# Patient Record
Sex: Male | Born: 1965 | Race: Black or African American | Hispanic: No | Marital: Single
Health system: Midwestern US, Community
[De-identification: ages and names within clinical notes are randomized; demographics above are authoritative.]

## PROBLEM LIST (undated history)

## (undated) ENCOUNTER — Emergency Department (HOSPITAL_BASED_OUTPATIENT_CLINIC_OR_DEPARTMENT_OTHER): Admission: EM | Payer: No Typology Code available for payment source | Source: Home / Self Care

## (undated) ENCOUNTER — Emergency Department (HOSPITAL_BASED_OUTPATIENT_CLINIC_OR_DEPARTMENT_OTHER): Discharge status: Absent without leave | Payer: Medicare Other

## (undated) DIAGNOSIS — I2510 Atherosclerotic heart disease of native coronary artery without angina pectoris: Principal | ICD-10-CM

## (undated) DIAGNOSIS — K56609 Unspecified intestinal obstruction, unspecified as to partial versus complete obstruction: Secondary | ICD-10-CM

## (undated) DIAGNOSIS — K604 Rectal fistula, unspecified: Secondary | ICD-10-CM

## (undated) DIAGNOSIS — K219 Gastro-esophageal reflux disease without esophagitis: Secondary | ICD-10-CM

## (undated) DIAGNOSIS — M1990 Unspecified osteoarthritis, unspecified site: Secondary | ICD-10-CM

## (undated) DIAGNOSIS — I10 Essential (primary) hypertension: Secondary | ICD-10-CM

## (undated) DIAGNOSIS — F4310 Post-traumatic stress disorder, unspecified: Secondary | ICD-10-CM

## (undated) HISTORY — PX: RECTAL SURGERY: SHX760

## (undated) MED ORDER — CIPROFLOXACIN 500 MG TAB
500 mg | ORAL_TABLET | Freq: Two times a day (BID) | ORAL | Status: AC
Start: ? — End: 2013-04-28

## (undated) MED ORDER — KETOROLAC TROMETHAMINE 10 MG TAB
10 mg | ORAL_TABLET | Freq: Four times a day (QID) | ORAL | Status: DC | PRN
Start: ? — End: 2015-04-20

---

## 2007-05-29 DIAGNOSIS — E1169 Type 2 diabetes mellitus with other specified complication: Secondary | ICD-10-CM | POA: Insufficient documentation

## 2007-05-29 DIAGNOSIS — E119 Type 2 diabetes mellitus without complications: Secondary | ICD-10-CM | POA: Insufficient documentation

## 2007-05-29 DIAGNOSIS — E11621 Type 2 diabetes mellitus with foot ulcer: Secondary | ICD-10-CM | POA: Insufficient documentation

## 2008-03-29 ENCOUNTER — Emergency Department (HOSPITAL_BASED_OUTPATIENT_CLINIC_OR_DEPARTMENT_OTHER): Admission: EM | Admit: 2008-03-29 | Discharge: 2008-03-29 | Payer: Self-pay | Admitting: Emergency Medicine

## 2010-09-18 ENCOUNTER — Emergency Department (HOSPITAL_BASED_OUTPATIENT_CLINIC_OR_DEPARTMENT_OTHER)
Admission: EM | Admit: 2010-09-18 | Discharge: 2010-09-18 | Disposition: A | Discharge status: Home / Self Care | Payer: Medicare Other | Attending: Emergency Medicine | Admitting: Emergency Medicine

## 2010-09-18 DIAGNOSIS — R350 Frequency of micturition: Secondary | ICD-10-CM | POA: Insufficient documentation

## 2010-09-18 DIAGNOSIS — I10 Essential (primary) hypertension: Secondary | ICD-10-CM | POA: Insufficient documentation

## 2010-09-18 DIAGNOSIS — F17200 Nicotine dependence, unspecified, uncomplicated: Secondary | ICD-10-CM | POA: Insufficient documentation

## 2010-09-18 DIAGNOSIS — E1169 Type 2 diabetes mellitus with other specified complication: Secondary | ICD-10-CM | POA: Insufficient documentation

## 2010-09-18 DIAGNOSIS — G8929 Other chronic pain: Secondary | ICD-10-CM | POA: Insufficient documentation

## 2010-09-18 DIAGNOSIS — E860 Dehydration: Secondary | ICD-10-CM | POA: Insufficient documentation

## 2010-09-18 LAB — BASIC METABOLIC PANEL
BUN: 19 mg/dL (ref 6–23)
Calcium: 9 mg/dL (ref 8.4–10.5)
GFR calc non Af Amer: >60 mL/min (ref >60)
Glucose, Bld: 802 mg/dL (ref 70–99)

## 2010-09-18 LAB — POCT I-STAT 3, VENOUS BLOOD GAS (G3P V)
Acid-Base Excess: 1 mmol/L (ref 0.0–2.0)
pCO2, Ven: 49.7 mmHg (ref 45.0–50.0)
pH, Ven: 7.359 — ABNORMAL HIGH (ref 7.250–7.300)
pO2, Ven: 17 mmHg — CL (ref 30.0–45.0)

## 2010-09-18 LAB — GLUCOSE, CAPILLARY
Glucose-Capillary: >600 mg/dL (ref 70–99)
Glucose-Capillary: 269 mg/dL — ABNORMAL HIGH (ref 70–99)

## 2010-09-18 LAB — URINALYSIS, ROUTINE W REFLEX MICROSCOPIC
Bilirubin Urine: NEGATIVE
Glucose, UA: >1000 mg/dL — AB
Ketones, ur: 15 mg/dL — AB
Leukocytes, UA: NEGATIVE
Nitrite: NEGATIVE
Specific Gravity, Urine: 1.029 (ref 1.005–1.030)
pH: 6.5 (ref 5.0–8.0)

## 2010-09-18 LAB — CBC
Platelets: 224 10*3/uL (ref 150–400)
WBC: 5.4 10*3/uL (ref 4.0–10.5)

## 2010-09-18 LAB — URINE MICROSCOPIC-ADD ON

## 2010-09-18 LAB — DIFFERENTIAL
Basophils Absolute: 0.1 10*3/uL (ref 0.0–0.1)
Eosinophils Absolute: 0.2 10*3/uL (ref 0.0–0.7)
Lymphs Abs: 1.5 10*3/uL (ref 0.7–4.0)
Monocytes Absolute: 1 10*3/uL (ref 0.1–1.0)
Neutrophils Relative %: 48 % (ref 43–77)

## 2011-01-27 ENCOUNTER — Inpatient Hospital Stay (HOSPITAL_COMMUNITY)
Admission: EM | Admit: 2011-01-27 | Discharge: 2011-01-30 | DRG: 390 | Disposition: A | Discharge status: Home / Self Care | Payer: Medicare Other | Attending: Surgery | Admitting: Surgery

## 2011-01-27 ENCOUNTER — Emergency Department (INDEPENDENT_AMBULATORY_CARE_PROVIDER_SITE_OTHER): Payer: Medicare Other

## 2011-01-27 ENCOUNTER — Encounter: Payer: Self-pay | Admitting: *Deleted

## 2011-01-27 ENCOUNTER — Emergency Department (HOSPITAL_BASED_OUTPATIENT_CLINIC_OR_DEPARTMENT_OTHER)
Admission: EM | Admit: 2011-01-27 | Discharge: 2011-01-27 | Disposition: A | Discharge status: Other Acute Inpatient Hospital | Payer: Medicare Other | Source: Home / Self Care | Attending: Emergency Medicine | Admitting: Emergency Medicine

## 2011-01-27 DIAGNOSIS — E860 Dehydration: Secondary | ICD-10-CM | POA: Diagnosis present

## 2011-01-27 DIAGNOSIS — R112 Nausea with vomiting, unspecified: Secondary | ICD-10-CM

## 2011-01-27 DIAGNOSIS — K56609 Unspecified intestinal obstruction, unspecified as to partial versus complete obstruction: Principal | ICD-10-CM | POA: Diagnosis present

## 2011-01-27 DIAGNOSIS — E119 Type 2 diabetes mellitus without complications: Secondary | ICD-10-CM | POA: Diagnosis present

## 2011-01-27 DIAGNOSIS — F17200 Nicotine dependence, unspecified, uncomplicated: Secondary | ICD-10-CM | POA: Diagnosis present

## 2011-01-27 DIAGNOSIS — R109 Unspecified abdominal pain: Secondary | ICD-10-CM | POA: Insufficient documentation

## 2011-01-27 DIAGNOSIS — E876 Hypokalemia: Secondary | ICD-10-CM | POA: Diagnosis present

## 2011-01-27 DIAGNOSIS — G8929 Other chronic pain: Secondary | ICD-10-CM | POA: Diagnosis present

## 2011-01-27 DIAGNOSIS — K7689 Other specified diseases of liver: Secondary | ICD-10-CM

## 2011-01-27 DIAGNOSIS — M549 Dorsalgia, unspecified: Secondary | ICD-10-CM | POA: Diagnosis present

## 2011-01-27 DIAGNOSIS — R188 Other ascites: Secondary | ICD-10-CM

## 2011-01-27 DIAGNOSIS — I10 Essential (primary) hypertension: Secondary | ICD-10-CM | POA: Diagnosis present

## 2011-01-27 DIAGNOSIS — Z79899 Other long term (current) drug therapy: Secondary | ICD-10-CM

## 2011-01-27 HISTORY — DX: Gastro-esophageal reflux disease without esophagitis: K21.9

## 2011-01-27 HISTORY — DX: Post-traumatic stress disorder, unspecified: F43.10

## 2011-01-27 HISTORY — DX: Essential (primary) hypertension: I10

## 2011-01-27 LAB — COMPREHENSIVE METABOLIC PANEL
ALT: 21 U/L (ref 0–53)
Albumin: 4.2 g/dL (ref 3.5–5.2)
Alkaline Phosphatase: 62 U/L (ref 39–117)
Potassium: 3.2 mEq/L — ABNORMAL LOW (ref 3.5–5.1)
Sodium: 134 mEq/L — ABNORMAL LOW (ref 135–145)
Total Protein: 8.4 g/dL — ABNORMAL HIGH (ref 6.0–8.3)

## 2011-01-27 LAB — CBC
MCH: 32.4 pg (ref 26.0–34.0)
MCH: 32.3 pg (ref 26.0–34.0)
MCV: 86.8 fL (ref 78.0–100.0)
Platelets: 203 10*3/uL (ref 150–400)
Platelets: 164 10*3/uL (ref 150–400)
RBC: 6.48 MIL/uL — ABNORMAL HIGH (ref 4.22–5.81)
RBC: 5.75 MIL/uL (ref 4.22–5.81)

## 2011-01-27 LAB — GLUCOSE, CAPILLARY
Glucose-Capillary: 222 mg/dL — ABNORMAL HIGH (ref 70–99)
Glucose-Capillary: 189 mg/dL — ABNORMAL HIGH (ref 70–99)
Glucose-Capillary: 144 mg/dL — ABNORMAL HIGH (ref 70–99)

## 2011-01-27 LAB — URINALYSIS, ROUTINE W REFLEX MICROSCOPIC
Bilirubin Urine: NEGATIVE
Hgb urine dipstick: NEGATIVE
Specific Gravity, Urine: >1.046 — ABNORMAL HIGH (ref 1.005–1.030)
pH: 5 (ref 5.0–8.0)

## 2011-01-27 LAB — DIFFERENTIAL
Basophils Relative: 1 % (ref 0–1)
Eosinophils Absolute: 0 10*3/uL (ref 0.0–0.7)
Eosinophils Absolute: 0 10*3/uL (ref 0.0–0.7)
Eosinophils Relative: 0 % (ref 0–5)
Lymphs Abs: 2.3 10*3/uL (ref 0.7–4.0)
Lymphs Abs: 1.4 10*3/uL (ref 0.7–4.0)
Monocytes Absolute: 0.5 10*3/uL (ref 0.1–1.0)
Neutrophils Relative %: 73 % (ref 43–77)

## 2011-01-27 LAB — LACTIC ACID, PLASMA: Lactic Acid, Venous: 3.3 mmol/L — ABNORMAL HIGH (ref 0.5–2.2)

## 2011-01-27 LAB — URINE MICROSCOPIC-ADD ON

## 2011-01-27 MED ORDER — SODIUM CHLORIDE 0.9 % IV BOLUS (SEPSIS)
1000.0000 mL | Freq: Once | INTRAVENOUS | Status: AC
  Administered 2011-01-27: 1000 mL via INTRAVENOUS

## 2011-01-27 MED ORDER — HYDROMORPHONE HCL 2 MG/ML IJ SOLN
INTRAMUSCULAR | Status: AC
  Filled 2011-01-27: qty 1

## 2011-01-27 MED ORDER — PANTOPRAZOLE SODIUM 40 MG IV SOLR
INTRAVENOUS | Status: AC
  Filled 2011-01-27: qty 40

## 2011-01-27 MED ORDER — HYDROMORPHONE HCL 1 MG/ML IJ SOLN
1.0000 mg | Freq: Once | INTRAMUSCULAR | Status: AC
  Administered 2011-01-27: 1 mg via INTRAVENOUS

## 2011-01-27 MED ORDER — IOHEXOL 300 MG/ML  SOLN
100.0000 mL | Freq: Once | INTRAMUSCULAR | Status: AC | PRN
  Administered 2011-01-27: 100 mL via INTRAVENOUS

## 2011-01-27 MED ORDER — ONDANSETRON HCL 4 MG/2ML IJ SOLN
INTRAMUSCULAR | Status: AC
  Administered 2011-01-27: 4 mg
  Filled 2011-01-27: qty 2

## 2011-01-27 MED ORDER — POTASSIUM CHLORIDE CRYS ER 20 MEQ PO TBCR
40.0000 meq | EXTENDED_RELEASE_TABLET | Freq: Once | ORAL | Status: AC
  Administered 2011-01-27: 40 meq via ORAL
  Filled 2011-01-27: qty 2

## 2011-01-27 MED ORDER — HYDROMORPHONE HCL 1 MG/ML IJ SOLN
INTRAMUSCULAR | Status: AC
  Filled 2011-01-27: qty 1

## 2011-01-27 MED ORDER — SODIUM CHLORIDE 0.9 % IV SOLN: 999.0000 mL | Freq: Once | INTRAVENOUS | Status: DC

## 2011-01-27 MED ORDER — ONDANSETRON HCL 4 MG/2ML IJ SOLN
INTRAMUSCULAR | Status: AC
  Administered 2011-01-27: 4 mg via INTRAVENOUS
  Filled 2011-01-27: qty 2

## 2011-01-27 MED ORDER — HYDROMORPHONE HCL 1 MG/ML IJ SOLN: 1.0000 mg | Freq: Once | INTRAMUSCULAR | Status: DC

## 2011-01-27 MED ORDER — FAMOTIDINE IN NACL 20-0.9 MG/50ML-% IV SOLN
20.0000 mg | Freq: Once | INTRAVENOUS | Status: AC
  Administered 2011-01-27: 20 mg via INTRAVENOUS
  Filled 2011-01-27: qty 50

## 2011-01-27 MED ORDER — HYDROMORPHONE HCL 1 MG/ML IJ SOLN
2.0000 mg | Freq: Once | INTRAMUSCULAR | Status: AC
  Administered 2011-01-27: 2 mg via INTRAVENOUS

## 2011-01-27 MED ORDER — PANTOPRAZOLE SODIUM 40 MG IV SOLR
40.0000 mg | Freq: Once | INTRAVENOUS | Status: AC
  Administered 2011-01-27: 40 mg via INTRAVENOUS

## 2011-01-27 NOTE — ED Notes (Signed)
54F NG tube was placed into right nare approx ago without difficulty. Pt was hooked to low walk suction, and returned approx clear emesis. Secured to nose.

## 2011-01-27 NOTE — ED Notes (Signed)
PT's IV is intact at time of transfer. Charted it was removed for sake of documentation purposes.

## 2011-01-27 NOTE — ED Notes (Signed)
Report was given to [facility]. They have picked up pt and are now en route to [place] [facility]. Report given to Cletis [place] Consulting civil engineer at [employer]. Pt stable at time of transfer.

## 2011-01-27 NOTE — ED Notes (Signed)
NG tube was charted "removed" for documentation purposes only. 10F NG  Tube in right nare was in fact intact at time of transfer to [facility] ED.

## 2011-01-27 NOTE — ED Provider Notes (Addendum)
History     Chief Complaint  Patient presents with  . Abdominal Pain  . Emesis   Patient is a 45 y.o. male presenting with abdominal pain and vomiting. The history is provided by the patient and the EMS personnel.  Abdominal Pain The primary symptoms of the illness include abdominal pain, nausea and vomiting. The primary symptoms of the illness do not include fever, shortness of breath, diarrhea, hematemesis, hematochezia or dysuria. Episode onset: The patient reports he had been having generalized upper abdominal discomfort for approximately 2 months, waxing and waning course but "bearable" until last night when the patient states that the pain became "unbearable".  The onset of the illness was gradual (This evening about 45 minutes prior to arrival, the patient developed nausea and vomiting in addition to his more severe pain.). The problem has been gradually worsening.  The abdominal pain began more than 2 days ago. The pain came on gradually. The abdominal pain has been gradually worsening since its onset. The abdominal pain is located in the epigastric region. The abdominal pain does not radiate. The severity of the abdominal pain is 7/10. The abdominal pain is relieved by nothing. The abdominal pain is exacerbated by vomiting and alcohol use.  Nausea began today. The nausea is associated with alcohol use and prescription drugs.  The vomiting began today. Vomiting occurs 2 to 5 times per day. The emesis contains stomach contents.  Additional symptoms associated with the illness include anorexia and heartburn. Symptoms associated with the illness do not include chills, diaphoresis, constipation, urgency, hematuria, frequency or back pain. Significant associated medical issues include diabetes. Significant associated medical issues do not include PUD, GERD, inflammatory bowel disease, gallstones, liver disease or diverticulitis.  Emesis  Associated symptoms include abdominal pain. Pertinent  negatives include no chills, no diarrhea, no fever and no headaches.    No past medical history on file.  No past surgical history on file.  No family history on file.  History  Substance Use Topics  . Smoking status: Not on file  . Smokeless tobacco: Not on file  . Alcohol Use: Not on file      Review of Systems  Constitutional: Negative for fever, chills and diaphoresis.  HENT: Negative.   Eyes: Negative.   Respiratory: Negative.  Negative for shortness of breath.   Cardiovascular: Negative.   Gastrointestinal: Positive for heartburn, nausea, vomiting, abdominal pain and anorexia. Negative for diarrhea, constipation, blood in stool, hematochezia, abdominal distention, anal bleeding, rectal pain and hematemesis.  Genitourinary: Negative for dysuria, urgency, frequency and hematuria.  Musculoskeletal: Negative.  Negative for back pain.  Skin: Negative.   Neurological: Negative.  Negative for headaches.    Physical Exam  BP 138/111  Pulse 104  Temp(Src) 98.1 F (36.7 C) (Oral)  Resp 18  SpO2 100%  Physical Exam  Nursing note and vitals reviewed. Constitutional: He is oriented to person, place, and time. He appears well-developed and well-nourished. He appears distressed.  HENT:  Head: Normocephalic and atraumatic.  Nose: Nose normal.  Mouth/Throat: Oropharynx is clear and moist.  Eyes: Conjunctivae and EOM are normal. Pupils are equal, round, and reactive to light. No scleral icterus.  Neck: Normal range of motion. Neck supple.  Cardiovascular: Normal rate, regular rhythm, normal heart sounds and intact distal pulses.  Exam reveals no gallop and no friction rub.   No murmur heard. Pulmonary/Chest: Effort normal and breath sounds normal. No respiratory distress. He has no wheezes. He has no rales. He exhibits no tenderness.  Abdominal: Soft. Normal appearance and bowel sounds are normal. He exhibits no distension, no fluid wave, no abdominal bruit, no ascites, no  pulsatile midline mass and no mass. There is no hepatosplenomegaly. There is tenderness in the epigastric area and periumbilical area. There is guarding. There is no rigidity, no rebound, no CVA tenderness, no tenderness at McBurney's point and negative Murphy's sign.  Musculoskeletal: Normal range of motion. He exhibits no edema and no tenderness.  Neurological: He is alert and oriented to person, place, and time. No cranial nerve deficit. He exhibits normal muscle tone. Coordination normal.  Skin: Skin is warm and dry. No rash noted. He is not diaphoretic. No erythema.  Psychiatric: He has a normal mood and affect.    ED Course  Procedures  MDM Gastritis, peptic ulcer, pancreatitis, cholecystitis, cholelithiasis, choledocholithiasis, biliary colic, bowel obstruction, diverticulitis, colitis, appendicitis, urinary tract infection, renal colic, ureteral lithiasis considered among other potential etiologies in the patient's differential diagnosis.  I will obtain general metabolic labs as well as hepatic function panel and lipase, urinalysis, and due to the duration and severity of his symptoms I will also obtain an abdominal CT scan with contrast. I have ordered the patient IV fluids, it and time medics, antacids, and analgesics. I will reevaluate the patient for improvement or change in symptoms.     Felisa Bonier, MD 01/27/11 [phone]  I went into the patient's room to reevaluate him, and he seems to be in much improved condition, with significant relief of his pain. We talked about his use of alcohol which his wife has informed me as excessive, nearly daily, and fairly heavy. I pointed out to him that my suspicion was that his abdominal pain was related to inflammation of the stomach and/or pancreas from chronic alcohol use and that this alcohol use has directly resulted in these symptoms today. I recommended to him to cut down on his use of alcohol. The patient stated his understanding and  appreciation of what I was saying, and tells me that he is currently getting help with his alcohol use.  Felisa Bonier, MD 01/27/11 0320  The patient's initial lab value results were reviewed and surprisingly his liver transaminases and his lipase are not significantly elevated. However too remarkable findings were a hemoglobin of 21 g/dL (I have ordered a repeat blood draw to confirm this value, as the differential is otherwise normal and does not immediately makes sense why his hemoglobin would be this high) as well as his bicarbonate being low, it and for which reason I have ordered a lactate level to evaluate for possible mesenteric ischemia which will also be evaluated by abdominal CT scan.  Felisa Bonier, MD 01/27/11 [phone]  Repeat CBC for the patient shows a declining hemoglobin towards normal range with IV fluid hydration. It appears the patient was significantly dehydrated. Additionally, if it's noted that his lactic acid is elevated at 3.3 along with a carbon dioxide level in the blood of 16. His CT scan is completed and I have reviewed along with the radiologist, and it appears that the patient has an acute bowel obstruction in the left upper quadrant with dilated loops of small bowel with bowel wall edema. I contacted Dr. Adriana Reams at [facility], the on-call general surgeon and discussed the findings on the patient's exam lab work and CT scan with him. At this time he has accepted the patient for transfer to the [facility] emergency Department where he will evaluate the  patient. I have informed the patient and his wife of the findings and diagnosis as well as the treatment plan including transfer to [facility] and he states his understanding of and agreement with this plan of care.  Felisa Bonier, MD 01/27/11 [phone]

## 2011-01-27 NOTE — ED Notes (Signed)
Pt reports sharp mid-upper abdominal pains with nausea since yesterday. Worsened approx 3hours ago. Pt admits to drinking a couple vodka and cranberry's with dinner last night.

## 2011-01-28 ENCOUNTER — Inpatient Hospital Stay (HOSPITAL_COMMUNITY): Payer: Medicare Other

## 2011-01-28 LAB — GLUCOSE, CAPILLARY
Glucose-Capillary: 131 mg/dL — ABNORMAL HIGH (ref 70–99)
Glucose-Capillary: 146 mg/dL — ABNORMAL HIGH (ref 70–99)
Glucose-Capillary: 156 mg/dL — ABNORMAL HIGH (ref 70–99)
Glucose-Capillary: 157 mg/dL — ABNORMAL HIGH (ref 70–99)
Glucose-Capillary: 176 mg/dL — ABNORMAL HIGH (ref 70–99)

## 2011-01-28 LAB — BASIC METABOLIC PANEL WITH GFR
BUN: 11 mg/dL (ref 6–23)
CO2: 28 meq/L (ref 19–32)
Calcium: 8.9 mg/dL (ref 8.4–10.5)
Chloride: 99 meq/L (ref 96–112)
Creatinine, Ser: 0.9 mg/dL (ref 0.50–1.35)
GFR calc Af Amer: >60 mL/min
GFR calc non Af Amer: >60 mL/min
Glucose, Bld: 123 mg/dL — ABNORMAL HIGH (ref 70–99)
Potassium: 3.4 meq/L — ABNORMAL LOW (ref 3.5–5.1)
Sodium: 138 meq/L (ref 135–145)

## 2011-01-28 LAB — CBC
HCT: 45.3 % (ref 39.0–52.0)
Hemoglobin: 16.5 g/dL (ref 13.0–17.0)
MCH: 33 pg (ref 26.0–34.0)
MCV: 90.6 fL (ref 78.0–100.0)
RBC: 5 MIL/uL (ref 4.22–5.81)

## 2011-01-29 ENCOUNTER — Inpatient Hospital Stay (HOSPITAL_COMMUNITY): Payer: Medicare Other

## 2011-01-29 LAB — GLUCOSE, CAPILLARY: Glucose-Capillary: 111 mg/dL — ABNORMAL HIGH (ref 70–99)

## 2011-01-30 LAB — GLUCOSE, CAPILLARY: Glucose-Capillary: 125 mg/dL — ABNORMAL HIGH (ref 70–99)

## 2011-01-31 LAB — GLUCOSE, CAPILLARY

## 2011-01-31 NOTE — Discharge Summary (Signed)
  NAME:  [name], [name] NO.:  [id number]  MEDICAL RECORD NO.:  [id number]  LOCATION:  3037                         FACILITY:  [facility]  PHYSICIAN:  Maisie Fus A. Atia Haupt, M.D.DATE OF BIRTH:  1966/04/05  DATE OF ADMISSION:  01/27/2011 DATE OF DISCHARGE:  01/30/2011                              DISCHARGE SUMMARY   ADMITTING DIAGNOSIS:  Partial small bowel obstruction discharged 01/02 hypertension.  DISCHARGE DIAGNOSES:  Same.  PROCEDURES PERFORMED:  Small bowel follow-through and CT scan of the pelvis.  BRIEF HISTORY:  The patient is a 45 year old male admitted with nausea, vomiting, and crampy abdominal pain.  Films showed small bowel obstruction.  Please see history and physical for details.  HOSPITAL COURSE:  He underwent small bowel follow-through on hospital day #1 which was essentially normal.  His bowels began to move again and his nausea resolved and his abdominal pain resolved.  His laboratory work was normal.  There was no obvious obstruction seen on small bowel follow-through with rapid contrast flow through the small bowel into the large bowel without signs of stricture.  He was feeling better and was discharged to home hospital day #3 after tolerating his diet.  He had some issues with hypertension, but this improved when we resumed his medications and got the NG tube out of him.  He should follow up with the [facility] physician.  DISCHARGE INSTRUCTIONS:  He will follow up with [facility] as needed.  I have recommended he go back to his primary care doctor who currently is the [facility] hospital for further medical management of his hypertension as well as other issues.  He would like to find primary care doctor, but I told [facility]. He may require gastroenterologist to see him as well.  At this point in time, we found no point of obstruction.  He has had no previous surgery and he has no region of adhesions of this  point in time.  His small bowel follow-through was normal.  After the discussion of the above with the patient and his wife and they understood.  I recommended a soft diet over the next 2 weeks.  He is to drink plenty of fluids, eat small meals, and do not overeat.  He will follow up with [facility] as needed.  CONDITION ON DISCHARGE:  Improved.     Taysean Wager A. Diahn Waidelich, M.D.     TAC/MEDQ  D:  01/30/2011  T:  01/30/2011  Job:  [id number]  Electronically Signed by Harriette Bouillon M.D. on 01/31/2011 12:49:03 PM
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
## 2011-03-08 ENCOUNTER — Encounter (HOSPITAL_BASED_OUTPATIENT_CLINIC_OR_DEPARTMENT_OTHER): Payer: Self-pay

## 2011-03-08 ENCOUNTER — Emergency Department (HOSPITAL_BASED_OUTPATIENT_CLINIC_OR_DEPARTMENT_OTHER)
Admission: EM | Admit: 2011-03-08 | Discharge: 2011-03-09 | Disposition: A | Discharge status: Home / Self Care | Payer: Medicare Other | Source: Home / Self Care | Attending: Emergency Medicine | Admitting: Emergency Medicine

## 2011-03-08 ENCOUNTER — Emergency Department (INDEPENDENT_AMBULATORY_CARE_PROVIDER_SITE_OTHER): Payer: Medicare Other

## 2011-03-08 DIAGNOSIS — E119 Type 2 diabetes mellitus without complications: Secondary | ICD-10-CM | POA: Insufficient documentation

## 2011-03-08 DIAGNOSIS — I10 Essential (primary) hypertension: Secondary | ICD-10-CM | POA: Insufficient documentation

## 2011-03-08 DIAGNOSIS — K6289 Other specified diseases of anus and rectum: Secondary | ICD-10-CM

## 2011-03-08 DIAGNOSIS — K612 Anorectal abscess: Secondary | ICD-10-CM

## 2011-03-08 DIAGNOSIS — K611 Rectal abscess: Secondary | ICD-10-CM

## 2011-03-08 DIAGNOSIS — Q619 Cystic kidney disease, unspecified: Secondary | ICD-10-CM

## 2011-03-08 HISTORY — DX: Unspecified intestinal obstruction, unspecified as to partial versus complete obstruction: K56.609

## 2011-03-08 HISTORY — DX: Rectal fistula, unspecified: K60.40

## 2011-03-08 HISTORY — DX: Rectal fistula: K60.4

## 2011-03-08 LAB — CBC
HCT: 39.5 % (ref 39.0–52.0)
Hemoglobin: 14.4 g/dL (ref 13.0–17.0)
MCHC: 36.5 g/dL — ABNORMAL HIGH (ref 30.0–36.0)
WBC: 7.9 10*3/uL (ref 4.0–10.5)

## 2011-03-08 LAB — COMPREHENSIVE METABOLIC PANEL
Alkaline Phosphatase: 52 U/L (ref 39–117)
BUN: 10 mg/dL (ref 6–23)
CO2: 26 mEq/L (ref 19–32)
Chloride: 99 mEq/L (ref 96–112)
GFR calc Af Amer: >60 mL/min (ref >60)
GFR calc non Af Amer: >60 mL/min (ref >60)
Glucose, Bld: 126 mg/dL — ABNORMAL HIGH (ref 70–99)
Potassium: 4 mEq/L (ref 3.5–5.1)
Total Bilirubin: 0.8 mg/dL (ref 0.3–1.2)

## 2011-03-08 LAB — URINALYSIS, ROUTINE W REFLEX MICROSCOPIC
Ketones, ur: NEGATIVE mg/dL
Leukocytes, UA: NEGATIVE
Nitrite: NEGATIVE
Protein, ur: 30 mg/dL — AB

## 2011-03-08 MED ORDER — HYDROMORPHONE HCL 1 MG/ML IJ SOLN
1.0000 mg | Freq: Once | INTRAMUSCULAR | Status: AC
  Administered 2011-03-08: 1 mg via INTRAVENOUS

## 2011-03-08 MED ORDER — IOHEXOL 300 MG/ML  SOLN
100.0000 mL | Freq: Once | INTRAMUSCULAR | Status: AC | PRN
  Administered 2011-03-08: 100 mL via INTRAVENOUS

## 2011-03-08 MED ORDER — HYDROMORPHONE HCL 1 MG/ML IJ SOLN
1.0000 mg | Freq: Once | INTRAMUSCULAR | Status: AC
  Administered 2011-03-08 (×2): 1 mg via INTRAVENOUS
  Filled 2011-03-08: qty 1

## 2011-03-08 MED ORDER — HYDROMORPHONE HCL 1 MG/ML IJ SOLN
INTRAMUSCULAR | Status: AC
  Filled 2011-03-08: qty 1

## 2011-03-08 MED ORDER — HYDROMORPHONE HCL 1 MG/ML IJ SOLN
INTRAMUSCULAR | Status: AC
  Administered 2011-03-08: 1 mg via INTRAVENOUS
  Filled 2011-03-08: qty 1

## 2011-03-08 MED ORDER — SODIUM CHLORIDE 0.9 % IV SOLN
3.0000 g | Freq: Once | INTRAVENOUS | Status: AC
  Administered 2011-03-09: 3 g via INTRAVENOUS
  Filled 2011-03-08: qty 3

## 2011-03-08 MED ORDER — HYDROMORPHONE HCL 1 MG/ML IJ SOLN: 1.0000 mg | Freq: Once | INTRAMUSCULAR | Status: DC

## 2011-03-08 NOTE — ED Provider Notes (Signed)
History     CSN: [id number] Arrival date & time: 03/08/2011  7:05 PM  Chief Complaint  Patient presents with  . Rectal Pain   The history is provided by the patient. No language interpreter was used.  Seen at the [facility] for same 2 days ago symptoms worsening.  Pain is a 10/10, sharp, constant, worsening,  non radiating.  No f/c/r.  No rectal bleeding.  No n/v/d.  Has a perirectal abscess.  No drainage.  No Associated sx.  No radiation.  No trauma.    Past Medical History  Diagnosis Date  . Hypertension   . Diabetes mellitus   . GERD (gastroesophageal reflux disease)   . PTSD (post-traumatic stress disorder)   . Fistula, perirectal   . Small bowel obstruction     History reviewed. No pertinent past surgical history.  No family history on file.  History  Substance Use Topics  . Smoking status: Former Games developer  . Smokeless tobacco: Not on file  . Alcohol Use: Yes     drinks heavily approx 5days a week.      Review of Systems  Constitutional: Negative for activity change and appetite change.  HENT: Negative for facial swelling.   Eyes: Negative for discharge.  Respiratory: Negative for apnea.   Cardiovascular: Negative for chest pain.  Gastrointestinal: Positive for rectal pain. Negative for nausea, vomiting, diarrhea, constipation, blood in stool, abdominal distention and anal bleeding.  Genitourinary: Positive for flank pain. Negative for dysuria and difficulty urinating.  Neurological: Negative for dizziness.  Hematological: Negative.   Psychiatric/Behavioral: Negative.     Physical Exam  BP 155/105  Pulse 91  Temp(Src) 98.3 F (36.8 C) (Oral)  Resp 20  Ht 5\' 9"  (1.753 m)  Wt 200 lb (90.719 kg)  BMI 29.53 kg/m2  SpO2 99%  Physical Exam  Constitutional: He is oriented to person, place, and time. He appears well-developed and well-nourished.  HENT:  Head: Normocephalic and atraumatic.  Eyes: EOM are normal. Pupils are equal, round, and reactive to light.  Neck:  Normal range of motion. Neck supple.  Cardiovascular: Normal rate and regular rhythm.   Pulmonary/Chest: Effort normal and breath sounds normal.  Abdominal: Soft. Bowel sounds are normal.       Abscess through the anal sphincter with bulge at 7-9 oclock position within the rectal vault  Musculoskeletal: Normal range of motion.  Neurological: He is alert and oriented to person, place, and time.  Skin: Skin is warm and dry.  Psychiatric: He has a normal mood and affect.    ED Course  Procedures  MDM Case d/w Dr. Silvano Rusk, antibiotics and transfer to [facility] Case d/w Dr. Brock Bad     Jasmine Awe, MD 03/09/11 [phone]

## 2011-03-08 NOTE — ED Notes (Signed)
C/o rectal pain-dx with fistula-was seen at the [place] 2 days ago

## 2011-03-09 ENCOUNTER — Observation Stay (HOSPITAL_COMMUNITY)
Admission: EM | Admit: 2011-03-09 | Discharge: 2011-03-10 | Disposition: A | Discharge status: Home / Self Care | Payer: Medicare Other | Attending: Surgery | Admitting: Surgery

## 2011-03-09 DIAGNOSIS — K612 Anorectal abscess: Principal | ICD-10-CM | POA: Insufficient documentation

## 2011-03-09 DIAGNOSIS — Z79899 Other long term (current) drug therapy: Secondary | ICD-10-CM | POA: Insufficient documentation

## 2011-03-09 DIAGNOSIS — E119 Type 2 diabetes mellitus without complications: Secondary | ICD-10-CM | POA: Insufficient documentation

## 2011-03-09 DIAGNOSIS — I10 Essential (primary) hypertension: Secondary | ICD-10-CM | POA: Insufficient documentation

## 2011-03-09 LAB — GLUCOSE, CAPILLARY
Glucose-Capillary: 121 mg/dL — ABNORMAL HIGH (ref 70–99)
Glucose-Capillary: 138 mg/dL — ABNORMAL HIGH (ref 70–99)

## 2011-03-09 MED ORDER — HYDROMORPHONE HCL 1 MG/ML IJ SOLN
1.0000 mg | Freq: Once | INTRAMUSCULAR | Status: AC
  Administered 2011-03-09: 1 mg via INTRAVENOUS
  Filled 2011-03-09: qty 1

## 2011-03-12 LAB — CULTURE, ROUTINE-ABSCESS

## 2011-03-13 NOTE — H&P (Signed)
NAME:  Christian, [name].:  [id number]  MEDICAL RECORD NO.:  [id number]  LOCATION:  [facility]                         FACILITY:  [facility]  PHYSICIAN:  Velora Heckler, MD      DATE OF BIRTH:  01/26/1966  DATE OF ADMISSION:  03/09/2011 DATE OF DISCHARGE:                             HISTORY & PHYSICAL   REFERRING PHYSICIAN:  April Palumbo-Rasch, MD, [facility].  CHIEF COMPLAINT:  Perirectal abscess.  HISTORY OF PRESENT ILLNESS:  [name] is a 45 year old black male, recently relocated from [place], [place] to [place], [place].  Patient presents with 5-day history of perianal and perirectal pain.  He has experienced sweats.  He was evaluated 2 days ago at the [facility].  He was treated for hemorrhoids.  Patient presented with continued pain at [facility] this evening.  He was evaluated and noted to have a fluctuant mass near the rectum.  CT scan of abdomen and pelvis was obtained, which confirmed a 4.8 cm abscess.  Patient was referred to General Surgery for further evaluation and management.  PAST MEDICAL HISTORY: 1. Type 2 diabetes. 2. Hypertension.  MEDICATIONS:  Metformin, glipizide, lisinopril.  ALLERGIES:  No known drug allergies.  REVIEW OF SYSTEMS:  Patient notes sleep apnea on CPAP, posttraumatic stress disorder.  FAMILY HISTORY:  Noncontributory.  SOCIAL HISTORY:  Patient recently relocated to [place].  He smokes cigarettes.  He notes moderate alcohol use.  He denies drug use.  PHYSICAL EXAMINATION:  GENERAL:  A 45 year old, well-developed, well- nourished black male on a stretcher in the emergency department, mild to moderate discomfort. VITAL SIGNS:  Temperature 98.3, pulse 91, respirations 20, blood pressure 155/105, O2 saturation 99% room air.  HEENT:  Shows him to be normocephalic.  Sclerae clear.  Dentition fair.  Mucous membranes moist. Voice normal. NECK:   Palpation of the neck shows no thyroid nodularity, no lymphadenopathy, no mass, no tenderness. LUNGS:  Clear to auscultation bilaterally without rales, rhonchi, or wheeze. CARDIAC EXAM:  Shows regular rate and rhythm without significant murmur. ABDOMEN:  Soft, protuberant, with no tenderness. GENITOURINARY EXAM:  Shows a fluctuant tender mass with induration in the posterior midline.  There is no drainage.  There is surrounding induration and early signs of cellulitis. EXTREMITIES:  Nontender without edema. NEUROLOGICAL:  Patient is alert and oriented without focal deficit.  LABORATORY STUDIES:  White count 7.9, hemoglobin 14.4, platelet count 210,000.  Glucose 126, creatinine 0.70.  Electrolytes are normal.  RADIOGRAPHIC STUDIES:  CT scan of abdomen and pelvis demonstrating a 4.8 cm perirectal abscess.  IMPRESSION: 1. Perirectal abscess. 2. Type 2 diabetes. 3. Hypertension. 4. Sleep apnea.  PLAN:  Patient will be admitted on the General Surgical Service.  He will be maintained n.p.o. in anticipation of surgery later this morning. Patient will be given intravenous pain medication as needed.  Unasyn has been started in the emergency department and will be continued.  Patient will be prepared and taken to the operating room for incision and drainage of perirectal abscess later today.  I have discussed this procedure with the patient at length.  He and his significant other understand  and agreed to proceed.     Velora Heckler, MD     TMG/MEDQ  D:  03/09/2011  T:  03/09/2011  Job:  [id number]  Electronically Signed by Darnell Level MD on 03/13/2011 09:03:08 AM

## 2011-03-16 LAB — ANAEROBIC CULTURE

## 2011-03-16 NOTE — Op Note (Signed)
  NAME:  EARNIE, BECHARD NO.:  [id number]  MEDICAL RECORD NO.:  [id number]  LOCATION:  1536                         FACILITY:  [facility]  PHYSICIAN:  Abigail Miyamoto, M.D. DATE OF BIRTH:  Feb 13, 1966  DATE OF PROCEDURE: DATE OF DISCHARGE:                              OPERATIVE REPORT   PREOPERATIVE DIAGNOSIS:  Perirectal abscess.  POSTOPERATIVE DIAGNOSIS:  Perirectal abscess.  PROCEDURE:  Incision and drainage of perirectal abscess.  SURGEON:  Shelly Rubenstein, MD  ANESTHESIA:  General and 0.50% Marcaine.  ESTIMATED BLOOD LOSS:  Minimal.  PROCEDURE IN DETAIL:  The patient was brought to the operating room, identified as Blake Divine.  He was placed supine on the operating table and general anesthesia was induced.  The patient was placed in the lithotomy position.  The obvious area of fluctuance in the abscess was almost at the posterior midline.  After prepping and draping him in the usual sterile fashion, I made an incision with a scalpel and entered the abscess cavity.  There was clear, thin fluid in the cavity itself. Cultures were obtained of the cavity.  I probed deeply into the cavity and it did not appear to extend anywhere else.  I did a circumferential inspection of the anus and found no communication in the anal cavity.  I then achieved hemostasis with cautery.  I anesthetized the area circumferentially with 0.50% Marcaine.  I then packed the wound with a 4x4 gauze.  Dry gauze and tape were then placed over this.  The patient tolerated the procedure well.  All counts were correct at the end of the procedure.  The patient was then extubated in the operating room and taken in stable condition to recovery room.     Abigail Miyamoto, M.D.     DB/MEDQ  D:  03/09/2011  T:  03/09/2011  Job:  [id number]  Electronically Signed by Abigail Miyamoto M.D. on 03/16/2011 08:24:33 AM

## 2011-03-19 NOTE — Discharge Summary (Signed)
  NAME:  [name], [name] NO.:  [id number]  MEDICAL RECORD NO.:  [id number]  LOCATION:  1536                         FACILITY:  [facility]  PHYSICIAN:  Sandria Bales. Ezzard Standing, M.D.  DATE OF BIRTH:  10/19/1965  DATE OF ADMISSION:  03/09/2011 DATE OF DISCHARGE:  03/10/2011                              DISCHARGE SUMMARY   DISCHARGE DIAGNOSES: 1. Perianal abscess. 2. Hypertension. 3. Diabetes mellitus.  OPERATION PERFORMED:  The patient underwent an incision and drainage of perianal abscess by Dr. Abigail Miyamoto, on March 09, 2011.  HISTORY OF ILLNESS:  [name] is a 45 year old African American male, who recently relocated from ___???____ [place].  The patient is on chronic disability.  He is a former post Film/video editor.  He is on disability for PTSD and back pain.    He was evaluated a couple of days prior to admission at the [facility] where he usually gets his treatment, was found to have hemorrhoids; however, because of continued pain, presented to the [facility], the evening of March 08, 2011.  He was noted to have a fluctuant mass.  The CT scan suggested a 4.8-cm perianal abscess, and he was referred to our service at [facility] where he was seen by Dr. Darnell Level.  PAST MEDICAL HISTORY:  Significant for type 2 diabetes, for which he is on metformin and glipizide.  He had hypertension, on lisinopril.  He also has sleep apnea, on a CPAP, and he has a history of posttraumatic stress disease.  He was taken to the operating room by Dr. Magnus Ivan on March 09, 2011, where he underwent incision and drainage of perianal/perirectal abscess.  He was then placed on Unasyn as antibiotic.  He is now 1 day postop, feeling better, and ready for discharge.  His incision looks good.  DISCHARGE INSTRUCTIONS:   Will include resuming his home medications.   He should be on a diabetic diet.   He is to see Dr. Magnus Ivan back in  2 weeks for wound recheck.   I have instructed him to do sitz bath three times a day.   He is given Septra DS 1 tablet twice a day as an antibiotic.  I gave him Vicodin 5/325, a total of 20 tablets with one refill for pain and his discharge condition is good.   Sandria Bales. Ezzard Standing, M.D., FAVS    DHN/MEDQ  D:  03/10/2011  T:  03/10/2011  Job:  [id number]  Electronically Signed by Ovidio Kin M.D. on 03/19/2011 11:08:36 AM

## 2011-05-30 ENCOUNTER — Emergency Department (HOSPITAL_BASED_OUTPATIENT_CLINIC_OR_DEPARTMENT_OTHER)
Admission: EM | Admit: 2011-05-30 | Discharge: 2011-05-30 | Disposition: A | Discharge status: Home / Self Care | Payer: Medicare Other | Attending: Emergency Medicine | Admitting: Emergency Medicine

## 2011-05-30 ENCOUNTER — Encounter (HOSPITAL_BASED_OUTPATIENT_CLINIC_OR_DEPARTMENT_OTHER): Payer: Self-pay | Admitting: *Deleted

## 2011-05-30 DIAGNOSIS — I10 Essential (primary) hypertension: Secondary | ICD-10-CM | POA: Insufficient documentation

## 2011-05-30 DIAGNOSIS — Z79899 Other long term (current) drug therapy: Secondary | ICD-10-CM | POA: Insufficient documentation

## 2011-05-30 DIAGNOSIS — Z8739 Personal history of other diseases of the musculoskeletal system and connective tissue: Secondary | ICD-10-CM | POA: Insufficient documentation

## 2011-05-30 DIAGNOSIS — E1169 Type 2 diabetes mellitus with other specified complication: Secondary | ICD-10-CM | POA: Insufficient documentation

## 2011-05-30 DIAGNOSIS — K219 Gastro-esophageal reflux disease without esophagitis: Secondary | ICD-10-CM | POA: Insufficient documentation

## 2011-05-30 DIAGNOSIS — R739 Hyperglycemia, unspecified: Secondary | ICD-10-CM

## 2011-05-30 HISTORY — DX: Unspecified osteoarthritis, unspecified site: M19.90

## 2011-05-30 LAB — URINALYSIS, ROUTINE W REFLEX MICROSCOPIC
Glucose, UA: >1000 mg/dL — AB
Leukocytes, UA: NEGATIVE
Nitrite: NEGATIVE
Specific Gravity, Urine: 1.026 (ref 1.005–1.030)
pH: 5 (ref 5.0–8.0)

## 2011-05-30 LAB — CBC
Hemoglobin: 15.2 g/dL (ref 13.0–17.0)
MCH: 31.5 pg (ref 26.0–34.0)
MCHC: 37.3 g/dL — ABNORMAL HIGH (ref 30.0–36.0)
MCV: 84.3 fL (ref 78.0–100.0)

## 2011-05-30 LAB — COMPREHENSIVE METABOLIC PANEL
Albumin: 4.3 g/dL (ref 3.5–5.2)
Alkaline Phosphatase: 83 U/L (ref 39–117)
BUN: 25 mg/dL — ABNORMAL HIGH (ref 6–23)
Calcium: 9.9 mg/dL (ref 8.4–10.5)
Creatinine, Ser: 1 mg/dL (ref 0.50–1.35)
GFR calc Af Amer: >90 mL/min (ref >90)
Glucose, Bld: 508 mg/dL — ABNORMAL HIGH (ref 70–99)
Potassium: 4 mEq/L (ref 3.5–5.1)
Total Protein: 8.2 g/dL (ref 6.0–8.3)

## 2011-05-30 LAB — GLUCOSE, CAPILLARY
Glucose-Capillary: 599 mg/dL (ref 70–99)
Glucose-Capillary: 372 mg/dL — ABNORMAL HIGH (ref 70–99)

## 2011-05-30 LAB — DIFFERENTIAL
Basophils Relative: 1 % (ref 0–1)
Eosinophils Absolute: 0.1 10*3/uL (ref 0.0–0.7)
Eosinophils Relative: 3 % (ref 0–5)
Monocytes Relative: 8 % (ref 3–12)
Neutrophils Relative %: 42 % — ABNORMAL LOW (ref 43–77)

## 2011-05-30 LAB — POCT I-STAT 3, ART BLOOD GAS (G3+)
Bicarbonate: 23.9 mEq/L (ref 20.0–24.0)
O2 Saturation: 97 %
TCO2: 25 mmol/L (ref 0–100)
pH, Arterial: 7.436 (ref 7.350–7.450)

## 2011-05-30 LAB — URINE MICROSCOPIC-ADD ON

## 2011-05-30 MED ORDER — SODIUM CHLORIDE 0.9 % IV SOLN: INTRAVENOUS | Status: DC

## 2011-05-30 MED ORDER — GLIPIZIDE 10 MG PO TABS: 10.0000 mg | ORAL_TABLET | Freq: Two times a day (BID) | ORAL | Status: DC

## 2011-05-30 MED ORDER — SODIUM CHLORIDE 0.9 % IV SOLN
INTRAVENOUS | Status: AC
  Administered 2011-05-30: 21:00:00 via INTRAVENOUS

## 2011-05-30 MED ORDER — DEXTROSE-NACL 5-0.45 % IV SOLN: INTRAVENOUS | Status: DC

## 2011-05-30 MED ORDER — INSULIN REGULAR BOLUS VIA INFUSION
0.0000 [IU] | Freq: Three times a day (TID) | INTRAVENOUS | Status: DC
  Filled 2011-05-30: qty 10

## 2011-05-30 MED ORDER — INSULIN REGULAR HUMAN 100 UNIT/ML IJ SOLN
10.0000 [IU] | Freq: Once | INTRAMUSCULAR | Status: AC
  Administered 2011-05-30: 10 [IU] via INTRAVENOUS
  Filled 2011-05-30: qty 3

## 2011-05-30 MED ORDER — METFORMIN HCL 1000 MG PO TABS: 1000.0000 mg | ORAL_TABLET | Freq: Two times a day (BID) | ORAL | Status: DC

## 2011-05-30 MED ORDER — DEXTROSE 50 % IV SOLN: 25.0000 mL | INTRAVENOUS | Status: DC | PRN

## 2011-05-30 MED ORDER — SODIUM CHLORIDE 0.9 % IV BOLUS (SEPSIS)
1000.0000 mL | Freq: Once | INTRAVENOUS | Status: AC
  Administered 2011-05-30: 1000 mL via INTRAVENOUS

## 2011-05-30 NOTE — ED Notes (Signed)
cbg=[id number]

## 2011-05-30 NOTE — ED Notes (Signed)
CGB 217, Dr. Ignacia Palma made aware.

## 2011-05-30 NOTE — ED Notes (Signed)
CBG 372. MD made aware. Will give 10 units IV R insulin and 1 liter NS, re-evaluate CBG in 1 hour. Hold glucose stabilizer at this time.

## 2011-05-30 NOTE — ED Provider Notes (Signed)
History     CSN: [id number] Arrival date & time: 05/30/2011  6:53 PM   First MD Initiated Contact with Patient 05/30/11 1930      Chief Complaint  Patient presents with  . Hyperglycemia    (Consider location/radiation/quality/duration/timing/severity/associated sxs/prior treatment) HPI Comments: Pt is a 45 year old man who is diabetic, taking glipizide 5 mg bid and metformin 500 mg bid, as prescribed by the [facility] in [place], [place].  His blood sugar has been reading high for some time, over a month. He has had no vomiting.  He urinates frequently, almost hourly.     Past Medical History  Diagnosis Date  . Hypertension   . Diabetes mellitus   . GERD (gastroesophageal reflux disease)   . PTSD (post-traumatic stress disorder)   . Fistula, perirectal   . Small bowel obstruction   . Arthritis     History reviewed. No pertinent past surgical history.  History reviewed. No pertinent family history.  History  Substance Use Topics  . Smoking status: Former Games developer  . Smokeless tobacco: Not on file  . Alcohol Use: Yes     drinks heavily approx 5days a week.      Review of Systems  Constitutional: Negative.   HENT: Negative.   Eyes: Negative.   Respiratory: Negative.   Cardiovascular: Negative.   Gastrointestinal: Negative.   Genitourinary: Positive for frequency.  Musculoskeletal: Negative.   Skin: Negative.   Neurological: Negative.   Psychiatric/Behavioral: Negative.     Allergies  Review of patient's allergies indicates no known allergies.  Home Medications   Current Outpatient Rx  Name Route Sig Dispense Refill  . GLIPIZIDE 5 MG PO TABS Oral Take 5 mg by mouth 2 (two) times daily before a meal.      . HYDROCODONE-ACETAMINOPHEN 5-500 MG PO TABS Oral Take 1 tablet by mouth every 6 (six) hours as needed. For pain    . LISINOPRIL-HYDROCHLOROTHIAZIDE 20-25 MG PO TABS Oral Take 1 tablet by mouth daily.      Marland Kitchen METFORMIN HCL 500 MG PO TABS Oral Take 500 mg  by mouth 2 (two) times daily with a meal.      . TRAMADOL HCL 50 MG PO TABS Oral Take 100 mg by mouth 2 (two) times daily.     Marland Kitchen BISACODYL 10 MG RE SUPP Rectal Place 10 mg rectally as needed.      . ETODOLAC PO Oral Take by mouth.      Marland Kitchen GLIPIZIDE 10 MG PO TABS Oral Take 1 tablet (10 mg total) by mouth 2 (two) times daily before a meal. 60 tablet 2  . HYDROCORTISONE ACETATE 30 MG RE SUPP Rectal Place rectally.      Marland Kitchen METFORMIN HCL 1000 MG PO TABS Oral Take 1 tablet (1,000 mg total) by mouth 2 (two) times daily with a meal. 60 tablet 2  . POLYETHYLENE GLYCOL 3350 POWD Does not apply by Does not apply route.        BP 116/71  Pulse 74  Temp(Src) 98.6 F (37 C) (Oral)  Resp 18  Ht 5\' 9"  (1.753 m)  Wt 201 lb 9.6 oz (91.445 kg)  BMI 29.77 kg/m2  SpO2 98%  Physical Exam  Nursing note and vitals reviewed. Constitutional: He is oriented to person, place, and time. He appears well-developed and well-nourished. No distress.  HENT:  Head: Normocephalic and atraumatic.  Right Ear: External ear normal.  Left Ear: External ear normal.  Mouth/Throat: Oropharynx is clear and moist.  Eyes: Conjunctivae and EOM are normal. Pupils are equal, round, and reactive to light.  Neck: Normal range of motion. Neck supple.  Cardiovascular: Normal rate, regular rhythm and normal heart sounds.   Pulmonary/Chest: Effort normal and breath sounds normal.  Abdominal: Soft. Bowel sounds are normal.  Musculoskeletal: Normal range of motion.  Neurological: He is oriented to person, place, and time.       No sensory or motor deficit.  Skin: Skin is warm and dry.  Psychiatric: He has a normal mood and affect. His behavior is normal.    ED Course  Procedures (including critical care time)  Labs Reviewed  GLUCOSE, CAPILLARY - Abnormal; Notable for the following:    Glucose-Capillary 599 (*)    All other components within normal limits  CBC - Abnormal; Notable for the following:    MCHC 37.3 (*)    All other  components within normal limits  DIFFERENTIAL - Abnormal; Notable for the following:    Neutrophils Relative 42 (*)    All other components within normal limits  COMPREHENSIVE METABOLIC PANEL - Abnormal; Notable for the following:    Sodium 124 (*)    Chloride 86 (*)    Glucose, Bld 508 (*)    BUN 25 (*)    GFR calc non Af Amer 89 (*)    All other components within normal limits  URINALYSIS, ROUTINE W REFLEX MICROSCOPIC - Abnormal; Notable for the following:    Glucose, UA >1000 (*)    All other components within normal limits  GLUCOSE, CAPILLARY - Abnormal; Notable for the following:    Glucose-Capillary 372 (*)    All other components within normal limits  GLUCOSE, CAPILLARY - Abnormal; Notable for the following:    Glucose-Capillary 219 (*)    All other components within normal limits  POCT I-STAT 3, BLOOD GAS (G3+)  URINE MICROSCOPIC-ADD ON  URINE CULTURE   No results found.  1:35 PM Results for orders placed during the hospital encounter of 05/30/11  GLUCOSE, CAPILLARY      Component Value Range   Glucose-Capillary 599 (*) 70 - 99 (mg/dL)   Comment 1 Notify RN     Comment 2 Documented in Chart    CBC      Component Value Range   WBC 4.9  4.0 - 10.5 (K/uL)   RBC 4.83  4.22 - 5.81 (MIL/uL)   Hemoglobin 15.2  13.0 - 17.0 (g/dL)   HCT 96.2  95.2 - 84.1 (%)   MCV 84.3  78.0 - 100.0 (fL)   MCH 31.5  26.0 - 34.0 (pg)   MCHC 37.3 (*) 30.0 - 36.0 (g/dL)   RDW 32.4  40.1 - 02.7 (%)   Platelets 230  150 - 400 (K/uL)  DIFFERENTIAL      Component Value Range   Neutrophils Relative 42 (*) 43 - 77 (%)   Neutro Abs 2.1  1.7 - 7.7 (K/uL)   Lymphocytes Relative 46  12 - 46 (%)   Lymphs Abs 2.2  0.7 - 4.0 (K/uL)   Monocytes Relative 8  3 - 12 (%)   Monocytes Absolute 0.4  0.1 - 1.0 (K/uL)   Eosinophils Relative 3  0 - 5 (%)   Eosinophils Absolute 0.1  0.0 - 0.7 (K/uL)   Basophils Relative 1  0 - 1 (%)   Basophils Absolute 0.1  0.0 - 0.1 (K/uL)  COMPREHENSIVE METABOLIC PANEL        Component Value Range   Sodium 124 (*)  135 - 145 (mEq/L)   Potassium 4.0  3.5 - 5.1 (mEq/L)   Chloride 86 (*) 96 - 112 (mEq/L)   CO2 25  19 - 32 (mEq/L)   Glucose, Bld 508 (*) 70 - 99 (mg/dL)   BUN 25 (*) 6 - 23 (mg/dL)   Creatinine, Ser 9.32  0.50 - 1.35 (mg/dL)   Calcium 9.9  8.4 - 35.5 (mg/dL)   Total Protein 8.2  6.0 - 8.3 (g/dL)   Albumin 4.3  3.5 - 5.2 (g/dL)   AST 15  0 - 37 (U/L)   ALT 14  0 - 53 (U/L)   Alkaline Phosphatase 83  39 - 117 (U/L)   Total Bilirubin 0.5  0.3 - 1.2 (mg/dL)   GFR calc non Af Amer 89 (*) >90 (mL/min)   GFR calc Af Amer >90  >90 (mL/min)  URINALYSIS, ROUTINE W REFLEX MICROSCOPIC      Component Value Range   Color, Urine YELLOW  YELLOW    APPearance CLEAR  CLEAR    Specific Gravity, Urine 1.026  1.005 - 1.030    pH 5.0  5.0 - 8.0    Glucose, UA >1000 (*) NEGATIVE (mg/dL)   Hgb urine dipstick NEGATIVE  NEGATIVE    Bilirubin Urine NEGATIVE  NEGATIVE    Ketones, ur NEGATIVE  NEGATIVE (mg/dL)   Protein, ur NEGATIVE  NEGATIVE (mg/dL)   Urobilinogen, UA 0.2  0.0 - 1.0 (mg/dL)   Nitrite NEGATIVE  NEGATIVE    Leukocytes, UA NEGATIVE  NEGATIVE   POCT I-STAT 3, BLOOD GAS (G3+)      Component Value Range   pH, Arterial 7.436  7.350 - 7.450    pCO2 arterial 35.6  35.0 - 45.0 (mmHg)   pO2, Arterial 89.0  80.0 - 100.0 (mmHg)   Bicarbonate 23.9  20.0 - 24.0 (mEq/L)   TCO2 25  0 - 100 (mmol/L)   O2 Saturation 97.0     Patient temperature 98.6 F     Collection site RADIAL, ALLEN'S TEST ACCEPTABLE     Drawn by Operator     Sample type ARTERIAL    URINE MICROSCOPIC-ADD ON      Component Value Range   Squamous Epithelial / LPF RARE  RARE    Bacteria, UA RARE  RARE   GLUCOSE, CAPILLARY      Component Value Range   Glucose-Capillary 372 (*) 70 - 99 (mg/dL)  GLUCOSE, CAPILLARY      Component Value Range   Glucose-Capillary 219 (*) 70 - 99 (mg/dL)   Lab tests show hyperglycemia but no DKA.  Will continue IV fluids, give IV insulin via glucose  stabilizer, and continue to observe.  1:35 PM Blood sugar down to 217 with IV rehydration and 10 units of NovoLog insulin. We will increase his glipizide to 10 mg twice a day, and his metformin to 1 g twice a day. We discussed how the metformin can cause diarrhea. We also discussed how the glipizide can bring his blood sugar down and it's important to keep food after he takes it. He should call the [place] [facility] in [place] to make a followup appointment.  1. Hyperglycemia        Carleene Cooper III, MD 05/31/11 1335

## 2011-05-30 NOTE — ED Notes (Signed)
Pt sts his glucometer has been registering "High" for the last few weeks.

## 2011-05-30 NOTE — ED Notes (Signed)
Dr. Ignacia Palma in room to evaluate pt now.

## 2011-05-31 ENCOUNTER — Encounter (HOSPITAL_BASED_OUTPATIENT_CLINIC_OR_DEPARTMENT_OTHER): Payer: Self-pay | Admitting: Emergency Medicine

## 2011-06-01 LAB — URINE CULTURE
Colony Count: 7000
Culture  Setup Time: 201211290604

## 2011-08-10 NOTE — ED Notes (Signed)
I have reviewed discharge instructions with the patient.  The patient verbalized understanding.  Spouse coming to pick up pt.

## 2011-08-10 NOTE — ED Notes (Signed)
I have reviewed discharge instructions with the patient.  The patient verbalized understanding.

## 2011-08-10 NOTE — ED Provider Notes (Signed)
Patient is a 46 y.o. male presenting with allergic reaction. The history is provided by the patient.   Allergic Reaction   This is a new problem. The current episode started yesterday. The problem has not changed since onset.Medications ingested: hair dye. Ingestion amount is unknown. Pertinent negatives include no unusual behavior, no slurred speech, no nausea, no vomiting and patient does not experience confusion. There were no other medications available. His past medical history does not include depression, psychosis, psychiatric care, suicidal ideation, bipolar disorder, suicidal plan, suicide attempts or substance abuse.        Past Medical History   Diagnosis Date   ??? Diabetes    ??? Hypertension         No past surgical history on file.      No family history on file.     History     Social History   ??? Marital Status: SINGLE     Spouse Name: N/A     Number of Children: N/A   ??? Years of Education: N/A     Occupational History   ??? Not on file.     Social History Main Topics   ??? Smoking status: Former Smoker   ??? Smokeless tobacco: Not on file   ??? Alcohol Use: No   ??? Drug Use: No   ??? Sexually Active:      Other Topics Concern   ??? Not on file     Social History Narrative   ??? No narrative on file                  ALLERGIES: Review of patient's allergies indicates no known allergies.      Review of Systems   Constitutional: Negative.  Negative for activity change.   HENT: Negative.    Eyes: Negative.    Respiratory: Negative.    Cardiovascular: Negative.    Gastrointestinal: Negative.  Negative for nausea and vomiting.   Genitourinary: Negative.    Musculoskeletal: Negative.    Skin: Positive for rash.   Neurological: Negative.    Hematological: Negative.    Psychiatric/Behavioral: Negative.  Negative for confusion and substance abuse.   All other systems reviewed and are negative.        Filed Vitals:    08/10/11 1841   BP: 162/102   Pulse: 96   Temp: 100 ??F (37.8 ??C)   Resp: 18   Height: 5\' 9"  (1.753 m)   Weight:  95.255 kg (210 lb)   SpO2: 98%            Physical Exam   Nursing note and vitals reviewed.  Constitutional: He is oriented to person, place, and time. He appears well-developed and well-nourished.   HENT:   Head: Normocephalic and atraumatic.   Right Ear: External ear normal.   Left Ear: External ear normal.   Eyes: Conjunctivae and EOM are normal. Pupils are equal, round, and reactive to light.   Neck: Normal range of motion. Neck supple.   Cardiovascular: Normal rate, regular rhythm and intact distal pulses.    Pulmonary/Chest: Effort normal and breath sounds normal.   Abdominal: Soft. Bowel sounds are normal.   Musculoskeletal: Normal range of motion.   Neurological: He is alert and oriented to person, place, and time. No cranial nerve deficit.   Skin: Skin is warm and dry. There is erythema.        Psychiatric: He has a normal mood and affect.  MDM     Risk of Significant Complications, Morbidity, and/or Mortality:   Presenting problems:  Low  Diagnostic procedures:  Low  Management options:  Low  Progress:   Patient progress:  Stable      Procedures

## 2011-08-11 MED ORDER — HYDROCODONE-ACETAMINOPHEN 5 MG-500 MG TAB
5-500 mg | ORAL_TABLET | ORAL | Status: AC | PRN
Start: 2011-08-11 — End: 2011-08-17

## 2011-08-11 MED ORDER — METHYLPREDNISOLONE (PF) 125 MG/2 ML IJ SOLR
125 mg/2 mL | Freq: Four times a day (QID) | INTRAMUSCULAR | Status: DC
Start: 2011-08-11 — End: 2011-08-10

## 2011-08-11 MED ORDER — DIPHENHYDRAMINE 25 MG CAP
25 mg | ORAL_CAPSULE | Freq: Four times a day (QID) | ORAL | Status: AC | PRN
Start: 2011-08-11 — End: 2011-08-20

## 2011-08-11 MED ORDER — ONDANSETRON 8 MG TAB, RAPID DISSOLVE
8 mg | ORAL | Status: AC
Start: 2011-08-11 — End: 2011-08-10
  Administered 2011-08-11: 01:00:00 via ORAL

## 2011-08-11 MED ORDER — HYDROMORPHONE (PF) 1 MG/ML IJ SOLN
1 mg/mL | INTRAMUSCULAR | Status: AC
Start: 2011-08-11 — End: 2011-08-10
  Administered 2011-08-11: 01:00:00 via INTRAMUSCULAR

## 2011-08-11 MED ORDER — HYDROXYZINE HCL 50 MG/ML IM SOLN
50 mg/mL | INTRAMUSCULAR | Status: AC
Start: 2011-08-11 — End: 2011-08-10
  Administered 2011-08-11: 01:00:00 via INTRAMUSCULAR

## 2011-08-11 MED ORDER — METHYLPREDNISOLONE 4 MG TABS IN A DOSE PACK
4 mg | PACK | ORAL | Status: DC
Start: 2011-08-11 — End: 2011-08-25

## 2011-08-11 MED ORDER — METHYLPREDNISOLONE (PF) 125 MG/2 ML IJ SOLR
125 mg/2 mL | Freq: Once | INTRAMUSCULAR | Status: AC
Start: 2011-08-11 — End: 2011-08-10
  Administered 2011-08-11: 01:00:00 via INTRAMUSCULAR

## 2011-08-11 MED FILL — ONDANSETRON 8 MG TAB, RAPID DISSOLVE: 8 mg | ORAL | Qty: 1

## 2011-08-11 MED FILL — SOLU-MEDROL (PF) 125 MG/2 ML SOLUTION FOR INJECTION: 125 mg/2 mL | INTRAMUSCULAR | Qty: 2

## 2011-08-11 MED FILL — HYDROXYZINE HCL 50 MG/ML IM SOLN: 50 mg/mL | INTRAMUSCULAR | Qty: 1

## 2011-08-11 MED FILL — HYDROMORPHONE (PF) 1 MG/ML IJ SOLN: 1 mg/mL | INTRAMUSCULAR | Qty: 1

## 2011-08-23 NOTE — ED Notes (Signed)
IV NS began at 125 ml/hr via plum pump

## 2011-08-24 ENCOUNTER — Inpatient Hospital Stay
Admit: 2011-08-24 | Discharge: 2011-08-25 | Disposition: A | Discharge status: Home / Self Care | Source: Home / Self Care | Attending: Internal Medicine | Admitting: Internal Medicine

## 2011-08-24 DIAGNOSIS — R739 Hyperglycemia, unspecified: Secondary | ICD-10-CM

## 2011-08-24 LAB — MRSA SCREEN - PCR (NASAL)

## 2011-08-24 LAB — METABOLIC PANEL, BASIC
Anion gap: 11 mmol/L (ref 7–16)
Anion gap: 12 mmol/L (ref 7–16)
BUN: 25 MG/DL — ABNORMAL HIGH (ref 6–23)
BUN: 24 MG/DL — ABNORMAL HIGH (ref 6–23)
CO2: 26 MMOL/L (ref 21–32)
CO2: 23 MMOL/L (ref 21–32)
Calcium: 9.8 MG/DL (ref 8.3–10.4)
Calcium: 9 MG/DL (ref 8.3–10.4)
Chloride: 87 MMOL/L — ABNORMAL LOW (ref 98–107)
Chloride: 91 MMOL/L — ABNORMAL LOW (ref 98–107)
Creatinine: 1.5 MG/DL (ref 0.8–1.5)
Creatinine: 1.2 MG/DL (ref 0.8–1.5)
GFR est AA: >60 mL/min/{1.73_m2} (ref >60)
GFR est AA: >60 mL/min/{1.73_m2} (ref >60)
GFR est non-AA: 54 mL/min/{1.73_m2} — ABNORMAL LOW (ref >60)
GFR est non-AA: >60 mL/min/{1.73_m2} (ref >60)
Glucose: 606 MG/DL — CR (ref 65–100)
Glucose: 441 MG/DL — ABNORMAL HIGH (ref 65–100)
Potassium: 4.4 MMOL/L (ref 3.5–5.1)
Potassium: 3.9 MMOL/L (ref 3.5–5.1)
Sodium: 124 MMOL/L — CL (ref 136–145)
Sodium: 126 MMOL/L — ABNORMAL LOW (ref 136–145)

## 2011-08-24 LAB — CBC WITH AUTOMATED DIFF
ABS. BASOPHILS: 0.1 10*3/uL (ref 0.0–0.2)
ABS. EOSINOPHILS: 0.1 10*3/uL (ref 0.0–0.8)
ABS. LYMPHOCYTES: 2 10*3/uL (ref 0.5–4.6)
ABS. MONOCYTES: 0.5 10*3/uL (ref 0.1–1.3)
ABS. NEUTROPHILS: 4.9 10*3/uL (ref 1.7–8.2)
BASOPHILS: 1 % (ref 0.0–2.0)
EOSINOPHILS: 1 % (ref 0.5–7.8)
HCT: 45.5 % (ref 41.1–50.3)
HGB: 16.9 g/dL (ref 13.2–17.1)
LYMPHOCYTES: 26 % (ref 13–44)
MCH: 31.1 PG (ref 26.1–32.9)
MCHC: 37.1 g/dL — ABNORMAL HIGH (ref 31.4–35.0)
MCV: 83.8 FL (ref 79.6–97.8)
MONOCYTES: 6 % (ref 4.0–12.0)
MPV: 9.8 FL — ABNORMAL LOW (ref 10.8–14.1)
NEUTROPHILS: 66 % (ref 43–78)
PLATELET COMMENTS: ADEQUATE
PLATELET: 220 10*3/uL (ref 150–450)
RBC: 5.43 M/uL (ref 4.23–5.67)
RDW: 12.1 % (ref 11.9–14.6)
WBC: 7.6 10*3/uL (ref 4.3–11.1)

## 2011-08-24 LAB — GLUCOSE, POC
Glucose (POC): >600 mg/dL — CR (ref 65–100)
Glucose (POC): 517 mg/dL — CR (ref 65–100)
Glucose (POC): 387 mg/dL — ABNORMAL HIGH (ref 65–100)
Glucose (POC): 435 mg/dL — ABNORMAL HIGH (ref 65–100)
Glucose (POC): 436 mg/dL — ABNORMAL HIGH (ref 65–100)
Glucose (POC): 335 mg/dL — ABNORMAL HIGH (ref 65–100)
Glucose (POC): 242 mg/dL — ABNORMAL HIGH (ref 65–100)
Glucose (POC): 148 mg/dL — ABNORMAL HIGH (ref 65–100)
Glucose (POC): 193 mg/dL — ABNORMAL HIGH (ref 65–100)
Glucose (POC): 220 mg/dL — ABNORMAL HIGH (ref 65–100)
Glucose (POC): 208 mg/dL — ABNORMAL HIGH (ref 65–100)
Glucose (POC): 252 mg/dL — ABNORMAL HIGH (ref 65–100)
Glucose (POC): 288 mg/dL — ABNORMAL HIGH (ref 65–100)

## 2011-08-24 LAB — HEMOGLOBIN A1C WITH EAG: Hemoglobin A1c: 10.8 % — ABNORMAL HIGH (ref 4.8–6.0)

## 2011-08-24 MED ORDER — SODIUM CHLORIDE 0.9 % IV
INTRAVENOUS | Status: DC
Start: 2011-08-24 — End: 2011-08-25
  Administered 2011-08-24 – 2011-08-25 (×2): via INTRAVENOUS

## 2011-08-24 MED ORDER — INSULIN REGULAR HUMAN 100 UNIT/ML INJECTION
100 unit/mL | INTRAMUSCULAR | Status: AC
Start: 2011-08-24 — End: 2011-08-24
  Administered 2011-08-24: 06:00:00 via INTRAVENOUS

## 2011-08-24 MED ADMIN — famotidine (PEPCID) tablet 20 mg: ORAL | @ 10:00:00 | NDC 68084017211

## 2011-08-24 MED ADMIN — metFORMIN (GLUCOPHAGE) tablet 1,000 mg: ORAL | @ 14:00:00 | NDC 68462015901

## 2011-08-24 MED ADMIN — insulin lispro (HUMALOG) injection: SUBCUTANEOUS | @ 18:00:00 | NDC 00002751017

## 2011-08-24 MED ADMIN — glipiZIDE (GLUCOTROL) tablet 10 mg: ORAL | @ 22:00:00 | NDC 51079081001

## 2011-08-24 MED ADMIN — 0.9% sodium chloride infusion: INTRAVENOUS | @ 08:00:00 | NDC 87701099893

## 2011-08-24 MED ADMIN — insulin regular (NOVOLIN, HUMULIN) 100 Units in 0.9% sodium chloride 100 mL infusion: INTRAVENOUS | @ 14:00:00 | NDC 00002821201

## 2011-08-24 MED ADMIN — metFORMIN (GLUCOPHAGE) tablet 1,000 mg: ORAL | @ 22:00:00 | NDC 68462015901

## 2011-08-24 MED ADMIN — insulin regular (NOVOLIN, HUMULIN) 100 Units in 0.9% sodium chloride 100 mL infusion: INTRAVENOUS | @ 13:00:00 | NDC 00002821201

## 2011-08-24 MED ADMIN — pantoprazole (PROTONIX) tablet 40 mg: ORAL | @ 06:00:00 | NDC 00008060704

## 2011-08-24 MED ADMIN — insulin regular (NOVOLIN, HUMULIN) 100 Units in 0.9% sodium chloride 100 mL infusion: INTRAVENOUS | @ 09:00:00 | NDC 09999183301

## 2011-08-24 MED ADMIN — heparin (porcine) injection 5,000 Units: SUBCUTANEOUS | @ 14:00:00 | NDC 25021040201

## 2011-08-24 MED ADMIN — insulin lispro (HUMALOG) injection: SUBCUTANEOUS | @ 22:00:00 | NDC 99999192301

## 2011-08-24 MED ADMIN — insulin regular (NOVOLIN, HUMULIN) 100 Units in 0.9% sodium chloride 100 mL infusion: INTRAVENOUS | @ 10:00:00 | NDC 00002821201

## 2011-08-24 MED ADMIN — insulin regular (NOVOLIN, HUMULIN) 100 Units in 0.9% sodium chloride 100 mL infusion: INTRAVENOUS | @ 12:00:00 | NDC 00002821201

## 2011-08-24 MED ADMIN — glipiZIDE (GLUCOTROL) tablet 10 mg: ORAL | @ 14:00:00 | NDC 51079081001

## 2011-08-24 MED ADMIN — heparin (porcine) injection 5,000 Units: SUBCUTANEOUS | @ 06:00:00 | NDC 25021040201

## 2011-08-24 MED FILL — HEPARIN (PORCINE) 5,000 UNIT/ML IJ SOLN: 5000 unit/mL | INTRAMUSCULAR | Qty: 1

## 2011-08-24 MED FILL — METFORMIN 500 MG TAB: 500 mg | ORAL | Qty: 2

## 2011-08-24 MED FILL — GLIPIZIDE 5 MG TAB: 5 mg | ORAL | Qty: 2

## 2011-08-24 MED FILL — PROTONIX 40 MG TABLET,DELAYED RELEASE: 40 mg | ORAL | Qty: 1

## 2011-08-24 MED FILL — SODIUM CHLORIDE 0.9 % IV: INTRAVENOUS | Qty: 1000

## 2011-08-24 MED FILL — INSULIN REGULAR HUMAN 100 UNIT/ML INJECTION: 100 unit/mL | INTRAMUSCULAR | Qty: 1

## 2011-08-24 MED FILL — FAMOTIDINE 20 MG TAB: 20 mg | ORAL | Qty: 1

## 2011-08-24 MED FILL — NOVOLIN R REGULAR U-100 INSULIN 100 UNIT/ML INJECTION SOLUTION: 100 unit/mL | INTRAMUSCULAR | Qty: 10

## 2011-08-24 NOTE — ED Notes (Signed)
Patient to ICU via wheelchair.

## 2011-08-24 NOTE — ED Notes (Signed)
Patient ambulated to restroom without difficulty.

## 2011-08-24 NOTE — ED Provider Notes (Addendum)
Patient is a 46 y.o. male presenting with hyperglycemia. The history is provided by the patient.   High Blood Sugar   This is a new problem. The problem occurs constantly. The problem has been gradually worsening. Associated with: Pt was seen in ER for al\\n allergic reaction to hair dye and was given a dose of IV steroids. The patient is experiencing no pain. Associated symptoms include frequency. Pertinent negatives include no fever, no diarrhea, no nausea, no vomiting, no dysuria, no headaches, no chest pain and no back pain. Nothing worsens the pain. The pain is relieved by nothing. His past medical history is significant for DM.        Past Medical History   Diagnosis Date   ??? Diabetes    ??? Hypertension         No past surgical history on file.      No family history on file.     History     Social History   ??? Marital Status: SINGLE     Spouse Name: N/A     Number of Children: N/A   ??? Years of Education: N/A     Occupational History   ??? Not on file.     Social History Main Topics   ??? Smoking status: Former Smoker   ??? Smokeless tobacco: Not on file   ??? Alcohol Use: No   ??? Drug Use: No   ??? Sexually Active:      Other Topics Concern   ??? Not on file     Social History Narrative   ??? No narrative on file                  ALLERGIES: Review of patient's allergies indicates no known allergies.      Review of Systems   Constitutional: Negative for fever.   HENT: Negative for neck pain.    Eyes: Negative.    Respiratory: Negative for shortness of breath.    Cardiovascular: Negative for chest pain.   Gastrointestinal: Negative for nausea, vomiting, abdominal pain and diarrhea.   Genitourinary: Positive for frequency. Negative for dysuria.   Musculoskeletal: Negative for back pain.   Skin: Negative for rash.   Neurological: Negative for headaches.   All other systems reviewed and are negative.        Filed Vitals:    08/23/11 2255 08/23/11 2334 08/24/11 0000 08/24/11 0032   BP: 146/84 135/92 136/90 142/91   Pulse: 108       Temp: 98.4 ??F (36.9 ??C)      Resp: 20      Height: 5\' 9"  (1.753 m)      Weight: 90.719 kg (200 lb)      SpO2: 99% 94% 96% 97%            Physical Exam   Nursing note and vitals reviewed.  Constitutional: He is oriented to person, place, and time. He appears well-developed and well-nourished.   HENT:   Head: Normocephalic and atraumatic.   Mouth/Throat: Oropharynx is clear and moist.   Eyes: EOM are normal. Pupils are equal, round, and reactive to light.   Neck: Normal range of motion.   Cardiovascular: Normal rate, regular rhythm and normal heart sounds.    Pulmonary/Chest: Effort normal and breath sounds normal.   Abdominal: Bowel sounds are normal.   Musculoskeletal: Normal range of motion.   Neurological: He is alert and oriented to person, place, and time. No cranial nerve deficit.   Skin:  Skin is warm. No rash noted.        MDM     Differential Diagnosis; Clinical Impression; Plan:     Hyperglycemia/ DKA/ HHNK/ Dehydration/ NIDDM/ Electrolyte ABnl/ Hyponatremia  Amount and/or Complexity of Data Reviewed:   Clinical lab tests:  Ordered and reviewed   Discuss the patient with another provider:  Yes (Discussed case with Hospitalist will admit)      Procedures    Discussed with pt all results as well as need for admission.

## 2011-08-24 NOTE — Progress Notes (Signed)
[facility].

## 2011-08-24 NOTE — Progress Notes (Signed)
Patient assisted OOB to perform self care.  Instructed to call for assistance.

## 2011-08-24 NOTE — Progress Notes (Signed)
Pt with c/o heartburn.  Order from DrKuuseg to give Pepcid.  Clarification obtained that this med is in addition to Protonix, rather than in place of it.

## 2011-08-24 NOTE — ED Notes (Signed)
TRANSFER - OUT REPORT:    Verbal report given to Aristes, RN on [name] [name]  being transferred to 375 for routine progression of care       Report consisted of patient???s Situation, Background, Assessment and   Recommendations(SBAR).     Information from the following report(s) ED Summary was reviewed with the receiving nurse.    Opportunity for questions and clarification was provided.

## 2011-08-24 NOTE — Progress Notes (Signed)
Spiritual Care Assessment/Progress Notes    [name] [name] [id number]  [id number]    1965/08/19  46 y.o.  male    Patient Telephone Number: [phone] (home)   Religious Affiliation: Unknown   Language: English   Extended Emergency Contact Information  Primary Emergency Contact: None Per Pt   [place]  Relation: None   Patient Active Problem List   Diagnoses Date Noted   ??? Hyperglycemia, drug-induced 08/24/2011   ??? 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  [x]          Not able to assess due to patient sleeping.    Services Provided Today:  []          crisis intervention    []          reading Scriptures  [x]          spiritual assessment    []          prayer  []          empathic listening/emotional support  []          rites and rituals (cite in comments)  []          life review     []          religious support  []          theological development   []          advocacy  []          ethical dialog     []          blessing  []          bereavement support    []          support to family  []          anticipatory grief support   []          help with AMD  []          spiritual guidance    []          meditation      Spiritual Care Needs  []          Emotional Support  []          Spiritual/Religious Care  []          Loss/Adjustment  []          Advocacy/Referral /Ethics  []          No needs expressed at this time  []          Other: (note in comments)  Spiritual Care Plan  []          Follow up visits with pt/family   []   Provide materials  []          Schedule sacraments  []          Contact [facility]  [x]          Follow up as needed  []          Other: (note in comments)     Comments: Patient appeared to be sleeping. According to patient's chart [name] [name] has advance directives on file, no code status listed, and no emergency contact.      Chaplain [name] [name], MDiv, [facility]

## 2011-08-24 NOTE — ED Notes (Signed)
Call from [facility] that pt to go to 375 ICU due to insulin drip ordered. Dr Beverlee Nims notified, and verbal order to place ICU bed order. Dr Beverlee Nims also gave nsg misc order that pt does not need to be on monitor for transport.

## 2011-08-24 NOTE — Progress Notes (Signed)
Ambulating in room steadily. States "Glad to get some exercise".  Pt states understanding that he needs to begin exercise regimen of walking atleast 30 mins/day x 5 days a week to help lower his A1C.

## 2011-08-24 NOTE — Progress Notes (Signed)
TRANSFER - IN REPORT:    Verbal report received from [facility]) on [facility] [facility]  being received from ED(unit) for routine progression of care      Report consisted of patient???s Situation, Background, Assessment and   Recommendations(SBAR).     Information from the following report(s) SBAR, Kardex, ED Summary, Intake/Output, MAR and Recent Results was reviewed with the receiving nurse.    Opportunity for questions and clarification was provided.      Assessment completed upon patient???s arrival to unit and care assumed.  Awaiting Insulin gtt from [place], [place] aware. Dual skin assessemet with [facility] shows no skin breakdown or bruises.  Pt eating [place] sandwich and diet soda. Current glucose improved to 387 from IV Insulin given in ER.

## 2011-08-24 NOTE — H&P (Signed)
[facility]                           [address]                            [place], [place] [address]                                [phone]                              HISTORY AND PHYSICAL    NAME:  [name] [name]                            MR:  [id number]  LOC:  WER                   SEX:  Gillermina Hu:  [id number]  DOB:  1966-04-19            AGE:  46              PT:  E  ADMIT:  08/23/2011          DSCH:                 MSV:  EMR      PRIMARY CARE PHYSICIAN: [facility] [place] [place] [facility]    ADMITTING DIAGNOSES  1. Steroid-induced hyperglycemia, serum glucose above 600.  2. Acute kidney injury from osmotic diuresis.    HISTORY OF PRESENT ILLNESS: The patient is a 46 year old African American  male with diabetes and hypertension, who recently developed an allergic reaction   to hair dye. He was seen in the ER on 08/30/2011 and given Solu-Medrol injection as   well as a tapering dose of steroids for a week.      For the past 2 weeks, he has had polyuria and polydipsia and has become gradually very weak.   He, therefore, decided to present to the emergency room.Point of care glucose done was more   than 600. BMP done showed elevated glucose of 606 and creatinine 1.5. His serum sodium was   low at 124. The patient was started on IV fluids as well as insulin, and the hospitalist service   consulted to admit the patient for further management.    REVIEW OF SYSTEMS: His 10-point review of system is negative except for  polyuria, polydipsia and generalized weakness as stated in the HPI.    PAST MEDICAL HISTORY  1. Diabetes.  2. Hypertension.  3. Obesity.    PAST SURGICAL HISTORY: Nil of note.    FAMILY HISTORY: Has been reviewed and notable for  1. Hypertension.  2. Diabetes.    SOCIAL HISTORY: He is an ex-smoker. He drinks alcohol occasionally.    MEDICATIONS: Have been reviewed and reconciled in the electronic medical  record including  1. Glucotrol 10 mg  twice a day.  2. Metformin 1000 mg twice a day.  3. Prinzide 10/12.5 one tablet daily.    ALLERGIES: HE HAS NO KNOWN DRUG ALLERGIES.    PHYSICAL EXAMINATION  VITAL SIGNS: He is afebrile, temperature 98.4. He is tachycardic, heart  rate  108. Respiratory rate is 20, blood pressure 146/84. Oxygen  saturation 99% on room air. He weighs 200 pounds.  GENERAL: He is a very pleasant middle-aged man. He is awake, alert, not  in any obvious distress.  HEENT: Normocephalic, atraumatic head. Extraocular muscles are intact.  NECK: Supple, no JVD or thyromegaly.  PULMONARY: Air entry is adequate bilaterally. No wheeze, no crackles.  CARDIOVASCULAR: Heart sounds 1 and 2 present, normal, no murmurs.  ABDOMEN: Full, soft, bowel sounds were present. Difficult to assess  organomegaly.  EXTREMITIES: No edema or cyanosis.  SKIN: No ulcers or rashes noted.  NEUROLOGIC: Exam grossly intact, nonfocal.    LABORATORY DATA: WBC is normal at 7.6, hemoglobin 16.9, hematocrit 46,  MCV 84, platelets 220. Electrolytes: Sodium is low at 134, likely pseudohyponatremia   from the hyperglycemia. Potassium was normal at 4.4, chloride 87, bicarbonate 26,   BUN is elevated at 25, creatinine also elevated at 1.5, glucose is elevated at 606.   Calcium was normal at 9.8. UA done showed only trace ketones.    ASSESSMENT: This is a 46 year old male with diabetes, recently treated with steroids   for allergic reaction to hair dye, now presents with polyuria, polydipsia, and found to have   hyperglycemia. He is also noted to have acute kidney injury, likely from osmotic diuresis.    PLAN  1. Hyperglycemia: The patient will be given IV fluids as well as insulin and will closely monitor.       He will continue on Glucotrol and also metformin once his creatinine trends down to less       than 1.0.    2. Hypertension, stable: Patient will continue current medication.    3. Diet: The patient will be on a heart healthy/diabetic diet.    4. Prophylaxis: The patient  will be on heparin and Protonix.    DISPOSITION: The patient will be admitted to the medical floor for inpatient management.    CODE STATUS: Has been discussed and the patient is a FULL CODE.    TIME SPENT: 55 minutes.                    Epifanio Lesches, MD                This is an unverified document unless signed by physician.    TID:  [id number]              DIC ID:  [id number]          DT:  08/24/2011  2:02 A  JOB:  [id number]        DOC#:  [id number]           DD:  08/24/2011     cc:  Epifanio Lesches, MD

## 2011-08-24 NOTE — ED Notes (Signed)
BGL recheck 517

## 2011-08-24 NOTE — Progress Notes (Signed)
Bedside and verbal report given to Velna Hatchet, [place].  Patient resting comfortably.

## 2011-08-25 LAB — METABOLIC PANEL, BASIC
Anion gap: 9 mmol/L (ref 7–16)
BUN: 15 MG/DL (ref 6–23)
CO2: 25 MMOL/L (ref 21–32)
Calcium: 8.4 MG/DL (ref 8.3–10.4)
Chloride: 96 MMOL/L — ABNORMAL LOW (ref 98–107)
Creatinine: 1.2 MG/DL (ref 0.8–1.5)
GFR est AA: >60 mL/min/{1.73_m2} (ref >60)
GFR est non-AA: >60 mL/min/{1.73_m2} (ref >60)
Glucose: 316 MG/DL — ABNORMAL HIGH (ref 65–100)
Potassium: 4 MMOL/L (ref 3.5–5.1)
Sodium: 130 MMOL/L — ABNORMAL LOW (ref 136–145)

## 2011-08-25 LAB — GLUCOSE, POC: Glucose (POC): 329 mg/dL — ABNORMAL HIGH (ref 65–100)

## 2011-08-25 LAB — MAGNESIUM: Magnesium: 1.7 MG/DL — ABNORMAL LOW (ref 1.8–2.4)

## 2011-08-25 MED ORDER — LISINOPRIL 10 MG TAB
10 mg | ORAL_TABLET | Freq: Every day | ORAL | Status: DC
Start: 2011-08-25 — End: 2013-04-21

## 2011-08-25 MED ADMIN — insulin lispro (HUMALOG) injection: SUBCUTANEOUS | @ 02:00:00 | NDC 00002751017

## 2011-08-25 MED ADMIN — heparin (porcine) injection 5,000 Units: SUBCUTANEOUS | @ 02:00:00 | NDC 25021040201

## 2011-08-25 MED FILL — HEPARIN (PORCINE) 5,000 UNIT/ML IJ SOLN: 5000 unit/mL | INTRAMUSCULAR | Qty: 1

## 2011-08-25 NOTE — Progress Notes (Addendum)
Subjective:   Daily Progress Note: 08/25/2011 6:29 AM    Comfortable. SBP 114. States that he is not compliant with his DM meds. No CP. No SOB.     Objective:     Vitals  BP 156/85   Pulse 84   Temp 98.3 ??F (36.8 ??C)   Resp 22   Ht 5\' 9"  (1.753 m)   Wt 90.719 kg (200 lb)   BMI 29.53 kg/m2   SpO2 98%   O2 Device: Room air    Temp (24hrs), Avg:98.3 ??F (36.8 ??C), Min:98.3 ??F (36.8 ??C), Max:98.3 ??F (36.8 ??C)      I/O  02/22 1900 - 02/23 0659  In: 1877 [P.O.:475; I.V.:1402]  Out: 850 [Urine:850]  02/21 0700 - 02/22 1859  In: 3016.6 [P.O.:1170; I.V.:1846.6]  Out: 1500 [Urine:1500]    Exam  General appearance: alert, cooperative, no distress, appears stated age  Head: atraumatic  Neck: no JVD  Lungs: clear to auscultation bilaterally  Heart: regular rate and rhythm, S1, S2 normal, no murmur, click, rub or gallop  Abdomen: soft, non-tender. Bowel sounds normal. No masses,  no organomegaly  Extremities: extremities normal, atraumatic, no cyanosis or edema  Skin: Skin color, texture, turgor normal. No rashes or lesions  Neurologic: Grossly normal    Additional comments:I reviewed the patient's new clinical lab test results.     Data Review (Labs)  Recent Results (from the past 24 hour(s))   GLUCOSE, POC    Collection Time    08/24/11  6:39 AM       Component Value Range    POC GLUCOSE 335 (*) 65 - 100 mg/dL   GLUCOSE, POC    Collection Time    08/24/11  7:50 AM       Component Value Range    POC GLUCOSE 242 (*) 65 - 100 mg/dL   GLUCOSE, POC    Collection Time    08/24/11  8:35 AM       Component Value Range    POC GLUCOSE 148 (*) 65 - 100 mg/dL   GLUCOSE, POC    Collection Time    08/24/11  9:39 AM       Component Value Range    POC GLUCOSE 193 (*) 65 - 100 mg/dL   GLUCOSE, POC    Collection Time    08/24/11 10:44 AM       Component Value Range    POC GLUCOSE 220 (*) 65 - 100 mg/dL   GLUCOSE, POC    Collection Time    08/24/11 11:32 AM       Component Value Range    POC GLUCOSE 208 (*) 65 - 100 mg/dL   GLUCOSE, POC    Collection  Time    08/24/11 12:33 PM       Component Value Range    POC GLUCOSE 252 (*) 65 - 100 mg/dL   GLUCOSE, POC    Collection Time    08/24/11  5:04 PM       Component Value Range    POC GLUCOSE 288 (*) 65 - 100 mg/dL   GLUCOSE, POC    Collection Time    08/24/11  9:24 PM       Component Value Range    POC GLUCOSE 329 (*) 65 - 100 mg/dL   METABOLIC PANEL, BASIC    Collection Time    08/25/11  4:20 AM       Component Value Range    Sodium 130 (*) 136 -  145 MMOL/L    Potassium 4.0  3.5 - 5.1 MMOL/L    Chloride 96 (*) 98 - 107 MMOL/L    CO2 25  21 - 32 MMOL/L    Anion gap 9  7 - 16 mmol/L    Glucose 316 (*) 65 - 100 MG/DL    BUN 15  6 - 23 MG/DL    Creatinine 5.40  0.8 - 1.5 MG/DL    GFR est AA >98  >11 ml/min/1.51m2    GFR est non-AA >60  >60 ml/min/1.52m2    Calcium 8.4  8.3 - 10.4 MG/DL   MAGNESIUM    Collection Time    08/25/11  4:20 AM       Component Value Range    Magnesium 1.7 (*) 1.8 - 2.4 MG/DL       Assessment/Plan:     Principal Problem:   *Hyperglycemia, drug-induced (08/24/2011)  Active Problems:     AKI (acute kidney injury) (08/24/2011):resolved  DM2: not consistently compliant with meds. Educated. States that he will get a PMD. He currently goes to the [place].   HTN: will give prescription. Has hyponatremic with HCTZ.       DC today.        Care Plan discussed with: Patient/Family and Nurse

## 2011-08-25 NOTE — Progress Notes (Signed)
Pt wishes to leave as soon as possible, discharge process completed.

## 2011-08-25 NOTE — Discharge Summary (Signed)
Hospitalist Discharge Summary     Patient ID:  [name] [name]  [id number]  46 y.o.  02-Sep-1965    Admit date: 08/23/2011    Discharge date and time: 08/25/2011    Admission Diagnoses: sugar high  Hyperglycemia, drug-induced  Hyperglycemia, drug-induced    Discharge Diagnoses:  Principal Diagnosis Hyperglycemia, drug-induced                                            Principal Problem:  DM2, uncont  HTN  Hyponatremia  AKI         Hospital Course:  Please refer to the admission H&P for details of presentation.   BS better.  Pt admits to noncompliance.  HbA1C 10.8.  He does not want to be taking insulin.  I stated that he may need to. His compliance is an issue. I told him that he needs to exercise/watch diet. If BS remains uncontrolled, I told him that he may need to be on insulin. He agrees.     Na improved w/ IVF. Will stop HCTZ , cont Lisinopril.    Cr improved.       [facility]      Consults: none    Significant Diagnostic Studies:   HbA1C: 10.8  Cr: 1.2    Discharge Exam:  BP 156/85   Pulse 84   Temp 98.3 ??F (36.8 ??C)   Resp 22   Ht 5\' 9"  (1.753 m)   Wt 90.719 kg (200 lb)   BMI 29.53 kg/m2   SpO2 98%  General appearance: alert, cooperative, no distress, appears stated age  Lungs: clear to auscultation bilaterally  Heart: regular rate and rhythm, S1, S2 normal, no murmur, click, rub or gallop  Abdomen: soft, non-tender. Bowel sounds normal. No masses,  no organomegaly  Extremities: extremities normal, atraumatic, no cyanosis or edema  Neurologic: Grossly normal    Disposition: home  Discharge Condition: stable    Patient Instructions:   Current Discharge Medication List      START taking these medications    Details   lisinopril (PRINIVIL, ZESTRIL) 10 mg tablet Take 1 Tab by mouth daily.  Qty: 30 Tab, Refills: 2         CONTINUE these medications which have NOT CHANGED    Details   glipiZIDE (GLUCOTROL) 10 mg tablet Take 10 mg by mouth two (2) times a day.      metFORMIN (GLUCOPHAGE) 1,000 mg tablet Take 1,000 mg by  mouth two (2) times a day.           STOP taking these medications       lisinopril-hydrochlorothiazide (PRINZIDE, ZESTORETIC) 10-12.5 mg per tablet Comments:   Reason for Stopping:         methylPREDNISolone (MEDROL, PAK,) 4 mg tablet Comments:   Reason for Stopping:             Activity: activity as tolerated  Diet: Diabetic Diet  Wound Care: None needed    Follow-up with PMD in 2 wks.   Follow-up tests/labs     Time spent to discharge patient greater than 30 minutes  Signed:  Ansel Bong, MD  08/25/2011  6:37 AM

## 2011-10-29 ENCOUNTER — Emergency Department (HOSPITAL_COMMUNITY)
Admission: EM | Admit: 2011-10-29 | Discharge: 2011-10-30 | Disposition: A | Discharge status: Home / Self Care | Payer: Medicare Other | Attending: Emergency Medicine | Admitting: Emergency Medicine

## 2011-10-29 ENCOUNTER — Emergency Department (HOSPITAL_COMMUNITY): Payer: Medicare Other

## 2011-10-29 ENCOUNTER — Encounter (HOSPITAL_COMMUNITY): Payer: Self-pay | Admitting: Emergency Medicine

## 2011-10-29 DIAGNOSIS — R350 Frequency of micturition: Secondary | ICD-10-CM | POA: Insufficient documentation

## 2011-10-29 DIAGNOSIS — Z79899 Other long term (current) drug therapy: Secondary | ICD-10-CM | POA: Insufficient documentation

## 2011-10-29 DIAGNOSIS — E119 Type 2 diabetes mellitus without complications: Secondary | ICD-10-CM | POA: Insufficient documentation

## 2011-10-29 DIAGNOSIS — I10 Essential (primary) hypertension: Secondary | ICD-10-CM | POA: Insufficient documentation

## 2011-10-29 DIAGNOSIS — R5383 Other fatigue: Secondary | ICD-10-CM | POA: Insufficient documentation

## 2011-10-29 DIAGNOSIS — R5381 Other malaise: Secondary | ICD-10-CM | POA: Insufficient documentation

## 2011-10-29 DIAGNOSIS — R1033 Periumbilical pain: Secondary | ICD-10-CM | POA: Insufficient documentation

## 2011-10-29 DIAGNOSIS — K219 Gastro-esophageal reflux disease without esophagitis: Secondary | ICD-10-CM | POA: Insufficient documentation

## 2011-10-29 DIAGNOSIS — R079 Chest pain, unspecified: Secondary | ICD-10-CM | POA: Insufficient documentation

## 2011-10-29 LAB — GLUCOSE, CAPILLARY: Glucose-Capillary: 317 mg/dL — ABNORMAL HIGH (ref 70–99)

## 2011-10-29 MED ORDER — ASPIRIN 325 MG PO TABS
325.0000 mg | ORAL_TABLET | Freq: Once | ORAL | Status: AC
  Administered 2011-10-30: 325 mg via ORAL
  Filled 2011-10-29: qty 1

## 2011-10-29 MED ORDER — SODIUM CHLORIDE 0.9 % IV BOLUS (SEPSIS)
1000.0000 mL | Freq: Once | INTRAVENOUS | Status: AC
  Administered 2011-10-29: 1000 mL via INTRAVENOUS

## 2011-10-29 NOTE — ED Notes (Signed)
Pt c/o sharp chest pain to center of chest since 8pm. Pt reports h/o acid reflux but states this pain feels a little different.  Pt states he hasn't felt good all day and his blood sugar is reading "high" on his glucometer.

## 2011-10-29 NOTE — ED Notes (Signed)
MD at bedside. Dr. Campos at bedside.  

## 2011-10-30 ENCOUNTER — Emergency Department (HOSPITAL_COMMUNITY): Payer: Medicare Other

## 2011-10-30 LAB — CBC
MCH: 30.2 pg (ref 26.0–34.0)
Platelets: 220 10*3/uL (ref 150–400)
RBC: 4.96 MIL/uL (ref 4.22–5.81)
WBC: 4.5 10*3/uL (ref 4.0–10.5)

## 2011-10-30 LAB — BASIC METABOLIC PANEL
CO2: 25 mEq/L (ref 19–32)
Calcium: 9.8 mg/dL (ref 8.4–10.5)
Chloride: 95 mEq/L — ABNORMAL LOW (ref 96–112)
Sodium: 132 mEq/L — ABNORMAL LOW (ref 135–145)

## 2011-10-30 LAB — TROPONIN I
Troponin I: <0.3 ng/mL (ref <0.30)
Troponin I: <0.3 ng/mL (ref <0.30)

## 2011-10-30 LAB — HEPATIC FUNCTION PANEL
ALT: 10 U/L (ref 0–53)
Total Protein: 7.5 g/dL (ref 6.0–8.3)

## 2011-10-30 LAB — HEMOGLOBIN A1C: Hgb A1c MFr Bld: 11.7 % — ABNORMAL HIGH (ref <5.7)

## 2011-10-30 MED ORDER — GI COCKTAIL ~~LOC~~
30.0000 mL | Freq: Once | ORAL | Status: AC
  Administered 2011-10-30: 30 mL via ORAL
  Filled 2011-10-30: qty 30

## 2011-10-30 MED ORDER — IOHEXOL 300 MG/ML  SOLN
100.0000 mL | Freq: Once | INTRAMUSCULAR | Status: AC | PRN
  Administered 2011-10-30: 100 mL via INTRAVENOUS

## 2011-10-30 NOTE — Discharge Instructions (Signed)
Abdominal Pain Abdominal pain can be caused by many things. Your caregiver decides the seriousness of your pain by an examination and possibly blood tests and X-rays. Many cases can be observed and treated at home. Most abdominal pain is not caused by a disease and will probably improve without treatment. However, in many cases, more time must pass before a clear cause of the pain can be found. Before that point, it may not be known if you need more testing, or if hospitalization or surgery is needed. HOME CARE INSTRUCTIONS   Do not take laxatives unless directed by your caregiver.   Take pain medicine only as directed by your caregiver.   Only take over-the-counter or prescription medicines for pain, discomfort, or fever as directed by your caregiver.   Try a clear liquid diet (broth, tea, or water) for as long as directed by your caregiver. Slowly move to a bland diet as tolerated.  SEEK IMMEDIATE MEDICAL CARE IF:   The pain does not go away.   You have a fever.   You keep throwing up (vomiting).   The pain is felt only in portions of the abdomen. Pain in the right side could possibly be appendicitis. In an adult, pain in the left lower portion of the abdomen could be colitis or diverticulitis.   You pass bloody or black tarry stools.  MAKE SURE YOU:   Understand these instructions.   Will watch your condition.   Will get help right away if you are not doing well or get worse.  Document Released: 03/28/2005 Document Revised: 06/07/2011 Document Reviewed: 02/04/2008 [facility] Patient Information 2012 [place], [place].Chest Pain (Nonspecific) It is often hard to give a specific diagnosis for the cause of chest pain. There is always a chance that your pain could be related to something serious, such as a heart attack or a blood clot in the lungs. You need to follow up with your caregiver for further evaluation. CAUSES   Heartburn.   Pneumonia or bronchitis.   Anxiety or stress.    Inflammation around your heart (pericarditis) or lung (pleuritis or pleurisy).   A blood clot in the lung.   A collapsed lung (pneumothorax). It can develop suddenly on its own (spontaneous pneumothorax) or from injury (trauma) to the chest.   Shingles infection (herpes zoster virus).  The chest wall is composed of bones, muscles, and cartilage. Any of these can be the source of the pain.  The bones can be bruised by injury.   The muscles or cartilage can be strained by coughing or overwork.   The cartilage can be affected by inflammation and become sore (costochondritis).  DIAGNOSIS  Lab tests or other studies, such as X-rays, electrocardiography, stress testing, or cardiac imaging, may be needed to find the cause of your pain.  TREATMENT   Treatment depends on what may be causing your chest pain. Treatment may include:   Acid blockers for heartburn.   Anti-inflammatory medicine.   Pain medicine for inflammatory conditions.   Antibiotics if an infection is present.   You may be advised to change lifestyle habits. This includes stopping smoking and avoiding alcohol, caffeine, and chocolate.   You may be advised to keep your head raised (elevated) when sleeping. This reduces the chance of acid going backward from your stomach into your esophagus.   Most of the time, nonspecific chest pain will improve within 2 to 3 days with rest and mild pain medicine.  HOME CARE INSTRUCTIONS   If antibiotics  were prescribed, take your antibiotics as directed. Finish them even if you start to feel better.   For the next few days, avoid physical activities that bring on chest pain. Continue physical activities as directed.   Do not smoke.   Avoid drinking alcohol.   Only take over-the-counter or prescription medicine for pain, discomfort, or fever as directed by your caregiver.   Follow your caregiver's suggestions for further testing if your chest pain does not go away.   Keep any  follow-up appointments you made. If you do not go to an appointment, you could develop lasting (chronic) problems with pain. If there is any problem keeping an appointment, you must call to reschedule.  SEEK MEDICAL CARE IF:   You think you are having problems from the medicine you are taking. Read your medicine instructions carefully.   Your chest pain does not go away, even after treatment.   You develop a rash with blisters on your chest.  SEEK IMMEDIATE MEDICAL CARE IF:   You have increased chest pain or pain that spreads to your arm, neck, jaw, back, or abdomen.   You develop shortness of breath, an increasing cough, or you are coughing up blood.   You have severe back or abdominal pain, feel nauseous, or vomit.   You develop severe weakness, fainting, or chills.   You have a fever.  THIS IS AN EMERGENCY. Do not wait to see if the pain will go away. Get medical help at once. Call your local emergency services (911 in [place] Do not drive yourself to the hospital. MAKE SURE YOU:   Understand these instructions.   Will watch your condition.   Will get help right away if you are not doing well or get worse.  Document Released: 03/28/2005 Document Revised: 06/07/2011 Document Reviewed: 01/22/2008 [facility] Patient Information 2012 [place], [place].

## 2011-10-30 NOTE — ED Provider Notes (Signed)
History     CSN: [id number]  Arrival date & time 10/29/11  2144   First MD Initiated Contact with Patient 10/29/11 2258      Chief Complaint  Patient presents with  . Chest Pain  . Hyperglycemia    (Consider location/radiation/quality/duration/timing/severity/associated sxs/prior treatment) The history is provided by the patient.   the patient reports his had intermittent abdominal pain for approximately 1.5 months.  He's also not been taking his metformin as prescribed.  He reports his glucometer has been reading high.  His had no nausea or vomiting.  He does report urinary frequency.  He was recently out of town on work and when he returned he told his spouse and encouraged him to come to the emergency department for evaluation.  He also developed some sharp chest discomfort this evening that started at 8.  There is no radiation of his pain.  He had no nausea or diaphoresis.  No shortness of breath.  He says been feeling generally weak all day.  Has a history of hypertension diabetes.  He smokes cigarettes.  He has no known coronary disease.  He has no family history of early heart disease.  He reports he exercises regularly and has no discomfort in his chest when he exercises and gets his heart rate up.  Past Medical History  Diagnosis Date  . Hypertension   . Diabetes mellitus   . GERD (gastroesophageal reflux disease)   . PTSD (post-traumatic stress disorder)   . Fistula, perirectal   . Small bowel obstruction   . Arthritis     History reviewed. No pertinent past surgical history.  History reviewed. No pertinent family history.  History  Substance Use Topics  . Smoking status: Former Games developer  . Smokeless tobacco: Not on file  . Alcohol Use: Yes     drinks heavily approx 5days a week.      Review of Systems  Cardiovascular: Positive for chest pain.  All other systems reviewed and are negative.    Allergies  Review of patient's allergies indicates no known  allergies.  Home Medications   Current Outpatient Rx  Name Route Sig Dispense Refill  . GLIPIZIDE 10 MG PO TABS Oral Take 1 tablet (10 mg total) by mouth 2 (two) times daily before a meal. 60 tablet 2  . LISINOPRIL 10 MG PO TABS Oral Take 10 mg by mouth daily.    Marland Kitchen METFORMIN HCL 500 MG PO TABS Oral Take 1,000 mg by mouth 2 (two) times daily with a meal.       BP 154/95  Pulse 87  Temp(Src) 98 F (36.7 C) (Oral)  Resp 15  SpO2 99%  Physical Exam  Nursing note and vitals reviewed. Constitutional: He is oriented to person, place, and time. He appears well-developed and well-nourished.  HENT:  Head: Normocephalic and atraumatic.  Eyes: EOM are normal.  Neck: Normal range of motion.  Cardiovascular: Normal rate, regular rhythm, normal heart sounds and intact distal pulses.   Pulmonary/Chest: Effort normal and breath sounds normal. No respiratory distress.  Abdominal: Soft. He exhibits no distension. There is no rebound and no guarding.       Mild periumbilical abdominal tenderness without guarding or rebound  Musculoskeletal: Normal range of motion.  Neurological: He is alert and oriented to person, place, and time.  Skin: Skin is warm and dry.  Psychiatric: He has a normal mood and affect. Judgment normal.    ED Course  Procedures (including critical care time)  Date: 10/30/2011  Rate: 80   Rhythm: normal sinus rhythm  QRS Axis: normal  Intervals: normal  ST/T Wave abnormalities: Inverted T waves in 3 and aVF  Conduction Disutrbances: none  Narrative Interpretation:   Old EKG Reviewed: Nonspecific T wave changes as compared to prior EKG      Labs Reviewed  GLUCOSE, CAPILLARY - Abnormal; Notable for the following:    Glucose-Capillary 317 (*)    All other components within normal limits  BASIC METABOLIC PANEL - Abnormal; Notable for the following:    Sodium 132 (*)    Chloride 95 (*)    Glucose, Bld 301 (*)    All other components within normal limits  GLUCOSE,  CAPILLARY - Abnormal; Notable for the following:    Glucose-Capillary 302 (*)    All other components within normal limits  CBC  TROPONIN I  HEPATIC FUNCTION PANEL  LIPASE, BLOOD  TROPONIN I  HEMOGLOBIN A1C   Dg Chest 2 View  10/30/2011  *RADIOLOGY REPORT*  Clinical Data: Chest pain, hyperglycemia, indigestion  CHEST - 2 VIEW  Comparison: 01/28/2011  Findings: Grossly unchanged cardiac silhouette and mediastinal contours.  No focal airspace opacities.  No pleural effusion or pneumothorax.  No acute osseous abnormalities.  There is mild gaseous distension of several loops of bowel within the visualized upper abdomen.  IMPRESSION: No acute cardiopulmonary disease.  Original Report Authenticated By: Waynard Reeds, M.D.   Ct Abdomen Pelvis W Contrast  10/30/2011  *RADIOLOGY REPORT*  Clinical Data: Abdominal pain  CT ABDOMEN AND PELVIS WITH CONTRAST  Technique:  Multidetector CT imaging of the abdomen and pelvis was performed following the standard protocol during bolus administration of intravenous contrast.  Contrast: OMNIPAQUE IOHEXOL 300 MG/ML  SOLN  Comparison: 03/08/2011  Findings: Limited images through the lung bases demonstrate no significant appreciable abnormality. The heart size is within normal limits. No pleural or pericardial effusion.  Hepatic steatosis.  A focal area of hypoattenuation within segment four on image 25 of series 2 is likely secondary to more focal fat. Unremarkable biliary system, spleen, pancreas, adrenal glands.  Multiple renal hypodensities, the largest of which is located within the upper pole, measuring 2.2 cm in keeping with a simple cyst.  The small hypodensities are too small further characterize. No hydronephrosis or hydroureter.  No urinary tract calculi identified.  No bowel obstruction.  No CT evidence for colitis.  Normal appendix.  No free intraperitoneal air or fluid.  No lymphadenopathy.  There is scattered atherosclerotic calcification of the aorta  and its branches. No aneurysmal dilatation.  Thin-walled bladder.  No acute osseous abnormality.  IMPRESSION: No acute CT abnormality identified.  Hepatic steatosis.  Original Report Authenticated By: Waneta Martins, M.D.     1. Abdominal pain   2. GERD (gastroesophageal reflux disease)   3. Chest pain       MDM  The patient's main complaint is his intermittent upper abdominal pain.  His CT scan is without significant abnormality.  His labs are significant for hyperglycemia.  The patient reports noncompliance with his metformin.  He's been encouraged to take his metformin.  He will followup with his doctor later today.  His chest pain is atypical and sounds more like GERD with occasional sharp stabbing pain.  EKG does have what appears to be nonspecific and possibly inverted inferior T waves which are new since his EKG and 2012 however I don't think his clinical symptoms are suggestive of ACS.  Outpatient cardiology followup  recommended as well.  The patient understands to return the emergency department for new or worsening symptoms.  No family history of early heart disease.  I hemoglobin A1c was sent to assess his primary care physician in managing his diabetes      Lyanne Co, MD 10/30/11 [phone]

## 2011-10-30 NOTE — ED Notes (Signed)
Patient transported to CT 

## 2011-11-30 ENCOUNTER — Emergency Department (HOSPITAL_BASED_OUTPATIENT_CLINIC_OR_DEPARTMENT_OTHER)
Admission: EM | Admit: 2011-11-30 | Discharge: 2011-11-30 | Disposition: A | Discharge status: Home / Self Care | Payer: Medicare Other | Attending: Emergency Medicine | Admitting: Emergency Medicine

## 2011-11-30 ENCOUNTER — Encounter (HOSPITAL_BASED_OUTPATIENT_CLINIC_OR_DEPARTMENT_OTHER): Payer: Self-pay | Admitting: *Deleted

## 2011-11-30 DIAGNOSIS — T7840XA Allergy, unspecified, initial encounter: Secondary | ICD-10-CM | POA: Insufficient documentation

## 2011-11-30 DIAGNOSIS — I10 Essential (primary) hypertension: Secondary | ICD-10-CM | POA: Insufficient documentation

## 2011-11-30 DIAGNOSIS — K219 Gastro-esophageal reflux disease without esophagitis: Secondary | ICD-10-CM | POA: Insufficient documentation

## 2011-11-30 DIAGNOSIS — E119 Type 2 diabetes mellitus without complications: Secondary | ICD-10-CM | POA: Insufficient documentation

## 2011-11-30 DIAGNOSIS — F4310 Post-traumatic stress disorder, unspecified: Secondary | ICD-10-CM | POA: Insufficient documentation

## 2011-11-30 DIAGNOSIS — Z87891 Personal history of nicotine dependence: Secondary | ICD-10-CM | POA: Insufficient documentation

## 2011-11-30 DIAGNOSIS — L252 Unspecified contact dermatitis due to dyes: Secondary | ICD-10-CM

## 2011-11-30 DIAGNOSIS — M129 Arthropathy, unspecified: Secondary | ICD-10-CM | POA: Insufficient documentation

## 2011-11-30 MED ORDER — HYDROCORTISONE 2.5 % EX CREA: TOPICAL_CREAM | Freq: Two times a day (BID) | CUTANEOUS | Status: AC

## 2011-11-30 MED ORDER — METHYLPREDNISOLONE SODIUM SUCC 125 MG IJ SOLR
125.0000 mg | Freq: Once | INTRAMUSCULAR | Status: AC
  Administered 2011-11-30: 125 mg via INTRAMUSCULAR
  Filled 2011-11-30: qty 2

## 2011-11-30 NOTE — ED Provider Notes (Signed)
History     CSN: [id number]  Arrival date & time 11/30/11  0157   First MD Initiated Contact with Patient 11/30/11 0211      Chief Complaint  Patient presents with  . Allergic Reaction    (Consider location/radiation/quality/duration/timing/severity/associated sxs/prior treatment) HPI This is a 59 black male who has a history of topical allergic reaction to hair dye. He tried dyeing his beard with [employer] about 7:30 yesterday p.m. within several hours he developed erythema and itching of the skin that came in contact with the dye. The symptoms are moderate in severity. He is requesting a shot of steroids which has helped him in the past. He states steroids have not significantly elevated sugar in the past.  Past Medical History  Diagnosis Date  . Hypertension   . Diabetes mellitus   . GERD (gastroesophageal reflux disease)   . PTSD (post-traumatic stress disorder)   . Fistula, perirectal   . Small bowel obstruction   . Arthritis     History reviewed. No pertinent past surgical history.  History reviewed. No pertinent family history.  History  Substance Use Topics  . Smoking status: Former Games developer  . Smokeless tobacco: Not on file  . Alcohol Use: Yes     drinks heavily approx 5days a week.      Review of Systems  All other systems reviewed and are negative.    Allergies  Review of patient's allergies indicates no known allergies.  Home Medications   Current Outpatient Rx  Name Route Sig Dispense Refill  . GLIPIZIDE 10 MG PO TABS Oral Take 1 tablet (10 mg total) by mouth 2 (two) times daily before a meal. 60 tablet 2  . LISINOPRIL 10 MG PO TABS Oral Take 10 mg by mouth daily.    Marland Kitchen METFORMIN HCL 500 MG PO TABS Oral Take 1,000 mg by mouth 2 (two) times daily with a meal.       BP 145/98  Pulse 88  Temp(Src) 98.5 F (36.9 C) (Oral)  Resp 18  Ht 5\' 9"  (1.753 m)  Wt 205 lb (92.987 kg)  BMI 30.27 kg/m2  SpO2 100%  Physical Exam General:  Well-developed, well-nourished male in no acute distress; appearance consistent with age of record HENT: normocephalic, atraumatic Eyes: pupils equal round and reactive to light; extraocular muscles intact Neck: supple Heart: regular rate and rhythm Lungs: clear to auscultation bilaterally Abdomen: soft; nondistended Extremities: No deformity; full range of motion Neurologic: Awake, alert and oriented; motor function intact in all extremities and symmetric; no facial droop Skin: Warm and dry; erythematous rash of the bearded part of the face Psychiatric: Normal mood and affect    ED Course  Procedures (including critical care time)   MDM          Hanley Seamen, MD 11/30/11 [phone]

## 2011-11-30 NOTE — Discharge Instructions (Signed)

## 2011-11-30 NOTE — ED Notes (Signed)
Pt reports chemical reaction to hair dye he used on his beard last night. States that he's here to receive a steroid shot to reduce the swelling. States that benadryl does not work for him.

## 2011-11-30 NOTE — ED Notes (Signed)
Pt states that he tried to dye his beard with [employer] and had a reaction to the dye. Pt reports that he has had this happen before with other hair dyes. Pt's face is swollen around the beard. Pt denies difficulty breathing or swallowing. Pt's O2 sats 100% on room air.

## 2011-12-01 ENCOUNTER — Emergency Department (HOSPITAL_BASED_OUTPATIENT_CLINIC_OR_DEPARTMENT_OTHER)
Admission: EM | Admit: 2011-12-01 | Discharge: 2011-12-02 | Disposition: A | Discharge status: Home / Self Care | Payer: Medicare Other | Attending: Emergency Medicine | Admitting: Emergency Medicine

## 2011-12-01 ENCOUNTER — Encounter (HOSPITAL_BASED_OUTPATIENT_CLINIC_OR_DEPARTMENT_OTHER): Payer: Self-pay | Admitting: *Deleted

## 2011-12-01 DIAGNOSIS — I10 Essential (primary) hypertension: Secondary | ICD-10-CM | POA: Insufficient documentation

## 2011-12-01 DIAGNOSIS — T7840XA Allergy, unspecified, initial encounter: Secondary | ICD-10-CM

## 2011-12-01 DIAGNOSIS — M129 Arthropathy, unspecified: Secondary | ICD-10-CM | POA: Insufficient documentation

## 2011-12-01 DIAGNOSIS — T494X5A Adverse effect of keratolytics, keratoplastics, and other hair treatment drugs and preparations, initial encounter: Secondary | ICD-10-CM | POA: Insufficient documentation

## 2011-12-01 DIAGNOSIS — F4310 Post-traumatic stress disorder, unspecified: Secondary | ICD-10-CM | POA: Insufficient documentation

## 2011-12-01 DIAGNOSIS — Z87891 Personal history of nicotine dependence: Secondary | ICD-10-CM | POA: Insufficient documentation

## 2011-12-01 DIAGNOSIS — K219 Gastro-esophageal reflux disease without esophagitis: Secondary | ICD-10-CM | POA: Insufficient documentation

## 2011-12-01 DIAGNOSIS — E119 Type 2 diabetes mellitus without complications: Secondary | ICD-10-CM | POA: Insufficient documentation

## 2011-12-01 MED ORDER — PREDNISONE 50 MG PO TABS: ORAL_TABLET | ORAL | Status: AC

## 2011-12-01 MED ORDER — PREDNISONE 20 MG PO TABS
ORAL_TABLET | ORAL | Status: AC
  Filled 2011-12-01: qty 3

## 2011-12-01 MED ORDER — FAMOTIDINE 20 MG PO TABS
20.0000 mg | ORAL_TABLET | Freq: Once | ORAL | Status: AC
  Administered 2011-12-01: 20 mg via ORAL
  Filled 2011-12-01: qty 1

## 2011-12-01 MED ORDER — DIPHENHYDRAMINE HCL 25 MG PO CAPS
ORAL_CAPSULE | ORAL | Status: AC
  Filled 2011-12-01: qty 2

## 2011-12-01 MED ORDER — DIPHENHYDRAMINE HCL 50 MG PO CAPS
50.0000 mg | ORAL_CAPSULE | Freq: Once | ORAL | Status: AC
  Administered 2011-12-01: 50 mg via ORAL
  Filled 2011-12-01: qty 1

## 2011-12-01 MED ORDER — PREDNISONE 50 MG PO TABS
60.0000 mg | ORAL_TABLET | Freq: Once | ORAL | Status: AC
  Administered 2011-12-01: 60 mg via ORAL

## 2011-12-01 NOTE — Discharge Instructions (Signed)

## 2011-12-01 NOTE — ED Provider Notes (Signed)
Medical screening examination/treatment/procedure(s) were performed by non-physician practitioner and as supervising physician I was immediately available for consultation/collaboration.   Deondrae Mcgrail, MD 12/01/11 2325 

## 2011-12-01 NOTE — ED Provider Notes (Signed)
History     CSN: [id number]  Arrival date & time 12/01/11  2043   First MD Initiated Contact with Patient 12/01/11 2212      Chief Complaint  Patient presents with  . Allergic Reaction    (Consider location/radiation/quality/duration/timing/severity/associated sxs/prior treatment) HPI Comments: Pt here several days ago.  Had allergic reaction after using hair dye on his beard.  Given shot decadron with significant improvement.  After it wore off he began itching and has had hives for the past 2 days.  He denies SOB or diff swallowing.  Patient is a 46 y.o. male presenting with allergic reaction. The history is provided by the patient. No language interpreter was used.  Allergic Reaction The primary symptoms are  urticaria. The primary symptoms do not include wheezing, shortness of breath, nausea, vomiting, altered mental status, rash or angioedema. The current episode started 2 days ago. The problem has not changed since onset. Associated with: hair dye. Significant symptoms also include itching.    Past Medical History  Diagnosis Date  . Hypertension   . Diabetes mellitus   . GERD (gastroesophageal reflux disease)   . PTSD (post-traumatic stress disorder)   . Fistula, perirectal   . Small bowel obstruction   . Arthritis     Past Surgical History  Procedure Date  . Rectal surgery     No family history on file.  History  Substance Use Topics  . Smoking status: Former Games developer  . Smokeless tobacco: Never Used  . Alcohol Use: 1.8 oz/week    3 Cans of beer per week     drinks heavily approx 5days a week.      Review of Systems  Constitutional: Negative for fever.  HENT: Negative for sore throat, drooling, trouble swallowing and voice change.   Respiratory: Negative for chest tightness, shortness of breath, wheezing and stridor.   Gastrointestinal: Negative for nausea and vomiting.  Skin: Positive for itching. Negative for rash.  Psychiatric/Behavioral: Negative for  altered mental status.    Allergies  Review of patient's allergies indicates no known allergies.  Home Medications   Current Outpatient Rx  Name Route Sig Dispense Refill  . GLIPIZIDE 10 MG PO TABS Oral Take 1 tablet (10 mg total) by mouth 2 (two) times daily before a meal. 60 tablet 2  . HYDROCORTISONE 2.5 % EX CREA Topical Apply topically 2 (two) times daily. 30 g 0  . LISINOPRIL 10 MG PO TABS Oral Take 10 mg by mouth daily.    Marland Kitchen METFORMIN HCL 1000 MG PO TABS Oral Take 1,000 mg by mouth 2 (two) times daily.      BP 128/82  Pulse 96  Temp(Src) 98.2 F (36.8 C) (Oral)  Resp 20  Ht 5\' 9"  (1.753 m)  Wt 205 lb (92.987 kg)  BMI 30.27 kg/m2  SpO2 98%  Physical Exam  Nursing note and vitals reviewed. Constitutional: He is oriented to person, place, and time. He appears well-developed and well-nourished.  HENT:  Head: Normocephalic and atraumatic.  Eyes: EOM are normal.  Neck: Normal range of motion. No tracheal deviation present.  Cardiovascular: Normal rate, regular rhythm, normal heart sounds and intact distal pulses.   Pulmonary/Chest: Effort normal and breath sounds normal. No stridor. No respiratory distress. He has no wheezes. He has no rales.  Abdominal: Soft. He exhibits no distension. There is no tenderness.  Musculoskeletal: Normal range of motion.  Lymphadenopathy:    He has no cervical adenopathy.  Neurological: He is alert and oriented  to person, place, and time.  Skin: Skin is warm and dry. Rash noted. No petechiae and no purpura noted. Rash is urticarial. Rash is not macular, not papular, not nodular, not pustular and not vesicular. There is erythema.       Nearly confluent urticaria covering anterior neck and upper chest with diffuse erythema.  Psychiatric: He has a normal mood and affect. Judgment normal.    ED Course  Procedures (including critical care time)  Labs Reviewed - No data to display No results found.   1. Allergic reaction       MDM    rx prednisone 50 mg, QD 6 Benadryl 50 mg QID pepcid 20 mg BID Check CBG QID Return if sxs worsen or if your CBG gets unmanageable.        Worthy Rancher, PA 12/01/11 [phone]

## 2011-12-01 NOTE — ED Notes (Addendum)
Pt seen here Friday for allergic reaction to beard dye- states he got a "shot" but thinks it is wearing off b/c his sx have returned- also states "throat feels tight, hard to swallow"

## 2012-05-19 NOTE — ED Notes (Signed)
BS 536 in triage.  Pt with high BS over the past 4 days.

## 2012-05-20 LAB — METABOLIC PANEL, COMPREHENSIVE
A-G Ratio: 1 — ABNORMAL LOW (ref 1.2–3.5)
ALT (SGPT): 21 U/L (ref 12–65)
AST (SGOT): 25 U/L (ref 15–37)
Albumin: 3.7 g/dL (ref 3.5–5.0)
Alk. phosphatase: 120 U/L (ref 50–136)
Anion gap: 9 mmol/L (ref 7–16)
BUN: 16 MG/DL (ref 6–23)
Bilirubin, total: 0.6 MG/DL (ref 0.2–1.1)
CO2: 26 MMOL/L (ref 21–32)
Calcium: 8.7 MG/DL (ref 8.3–10.4)
Chloride: 96 MMOL/L — ABNORMAL LOW (ref 98–107)
Creatinine: 1.2 MG/DL (ref 0.8–1.5)
GFR est AA: >60 mL/min/{1.73_m2} (ref >60)
GFR est non-AA: >60 mL/min/{1.73_m2} (ref >60)
Globulin: 3.6 g/dL — ABNORMAL HIGH (ref 2.3–3.5)
Glucose: 484 MG/DL — CR (ref 65–100)
Potassium: 4.1 MMOL/L (ref 3.5–5.1)
Protein, total: 7.3 g/dL (ref 6.3–8.2)
Sodium: 131 MMOL/L — ABNORMAL LOW (ref 136–145)

## 2012-05-20 LAB — CBC WITH AUTOMATED DIFF
ABS. BASOPHILS: 0 10*3/uL (ref 0.0–0.2)
ABS. EOSINOPHILS: 0.1 10*3/uL (ref 0.0–0.8)
ABS. IMM. GRANS.: 0 10*3/uL (ref 0.0–0.5)
ABS. LYMPHOCYTES: 1.9 10*3/uL (ref 0.5–4.6)
ABS. MONOCYTES: 0.4 10*3/uL (ref 0.1–1.3)
ABS. NEUTROPHILS: 1.4 10*3/uL — ABNORMAL LOW (ref 1.7–8.2)
BASOPHILS: 1 % (ref 0.0–2.0)
EOSINOPHILS: 2 % (ref 0.5–7.8)
HCT: 41.5 % (ref 41.1–50.3)
HGB: 15.1 g/dL (ref 13.6–17.2)
IMMATURE GRANULOCYTES: 0 % (ref 0.0–5.0)
LYMPHOCYTES: 51 % — ABNORMAL HIGH (ref 13–44)
MCH: 31.5 PG (ref 26.1–32.9)
MCHC: 36.4 g/dL — ABNORMAL HIGH (ref 31.4–35.0)
MCV: 86.6 FL (ref 79.6–97.8)
MONOCYTES: 10 % (ref 4.0–12.0)
MPV: 10.3 FL — ABNORMAL LOW (ref 10.8–14.1)
NEUTROPHILS: 36 % — ABNORMAL LOW (ref 43–78)
PLATELET: 183 10*3/uL (ref 150–450)
RBC: 4.79 M/uL (ref 4.23–5.67)
RDW: 12.7 % (ref 11.9–14.6)
WBC: 3.8 10*3/uL — ABNORMAL LOW (ref 4.3–11.1)

## 2012-05-20 LAB — BLOOD GAS, VENOUS
CARBOXYHEMOGLOBIN: 0.5 % (ref 0.5–1.5)
DEOXYHEMOGLOBIN: 32 % — ABNORMAL HIGH (ref 0.0–5.0)
METHEMOGLOBIN: 0.3 % (ref 0.0–1.5)
TOTAL HEMOGLOBIN: 16 GM/DL — ABNORMAL HIGH (ref 11.7–15.0)
VENOUS BASE EXCESS: 0.5 mmol/L
VENOUS BICARBONATE: 26 mmol/L
VENOUS O2 HGB: 67.6 %
VENOUS O2 SATURATION: 68 %
VENOUS PCO2: 43 mmHg
VENOUS PH: 7.39
VENOUS PO2: <36 mmHg

## 2012-05-20 LAB — GLUCOSE, POC
Glucose (POC): 417 mg/dL — ABNORMAL HIGH (ref 65–100)
Glucose (POC): 380 mg/dL — ABNORMAL HIGH (ref 65–100)
Glucose (POC): 536 mg/dL — CR (ref 65–100)

## 2012-05-20 MED ADMIN — insulin regular (NOVOLIN, HUMULIN) injection 10 Units: INTRAVENOUS | @ 04:00:00 | NDC 00002821517

## 2012-05-20 MED ADMIN — insulin regular (NOVOLIN, HUMULIN) injection 8 Units: INTRAVENOUS | @ 05:00:00 | NDC 00002821517

## 2012-05-20 MED ADMIN — 0.9% sodium chloride infusion 1,000 mL: INTRAVENOUS | @ 04:00:00 | NDC 00409798309

## 2012-05-20 MED FILL — NOVOLIN R REGULAR U-100 INSULIN 100 UNIT/ML INJECTION SOLUTION: 100 unit/mL | INTRAMUSCULAR | Qty: 10

## 2012-05-20 NOTE — ED Provider Notes (Addendum)
Patient is a 46 y.o. male presenting with hyperglycemia. The history is provided by the patient.   High Blood Sugar   This is a new problem. The current episode started more than 2 days ago. The problem occurs constantly. The problem has not changed since onset.Associated with: Pt states he has been very thirsty and feels dehydrated. Pain location: No pain. The patient is experiencing no pain. Associated symptoms include myalgias. Pertinent negatives include no fever, no diarrhea, no nausea, no vomiting, no constipation, no dysuria, no frequency, no arthralgias, no chest pain and no back pain. Associated symptoms comments: Increased thirst. Nothing worsens the pain. The pain is relieved by nothing.     Pt has not seen a PMD since starting Insulin in July. Pt has not had had any change in his insulin as well as no change in his diet. Pt has been running in the 400s-500s.    Past Medical History   Diagnosis Date   ??? Diabetes    ??? Hypertension    ??? Psychiatric disorder      PTSD        Past Surgical History   Procedure Laterality Date   ??? Hx other surgical       anal fissure         History reviewed. No pertinent family history.     History     Social History   ??? Marital Status: SINGLE     Spouse Name: N/A     Number of Children: N/A   ??? Years of Education: N/A     Occupational History   ??? Not on file.     Social History Main Topics   ??? Smoking status: Former Smoker   ??? Smokeless tobacco: Not on file   ??? Alcohol Use: Yes      Comment: occas   ??? Drug Use: No   ??? Sexually Active: Not on file     Other Topics Concern   ??? Not on file     Social History Narrative   ??? No narrative on file                  ALLERGIES: Review of patient's allergies indicates no known allergies.      Review of Systems   Constitutional: Negative for fever.   HENT: Negative for neck pain.    Eyes: Negative for visual disturbance.   Respiratory: Negative for shortness of breath.    Cardiovascular: Negative for chest pain.   Gastrointestinal:  Negative for nausea, vomiting, diarrhea and constipation.   Genitourinary: Negative for dysuria and frequency.   Musculoskeletal: Positive for myalgias. Negative for back pain and arthralgias.   All other systems reviewed and are negative.        Filed Vitals:    05/19/12 2139 05/19/12 2141 05/19/12 2214   BP: 143/97     Pulse: 89     Temp: 98.1 ??F (36.7 ??C)     Resp: 20     Height: 5\' 9"  (1.753 m)     Weight:  92.987 kg (205 lb)    SpO2: 99%  99%            Physical Exam   Nursing note and vitals reviewed.  Constitutional: He is oriented to person, place, and time. He appears well-developed and well-nourished.   HENT:   Head: Normocephalic and atraumatic.   Dry Mucosal membranes   Eyes: EOM are normal.   Neck: Normal range of motion.  Cardiovascular: Normal rate and regular rhythm.    Pulmonary/Chest: Breath sounds normal.   Abdominal: Soft. Bowel sounds are normal.   Musculoskeletal: Normal range of motion.   Neurological: He is alert and oriented to person, place, and time.   Skin: Skin is warm. No rash noted.        MDM     Differential Diagnosis; Clinical Impression; Plan:     Hyperglycemia/ IDDM/ DKA/ Dehydration/ Electrolyte Abnl/ Infection  Amount and/or Complexity of Data Reviewed:   Clinical lab tests:  Ordered and reviewed   Independant visualization of image, tracing, or specimen:  Yes (UDIP + glucose Negative Ketones)      Procedures    Discussed with pt all results as well as need to see PMD and get HGB A1c and insulin adjusted. Pt subjectively improved.

## 2012-05-20 NOTE — ED Notes (Signed)
I have reviewed discharge instructions with the patient.  The patient verbalized understanding.

## 2012-05-20 NOTE — ED Notes (Signed)
Pt resting comfortably in room.

## 2013-01-31 ENCOUNTER — Encounter (HOSPITAL_BASED_OUTPATIENT_CLINIC_OR_DEPARTMENT_OTHER): Payer: Self-pay | Admitting: *Deleted

## 2013-01-31 ENCOUNTER — Emergency Department (HOSPITAL_BASED_OUTPATIENT_CLINIC_OR_DEPARTMENT_OTHER)
Admission: EM | Admit: 2013-01-31 | Discharge: 2013-01-31 | Disposition: A | Discharge status: Home / Self Care | Payer: Medicare Other | Attending: Emergency Medicine | Admitting: Emergency Medicine

## 2013-01-31 DIAGNOSIS — F17200 Nicotine dependence, unspecified, uncomplicated: Secondary | ICD-10-CM | POA: Insufficient documentation

## 2013-01-31 DIAGNOSIS — M129 Arthropathy, unspecified: Secondary | ICD-10-CM | POA: Insufficient documentation

## 2013-01-31 DIAGNOSIS — L089 Local infection of the skin and subcutaneous tissue, unspecified: Secondary | ICD-10-CM | POA: Insufficient documentation

## 2013-01-31 DIAGNOSIS — Z8659 Personal history of other mental and behavioral disorders: Secondary | ICD-10-CM | POA: Insufficient documentation

## 2013-01-31 DIAGNOSIS — Z79899 Other long term (current) drug therapy: Secondary | ICD-10-CM | POA: Insufficient documentation

## 2013-01-31 DIAGNOSIS — E119 Type 2 diabetes mellitus without complications: Secondary | ICD-10-CM | POA: Insufficient documentation

## 2013-01-31 DIAGNOSIS — I10 Essential (primary) hypertension: Secondary | ICD-10-CM | POA: Insufficient documentation

## 2013-01-31 DIAGNOSIS — Z8719 Personal history of other diseases of the digestive system: Secondary | ICD-10-CM | POA: Insufficient documentation

## 2013-01-31 MED ORDER — CLINDAMYCIN HCL 150 MG PO CAPS: 300.0000 mg | ORAL_CAPSULE | Freq: Three times a day (TID) | ORAL | Status: DC

## 2013-01-31 MED ORDER — OXYCODONE-ACETAMINOPHEN 5-325 MG PO TABS: 2.0000 | ORAL_TABLET | ORAL | Status: DC | PRN

## 2013-01-31 NOTE — ED Notes (Signed)
Patient c/o abscess behing R ear, swollen, hurts to touch. Also c/o itching dry skin

## 2013-01-31 NOTE — ED Provider Notes (Signed)
CSN: [id number]     Arrival date & time 01/31/13  1029 History     First MD Initiated Contact with Patient 01/31/13 1033     Chief Complaint  Patient presents with  . Abscess    Patient is a 47 y.o. male presenting with abscess. The history is provided by the patient.  Abscess Location:  Head/neck Head/neck abscess location: behind right ear. Abscess quality: induration and painful   Abscess quality: not draining   Duration:  3 days Progression:  Worsening Pain details:    Severity:  Moderate   Timing:  Constant Associated symptoms: no fever   pt reports he has a "boil" behind right ear No ear drainage No falls/trauma No hearing changes He just got back from [place] 3 days ago and he has had it since  Past Medical History  Diagnosis Date  . Hypertension   . Diabetes mellitus   . GERD (gastroesophageal reflux disease)   . PTSD (post-traumatic stress disorder)   . Fistula, perirectal   . Small bowel obstruction   . Arthritis    Past Surgical History  Procedure Laterality Date  . Rectal surgery     No family history on file. History  Substance Use Topics  . Smoking status: Current Some Day Smoker  . Smokeless tobacco: Never Used  . Alcohol Use: 1.8 oz/week    3 Cans of beer per week     Comment: drinks heavily approx 5days a week.    Review of Systems  Constitutional: Negative for fever.  HENT: Negative for hearing loss and ear discharge.     Allergies  Review of patient's allergies indicates no known allergies.  Home Medications   Current Outpatient Rx  Name  Route  Sig  Dispense  Refill  . clindamycin (CLEOCIN) 150 MG capsule   Oral   Take 2 capsules (300 mg total) by mouth 3 (three) times daily.   30 capsule   0   . EXPIRED: glipiZIDE (GLUCOTROL) 10 MG tablet   Oral   Take 1 tablet (10 mg total) by mouth 2 (two) times daily before a meal.   60 tablet   2   . lisinopril (PRINIVIL,ZESTRIL) 10 MG tablet   Oral   Take 10 mg by mouth daily.          . metFORMIN (GLUCOPHAGE) 1000 MG tablet   Oral   Take 1,000 mg by mouth 2 (two) times daily.         Marland Kitchen oxyCODONE-acetaminophen (PERCOCET/ROXICET) 5-325 MG per tablet   Oral   Take 2 tablets by mouth every 4 (four) hours as needed for pain.   6 tablet   0    BP 166/108  Pulse 98  Temp(Src) 98.3 F (36.8 C) (Oral)  Resp 18  SpO2 99% Physical Exam CONSTITUTIONAL: Well developed/well nourished HEAD AND FACE: Normocephalic/atraumatic EYES: EOMI/PERRL ENMT: Mucous membranes moist.   Ears symmetric No mastoid tenderness on either side No drainage from either ear He has small area of induration proximal to mastoid.  No crepitance/drainage/streaking. NECK: supple no meningeal signs LUNGS:  no apparent distress ABDOMEN: soft, nontender, no rebound or guarding NEURO: Pt is awake/alert, moves all extremitiesx4 EXTREMITIES:full ROM SKIN: warm, color normal  ED Course   Procedures 1. Skin infection     MDM  Nursing notes including past medical history and social history reviewed and considered in documentation  At this point does not appear amenable to drainage.  There could also be some lymphadenopathy present  Will start on clindamycin.  He has f/u in two day at the [facility] hospital for med refill and I advised closely following his glucose   Joya Gaskins, MD 01/31/13 1140

## 2013-04-21 MED ADMIN — ketorolac tromethamine (TORADOL) 60 mg/2 mL injection 60 mg: INTRAMUSCULAR | @ 06:00:00 | NDC 00409379601

## 2013-04-21 NOTE — ED Notes (Signed)
The patient was given their discharge instructions and  was given prescriptions.   The  patient verbalized understanding and had no additional questions. The patient was alert and was discharged via Ambulatory, without additional complaints at time of discharge.  No apparent distress noted

## 2013-04-21 NOTE — ED Notes (Addendum)
Pt reports significant symptomatic improvement after Toradol.  US shows good blood flow to both testicles.

## 2013-04-21 NOTE — ED Provider Notes (Signed)
Patient is a 47 y.o. male presenting with testicular pain. The history is provided by the patient.   Testicle Pain  This is a new problem. The current episode started 2 days ago. The problem occurs constantly. The problem has been gradually worsening. Pertinent negatives include no dysuria, no genital itching, no genital lesions, no genital rash, no penile discharge, no penile pain, no testicular mass, no swelling, no scrotal pain, no priapism and no inability to urinate. The symptoms occur spontaneously. Associated symptoms include nausea, abdominal pain and flank pain. Pertinent negatives include no vomiting, no abdominal swelling, no frequency, no constipation and no diarrhea. There has been no fever.  He has tried NSAIDs (beer) for the symptoms. The treatment provided no relief. Sexual activity: sexually active.         Past Medical History   Diagnosis Date   ??? Diabetes    ??? Hypertension    ??? Psychiatric disorder      PTSD        Past Surgical History   Procedure Laterality Date   ??? Hx other surgical       anal fissure         History reviewed. No pertinent family history.     History     Social History   ??? Marital Status: SINGLE     Spouse Name: N/A     Number of Children: N/A   ??? Years of Education: N/A     Occupational History   ??? Not on file.     Social History Main Topics   ??? Smoking status: Former Smoker   ??? Smokeless tobacco: Not on file   ??? Alcohol Use: Yes      Comment: occas   ??? Drug Use: No   ??? Sexually Active: Not on file     Other Topics Concern   ??? Not on file     Social History Narrative   ??? No narrative on file                  ALLERGIES: Review of patient's allergies indicates no known allergies.      Review of Systems   Gastrointestinal: Positive for nausea and abdominal pain. Negative for vomiting, diarrhea and constipation.   Genitourinary: Positive for flank pain and testicular pain. Negative for dysuria, frequency, penile pain and penile discharge.   All other systems reviewed and are  negative.        Filed Vitals:    04/21/13 0115   BP: 149/96   Pulse: 83   Temp: 97.1 ??F (36.2 ??C)   Resp: 20   Height: 5\' 9"  (1.753 m)   Weight: 92.987 kg (205 lb)            Physical Exam   Nursing note and vitals reviewed.  Constitutional: He is oriented to person, place, and time. He appears well-developed and well-nourished. No distress.   HENT:   Head: Normocephalic and atraumatic.   Eyes: Conjunctivae and EOM are normal. Pupils are equal, round, and reactive to light.   Genitourinary: Penis normal. No swelling in the scrotum or testes.   Testicles with normal lie.  Bilateral tenderness without mass; negative blue dot sign; no hernia no rash or swelling.   Neurological: He is alert and oriented to person, place, and time.   Skin: Skin is warm and dry.   Psychiatric: He has a normal mood and affect. His behavior is normal.        MDM  Amount and/or Complexity of Data Reviewed:   Clinical lab tests:  Ordered and reviewed  Tests in the radiology section of CPT??:  Ordered and reviewed  Risk of Significant Complications, Morbidity, and/or Mortality:   Presenting problems:  Moderate  Diagnostic procedures:  Moderate  Management options:  Moderate  Progress:   Patient progress:  Stable      Procedures

## 2014-01-07 ENCOUNTER — Emergency Department (HOSPITAL_BASED_OUTPATIENT_CLINIC_OR_DEPARTMENT_OTHER)
Admission: EM | Admit: 2014-01-07 | Discharge: 2014-01-08 | Disposition: A | Discharge status: Home / Self Care | Payer: Medicare Other | Attending: Emergency Medicine | Admitting: Emergency Medicine

## 2014-01-07 ENCOUNTER — Encounter (HOSPITAL_BASED_OUTPATIENT_CLINIC_OR_DEPARTMENT_OTHER): Payer: Self-pay | Admitting: Emergency Medicine

## 2014-01-07 DIAGNOSIS — Z79899 Other long term (current) drug therapy: Secondary | ICD-10-CM | POA: Insufficient documentation

## 2014-01-07 DIAGNOSIS — Z794 Long term (current) use of insulin: Secondary | ICD-10-CM | POA: Insufficient documentation

## 2014-01-07 DIAGNOSIS — T50995A Adverse effect of other drugs, medicaments and biological substances, initial encounter: Secondary | ICD-10-CM | POA: Insufficient documentation

## 2014-01-07 DIAGNOSIS — I10 Essential (primary) hypertension: Secondary | ICD-10-CM | POA: Insufficient documentation

## 2014-01-07 DIAGNOSIS — T7840XA Allergy, unspecified, initial encounter: Secondary | ICD-10-CM

## 2014-01-07 DIAGNOSIS — IMO0002 Reserved for concepts with insufficient information to code with codable children: Secondary | ICD-10-CM | POA: Insufficient documentation

## 2014-01-07 DIAGNOSIS — E119 Type 2 diabetes mellitus without complications: Secondary | ICD-10-CM | POA: Insufficient documentation

## 2014-01-07 DIAGNOSIS — Z792 Long term (current) use of antibiotics: Secondary | ICD-10-CM | POA: Insufficient documentation

## 2014-01-07 DIAGNOSIS — Z8659 Personal history of other mental and behavioral disorders: Secondary | ICD-10-CM | POA: Insufficient documentation

## 2014-01-07 DIAGNOSIS — Z8739 Personal history of other diseases of the musculoskeletal system and connective tissue: Secondary | ICD-10-CM | POA: Insufficient documentation

## 2014-01-07 DIAGNOSIS — R21 Rash and other nonspecific skin eruption: Secondary | ICD-10-CM | POA: Insufficient documentation

## 2014-01-07 DIAGNOSIS — Z8719 Personal history of other diseases of the digestive system: Secondary | ICD-10-CM | POA: Insufficient documentation

## 2014-01-07 DIAGNOSIS — F17200 Nicotine dependence, unspecified, uncomplicated: Secondary | ICD-10-CM | POA: Insufficient documentation

## 2014-01-07 NOTE — ED Notes (Signed)
Pt c/o facial swelling and irritation that began yesterday after applying [employer] for [employer] facial hair dye. Pt denies sob or swallowing difficulties.

## 2014-01-08 MED ORDER — PREDNISONE 20 MG PO TABS: 60.0000 mg | ORAL_TABLET | Freq: Every day | ORAL | Status: DC

## 2014-01-08 MED ORDER — FAMOTIDINE 20 MG PO TABS
20.0000 mg | ORAL_TABLET | Freq: Once | ORAL | Status: AC
  Administered 2014-01-08: 20 mg via ORAL
  Filled 2014-01-08: qty 1

## 2014-01-08 MED ORDER — DIPHENHYDRAMINE HCL 25 MG PO CAPS
25.0000 mg | ORAL_CAPSULE | Freq: Once | ORAL | Status: AC
  Administered 2014-01-08: 25 mg via ORAL
  Filled 2014-01-08: qty 1

## 2014-01-08 MED ORDER — METHYLPREDNISOLONE SODIUM SUCC 125 MG IJ SOLR
125.0000 mg | Freq: Once | INTRAMUSCULAR | Status: AC
  Administered 2014-01-08: 125 mg via INTRAMUSCULAR
  Filled 2014-01-08: qty 2

## 2014-01-08 MED ORDER — RANITIDINE HCL 300 MG PO TABS: 300.0000 mg | ORAL_TABLET | Freq: Two times a day (BID) | ORAL | Status: DC

## 2014-01-08 MED ORDER — DIPHENHYDRAMINE HCL 25 MG PO TABS: 25.0000 mg | ORAL_TABLET | Freq: Four times a day (QID) | ORAL | Status: DC | PRN

## 2014-01-08 NOTE — Discharge Instructions (Signed)

## 2014-01-08 NOTE — ED Provider Notes (Signed)
CSN: [id number]     Arrival date & time 01/07/14  2206 History   First MD Initiated Contact with Patient 01/08/14 0057     Chief Complaint  Patient presents with  . Allergic Reaction     (Consider location/radiation/quality/duration/timing/severity/associated sxs/prior Treatment) Patient is a 48 y.o. male presenting with allergic reaction. The history is provided by the patient. No language interpreter was used.  Allergic Reaction Presenting symptoms: rash and swelling   Presenting symptoms: no difficulty swallowing   Rash:    Location:  Face   Quality: draining, itchiness and redness     Onset quality:  Sudden   Duration:  2 days   Timing:  Constant   Progression:  Unchanged Severity:  Moderate Prior episodes: prior episode to hair dye. Context comment:  Facial hair dye Relieved by:  Nothing Worsened by:  Nothing tried Ineffective treatments: eczema cream.   Past Medical History  Diagnosis Date  . Hypertension   . Diabetes mellitus   . GERD (gastroesophageal reflux disease)   . PTSD (post-traumatic stress disorder)   . Fistula, perirectal   . Small bowel obstruction   . Arthritis    Past Surgical History  Procedure Laterality Date  . Rectal surgery     No family history on file. History  Substance Use Topics  . Smoking status: Current Some Day Smoker  . Smokeless tobacco: Never Used  . Alcohol Use: 1.8 oz/week    3 Cans of beer per week     Comment: drinks heavily approx 5days a week.    Review of Systems  Constitutional: Negative for fever, activity change, appetite change and fatigue.  HENT: Negative for congestion, facial swelling, rhinorrhea and trouble swallowing.   Eyes: Negative for photophobia and pain.  Respiratory: Negative for cough, chest tightness and shortness of breath.   Cardiovascular: Negative for chest pain and leg swelling.  Gastrointestinal: Negative for nausea, vomiting, abdominal pain, diarrhea and constipation.  Endocrine: Negative  for polydipsia and polyuria.  Genitourinary: Negative for dysuria, urgency, decreased urine volume and difficulty urinating.  Musculoskeletal: Negative for back pain and gait problem.  Skin: Positive for rash. Negative for color change and wound.  Allergic/Immunologic: Negative for immunocompromised state.  Neurological: Negative for dizziness, facial asymmetry, speech difficulty, weakness, numbness and headaches.  Psychiatric/Behavioral: Negative for confusion, decreased concentration and agitation.      Allergies  Review of patient's allergies indicates no known allergies.  Home Medications   Prior to Admission medications   Medication Sig Start Date End Date Taking? Authorizing Provider  insulin aspart protamine- aspart (NOVOLOG MIX 70/30) (70-30) 100 UNIT/ML injection Inject into the skin.   Yes Historical Provider, MD  SUMAtriptan (IMITREX) 50 MG tablet Take 50 mg by mouth every 2 (two) hours as needed for migraine or headache. May repeat in 2 hours if headache persists or recurs.   Yes Historical Provider, MD  clindamycin (CLEOCIN) 150 MG capsule Take 2 capsules (300 mg total) by mouth 3 (three) times daily. 01/31/13   Joya Gaskinsonald W Wickline, MD  diphenhydrAMINE (BENADRYL) 25 MG tablet Take 1 tablet (25 mg total) by mouth every 6 (six) hours as needed for itching or allergies. 01/08/14   Shanna CiscoMegan E Aminah Zabawa, MD  glipiZIDE (GLUCOTROL) 10 MG tablet Take 1 tablet (10 mg total) by mouth 2 (two) times daily before a meal. 05/30/11 05/29/12  Carleene CooperAlan Davidson III, MD  lisinopril (PRINIVIL,ZESTRIL) 10 MG tablet Take 10 mg by mouth daily.    Historical Provider, MD  metFORMIN (GLUCOPHAGE)  1000 MG tablet Take 1,000 mg by mouth 2 (two) times daily.    Historical Provider, MD  oxyCODONE-acetaminophen (PERCOCET/ROXICET) 5-325 MG per tablet Take 2 tablets by mouth every 4 (four) hours as needed for pain. 01/31/13   Joya Gaskins, MD  predniSONE (DELTASONE) 20 MG tablet Take 3 tablets (60 mg total) by mouth  daily. 01/08/14   Shanna Cisco, MD  ranitidine (ZANTAC) 300 MG tablet Take 1 tablet (300 mg total) by mouth 2 (two) times daily. 01/08/14   Shanna Cisco, MD   BP 127/82  Pulse 78  Temp(Src) 98.3 F (36.8 C) (Oral)  Resp 18  Ht 5\' 9"  (1.753 m)  Wt 206 lb (93.441 kg)  BMI 30.41 kg/m2  SpO2 100% Physical Exam  Constitutional: He is oriented to person, place, and time. He appears well-developed and well-nourished. No distress.  HENT:  Head: Normocephalic and atraumatic.    Mouth/Throat: No oropharyngeal exudate.  Eyes: Pupils are equal, round, and reactive to light.  Neck: Normal range of motion. Neck supple.  Cardiovascular: Normal rate, regular rhythm and normal heart sounds.  Exam reveals no gallop and no friction rub.   No murmur heard. Pulmonary/Chest: Effort normal and breath sounds normal. No respiratory distress. He has no wheezes. He has no rales.  Abdominal: Soft. Bowel sounds are normal. He exhibits no distension and no mass. There is no tenderness. There is no rebound and no guarding.  Musculoskeletal: Normal range of motion. He exhibits no edema and no tenderness.  Neurological: He is alert and oriented to person, place, and time.  Skin: Skin is warm and dry.  Psychiatric: He has a normal mood and affect.    ED Course  Procedures (including critical care time) Labs Review Labs Reviewed - No data to display  Imaging Review No results found.   EKG Interpretation None      MDM   Final diagnoses:  Allergic reaction, initial encounter    Pt is a 48 y.o. male with Pmhx as above who presents with allergic reaction to [employer] beard dye which he used for the first time yesterday. No systemic s/sx of anaphylaxis. No signs of overlying infections. Will treat w/ 3 days of steroids, benadryl, zantac. Return precautions given for new or worsening symptoms including s/sx of anaphylaxis, acute infection.          Shanna Cisco, MD 01/08/14 [phone]

## 2014-11-12 ENCOUNTER — Emergency Department (HOSPITAL_BASED_OUTPATIENT_CLINIC_OR_DEPARTMENT_OTHER)
Admission: EM | Admit: 2014-11-12 | Discharge: 2014-11-12 | Disposition: A | Discharge status: Home / Self Care | Payer: Medicare Other | Attending: Emergency Medicine | Admitting: Emergency Medicine

## 2014-11-12 ENCOUNTER — Encounter (HOSPITAL_BASED_OUTPATIENT_CLINIC_OR_DEPARTMENT_OTHER): Payer: Self-pay | Admitting: *Deleted

## 2014-11-12 DIAGNOSIS — Z720 Tobacco use: Secondary | ICD-10-CM | POA: Diagnosis not present

## 2014-11-12 DIAGNOSIS — M1990 Unspecified osteoarthritis, unspecified site: Secondary | ICD-10-CM | POA: Diagnosis not present

## 2014-11-12 DIAGNOSIS — Z8659 Personal history of other mental and behavioral disorders: Secondary | ICD-10-CM | POA: Diagnosis not present

## 2014-11-12 DIAGNOSIS — Z79899 Other long term (current) drug therapy: Secondary | ICD-10-CM | POA: Diagnosis not present

## 2014-11-12 DIAGNOSIS — K219 Gastro-esophageal reflux disease without esophagitis: Secondary | ICD-10-CM | POA: Insufficient documentation

## 2014-11-12 DIAGNOSIS — L0291 Cutaneous abscess, unspecified: Secondary | ICD-10-CM

## 2014-11-12 DIAGNOSIS — L0211 Cutaneous abscess of neck: Secondary | ICD-10-CM | POA: Diagnosis present

## 2014-11-12 DIAGNOSIS — Z792 Long term (current) use of antibiotics: Secondary | ICD-10-CM | POA: Insufficient documentation

## 2014-11-12 DIAGNOSIS — Z794 Long term (current) use of insulin: Secondary | ICD-10-CM | POA: Diagnosis not present

## 2014-11-12 DIAGNOSIS — E119 Type 2 diabetes mellitus without complications: Secondary | ICD-10-CM | POA: Diagnosis not present

## 2014-11-12 DIAGNOSIS — I10 Essential (primary) hypertension: Secondary | ICD-10-CM | POA: Insufficient documentation

## 2014-11-12 MED ORDER — LIDOCAINE-EPINEPHRINE (PF) 2 %-1:200000 IJ SOLN
10.0000 mL | Freq: Once | INTRAMUSCULAR | Status: AC
  Administered 2014-11-12: 10 mL
  Filled 2014-11-12: qty 10

## 2014-11-12 MED ORDER — BACITRACIN 500 UNIT/GM EX OINT
1.0000 "application " | TOPICAL_OINTMENT | Freq: Two times a day (BID) | CUTANEOUS | Status: DC
  Filled 2014-11-12: qty 0.9

## 2014-11-12 MED ORDER — SULFAMETHOXAZOLE-TRIMETHOPRIM 800-160 MG PO TABS: 1.0000 | ORAL_TABLET | Freq: Two times a day (BID) | ORAL | Status: AC

## 2014-11-12 NOTE — ED Notes (Signed)
PA at bedside.

## 2014-11-12 NOTE — ED Provider Notes (Signed)
CSN: [id number]     Arrival date & time 11/12/14  1933 History   First MD Initiated Contact with Patient 11/12/14 1941     Chief Complaint  Patient presents with  . Abscess     (Consider location/radiation/quality/duration/timing/severity/associated sxs/prior Treatment) HPI [name] is a 49 y.o. male history of hypertension, diabetes comes in for evaluation of abscess. Patient states he has had an abscess on the back of his neck "for years that comes and goes". Over the past 2 days it has gotten more swollen and tender. He tried to pop it at home, but did not experience relief. Denies any other interventions tried. Denies fevers, chills, nausea or vomiting, neck stiffness. No other aggravating or modifying factors  Past Medical History  Diagnosis Date  . Hypertension   . Diabetes mellitus   . GERD (gastroesophageal reflux disease)   . PTSD (post-traumatic stress disorder)   . Fistula, perirectal   . Small bowel obstruction   . Arthritis    Past Surgical History  Procedure Laterality Date  . Rectal surgery     History reviewed. No pertinent family history. History  Substance Use Topics  . Smoking status: Current Some Day Smoker  . Smokeless tobacco: Never Used  . Alcohol Use: 1.8 oz/week    3 Cans of beer per week     Comment: drinks heavily approx 5days a week.    Review of Systems A 10 point review of systems was completed and was negative except for pertinent positives and negatives as mentioned in the history of present illness     Allergies  Review of patient's allergies indicates no known allergies.  Home Medications   Prior to Admission medications   Medication Sig Start Date End Date Taking? Authorizing Provider  clindamycin (CLEOCIN) 150 MG capsule Take 2 capsules (300 mg total) by mouth 3 (three) times daily. 01/31/13   Zadie Rhineonald Wickline, MD  diphenhydrAMINE (BENADRYL) 25 MG tablet Take 1 tablet (25 mg total) by mouth every 6 (six) hours as needed for  itching or allergies. 01/08/14   Toy CookeyMegan Docherty, MD  glipiZIDE (GLUCOTROL) 10 MG tablet Take 1 tablet (10 mg total) by mouth 2 (two) times daily before a meal. 05/30/11 05/29/12  Carleene CooperAlan Davidson, MD  insulin aspart protamine- aspart (NOVOLOG MIX 70/30) (70-30) 100 UNIT/ML injection Inject into the skin.    Historical Provider, MD  lisinopril (PRINIVIL,ZESTRIL) 10 MG tablet Take 10 mg by mouth daily.    Historical Provider, MD  metFORMIN (GLUCOPHAGE) 1000 MG tablet Take 1,000 mg by mouth 2 (two) times daily.    Historical Provider, MD  oxyCODONE-acetaminophen (PERCOCET/ROXICET) 5-325 MG per tablet Take 2 tablets by mouth every 4 (four) hours as needed for pain. 01/31/13   Zadie Rhineonald Wickline, MD  predniSONE (DELTASONE) 20 MG tablet Take 3 tablets (60 mg total) by mouth daily. 01/08/14   Toy CookeyMegan Docherty, MD  ranitidine (ZANTAC) 300 MG tablet Take 1 tablet (300 mg total) by mouth 2 (two) times daily. 01/08/14   Toy CookeyMegan Docherty, MD  sulfamethoxazole-trimethoprim (BACTRIM DS,SEPTRA DS) 800-160 MG per tablet Take 1 tablet by mouth 2 (two) times daily. 11/12/14 11/19/14  Joycie PeekBenjamin Ziyah Cordoba, PA-C  SUMAtriptan (IMITREX) 50 MG tablet Take 50 mg by mouth every 2 (two) hours as needed for migraine or headache. May repeat in 2 hours if headache persists or recurs.    Historical Provider, MD   BP 138/91 mmHg  Pulse 100  Temp(Src) 98.7 F (37.1 C) (Oral)  Ht 5\' 9"  (1.753 m)  Wt 201 lb (91.173 kg)  BMI 29.67 kg/m2  SpO2 100% Physical Exam  Constitutional:  Awake, alert, nontoxic appearance.  HENT:  Head: Atraumatic.  Eyes: Right eye exhibits no discharge. Left eye exhibits no discharge.  Neck: Neck supple.  Small, superficial abscess noted to posterior neck with surrounding induration and mild cellulitis.  Pulmonary/Chest: Effort normal. He exhibits no tenderness.  Abdominal: Soft. There is no tenderness. There is no rebound.  Musculoskeletal: He exhibits no tenderness.  Baseline ROM, no obvious new focal weakness.   Neurological:  Mental status and motor strength appears baseline for patient and situation.  Skin: No rash noted.  Psychiatric: He has a normal mood and affect.  Nursing note and vitals reviewed.   ED Course  Procedures (including critical care time) INCISION AND DRAINAGE Performed by: Sharlene Mottsartner, [place] W Consent: Verbal consent obtained. Risks and benefits: risks, benefits and alternatives were discussed Type: abscess  Body area: Neck  Anesthesia: local infiltration  Incision was made with a scalpel.  Local anesthetic: lidocaine 2 % with epinephrine  Anesthetic total: 3 ml  Complexity: complex Blunt dissection to break up loculations  Drainage: purulent  Drainage amount: Small   Packing material: None   Patient tolerance: Patient tolerated the procedure well with no immediate complications.    Labs Review Labs Reviewed - No data to display  Imaging Review No results found.   EKG Interpretation None      MDM  Vitals stable - WNL -afebrile Pt resting comfortably in ED. PE--small superficial abscess noted to posterior neck with surrounding induration. Abscesses drained at bedside by myself. Due to patient's history of recurrent abscess as well as diabetes. Will treat with antibiotics. Discussed follow-up with primary care for wound recheck in 3 days, patient amenable to plan. No evidence of other acute or emergent pathologies.  Final diagnoses:  Abscess       Joycie PeekBenjamin Shaft Corigliano, PA-C 11/12/14 2231  Raeford RazorStephen Kohut, MD 11/12/14 [phone]

## 2014-11-12 NOTE — Discharge Instructions (Signed)
Please take your antibiotics as directed and until gone. Please follow-up with your primary care  within 3 days for wound reevaluation. Return to ED for new or worsening symptoms.  Abscess An abscess is an infected area that contains a collection of pus and debris.It can occur in almost any part of the body. An abscess is also known as a furuncle or boil. CAUSES  An abscess occurs when tissue gets infected. This can occur from blockage of oil or sweat glands, infection of hair follicles, or a minor injury to the skin. As the body tries to fight the infection, pus collects in the area and creates pressure under the skin. This pressure causes pain. People with weakened immune systems have difficulty fighting infections and get certain abscesses more often.  SYMPTOMS Usually an abscess develops on the skin and becomes a painful mass that is red, warm, and tender. If the abscess forms under the skin, you may feel a moveable soft area under the skin. Some abscesses break open (rupture) on their own, but most will continue to get worse without care. The infection can spread deeper into the body and eventually into the bloodstream, causing you to feel ill.  DIAGNOSIS  Your caregiver will take your medical history and perform a physical exam. A sample of fluid may also be taken from the abscess to determine what is causing your infection. TREATMENT  Your caregiver may prescribe antibiotic medicines to fight the infection. However, taking antibiotics alone usually does not cure an abscess. Your caregiver may need to make a small cut (incision) in the abscess to drain the pus. In some cases, gauze is packed into the abscess to reduce pain and to continue draining the area. HOME CARE INSTRUCTIONS   Only take over-the-counter or prescription medicines for pain, discomfort, or fever as directed by your caregiver.  If you were prescribed antibiotics, take them as directed. Finish them even if you start to feel  better.  If gauze is used, follow your caregiver's directions for changing the gauze.  To avoid spreading the infection:  Keep your draining abscess covered with a bandage.  Wash your hands well.  Do not share personal care items, towels, or whirlpools with others.  Avoid skin contact with others.  Keep your skin and clothes clean around the abscess.  Keep all follow-up appointments as directed by your caregiver. SEEK MEDICAL CARE IF:   You have increased pain, swelling, redness, fluid drainage, or bleeding.  You have muscle aches, chills, or a general ill feeling.  You have a fever. MAKE SURE YOU:   Understand these instructions.  Will watch your condition.  Will get help right away if you are not doing well or get worse. Document Released: 03/28/2005 Document Revised: 12/18/2011 Document Reviewed: 08/31/2011 [facility] Patient Information 2015 [employer] [place]. This information is not intended to replace advice given to you by your health care provider. Make sure you discuss any questions you have with your health care provider.

## 2014-11-12 NOTE — ED Notes (Signed)
Pt c/o abscess to neck x 2 days 

## 2015-04-20 ENCOUNTER — Inpatient Hospital Stay
Admit: 2015-04-20 | Discharge: 2015-04-21 | Disposition: A | Discharge status: Home / Self Care | Payer: MEDICARE | Attending: Emergency Medicine

## 2015-04-20 ENCOUNTER — Emergency Department: Admit: 2015-04-21 | Payer: MEDICARE | Primary: Internal Medicine

## 2015-04-20 DIAGNOSIS — M5442 Lumbago with sciatica, left side: Secondary | ICD-10-CM

## 2015-04-20 NOTE — ED Triage Notes (Signed)
Formatting of this note might be different from the original.  Patient presents with what he describes as sciatica to the left lower back and LLE.   Electronically signed by Kristian Coveylark, Tony S, RN at 04/20/2015  8:08 PM EDT

## 2015-04-20 NOTE — ED Notes (Signed)
Formatting of this note might be different from the original.  D/c pt home with rx x1. Verbalizes understanding of d/c instructions  Electronically signed by Lemmie Evenslavette, Danielle F, RN at 04/20/2015  9:58 PM EDT

## 2015-04-20 NOTE — ED Provider Notes (Signed)
Formatting of this note is different from the original.  HPI Comments: Patient is a 49 year old male with past history significant for diabetes and recurrent low back pain presents with left-sided lower back pain.  Patient states he has pain that stretches from the right to the left, the sinus pain is on the left.  States "I just want to get that shot yall give".  Denies any fevers or chills, no nausea or vomiting, no changes in urinary habits or bowel habits, no numbness or tingling of his general region, no weakness of his life, does state significant pain on the left and pain with ambulation radiation down his left thigh.  States pain worse with bending, twisting, and ambulation.    Patient is a 49 y.o. male presenting with back pain. The history is provided by the patient. No language interpreter was used.   Back Pain    This is a recurrent problem. The current episode started more than 1 week ago. The problem has been gradually worsening. The problem occurs constantly. Patient reports not work related injury.The pain is associated with no known injury. Pertinent negatives include no chest pain, no fever, no headaches, no abdominal pain, no dysuria and no weakness.       Past Medical History:   Diagnosis Date   ? Diabetes (HCC)    ? Hypertension    ? Psychiatric disorder      PTSD     Past Surgical History:   Procedure Laterality Date   ? Hx other surgical       anal fissure       History reviewed. No pertinent family history.    Social History     Social History   ? Marital status: SINGLE     Spouse name: N/A   ? Number of children: N/A   ? Years of education: N/A     Occupational History   ? Not on file.     Social History Main Topics   ? Smoking status: Former Smoker   ? Smokeless tobacco: Not on file   ? Alcohol use Yes      Comment: occas   ? Drug use: No   ? Sexual activity: Not on file     Other Topics Concern   ? Not on file     Social History Narrative     ALLERGIES: Review of patient's allergies  indicates no known allergies.    Review of Systems   Constitutional: Negative for chills and fever.   HENT: Negative for rhinorrhea and sore throat.    Eyes: Negative for visual disturbance.   Respiratory: Negative for cough and shortness of breath.    Cardiovascular: Negative for chest pain and leg swelling.   Gastrointestinal: Negative for abdominal pain, diarrhea, nausea and vomiting.   Genitourinary: Negative for dysuria.   Musculoskeletal: Positive for back pain. Negative for neck pain.   Skin: Negative for rash.   Neurological: Negative for weakness and headaches.   Psychiatric/Behavioral: The patient is not nervous/anxious.      Vitals:    04/20/15 2010   BP: (!) 148/93   Pulse: 83   Resp: 16   Temp: 98.3 F (36.8 C)   SpO2: 97%   Weight: 91.2 kg (201 lb)   Height:  (1.753 m)       Physical Exam   Constitutional: He is oriented to person, place, and time. He appears well-developed and well-nourished.   HENT:   Head: Normocephalic.  Right Ear: External ear normal.   Left Ear: External ear normal.   Eyes: Conjunctivae and EOM are normal. Pupils are equal, round, and reactive to light.   Neck: Normal range of motion. Neck supple. No tracheal deviation present.   Cardiovascular: Normal rate, regular rhythm, normal heart sounds and intact distal pulses.    No murmur heard.  Pulmonary/Chest: Effort normal and breath sounds normal. No respiratory distress.   Abdominal: Soft. There is no tenderness.   Musculoskeletal: Normal range of motion. He exhibits tenderness.   Tenderness to palpation of left paraspinal muscles, pain with bending, twisting. strength 5 over 5 bilateral lower injuries, DTRs 2+ bilaterally.   Neurological: He is alert and oriented to person, place, and time. No cranial nerve deficit.   Skin: No rash noted.   Nursing note and vitals reviewed.      MDM  Number of Diagnoses or Management Options  Chronic left-sided low back pain with left-sided sciatica: new and requires workup    Amount  and/or Complexity of Data Reviewed  Clinical lab tests: ordered and reviewed  Tests in the radiology section of CPT: ordered and reviewed  Review and summarize past medical records: yes    Risk of Complications, Morbidity, and/or Mortality  Presenting problems: low  Diagnostic procedures: low  Management options: low    Patient Progress  Patient progress: stable    ED Course     Procedures    Xr Spine Lumb 2 Or 3 V    Result Date: 04/20/2015  Exam: AP, lateral, and collimated radiographs of the lumbar spine Indication: Intermittent lower back pain, acutely for 3 days, no known injury Comparison: None available Findings: No evidence of acute fracture or subluxation. 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 Minimal degenerative change in the lower thoracic spine. Bilateral SI joints are symmetric and unremarkable. No radiopaque foreign body or gross soft tissue abnormality.     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     49 year old male with back pain:    Imaging negative as above, urine without concern for infection and no blood present.  Patients pain worse with bending/twisting and movement and painful to palpation of paraspinal muscles and likely musculoskeletal in nature.  Reviewed red flags with patient and all negative, specifically no immunosuppression, no IVDA, no fevers or chills, no changes in urination or bowel habits, no weakness of legs or any further concerns. Well appearing, has PCP which he states he will follow up with in 3-4 days for repeat evaluation or return with above concerns we discussed.        Electronically signed by Dalene Seltzerrumpler, Jason J, MD at 04/20/2015 11:35 PM EDT

## 2015-04-20 NOTE — ED Notes (Signed)
Formatting of this note might be different from the original.  C/o back pain radiating down left leg. Ambulatory to room  Electronically signed by Lemmie Evenslavette, Danielle F, RN at 04/20/2015  8:25 PM EDT

## 2015-04-20 NOTE — ED Notes (Signed)
C/o back pain radiating down left leg. Ambulatory to room

## 2015-04-20 NOTE — ED Triage Notes (Signed)
Patient presents with what he describes as sciatica to the left lower back and LLE.

## 2015-04-20 NOTE — ED Provider Notes (Signed)
HPI Comments: Patient is a 49 year old male with past history significant for diabetes and recurrent low back pain presents with left-sided lower back pain.  Patient states he has pain that stretches from the right to the left, the sinus pain is on the left.  States "I just want to get that shot yall give".  Denies any fevers or chills, no nausea or vomiting, no changes in urinary habits or bowel habits, no numbness or tingling of his general region, no weakness of his life, does state significant pain on the left and pain with ambulation radiation down his left thigh.  States pain worse with bending, twisting, and ambulation.    Patient is a 49 y.o. male presenting with back pain. The history is provided by the patient. No language interpreter was used.   Back Pain    This is a recurrent problem. The current episode started more than 1 week ago. The problem has been gradually worsening. The problem occurs constantly. Patient reports not work related injury.The pain is associated with no known injury. Pertinent negatives include no chest pain, no fever, no headaches, no abdominal pain, no dysuria and no weakness.        Past Medical History:   Diagnosis Date   ??? Diabetes (HCC)    ??? Hypertension    ??? Psychiatric disorder      PTSD       Past Surgical History:   Procedure Laterality Date   ??? Hx other surgical       anal fissure         History reviewed. No pertinent family history.    Social History     Social History   ??? Marital status: SINGLE     Spouse name: N/A   ??? Number of children: N/A   ??? Years of education: N/A     Occupational History   ??? Not on file.     Social History Main Topics   ??? Smoking status: Former Smoker   ??? Smokeless tobacco: Not on file   ??? Alcohol use Yes      Comment: occas   ??? Drug use: No   ??? Sexual activity: Not on file     Other Topics Concern   ??? Not on file     Social History Narrative         ALLERGIES: Review of patient's allergies indicates no known allergies.    Review of Systems    Constitutional: Negative for chills and fever.   HENT: Negative for rhinorrhea and sore throat.    Eyes: Negative for visual disturbance.   Respiratory: Negative for cough and shortness of breath.    Cardiovascular: Negative for chest pain and leg swelling.   Gastrointestinal: Negative for abdominal pain, diarrhea, nausea and vomiting.   Genitourinary: Negative for dysuria.   Musculoskeletal: Positive for back pain. Negative for neck pain.   Skin: Negative for rash.   Neurological: Negative for weakness and headaches.   Psychiatric/Behavioral: The patient is not nervous/anxious.        Vitals:    04/20/15 2010   BP: (!) 148/93   Pulse: 83   Resp: 16   Temp: 98.3 ??F (36.8 ??C)   SpO2: 97%   Weight: 91.2 kg (201 lb)   Height: 5\' 9"  (1.753 m)            Physical Exam   Constitutional: He is oriented to person, place, and time. He appears well-developed and well-nourished.   HENT:  Head: Normocephalic.   Right Ear: External ear normal.   Left Ear: External ear normal.   Eyes: Conjunctivae and EOM are normal. Pupils are equal, round, and reactive to light.   Neck: Normal range of motion. Neck supple. No tracheal deviation present.   Cardiovascular: Normal rate, regular rhythm, normal heart sounds and intact distal pulses.    No murmur heard.  Pulmonary/Chest: Effort normal and breath sounds normal. No respiratory distress.   Abdominal: Soft. There is no tenderness.   Musculoskeletal: Normal range of motion. He exhibits tenderness.   Tenderness to palpation of left paraspinal muscles, pain with bending, twisting. strength 5 over 5 bilateral lower injuries, DTRs 2+ bilaterally.   Neurological: He is alert and oriented to person, place, and time. No cranial nerve deficit.   Skin: No rash noted.   Nursing note and vitals reviewed.       MDM  Number of Diagnoses or Management Options  Chronic left-sided low back pain with left-sided sciatica: new and requires workup     Amount and/or Complexity of Data Reviewed   Clinical lab tests: ordered and reviewed  Tests in the radiology section of CPT??: ordered and reviewed  Review and summarize past medical records: yes    Risk of Complications, Morbidity, and/or Mortality  Presenting problems: low  Diagnostic procedures: low  Management options: low    Patient Progress  Patient progress: stable    ED Course       Procedures    Xr Spine Lumb 2 Or 3 V    Result Date: 04/20/2015  Exam: AP, lateral, and collimated radiographs of the lumbar spine Indication: Intermittent lower back pain, acutely for 3 days, no known injury Comparison: None available Findings: No evidence of acute fracture or subluxation. 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 Minimal degenerative change in the lower thoracic spine. Bilateral SI joints are symmetric and unremarkable. No radiopaque foreign body or gross soft tissue abnormality.     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       49 year old male with back pain:    Imaging negative as above, urine without concern for infection and no blood present.  Patients pain worse with bending/twisting and movement and painful to palpation of paraspinal muscles and likely musculoskeletal in nature.  Reviewed red flags with patient and all negative, specifically no immunosuppression, no IVDA, no fevers or chills, no changes in urination or bowel habits, no weakness of legs or any further concerns. Well appearing, has PCP which he states he will follow up with in 3-4 days for repeat evaluation or return with above concerns we discussed.

## 2015-04-20 NOTE — ED Notes (Signed)
D/c pt home with rx x1. Verbalizes understanding of d/c instructions

## 2015-04-21 MED ORDER — OXYCODONE 5 MG TAB
5 mg | ORAL | Status: AC
Start: 2015-04-21 — End: 2015-04-20
  Administered 2015-04-21: 01:00:00 via ORAL

## 2015-04-21 MED ORDER — TRAMADOL 50 MG TAB
50 mg | ORAL_TABLET | Freq: Three times a day (TID) | ORAL | 0 refills | Status: AC | PRN
Start: 2015-04-21 — End: ?

## 2015-04-21 MED FILL — OXYCODONE 5 MG TAB: 5 mg | ORAL | Qty: 1

## 2016-02-08 DIAGNOSIS — M5136 Other intervertebral disc degeneration, lumbar region: Secondary | ICD-10-CM | POA: Insufficient documentation

## 2016-07-26 ENCOUNTER — Encounter (HOSPITAL_COMMUNITY): Payer: Self-pay

## 2016-08-23 ENCOUNTER — Ambulatory Visit (HOSPITAL_COMMUNITY): Payer: Medicare Other | Admitting: Psychiatry

## 2016-08-27 ENCOUNTER — Ambulatory Visit (HOSPITAL_COMMUNITY): Payer: Medicare Other | Admitting: Psychiatry

## 2016-08-30 ENCOUNTER — Ambulatory Visit (HOSPITAL_COMMUNITY): Payer: Medicare Other | Admitting: Psychology

## 2016-09-14 ENCOUNTER — Ambulatory Visit (HOSPITAL_COMMUNITY): Payer: Medicare Other | Admitting: Psychiatry

## 2016-09-24 ENCOUNTER — Encounter (HOSPITAL_COMMUNITY): Payer: Self-pay | Admitting: Psychology

## 2016-09-24 ENCOUNTER — Ambulatory Visit (HOSPITAL_COMMUNITY): Payer: Medicare Other | Admitting: Psychology

## 2016-09-24 NOTE — Progress Notes (Signed)
Christian DivineJohn [name] is a 51 y.o. male patient who didn't show for his appointment.  This is the 3rd intake appointment pt hasn't made or arrived too late for.          Forde RadonYATES,LEANNE, LPC

## 2019-04-14 ENCOUNTER — Encounter (HOSPITAL_BASED_OUTPATIENT_CLINIC_OR_DEPARTMENT_OTHER): Payer: Self-pay

## 2019-04-14 ENCOUNTER — Other Ambulatory Visit: Payer: Self-pay

## 2019-04-14 ENCOUNTER — Emergency Department (HOSPITAL_BASED_OUTPATIENT_CLINIC_OR_DEPARTMENT_OTHER)
Admission: EM | Admit: 2019-04-14 | Discharge: 2019-04-14 | Disposition: A | Discharge status: Home / Self Care | Payer: Medicare Other | Attending: Emergency Medicine | Admitting: Emergency Medicine

## 2019-04-14 DIAGNOSIS — Z794 Long term (current) use of insulin: Secondary | ICD-10-CM | POA: Insufficient documentation

## 2019-04-14 DIAGNOSIS — E1165 Type 2 diabetes mellitus with hyperglycemia: Secondary | ICD-10-CM | POA: Insufficient documentation

## 2019-04-14 DIAGNOSIS — R197 Diarrhea, unspecified: Secondary | ICD-10-CM | POA: Diagnosis not present

## 2019-04-14 DIAGNOSIS — Z79899 Other long term (current) drug therapy: Secondary | ICD-10-CM | POA: Insufficient documentation

## 2019-04-14 DIAGNOSIS — Z20828 Contact with and (suspected) exposure to other viral communicable diseases: Secondary | ICD-10-CM | POA: Insufficient documentation

## 2019-04-14 DIAGNOSIS — Z87891 Personal history of nicotine dependence: Secondary | ICD-10-CM | POA: Insufficient documentation

## 2019-04-14 DIAGNOSIS — I10 Essential (primary) hypertension: Secondary | ICD-10-CM | POA: Diagnosis not present

## 2019-04-14 DIAGNOSIS — R739 Hyperglycemia, unspecified: Secondary | ICD-10-CM

## 2019-04-14 DIAGNOSIS — R55 Syncope and collapse: Secondary | ICD-10-CM | POA: Diagnosis present

## 2019-04-14 LAB — CBC
HCT: 46.5 % (ref 39.0–52.0)
Hemoglobin: 16.5 g/dL (ref 13.0–17.0)
MCH: 31.1 pg (ref 26.0–34.0)
MCHC: 35.5 g/dL (ref 30.0–36.0)
MCV: 87.7 fL (ref 80.0–100.0)
Platelets: 239 10*3/uL (ref 150–400)
RBC: 5.3 MIL/uL (ref 4.22–5.81)
RDW: 11.8 % (ref 11.5–15.5)
WBC: 4.1 10*3/uL (ref 4.0–10.5)
nRBC: 0 % (ref 0.0–0.2)

## 2019-04-14 LAB — CBG MONITORING, ED
Glucose-Capillary: 581 mg/dL (ref 70–99)
Glucose-Capillary: 466 mg/dL — ABNORMAL HIGH (ref 70–99)

## 2019-04-14 LAB — URINALYSIS, MICROSCOPIC (REFLEX): WBC, UA: NONE SEEN WBC/hpf (ref 0–5)

## 2019-04-14 LAB — BASIC METABOLIC PANEL
Anion gap: 15 (ref 5–15)
BUN: 20 mg/dL (ref 6–20)
CO2: 22 mmol/L (ref 22–32)
Calcium: 9.1 mg/dL (ref 8.9–10.3)
Chloride: 87 mmol/L — ABNORMAL LOW (ref 98–111)
Creatinine, Ser: 1.13 mg/dL (ref 0.61–1.24)
GFR calc Af Amer: >60 mL/min (ref >60)
GFR calc non Af Amer: >60 mL/min (ref >60)
Glucose, Bld: 552 mg/dL (ref 70–99)
Potassium: 4.2 mmol/L (ref 3.5–5.1)
Sodium: 124 mmol/L — ABNORMAL LOW (ref 135–145)

## 2019-04-14 LAB — URINALYSIS, ROUTINE W REFLEX MICROSCOPIC
Bilirubin Urine: NEGATIVE
Glucose, UA: >=500 mg/dL — AB
Ketones, ur: NEGATIVE mg/dL
Leukocytes,Ua: NEGATIVE
Nitrite: NEGATIVE
Protein, ur: NEGATIVE mg/dL
Specific Gravity, Urine: <1.005 — ABNORMAL LOW (ref 1.005–1.030)
pH: 6 (ref 5.0–8.0)

## 2019-04-14 MED ORDER — INSULIN ASPART 100 UNIT/ML IV SOLN
15.0000 [IU] | Freq: Once | INTRAVENOUS | Status: AC
  Administered 2019-04-14: 15 [IU] via INTRAVENOUS
  Filled 2019-04-14: qty 1

## 2019-04-14 MED ORDER — SODIUM CHLORIDE 0.9% FLUSH
3.0000 mL | Freq: Once | INTRAVENOUS | Status: DC
  Filled 2019-04-14: qty 3

## 2019-04-14 MED ORDER — SODIUM CHLORIDE 0.9 % IV BOLUS
2000.0000 mL | Freq: Once | INTRAVENOUS | Status: AC
  Administered 2019-04-14: 20:00:00 2000 mL via INTRAVENOUS

## 2019-04-14 NOTE — ED Provider Notes (Addendum)
[facility] Provider Note   CSN: [id number] Arrival date & time: 04/14/19  1909     History   Chief Complaint Chief Complaint  Patient presents with  . Near Syncope    HPI [name] is a 53 y.o. male.     53 yo M with a chief complaint of feeling like he may pass out.  This been going on for the past couple days.  Patient was recently started on a new diabetes medicine, after which she had profound diarrhea and started having high blood sugars.  He also feels that he is partially lost his sense of taste.  Has had a very mild cough.  No vomiting.   The history is provided by the patient and the spouse.  Near Syncope This is a new problem. The current episode started more than 2 days ago. The problem occurs constantly. The problem has been gradually worsening. Pertinent negatives include no chest pain, no abdominal pain, no headaches and no shortness of breath. Nothing aggravates the symptoms. Nothing relieves the symptoms. He has tried nothing for the symptoms. The treatment provided no relief.    Past Medical History:  Diagnosis Date  . Arthritis   . Diabetes mellitus   . Fistula, perirectal   . GERD (gastroesophageal reflux disease)   . Hypertension   . PTSD (post-traumatic stress disorder)   . Small bowel obstruction (HCC)     There are no active problems to display for this patient.   Past Surgical History:  Procedure Laterality Date  . RECTAL SURGERY          Home Medications    Prior to Admission medications   Medication Sig Start Date End Date Taking? Authorizing Provider  amLODipine (NORVASC) 10 MG tablet Take 10 mg by mouth daily.   Yes [provider]  Dulaglutide (TRULICITY [place]) Inject into the skin.   Yes [provider]  escitalopram (LEXAPRO) 10 MG tablet Take 10 mg by mouth daily.   Yes [provider]  HYDROCHLOROTHIAZIDE PO Take by mouth.   Yes [provider]  losartan  (COZAAR) 100 MG tablet Take 100 mg by mouth daily.   Yes [provider]  OMEPRAZOLE PO Take by mouth.   Yes [provider]  oxycodone (OXY-IR) 5 MG capsule Take 10 mg by mouth every 4 (four) hours as needed.   Yes [provider]  QUEtiapine (SEROQUEL) 25 MG tablet Take 25 mg by mouth at bedtime.   Yes [provider]  testosterone cypionate (DEPOTESTOTERONE CYPIONATE) 100 MG/ML injection Inject 100 mg into the muscle every 14 (fourteen) days. For IM use only   Yes [provider]  Zolpidem Tartrate (AMBIEN PO) Take by mouth.   Yes [provider]  clindamycin (CLEOCIN) 150 MG capsule Take 2 capsules (300 mg total) by mouth 3 (three) times daily. 01/31/13   Ripley Fraise, MD  diphenhydrAMINE (BENADRYL) 25 MG tablet Take 1 tablet (25 mg total) by mouth every 6 (six) hours as needed for itching or allergies. 01/08/14   Ernestina Patches, MD  glipiZIDE (GLUCOTROL) 10 MG tablet Take 1 tablet (10 mg total) by mouth 2 (two) times daily before a meal. 05/30/11 05/29/12  Mylinda Latina, MD  insulin aspart protamine- aspart (NOVOLOG MIX 70/30) (70-30) 100 UNIT/ML injection Inject into the skin.    [provider]  lisinopril (PRINIVIL,ZESTRIL) 10 MG tablet Take 10 mg by mouth daily.    [provider]  metFORMIN (GLUCOPHAGE)  1000 MG tablet Take 1,000 mg by mouth 2 (two) times daily.    [provider]  oxyCODONE-acetaminophen (PERCOCET/ROXICET) 5-325 MG per tablet Take 2 tablets by mouth every 4 (four) hours as needed for pain. 01/31/13   Zadie Rhine, MD  predniSONE (DELTASONE) 20 MG tablet Take 3 tablets (60 mg total) by mouth daily. 01/08/14   Toy Cookey, MD  ranitidine (ZANTAC) 300 MG tablet Take 1 tablet (300 mg total) by mouth 2 (two) times daily. 01/08/14   Toy Cookey, MD  SUMAtriptan (IMITREX) 50 MG tablet Take 50 mg by mouth every 2 (two) hours as needed for migraine or headache. May repeat in 2 hours if  headache persists or recurs.    [provider]    Family History No family history on file.  Social History Social History   Tobacco Use  . Smoking status: Former Games developer  . Smokeless tobacco: Never Used  Substance Use Topics  . Alcohol use: Yes    Comment: occ  . Drug use: No     Allergies   Patient has no known allergies.   Review of Systems Review of Systems  Constitutional: Negative for chills and fever.  HENT: Negative for congestion and facial swelling.   Eyes: Negative for discharge and visual disturbance.  Respiratory: Negative for shortness of breath.   Cardiovascular: Positive for near-syncope. Negative for chest pain and palpitations.  Gastrointestinal: Negative for abdominal pain, diarrhea and vomiting.  Musculoskeletal: Negative for arthralgias and myalgias.  Skin: Negative for color change and rash.  Neurological: Positive for syncope (near). Negative for tremors and headaches.  Psychiatric/Behavioral: Negative for confusion and dysphoric mood.  53 yo M    Physical Exam Updated Vital Signs BP 126/89 (BP Location: Left Arm)   Pulse 93   Temp 98.7 F (37.1 C) (Oral)   Resp 16   Ht 5\' 9"  (1.753 m)   Wt 89.8 kg   SpO2 97%   BMI 29.24 kg/m   Physical Exam Vitals signs and nursing note reviewed.  Constitutional:      Appearance: He is well-developed. He is obese.  HENT:     Head: Normocephalic and atraumatic.  Eyes:     Pupils: Pupils are equal, round, and reactive to light.  Neck:     Musculoskeletal: Normal range of motion and neck supple.     Vascular: No JVD.  Cardiovascular:     Rate and Rhythm: Regular rhythm. Tachycardia present.     Heart sounds: No murmur. No friction rub. No gallop.   Pulmonary:     Effort: No respiratory distress.     Breath sounds: No wheezing.  Abdominal:     General: There is no distension.     Tenderness: There is no guarding or rebound.  Musculoskeletal: Normal range of motion.  Skin:     Coloration: Skin is not pale.     Findings: No rash.  Neurological:     Mental Status: He is alert and oriented to person, place, and time.  Psychiatric:        Behavior: Behavior normal.      ED Treatments / Results  Labs (all labs ordered are listed, but only abnormal results are displayed) Labs Reviewed  BASIC METABOLIC PANEL - Abnormal; Notable for the following components:      Result Value   Sodium 124 (*)    Chloride 87 (*)    Glucose, Bld 552 (*)    All other components within normal limits  URINALYSIS,  ROUTINE W REFLEX MICROSCOPIC - Abnormal; Notable for the following components:   Specific Gravity, Urine <1.005 (*)    Glucose, UA >=500 (*)    Hgb urine dipstick SMALL (*)    All other components within normal limits  URINALYSIS, MICROSCOPIC (REFLEX) - Abnormal; Notable for the following components:   Bacteria, UA RARE (*)    All other components within normal limits  CBG MONITORING, ED - Abnormal; Notable for the following components:   Glucose-Capillary 581 (*)    All other components within normal limits  CBG MONITORING, ED - Abnormal; Notable for the following components:   Glucose-Capillary 466 (*)    All other components within normal limits  NOVEL CORONAVIRUS, NAA (HOSP ORDER, SEND-OUT TO REF LAB; TAT 18-24 HRS)  CBC    EKG EKG Interpretation  Date/Time:  Tuesday April 14 2019 19:22:19 EDT Ventricular Rate:  117 PR Interval:  160 QRS Duration: 80 QT Interval:  304 QTC Calculation: 424 R Axis:   39 Text Interpretation:  Sinus tachycardia Otherwise normal ECG Rate faster no wpw, prolonged qt or brugada Otherwise no significant change Confirmed by Melene Plan [phone]) on 04/14/2019 7:55:42 PM   Radiology No results found.  Procedures Procedures (including critical care time)  Medications Ordered in ED Medications  sodium chloride flush (NS) 0.9 % injection 3 mL (3 mLs Intravenous Not Given 04/14/19 1958)  sodium chloride 0.9 % bolus 2,000 mL (0  mLs Intravenous Stopped 04/14/19 2221)  insulin aspart (novoLOG) injection 15 Units (15 Units Intravenous Given 04/14/19 2203)     Initial Impression / Assessment and Plan / ED Course  I have reviewed the triage vital signs and the nursing notes.  Pertinent labs & imaging results that were available during my care of the patient were reviewed by me and considered in my medical decision making (see chart for details).        53 yo M with a chief complaints of diarrhea and feeling like he may pass out.  Sounds like the patient is likely hypovolemic from accommodation of diarrhea and hyperglycemia.  We will give 2 L of IV fluids.  Lab work to assess for diabetic ketoacidosis.  Reassess.  Patient is feeling much better on reassessment.  Blood sugar is improved.  Give a bolus of insulin.  Metabolic panel without acidosis, no anion gap no ketones in the urine.  Not consistent with DKA.  Will discharge the patient home.  Patient was describing some decrease sensation of taste.  As this is occurring during the novel coronavirus pandemic that is 1 of the more specific signs for it.  Discussed this with him and will obtain a test prior to his discharge.  CRITICAL CARE Performed by: Rae Roam   Total critical care time: 35 minutes  Critical care time was exclusive of separately billable procedures and treating other patients.  Critical care was necessary to treat or prevent imminent or life-threatening deterioration.  Critical care was time spent personally by me on the following activities: development of treatment plan with patient and/or surrogate as well as nursing, discussions with consultants, evaluation of patient's response to treatment, examination of patient, obtaining history from patient or surrogate, ordering and performing treatments and interventions, ordering and review of laboratory studies, ordering and review of radiographic studies, pulse oximetry and re-evaluation  of patient's condition.   [name] was evaluated in Emergency Department on 04/14/2019 for the symptoms described in the history of present illness. He/she was evaluated in the context  of the global COVID-19 pandemic, which necessitated consideration that the patient might be at risk for infection with the SARS-CoV-2 virus that causes COVID-19. Institutional protocols and algorithms that pertain to the evaluation of patients at risk for COVID-19 are in a state of rapid change based on information released by regulatory bodies including the CDC and federal and state organizations. These policies and algorithms were followed during the patient's care in the ED.  10:21 PM:  I have discussed the diagnosis/risks/treatment options with the patient and family and believe the pt to be eligible for discharge home to follow-up with PCP. We also discussed returning to the ED immediately if new or worsening sx occur. We discussed the sx which are most concerning (e.g., sudden worsening pain, fever, inability to tolerate by mouth) that necessitate immediate return. Medications administered to the patient during their visit and any new prescriptions provided to the patient are listed below.  Medications given during this visit Medications  sodium chloride flush (NS) 0.9 % injection 3 mL (3 mLs Intravenous Not Given 04/14/19 1958)  sodium chloride 0.9 % bolus 2,000 mL (0 mLs Intravenous Stopped 04/14/19 2221)  insulin aspart (novoLOG) injection 15 Units (15 Units Intravenous Given 04/14/19 2203)     The patient appears reasonably screen and/or stabilized for discharge and I doubt any other medical condition or other [facility] requiring further screening, evaluation, or treatment in the ED at this time prior to discharge.    Final Clinical Impressions(s) / ED Diagnoses   Final diagnoses:  Hyperglycemia  Diarrhea, unspecified type    ED Discharge Orders    None       Melene PlanFloyd, Johntavious Francom, DO 04/14/19 2221     Melene PlanFloyd, Kelly Ranieri, DO 04/21/19 [phone]

## 2019-04-14 NOTE — ED Notes (Signed)
Date and time results received: 04/14/19 2054 (use smartphrase ".now" to insert current time)  Test: glucose Critical Value: 552  Name of Provider Notified: Dr. Tyrone Nine  Orders Received? Or Actions Taken?: no new orders

## 2019-04-14 NOTE — ED Triage Notes (Addendum)
Pt c/o near syncopal episode ~7pm-states his BS have been high since last week-last reading "high"-states he was out of insulin recently and was started on a new med that he was waiting for the [place] to approve-NAD-steady gait

## 2019-04-14 NOTE — Discharge Instructions (Signed)
Please call your family doctor and discuss your trouble controlling her blood sugar.  See when they want to see you in the office and see if they want you to continue your current medication regimen.

## 2019-04-16 LAB — NOVEL CORONAVIRUS, NAA (HOSP ORDER, SEND-OUT TO REF LAB; TAT 18-24 HRS): SARS-CoV-2, NAA: NOT DETECTED

## 2019-12-13 ENCOUNTER — Encounter (HOSPITAL_BASED_OUTPATIENT_CLINIC_OR_DEPARTMENT_OTHER): Payer: Self-pay | Admitting: Emergency Medicine

## 2019-12-13 ENCOUNTER — Other Ambulatory Visit: Payer: Self-pay

## 2019-12-13 ENCOUNTER — Emergency Department (HOSPITAL_BASED_OUTPATIENT_CLINIC_OR_DEPARTMENT_OTHER)
Admission: EM | Admit: 2019-12-13 | Discharge: 2019-12-13 | Disposition: A | Discharge status: Home / Self Care | Payer: Medicare Other | Attending: Emergency Medicine | Admitting: Emergency Medicine

## 2019-12-13 ENCOUNTER — Emergency Department (HOSPITAL_BASED_OUTPATIENT_CLINIC_OR_DEPARTMENT_OTHER): Payer: Medicare Other

## 2019-12-13 DIAGNOSIS — N400 Enlarged prostate without lower urinary tract symptoms: Secondary | ICD-10-CM

## 2019-12-13 DIAGNOSIS — R300 Dysuria: Secondary | ICD-10-CM | POA: Diagnosis present

## 2019-12-13 DIAGNOSIS — N401 Enlarged prostate with lower urinary tract symptoms: Secondary | ICD-10-CM | POA: Insufficient documentation

## 2019-12-13 DIAGNOSIS — F17290 Nicotine dependence, other tobacco product, uncomplicated: Secondary | ICD-10-CM | POA: Insufficient documentation

## 2019-12-13 DIAGNOSIS — Z9049 Acquired absence of other specified parts of digestive tract: Secondary | ICD-10-CM | POA: Insufficient documentation

## 2019-12-13 DIAGNOSIS — I10 Essential (primary) hypertension: Secondary | ICD-10-CM | POA: Diagnosis not present

## 2019-12-13 DIAGNOSIS — Z79899 Other long term (current) drug therapy: Secondary | ICD-10-CM | POA: Insufficient documentation

## 2019-12-13 DIAGNOSIS — Z20822 Contact with and (suspected) exposure to covid-19: Secondary | ICD-10-CM | POA: Diagnosis not present

## 2019-12-13 DIAGNOSIS — E119 Type 2 diabetes mellitus without complications: Secondary | ICD-10-CM | POA: Insufficient documentation

## 2019-12-13 DIAGNOSIS — N390 Urinary tract infection, site not specified: Secondary | ICD-10-CM | POA: Diagnosis not present

## 2019-12-13 LAB — BASIC METABOLIC PANEL
Anion gap: 10 (ref 5–15)
BUN: 14 mg/dL (ref 6–20)
CO2: 23 mmol/L (ref 22–32)
Calcium: 8.8 mg/dL — ABNORMAL LOW (ref 8.9–10.3)
Chloride: 98 mmol/L (ref 98–111)
Creatinine, Ser: 1.1 mg/dL (ref 0.61–1.24)
GFR calc Af Amer: >60 mL/min (ref >60)
GFR calc non Af Amer: >60 mL/min (ref >60)
Glucose, Bld: 212 mg/dL — ABNORMAL HIGH (ref 70–99)
Potassium: 4 mmol/L (ref 3.5–5.1)
Sodium: 131 mmol/L — ABNORMAL LOW (ref 135–145)

## 2019-12-13 LAB — URINALYSIS, ROUTINE W REFLEX MICROSCOPIC
Bilirubin Urine: NEGATIVE
Glucose, UA: 100 mg/dL — AB
Ketones, ur: NEGATIVE mg/dL
Nitrite: NEGATIVE
Protein, ur: NEGATIVE mg/dL
Specific Gravity, Urine: 1.01 (ref 1.005–1.030)
pH: 7 (ref 5.0–8.0)

## 2019-12-13 LAB — CBC WITH DIFFERENTIAL/PLATELET
Abs Immature Granulocytes: 0.04 10*3/uL (ref 0.00–0.07)
Basophils Absolute: 0 10*3/uL (ref 0.0–0.1)
Basophils Relative: 0 %
Eosinophils Absolute: 0 10*3/uL (ref 0.0–0.5)
Eosinophils Relative: 0 %
HCT: 45.6 % (ref 39.0–52.0)
Hemoglobin: 16 g/dL (ref 13.0–17.0)
Immature Granulocytes: 0 %
Lymphocytes Relative: 4 %
Lymphs Abs: 0.4 10*3/uL — ABNORMAL LOW (ref 0.7–4.0)
MCH: 31.8 pg (ref 26.0–34.0)
MCHC: 35.1 g/dL (ref 30.0–36.0)
MCV: 90.7 fL (ref 80.0–100.0)
Monocytes Absolute: 0.5 10*3/uL (ref 0.1–1.0)
Monocytes Relative: 5 %
Neutro Abs: 8.9 10*3/uL — ABNORMAL HIGH (ref 1.7–7.7)
Neutrophils Relative %: 91 %
Platelets: 159 10*3/uL (ref 150–400)
RBC: 5.03 MIL/uL (ref 4.22–5.81)
RDW: 13.8 % (ref 11.5–15.5)
WBC: 9.9 10*3/uL (ref 4.0–10.5)
nRBC: 0 % (ref 0.0–0.2)

## 2019-12-13 LAB — URINALYSIS, MICROSCOPIC (REFLEX)

## 2019-12-13 LAB — LACTIC ACID, PLASMA: Lactic Acid, Venous: 1.7 mmol/L (ref 0.5–1.9)

## 2019-12-13 LAB — SARS CORONAVIRUS 2 BY RT PCR (HOSPITAL ORDER, PERFORMED IN ~~LOC~~ HOSPITAL LAB): SARS Coronavirus 2: NEGATIVE

## 2019-12-13 MED ORDER — SODIUM CHLORIDE 0.9 % IV BOLUS (SEPSIS)
1000.0000 mL | Freq: Once | INTRAVENOUS | Status: AC
  Administered 2019-12-13: 1000 mL via INTRAVENOUS

## 2019-12-13 MED ORDER — SODIUM CHLORIDE 0.9 % IV SOLN
1.0000 g | Freq: Once | INTRAVENOUS | Status: AC
  Administered 2019-12-13: 1 g via INTRAVENOUS
  Filled 2019-12-13: qty 10

## 2019-12-13 MED ORDER — SODIUM CHLORIDE 0.9 % IV SOLN: 1000.0000 mL | INTRAVENOUS | Status: DC

## 2019-12-13 MED ORDER — CEPHALEXIN 500 MG PO CAPS: 500.0000 mg | ORAL_CAPSULE | Freq: Four times a day (QID) | ORAL | 0 refills | Status: AC

## 2019-12-13 MED ORDER — IOHEXOL 300 MG/ML  SOLN
100.0000 mL | Freq: Once | INTRAMUSCULAR | Status: AC | PRN
  Administered 2019-12-13: 100 mL via INTRAVENOUS

## 2019-12-13 NOTE — ED Notes (Signed)
Registration in room 

## 2019-12-13 NOTE — ED Triage Notes (Addendum)
Pt c/o dysuria, body aches, and chills for "a couple of weeks". He took ASA and oxycodone PTA

## 2019-12-13 NOTE — ED Provider Notes (Signed)
[facility] EMERGENCY DEPARTMENT Provider Note   CSN: [id number] Arrival date & time: 12/13/19  9833     History Chief Complaint  Patient presents with  . Dysuria    [name] is a 54 y.o. male.  HPI   Patient states last couple weeks he has had some urinary discomfort.  He has had kidney stones before so he attributed it to a kidney stone.  Pain was not severe and he was managing this at home.  Patient states however last evening he started to have body aches and chills.  He developed a fever last night.  He has had persistent urinary discomfort.  He also has pain in his lower abdomen now.  Patient did take an aspirin oxycodone this morning prior to arrival.  Denies any vomiting or diarrhea.  No cough.  No sore throat.  No rashes or swelling.  Past Medical History:  Diagnosis Date  . Arthritis   . Diabetes mellitus   . Fistula, perirectal   . GERD (gastroesophageal reflux disease)   . Hypertension   . PTSD (post-traumatic stress disorder)   . Small bowel obstruction (HCC)     There are no problems to display for this patient.   Past Surgical History:  Procedure Laterality Date  . RECTAL SURGERY         No family history on file.  Social History   Tobacco Use  . Smoking status: Current Every Day Smoker    Types: Cigars  . Smokeless tobacco: Never Used  Vaping Use  . Vaping Use: Never used  Substance Use Topics  . Alcohol use: Yes    Comment: occ  . Drug use: No    Home Medications Prior to Admission medications   Medication Sig Start Date End Date Taking? Authorizing Provider  amLODipine (NORVASC) 10 MG tablet Take 10 mg by mouth daily.    [provider]  cephALEXin (KEFLEX) 500 MG capsule Take 1 capsule (500 mg total) by mouth 4 (four) times daily for 7 days. 12/13/19 12/20/19  Linwood Dibbles, MD  clindamycin (CLEOCIN) 150 MG capsule Take 2 capsules (300 mg total) by mouth 3 (three) times daily. 01/31/13   Zadie Rhine, MD   diphenhydrAMINE (BENADRYL) 25 MG tablet Take 1 tablet (25 mg total) by mouth every 6 (six) hours as needed for itching or allergies. 01/08/14   Toy Cookey, MD  Dulaglutide (TRULICITY [place]) Inject into the skin.    [provider]  escitalopram (LEXAPRO) 10 MG tablet Take 10 mg by mouth daily.    [provider]  glipiZIDE (GLUCOTROL) 10 MG tablet Take 1 tablet (10 mg total) by mouth 2 (two) times daily before a meal. 05/30/11 05/29/12  Carleene Cooper, MD  HYDROCHLOROTHIAZIDE PO Take by mouth.    [provider]  insulin aspart protamine- aspart (NOVOLOG MIX 70/30) (70-30) 100 UNIT/ML injection Inject into the skin.    [provider]  lisinopril (PRINIVIL,ZESTRIL) 10 MG tablet Take 10 mg by mouth daily.    [provider]  losartan (COZAAR) 100 MG tablet Take 100 mg by mouth daily.    [provider]  metFORMIN (GLUCOPHAGE) 1000 MG tablet Take 1,000 mg by mouth 2 (two) times daily.    [provider]  OMEPRAZOLE PO Take by mouth.    [provider]  oxycodone (OXY-IR) 5 MG capsule Take 10 mg by mouth every 4 (four) hours as needed.    [provider]  oxyCODONE-acetaminophen (PERCOCET/ROXICET) 5-325 MG  per tablet Take 2 tablets by mouth every 4 (four) hours as needed for pain. 01/31/13   Ripley Fraise, MD  predniSONE (DELTASONE) 20 MG tablet Take 3 tablets (60 mg total) by mouth daily. 01/08/14   Ernestina Patches, MD  QUEtiapine (SEROQUEL) 25 MG tablet Take 25 mg by mouth at bedtime.    [provider]  ranitidine (ZANTAC) 300 MG tablet Take 1 tablet (300 mg total) by mouth 2 (two) times daily. 01/08/14   Ernestina Patches, MD  SUMAtriptan (IMITREX) 50 MG tablet Take 50 mg by mouth every 2 (two) hours as needed for migraine or headache. May repeat in 2 hours if headache persists or recurs.    [provider]  testosterone cypionate (DEPOTESTOTERONE CYPIONATE) 100 MG/ML injection Inject 100 mg into the  muscle every 14 (fourteen) days. For IM use only    [provider]  Zolpidem Tartrate (AMBIEN PO) Take by mouth.    [provider]    Allergies    Patient has no known allergies.  Review of Systems   Review of Systems  All other systems reviewed and are negative.   Physical Exam Updated Vital Signs BP (!) 145/97 (BP Location: Right Arm)   Pulse 92   Temp 99.1 F (37.3 C) (Oral)   Resp 16   Ht 1.753 m (5\' 9" )   Wt 89.8 kg   SpO2 97%   BMI 29.24 kg/m   Physical Exam Vitals and nursing note reviewed.  Constitutional:      Appearance: He is well-developed. He is diaphoretic. He is not toxic-appearing.  HENT:     Head: Normocephalic and atraumatic.     Right Ear: External ear normal.     Left Ear: External ear normal.  Eyes:     General: No scleral icterus.       Right eye: No discharge.        Left eye: No discharge.     Conjunctiva/sclera: Conjunctivae normal.  Neck:     Trachea: No tracheal deviation.  Cardiovascular:     Rate and Rhythm: Regular rhythm. Tachycardia present.  Pulmonary:     Effort: Pulmonary effort is normal. No respiratory distress.     Breath sounds: Normal breath sounds. No stridor. No wheezing or rales.  Abdominal:     General: Bowel sounds are normal. There is no distension.     Palpations: Abdomen is soft.     Tenderness: There is abdominal tenderness. There is no guarding or rebound.     Comments: Tenderness suprapubic region  Musculoskeletal:        General: No tenderness.     Cervical back: Neck supple.  Skin:    General: Skin is warm.     Findings: No rash.  Neurological:     Mental Status: He is alert.     Cranial Nerves: No cranial nerve deficit (no facial droop, extraocular movements intact, no slurred speech).     Sensory: No sensory deficit.     Motor: No abnormal muscle tone or seizure activity.     Coordination: Coordination normal.     ED Results / Procedures / Treatments   Labs (all labs ordered  are listed, but only abnormal results are displayed) Labs Reviewed  CBC WITH DIFFERENTIAL/PLATELET - Abnormal; Notable for the following components:      Result Value   Neutro Abs 8.9 (*)    Lymphs Abs 0.4 (*)    All other components within normal limits  BASIC METABOLIC PANEL -  Abnormal; Notable for the following components:   Sodium 131 (*)    Glucose, Bld 212 (*)    Calcium 8.8 (*)    All other components within normal limits  URINALYSIS, ROUTINE W REFLEX MICROSCOPIC - Abnormal; Notable for the following components:   Glucose, UA 100 (*)    Hgb urine dipstick TRACE (*)    Leukocytes,Ua SMALL (*)    All other components within normal limits  URINALYSIS, MICROSCOPIC (REFLEX) - Abnormal; Notable for the following components:   Bacteria, UA MANY (*)    All other components within normal limits  SARS CORONAVIRUS 2 BY RT PCR (HOSPITAL ORDER, PERFORMED IN [facility])  URINE CULTURE  CULTURE, BLOOD (ROUTINE X 2)  CULTURE, BLOOD (ROUTINE X 2)  LACTIC ACID, PLASMA    EKG None  Radiology CT ABDOMEN PELVIS W CONTRAST  Result Date: 12/13/2019 CLINICAL DATA:  Abdominal pain.  Concern for abdominal abscess. EXAM: CT ABDOMEN AND PELVIS WITH CONTRAST TECHNIQUE: Multidetector CT imaging of the abdomen and pelvis was performed using the standard protocol following bolus administration of intravenous contrast. CONTRAST:  OMNIPAQUE IOHEXOL 300 MG/ML  SOLN COMPARISON:  CT abdomen pelvis - 10/30/2011 FINDINGS: Lower chest: Limited visualization of the lower thorax demonstrates minimal dependent subpleural ground-glass atelectasis. No discrete focal airspace opacities. No pleural effusion. Normal heart size.  No pericardial effusion. Hepatobiliary: Normal hepatic contour. No discrete hepatic lesions. Normal appearance of the gallbladder given degree distention. No radiopaque gallstones. No intra or extrahepatic biliary ductal dilatation. No ascites. Pancreas: Normal appearance of the  pancreas. Spleen: Normal appearance of the spleen. Adrenals/Urinary Tract: There is symmetric enhancement and excretion of the bilateral kidneys. Note is made of an approximately 3.0 cm hypoattenuating nonenhancing partially exophytic cyst arising from the superior pole of the right kidney. Additional bilateral subcentimeter hypoattenuating lesions are too small to accurately characterize though favored to represent additional cysts. Punctate (1-2 mm) nonobstructing renal stones are suspected bilaterally (coronal images 66, 67, 68, 70 and 74, series 5), suboptimally evaluated on this post contrast examination. No urinary obstruction. There is a minimal amount of symmetric likely age and body habitus related perinephric stranding. Normal appearance of the bilateral adrenal glands. Mild apparent thickening of the urinary bladder wall, potentially accentuated due to underdistention. Stomach/Bowel: Large colonic stool burden without evidence of enteric obstruction. Normal appearance of the terminal ileum and the retrocecal appendix. No discrete areas of bowel wall thickening. No pneumoperitoneum, pneumatosis or portal venous gas. Vascular/Lymphatic: Scattered mixed calcified and noncalcified atherosclerotic plaque within normal caliber abdominal aorta, not resulting in a hemodynamically significant stenosis. The major branch vessels of the abdominal aorta appear patent on this non CTA examination. No bulky retroperitoneal, mesenteric, pelvic or inguinal lymph adenopathy. Reproductive: Prostate is borderline enlarged with mass effect on the undersurface of the urinary bladder. No free fluid within the pelvic cul-de-sac. Other: Tiny mesenteric fat containing peri umbilical hernia. Musculoskeletal: No acute or aggressive osseous abnormalities. There is partial lumbarization of the S1 vertebral body. Increased sclerosis involving the sub articular surface of the left femoral head may represent early avascular necrosis  (coronal images 63 - 67, series 5), no associated articular surface collapse or degenerative change, similar to the 2013 examination. IMPRESSION: 1. No definite explanation for patient's abdominal pain. 2. Large colonic stool burden without evidence of enteric obstruction. Normal appearance of the appendix. 3. Suspected punctate bilateral nonobstructing nephrolithiasis, suboptimally evaluated on this postcontrast examination. 4. Prostatomegaly with mass effect on the undersurface of the urinary  bladder. If not recently performed, further evaluation with DRE is advised. 5. Mild thickening the urinary bladder wall, potentially accentuated due to underdistention, though conceivably bladder outlet obstruction (in the setting of prostatomegaly) or a cystitis could have a similar appearance. 6. Avascular necrosis of the left femoral head without associated articular surface collapse or degenerative change, similar to the 2013 examination Electronically Signed   By: Simonne Come M.D.   On: 12/13/2019 11:37    Procedures Procedures (including critical care time)  Medications Ordered in ED Medications  sodium chloride 0.9 % bolus 1,000 mL (0 mLs Intravenous Stopped 12/13/19 1040)    Followed by  0.9 %  sodium chloride infusion (1,000 mLs Intravenous Not Given 12/13/19 1151)  cefTRIAXone (ROCEPHIN) 1 g in sodium chloride 0.9 % 100 mL IVPB (0 g Intravenous Stopped 12/13/19 1040)  iohexol (OMNIPAQUE) 300 MG/ML solution 100 mL (100 mLs Intravenous Contrast Given 12/13/19 1112)    ED Course  I have reviewed the triage vital signs and the nursing notes.  Pertinent labs & imaging results that were available during my care of the patient were reviewed by me and considered in my medical decision making (see chart for details).  Clinical Course as of Dec 13 1155  Sun Dec 13, 2019  1027 Lactic acid level not elevated.  CBC and electrolyte panel unremarkable.   [JK]  1100 Urinalysis does show 6-10 white blood cells  and many bacteria most likely related to UTI however considering his fever and abdominal discomfort we will proceed with CT scan to make sure he does not have diverticulitis or intra-abdominal abscess.   [JK]    Clinical Course User Index [JK] Linwood Dibbles, MD   MDM Rules/Calculators/A&P                          Patient presented to the ED for evaluation of fevers and chills.  Patient has noticed some urinary discomfort.  Patient did have a fever up to 101 initially in the ED.  Laboratory tests showed normal CBC and lactic acid level.  Electrolyte panel reassuring.  Urinalysis does show many bacteria.  CT scan was performed to evaluate the possibility of obstructed ureteral stone versus diverticulitis.  CT scan did not show any acute findings.  Bladder abnormality likely related to the cystitis infection.  I discussed the prostate abnormality with the patient.  Recommend outpatient follow-up with a primary care doctor for further evaluation.  Also reviewed the constipation hip findings with the patient.  He is aware of the hip issues that he has.  Patient is nontoxic-appearing.  He has been given dose of Rocephin.  I think he is stable for outpatient management.  Warning signs and precautions were discussed. Final Clinical Impression(s) / ED Diagnoses Final diagnoses:  Lower urinary tract infectious disease  Prostate enlargement    Rx / DC Orders ED Discharge Orders         Ordered    cephALEXin (KEFLEX) 500 MG capsule  4 times daily     Discontinue  Reprint     12/13/19 1154           Linwood Dibbles, MD 12/13/19 1157

## 2019-12-13 NOTE — ED Notes (Signed)
ED Provider at bedside. 

## 2019-12-13 NOTE — ED Notes (Signed)
Patient transported to CT 

## 2019-12-13 NOTE — Discharge Instructions (Signed)
Take over the counter stool softeners as needed.  Take the antibiotics until complete.  Follow up with your doctor for a prostate check and to make sure your infection is clearing.  Return to the ED for worsening symptoms

## 2019-12-14 LAB — URINE CULTURE: Culture: <10000 — AB

## 2019-12-18 LAB — CULTURE, BLOOD (ROUTINE X 2)
Culture: NO GROWTH
Culture: NO GROWTH
Special Requests: ADEQUATE

## 2020-03-19 ENCOUNTER — Encounter (HOSPITAL_BASED_OUTPATIENT_CLINIC_OR_DEPARTMENT_OTHER): Payer: Self-pay | Admitting: Emergency Medicine

## 2020-03-19 ENCOUNTER — Emergency Department (HOSPITAL_BASED_OUTPATIENT_CLINIC_OR_DEPARTMENT_OTHER): Payer: No Typology Code available for payment source

## 2020-03-19 ENCOUNTER — Other Ambulatory Visit: Payer: Self-pay

## 2020-03-19 ENCOUNTER — Emergency Department (HOSPITAL_BASED_OUTPATIENT_CLINIC_OR_DEPARTMENT_OTHER)
Admission: EM | Admit: 2020-03-19 | Discharge: 2020-03-19 | Disposition: A | Discharge status: Home / Self Care | Payer: No Typology Code available for payment source | Attending: Emergency Medicine | Admitting: Emergency Medicine

## 2020-03-19 DIAGNOSIS — K219 Gastro-esophageal reflux disease without esophagitis: Secondary | ICD-10-CM | POA: Insufficient documentation

## 2020-03-19 DIAGNOSIS — E119 Type 2 diabetes mellitus without complications: Secondary | ICD-10-CM | POA: Diagnosis not present

## 2020-03-19 DIAGNOSIS — Z794 Long term (current) use of insulin: Secondary | ICD-10-CM | POA: Insufficient documentation

## 2020-03-19 DIAGNOSIS — R109 Unspecified abdominal pain: Secondary | ICD-10-CM

## 2020-03-19 DIAGNOSIS — I10 Essential (primary) hypertension: Secondary | ICD-10-CM | POA: Diagnosis not present

## 2020-03-19 DIAGNOSIS — F17290 Nicotine dependence, other tobacco product, uncomplicated: Secondary | ICD-10-CM | POA: Diagnosis not present

## 2020-03-19 DIAGNOSIS — K5900 Constipation, unspecified: Secondary | ICD-10-CM | POA: Diagnosis not present

## 2020-03-19 DIAGNOSIS — Z79899 Other long term (current) drug therapy: Secondary | ICD-10-CM | POA: Diagnosis not present

## 2020-03-19 DIAGNOSIS — R1030 Lower abdominal pain, unspecified: Secondary | ICD-10-CM | POA: Diagnosis present

## 2020-03-19 LAB — CBC
HCT: 52.7 % — ABNORMAL HIGH (ref 39.0–52.0)
Hemoglobin: 18.1 g/dL — ABNORMAL HIGH (ref 13.0–17.0)
MCH: 31.8 pg (ref 26.0–34.0)
MCHC: 34.3 g/dL (ref 30.0–36.0)
MCV: 92.6 fL (ref 80.0–100.0)
Platelets: 201 10*3/uL (ref 150–400)
RBC: 5.69 MIL/uL (ref 4.22–5.81)
RDW: 13 % (ref 11.5–15.5)
WBC: 4.2 10*3/uL (ref 4.0–10.5)
nRBC: 0 % (ref 0.0–0.2)

## 2020-03-19 LAB — URINALYSIS, MICROSCOPIC (REFLEX)

## 2020-03-19 LAB — BASIC METABOLIC PANEL
Anion gap: 9 (ref 5–15)
BUN: 9 mg/dL (ref 6–20)
CO2: 23 mmol/L (ref 22–32)
Calcium: 8.7 mg/dL — ABNORMAL LOW (ref 8.9–10.3)
Chloride: 97 mmol/L — ABNORMAL LOW (ref 98–111)
Creatinine, Ser: 0.76 mg/dL (ref 0.61–1.24)
GFR calc Af Amer: >60 mL/min (ref >60)
GFR calc non Af Amer: >60 mL/min (ref >60)
Glucose, Bld: 287 mg/dL — ABNORMAL HIGH (ref 70–99)
Potassium: 4.3 mmol/L (ref 3.5–5.1)
Sodium: 129 mmol/L — ABNORMAL LOW (ref 135–145)

## 2020-03-19 LAB — URINALYSIS, ROUTINE W REFLEX MICROSCOPIC
Bilirubin Urine: NEGATIVE
Glucose, UA: >=500 mg/dL — AB
Ketones, ur: NEGATIVE mg/dL
Leukocytes,Ua: NEGATIVE
Nitrite: NEGATIVE
Protein, ur: NEGATIVE mg/dL
Specific Gravity, Urine: 1.015 (ref 1.005–1.030)
pH: 5.5 (ref 5.0–8.0)

## 2020-03-19 MED ORDER — SODIUM CHLORIDE 0.9 % IV BOLUS (SEPSIS)
500.0000 mL | Freq: Once | INTRAVENOUS | Status: AC
  Administered 2020-03-19: 500 mL via INTRAVENOUS

## 2020-03-19 MED ORDER — POLYETHYLENE GLYCOL 3350 17 G PO PACK: 17.0000 g | PACK | Freq: Every day | ORAL | 1 refills | Status: DC

## 2020-03-19 MED ORDER — SODIUM CHLORIDE 0.9 % IV SOLN
1000.0000 mL | INTRAVENOUS | Status: DC
  Administered 2020-03-19: 500 mL via INTRAVENOUS

## 2020-03-19 MED ORDER — MORPHINE SULFATE (PF) 4 MG/ML IV SOLN
4.0000 mg | Freq: Once | INTRAVENOUS | Status: AC
  Administered 2020-03-19: 4 mg via INTRAVENOUS
  Filled 2020-03-19: qty 1

## 2020-03-19 MED ORDER — IOHEXOL 300 MG/ML  SOLN
100.0000 mL | Freq: Once | INTRAMUSCULAR | Status: AC | PRN
  Administered 2020-03-19: 100 mL via INTRAVENOUS

## 2020-03-19 MED ORDER — DOCUSATE SODIUM 100 MG PO CAPS: 100.0000 mg | ORAL_CAPSULE | Freq: Two times a day (BID) | ORAL | 0 refills | Status: DC

## 2020-03-19 NOTE — ED Provider Notes (Signed)
[facility] EMERGENCY DEPARTMENT Provider Note   CSN: [id number] Arrival date & time: 03/19/20  1144     History Chief Complaint  Patient presents with  . Flank Pain    [name] is a 54 y.o. male.  HPI Patient reports he has been having right sided pain on his flank and lower abdomen for about 2 weeks.  He reports the pain is severe and fairly sharp.  He has history of kidney stones but reports this is different.  It is made worse with certain movements and position changes.  He has not been L to get comfortable.  He does take oxycodone for chronic back pain but this has not been relieved at all by his usual medications.  No specific injury.  No pain burning urgency with urination.  He does however note that in the mornings when he urinates he feels some additional discomfort in the right lower abdomen.  He denies diarrhea.  He reports he always has irregular bowel movements but is not constipated.  No change in typical pattern.  Patient reports he has neuropathy so he always has very sensitive feet with some numbness.  No change.  He denies any chest pain, shortness of breath, cough, fever.    Past Medical History:  Diagnosis Date  . Arthritis   . Diabetes mellitus   . Fistula, perirectal   . GERD (gastroesophageal reflux disease)   . Hypertension   . PTSD (post-traumatic stress disorder)   . Small bowel obstruction (HCC)     There are no problems to display for this patient.   Past Surgical History:  Procedure Laterality Date  . RECTAL SURGERY         No family history on file.  Social History   Tobacco Use  . Smoking status: Current Every Day Smoker    Types: Cigars  . Smokeless tobacco: Never Used  Vaping Use  . Vaping Use: Never used  Substance Use Topics  . Alcohol use: Yes    Comment: occ  . Drug use: No    Home Medications Prior to Admission medications   Medication Sig Start Date End Date Taking? Authorizing Provider  amLODipine  (NORVASC) 10 MG tablet Take 10 mg by mouth daily.    [provider]  clindamycin (CLEOCIN) 150 MG capsule Take 2 capsules (300 mg total) by mouth 3 (three) times daily. 01/31/13   Zadie Rhine, MD  diphenhydrAMINE (BENADRYL) 25 MG tablet Take 1 tablet (25 mg total) by mouth every 6 (six) hours as needed for itching or allergies. 01/08/14   Toy Cookey, MD  docusate sodium (COLACE) 100 MG capsule Take 1 capsule (100 mg total) by mouth every 12 (twelve) hours. 03/19/20   Arby Barrette, MD  Dulaglutide (TRULICITY [place]) Inject into the skin.    [provider]  escitalopram (LEXAPRO) 10 MG tablet Take 10 mg by mouth daily.    [provider]  glipiZIDE (GLUCOTROL) 10 MG tablet Take 1 tablet (10 mg total) by mouth 2 (two) times daily before a meal. 05/30/11 05/29/12  Carleene Cooper, MD  HYDROCHLOROTHIAZIDE PO Take by mouth.    [provider]  insulin aspart protamine- aspart (NOVOLOG MIX 70/30) (70-30) 100 UNIT/ML injection Inject into the skin.    [provider]  lisinopril (PRINIVIL,ZESTRIL) 10 MG tablet Take 10 mg by mouth daily.    [provider]  losartan (COZAAR) 100 MG tablet Take 100 mg by mouth daily.    [provider]  metFORMIN (GLUCOPHAGE) 1000 MG tablet Take 1,000 mg by mouth 2 (two) times daily.    [provider]  OMEPRAZOLE PO Take by mouth.    [provider]  oxycodone (OXY-IR) 5 MG capsule Take 10 mg by mouth every 4 (four) hours as needed.    [provider]  oxyCODONE-acetaminophen (PERCOCET/ROXICET) 5-325 MG per tablet Take 2 tablets by mouth every 4 (four) hours as needed for pain. 01/31/13   Zadie Rhine, MD  polyethylene glycol (MIRALAX / GLYCOLAX) 17 g packet Take 17 g by mouth daily. 03/19/20   Arby Barrette, MD  predniSONE (DELTASONE) 20 MG tablet Take 3 tablets (60 mg total) by mouth daily. 01/08/14   Toy Cookey, MD  QUEtiapine (SEROQUEL) 25 MG tablet Take 25 mg by mouth  at bedtime.    [provider]  ranitidine (ZANTAC) 300 MG tablet Take 1 tablet (300 mg total) by mouth 2 (two) times daily. 01/08/14   Toy Cookey, MD  SUMAtriptan (IMITREX) 50 MG tablet Take 50 mg by mouth every 2 (two) hours as needed for migraine or headache. May repeat in 2 hours if headache persists or recurs.    [provider]  testosterone cypionate (DEPOTESTOTERONE CYPIONATE) 100 MG/ML injection Inject 100 mg into the muscle every 14 (fourteen) days. For IM use only    [provider]  Zolpidem Tartrate (AMBIEN PO) Take by mouth.    [provider]    Allergies    Patient has no known allergies.  Review of Systems   Review of Systems 10 systems reviewed and negative except as per HPI Physical Exam Updated Vital Signs BP (!) 170/107 (BP Location: Left Arm)   Pulse 72   Temp 98.6 F (37 C) (Oral)   Resp 16   Ht 5\' 9"  (1.753 m)   Wt 89.8 kg   SpO2 100%   BMI 29.24 kg/m   Physical Exam Constitutional:      Appearance: He is well-developed.     Comments: Alert and nontoxic.  Clinically well in appearance  HENT:     Head: Normocephalic and atraumatic.  Eyes:     Extraocular Movements: Extraocular movements intact.  Cardiovascular:     Rate and Rhythm: Normal rate and regular rhythm.     Heart sounds: Normal heart sounds.  Pulmonary:     Effort: Pulmonary effort is normal.     Breath sounds: Normal breath sounds.  Chest:     Chest wall: Tenderness present.  Abdominal:     General: Bowel sounds are normal. There is no distension.     Palpations: Abdomen is soft.     Tenderness: There is abdominal tenderness.     Comments: Moderate tenderness right mid to lower abdomen and particularly lateral flank area.  Musculoskeletal:        General: Normal range of motion.     Cervical back: Neck supple.     Comments: Patient endorses pain to palpation of the soft tissues of the lower right back and wrapping around the flank.  No specific  soft tissue abnormality to palpation.  No rash.  No peripheral edema.  Calves are soft and pliable.  Normal hair growth on the lower legs.  Skin:    General: Skin is warm and dry.  Neurological:     General: No focal deficit present.     Mental Status: He is alert and oriented to person, place, and time.     GCS: GCS eye subscore is 4. GCS  verbal subscore is 5. GCS motor subscore is 6.     Coordination: Coordination normal.  Psychiatric:        Mood and Affect: Mood normal.     ED Results / Procedures / Treatments   Labs (all labs ordered are listed, but only abnormal results are displayed) Labs Reviewed  URINALYSIS, ROUTINE W REFLEX MICROSCOPIC - Abnormal; Notable for the following components:      Result Value   Glucose, UA >=500 (*)    Hgb urine dipstick SMALL (*)    All other components within normal limits  BASIC METABOLIC PANEL - Abnormal; Notable for the following components:   Sodium 129 (*)    Chloride 97 (*)    Glucose, Bld 287 (*)    Calcium 8.7 (*)    All other components within normal limits  CBC - Abnormal; Notable for the following components:   Hemoglobin 18.1 (*)    HCT 52.7 (*)    All other components within normal limits  URINALYSIS, MICROSCOPIC (REFLEX) - Abnormal; Notable for the following components:   Bacteria, UA FEW (*)    All other components within normal limits    EKG None  Radiology CT Abdomen Pelvis W Contrast  Result Date: 03/19/2020 CLINICAL DATA:  Right lower quadrant abdominal pain for greater than 2 weeks. EXAM: CT ABDOMEN AND PELVIS WITH CONTRAST TECHNIQUE: Multidetector CT imaging of the abdomen and pelvis was performed using the standard protocol following bolus administration of intravenous contrast. CONTRAST:  100mL OMNIPAQUE IOHEXOL 300 MG/ML  SOLN COMPARISON:  12/13/2019 CT abdomen/pelvis. FINDINGS: Lower chest: No significant pulmonary nodules or acute consolidative airspace disease. Hepatobiliary: Normal liver size. No liver mass.  Normal gallbladder with no radiopaque cholelithiasis. No biliary ductal dilatation. Pancreas: Normal, with no mass or duct dilation. Spleen: Normal size. No mass. Adrenals/Urinary Tract: Normal adrenals. No hydronephrosis. Simple 3.5 cm medial upper right renal cyst. Several subcentimeter hypodense renal cortical lesions in both kidneys are too small to characterize and require no follow-up. Normal bladder. Stomach/Bowel: Normal non-distended stomach. Normal caliber small bowel with no small bowel wall thickening. Normal appendix. Moderate diffuse colonic stool. No large bowel wall thickening, diverticulosis or significant pericolonic fat stranding. Vascular/Lymphatic: Atherosclerotic nonaneurysmal abdominal aorta. Patent portal, splenic, hepatic and renal veins. No pathologically enlarged lymph nodes in the abdomen or pelvis. Reproductive: Mild prostatomegaly. Other: No pneumoperitoneum, ascites or focal fluid collection. Musculoskeletal: No aggressive appearing focal osseous lesions. IMPRESSION: 1. No acute abnormality. No evidence of bowel obstruction or acute bowel inflammation. Normal appendix. 2. Moderate diffuse colonic stool volume, suggesting constipation. 3. Mild prostatomegaly. 4. Aortic Atherosclerosis (ICD10-I70.0). Electronically Signed   By: Delbert PhenixJason A Poff M.D.   On: 03/19/2020 15:34    Procedures Procedures (including critical care time)  Medications Ordered in ED Medications  sodium chloride 0.9 % bolus 500 mL (0 mLs Intravenous Stopped 03/19/20 1439)  morphine 4 MG/ML injection 4 mg (4 mg Intravenous Given 03/19/20 1413)  iohexol (OMNIPAQUE) 300 MG/ML solution 100 mL (100 mLs Intravenous Contrast Given 03/19/20 1503)    ED Course  I have reviewed the triage vital signs and the nursing notes.  Pertinent labs & imaging results that were available during my care of the patient were reviewed by me and considered in my medical decision making (see chart for details).    MDM  Rules/Calculators/A&P                         Patient has had right-sided  flank pain for several weeks.  No fever, no chills.  No vomiting.  CT scan does not show acute findings.  Possible constipation by CT scan.  Patient does take oxycodone regularly for chronic back pain.  He does not take stool softeners.  At this time recommendation will be for twice daily Colace and MiraLAX.  Abdomen is soft and no guarding.  He is comfortable in appearance.  We have reviewed this plan and patient is agreeable.  Return precautions reviewed.  Recommended for close follow-up. Final Clinical Impression(s) / ED Diagnoses Final diagnoses:  Right flank pain  Constipation, unspecified constipation type    Rx / DC Orders ED Discharge Orders         Ordered    polyethylene glycol (MIRALAX / GLYCOLAX) 17 g packet  Daily        03/19/20 1602    docusate sodium (COLACE) 100 MG capsule  Every 12 hours        03/19/20 1602           Arby Barrette, MD 03/20/20 1053

## 2020-03-19 NOTE — Discharge Instructions (Addendum)
1.  Your CT scan does not show any kidney stones or other immediate problems.  There is appearance of constipation.  This can cause severe pain in the abdomen and flank area.  Start taking MiraLAX daily as prescribed.  Take Colace twice daily.  Follow diet instructions for constipation. 2.  Return to emergency department you develop a fever vomiting significantly increasing pain or other concerning symptoms. 3.  Make an appointment to see your doctor for recheck within the next 2 to 4 days.

## 2020-03-19 NOTE — ED Triage Notes (Signed)
R flank pain x 2 weeks. Denies urinary symptoms.

## 2020-03-28 ENCOUNTER — Encounter (HOSPITAL_BASED_OUTPATIENT_CLINIC_OR_DEPARTMENT_OTHER): Payer: Self-pay | Admitting: *Deleted

## 2020-03-28 ENCOUNTER — Emergency Department (HOSPITAL_BASED_OUTPATIENT_CLINIC_OR_DEPARTMENT_OTHER)
Admission: EM | Admit: 2020-03-28 | Discharge: 2020-03-28 | Disposition: A | Discharge status: Home / Self Care | Payer: No Typology Code available for payment source | Attending: Emergency Medicine | Admitting: Emergency Medicine

## 2020-03-28 ENCOUNTER — Other Ambulatory Visit: Payer: Self-pay

## 2020-03-28 ENCOUNTER — Emergency Department (HOSPITAL_BASED_OUTPATIENT_CLINIC_OR_DEPARTMENT_OTHER): Payer: No Typology Code available for payment source

## 2020-03-28 DIAGNOSIS — L84 Corns and callosities: Secondary | ICD-10-CM | POA: Insufficient documentation

## 2020-03-28 DIAGNOSIS — F17290 Nicotine dependence, other tobacco product, uncomplicated: Secondary | ICD-10-CM | POA: Diagnosis not present

## 2020-03-28 DIAGNOSIS — E119 Type 2 diabetes mellitus without complications: Secondary | ICD-10-CM | POA: Insufficient documentation

## 2020-03-28 DIAGNOSIS — I10 Essential (primary) hypertension: Secondary | ICD-10-CM | POA: Insufficient documentation

## 2020-03-28 DIAGNOSIS — Z79899 Other long term (current) drug therapy: Secondary | ICD-10-CM | POA: Insufficient documentation

## 2020-03-28 DIAGNOSIS — Z7984 Long term (current) use of oral hypoglycemic drugs: Secondary | ICD-10-CM | POA: Insufficient documentation

## 2020-03-28 MED ORDER — DOXYCYCLINE HYCLATE 100 MG PO CAPS: 100.0000 mg | ORAL_CAPSULE | Freq: Two times a day (BID) | ORAL | 0 refills | Status: DC

## 2020-03-28 NOTE — ED Triage Notes (Signed)
Pt c/o right big toe wound x 2 weeks

## 2020-03-28 NOTE — ED Provider Notes (Signed)
[facility] EMERGENCY DEPARTMENT Provider Note   CSN: [id number] Arrival date & time: 03/28/20  1940     History Chief Complaint  Patient presents with  . Wound Check    [name] is a 54 y.o. male.  54 year old male with past medical history below including hypertension, type 2 diabetes mellitus, GERD who presents with right great toe wound.  Patient reports that he has had a chronic callus on his right great toe that he has seen a podiatrist for at the [place].  In the past, the podiatrist has shaved down dead skin, last was several weeks ago.  Patient followed up with his chronic pain specialist today in the clinic and she looked at his toe and was concerned about infection.  She instructed him to go to the ED for evaluation.  He has neuropathy in his feet so he denies any pain.  He has not noticed any drainage.  Denies any fevers but states that his foot felt hot.  The history is provided by the patient.  Wound Check       Past Medical History:  Diagnosis Date  . Arthritis   . Diabetes mellitus   . Fistula, perirectal   . GERD (gastroesophageal reflux disease)   . Hypertension   . PTSD (post-traumatic stress disorder)   . Small bowel obstruction (HCC)     There are no problems to display for this patient.   Past Surgical History:  Procedure Laterality Date  . RECTAL SURGERY         No family history on file.  Social History   Tobacco Use  . Smoking status: Current Every Day Smoker    Types: Cigars  . Smokeless tobacco: Never Used  Vaping Use  . Vaping Use: Never used  Substance Use Topics  . Alcohol use: Yes    Comment: occ  . Drug use: No    Home Medications Prior to Admission medications   Medication Sig Start Date End Date Taking? Authorizing Provider  amLODipine (NORVASC) 10 MG tablet Take 10 mg by mouth daily.    [provider]  clindamycin (CLEOCIN) 150 MG capsule Take 2 capsules (300 mg total) by mouth 3 (three) times  daily. 01/31/13   Zadie Rhine, MD  diphenhydrAMINE (BENADRYL) 25 MG tablet Take 1 tablet (25 mg total) by mouth every 6 (six) hours as needed for itching or allergies. 01/08/14   Toy Cookey, MD  docusate sodium (COLACE) 100 MG capsule Take 1 capsule (100 mg total) by mouth every 12 (twelve) hours. 03/19/20   Arby Barrette, MD  doxycycline (VIBRAMYCIN) 100 MG capsule Take 1 capsule (100 mg total) by mouth 2 (two) times daily. 03/28/20   Retha Bither, Ambrose Finland, MD  Dulaglutide (TRULICITY [place]) Inject into the skin.    [provider]  escitalopram (LEXAPRO) 10 MG tablet Take 10 mg by mouth daily.    [provider]  glipiZIDE (GLUCOTROL) 10 MG tablet Take 1 tablet (10 mg total) by mouth 2 (two) times daily before a meal. 05/30/11 05/29/12  Carleene Cooper, MD  HYDROCHLOROTHIAZIDE PO Take by mouth.    [provider]  insulin aspart protamine- aspart (NOVOLOG MIX 70/30) (70-30) 100 UNIT/ML injection Inject into the skin.    [provider]  lisinopril (PRINIVIL,ZESTRIL) 10 MG tablet Take 10 mg by mouth daily.    [provider]  losartan (COZAAR) 100 MG tablet Take 100 mg by mouth daily.    [provider]  metFORMIN (GLUCOPHAGE)  1000 MG tablet Take 1,000 mg by mouth 2 (two) times daily.    [provider]  OMEPRAZOLE PO Take by mouth.    [provider]  oxycodone (OXY-IR) 5 MG capsule Take 10 mg by mouth every 4 (four) hours as needed.    [provider]  oxyCODONE-acetaminophen (PERCOCET/ROXICET) 5-325 MG per tablet Take 2 tablets by mouth every 4 (four) hours as needed for pain. 01/31/13   Zadie Rhine, MD  polyethylene glycol (MIRALAX / GLYCOLAX) 17 g packet Take 17 g by mouth daily. 03/19/20   Arby Barrette, MD  predniSONE (DELTASONE) 20 MG tablet Take 3 tablets (60 mg total) by mouth daily. 01/08/14   Toy Cookey, MD  QUEtiapine (SEROQUEL) 25 MG tablet Take 25 mg by mouth at bedtime.    [provider]  ranitidine (ZANTAC) 300 MG tablet Take 1 tablet (300 mg total) by mouth 2 (two) times daily. 01/08/14   Toy Cookey, MD  SUMAtriptan (IMITREX) 50 MG tablet Take 50 mg by mouth every 2 (two) hours as needed for migraine or headache. May repeat in 2 hours if headache persists or recurs.    [provider]  testosterone cypionate (DEPOTESTOTERONE CYPIONATE) 100 MG/ML injection Inject 100 mg into the muscle every 14 (fourteen) days. For IM use only    [provider]  Zolpidem Tartrate (AMBIEN PO) Take by mouth.    [provider]    Allergies    Patient has no known allergies.  Review of Systems   Review of Systems  Constitutional: Negative for fever.  Musculoskeletal: Negative for joint swelling.  Skin: Positive for wound.    Physical Exam Updated Vital Signs BP (!) 144/96 (BP Location: Right Arm)   Pulse 84   Temp 98.3 F (36.8 C) (Oral)   Resp 14   Ht 5\' 9"  (1.753 m)   Wt 89.4 kg   SpO2 98%   BMI 29.09 kg/m   Physical Exam Vitals and nursing note reviewed.  Constitutional:      General: He is not in acute distress.    Appearance: He is well-developed.  HENT:     Head: Normocephalic and atraumatic.  Eyes:     Conjunctiva/sclera: Conjunctivae normal.  Cardiovascular:     Pulses: Normal pulses.  Musculoskeletal:        General: No tenderness.     Cervical back: Neck supple.  Skin:    General: Skin is warm and dry.     Comments: Thick callus with central darkness on medial R great toe, dry without any drainage or surrounding erythema  Neurological:     Mental Status: He is alert and oriented to person, place, and time.  Psychiatric:        Judgment: Judgment normal.     ED Results / Procedures / Treatments   Labs (all labs ordered are listed, but only abnormal results are displayed) Labs Reviewed - No data to display  EKG None  Radiology DG Toe Great Right  Result Date: 03/28/2020 CLINICAL DATA:  Wound on  medial aspect of the first digit EXAM: RIGHT GREAT TOE COMPARISON:  None. FINDINGS: There is focal soft tissue swelling along the medial aspect of the first interphalangeal joint. No erosive changes, periostitis or destructive features. No transcortical lucency or other suspicious osseous features are seen elsewhere in the first digit or included portions of the foot. Suspect a mild hallux valgus deformity with medial bunion formation as well. IMPRESSION: 1. Focal soft tissue swelling along  the medial aspect of the first interphalangeal joint correlate for site of ulceration. No clear radiographic features of osteomyelitis however if there is persisting clinical concern or clinical ambiguity, contrast enhanced MRI could be obtained which is significantly more sensitive and specific for early changes of bone infection. 2. Suspect mild hallux valgus deformity and medial bunion formation. Electronically Signed   By: Kreg Shropshire M.D.   On: 03/28/2020 23:04    Procedures Procedures (including critical care time)  Medications Ordered in ED Medications - No data to display  ED Course  I have reviewed the triage vital signs and the nursing notes.  Pertinent imaging results that were available during my care of the patient were reviewed by me and considered in my medical decision making (see chart for details).    MDM Rules/Calculators/A&P                          I did not appreciate any obvious infection on exam, XR negative for osteo changes. Will cover w/ antibiotics but my suspicion for acute infection is low. I have referred to podiatry for ongoing management. Return precautions reviewed. Final Clinical Impression(s) / ED Diagnoses Final diagnoses:  Callus of foot    Rx / DC Orders ED Discharge Orders         Ordered    doxycycline (VIBRAMYCIN) 100 MG capsule  2 times daily        03/28/20 2310           Jennier Schissler, Ambrose Finland, MD 03/28/20 2324

## 2020-05-04 ENCOUNTER — Other Ambulatory Visit (HOSPITAL_BASED_OUTPATIENT_CLINIC_OR_DEPARTMENT_OTHER): Payer: Self-pay | Admitting: Emergency Medicine

## 2020-05-04 ENCOUNTER — Encounter (HOSPITAL_BASED_OUTPATIENT_CLINIC_OR_DEPARTMENT_OTHER): Payer: Self-pay

## 2020-05-04 ENCOUNTER — Emergency Department (HOSPITAL_BASED_OUTPATIENT_CLINIC_OR_DEPARTMENT_OTHER)
Admission: EM | Admit: 2020-05-04 | Discharge: 2020-05-04 | Disposition: A | Discharge status: Home / Self Care | Payer: No Typology Code available for payment source | Attending: Emergency Medicine | Admitting: Emergency Medicine

## 2020-05-04 ENCOUNTER — Other Ambulatory Visit: Payer: Self-pay

## 2020-05-04 DIAGNOSIS — F17290 Nicotine dependence, other tobacco product, uncomplicated: Secondary | ICD-10-CM | POA: Insufficient documentation

## 2020-05-04 DIAGNOSIS — Z79899 Other long term (current) drug therapy: Secondary | ICD-10-CM | POA: Diagnosis not present

## 2020-05-04 DIAGNOSIS — I10 Essential (primary) hypertension: Secondary | ICD-10-CM | POA: Insufficient documentation

## 2020-05-04 DIAGNOSIS — R220 Localized swelling, mass and lump, head: Secondary | ICD-10-CM | POA: Diagnosis present

## 2020-05-04 DIAGNOSIS — E119 Type 2 diabetes mellitus without complications: Secondary | ICD-10-CM | POA: Insufficient documentation

## 2020-05-04 DIAGNOSIS — Z794 Long term (current) use of insulin: Secondary | ICD-10-CM | POA: Diagnosis not present

## 2020-05-04 MED ORDER — PREDNISONE 50 MG PO TABS: 50.0000 mg | ORAL_TABLET | Freq: Every day | ORAL | 0 refills | Status: DC

## 2020-05-04 MED ORDER — CEPHALEXIN 500 MG PO CAPS: 500.0000 mg | ORAL_CAPSULE | Freq: Four times a day (QID) | ORAL | 0 refills | Status: DC

## 2020-05-04 MED ORDER — LORATADINE 10 MG PO TABS: 10.0000 mg | ORAL_TABLET | Freq: Every day | ORAL | 0 refills | Status: DC

## 2020-05-04 MED FILL — CEPHALEXIN 500 MG CAPSULE: 500 | 7 days supply | Qty: 28 | Fill #0

## 2020-05-04 MED FILL — predniSONE 50 MG TABS: 50 | 5 days supply | Qty: 5 | Fill #0

## 2020-05-04 MED FILL — LORATADINE 10 MG TABS: 10 | 90 days supply | Qty: 90 | Fill #0

## 2020-05-04 NOTE — Discharge Instructions (Signed)
Take the medications as prescribed.  Follow up with your doctor to be rechecked next week.  Return for worsening symptoms

## 2020-05-04 NOTE — ED Triage Notes (Signed)
Pt arrives complaining of swelling to right eyebrow and eye. Also states swelling to top of head. Noticed swelling started on Sunday, has been increasing. Thought he may have gotten bitten by an insect on Saturday as he was sleeping outside. Sore to touch, redness and swelling noted to eye, states eye itches.

## 2020-05-04 NOTE — ED Provider Notes (Signed)
[facility] EMERGENCY DEPARTMENT Provider Note   CSN: [id number] Arrival date & time: 05/04/20  1146     History Chief Complaint  Patient presents with  . Abscess    [name] is a 54 y.o. male.  HPI   Pt presented to the ED for evaluation of facial swelling.  Pt noticed it the other night after sleeping outside.  He feels like something may have bitten him.  Since then he started having more swelling above his right eye and now it is going to his eyelid.  It was initially itch but now is sore.  No fevers or chills.  No visual symptoms.  No blisters lesions  Past Medical History:  Diagnosis Date  . Arthritis   . Diabetes mellitus   . Fistula, perirectal   . GERD (gastroesophageal reflux disease)   . Hypertension   . PTSD (post-traumatic stress disorder)   . Small bowel obstruction (HCC)     There are no problems to display for this patient.   Past Surgical History:  Procedure Laterality Date  . RECTAL SURGERY         History reviewed. No pertinent family history.  Social History   Tobacco Use  . Smoking status: Current Some Day Smoker    Types: Cigars  . Smokeless tobacco: Never Used  Vaping Use  . Vaping Use: Never used  Substance Use Topics  . Alcohol use: Yes    Comment: occ  . Drug use: No    Home Medications Prior to Admission medications   Medication Sig Start Date End Date Taking? Authorizing Provider  amLODipine (NORVASC) 10 MG tablet Take 10 mg by mouth daily.   Yes [provider]  escitalopram (LEXAPRO) 10 MG tablet Take 10 mg by mouth daily.   Yes [provider]  HYDROCHLOROTHIAZIDE PO Take by mouth.   Yes [provider]  insulin aspart protamine- aspart (NOVOLOG MIX 70/30) (70-30) 100 UNIT/ML injection Inject into the skin.   Yes [provider]  lisinopril (PRINIVIL,ZESTRIL) 10 MG tablet Take 10 mg by mouth daily.   Yes [provider]  losartan (COZAAR) 100 MG tablet Take 100 mg  by mouth daily.   Yes [provider]  OMEPRAZOLE PO Take by mouth.   Yes [provider]  oxyCODONE-acetaminophen (PERCOCET/ROXICET) 5-325 MG per tablet Take 2 tablets by mouth every 4 (four) hours as needed for pain. 01/31/13  Yes Zadie Rhine, MD  Zolpidem Tartrate (AMBIEN PO) Take by mouth.   Yes [provider]  cephALEXin (KEFLEX) 500 MG capsule Take 1 capsule (500 mg total) by mouth 4 (four) times daily for 7 days. 05/04/20 05/11/20  Linwood Dibbles, MD  diphenhydrAMINE (BENADRYL) 25 MG tablet Take 1 tablet (25 mg total) by mouth every 6 (six) hours as needed for itching or allergies. 01/08/14   Toy Cookey, MD  docusate sodium (COLACE) 100 MG capsule Take 1 capsule (100 mg total) by mouth every 12 (twelve) hours. 03/19/20   Arby Barrette, MD  Dulaglutide (TRULICITY [place]) Inject into the skin.    [provider]  glipiZIDE (GLUCOTROL) 10 MG tablet Take 1 tablet (10 mg total) by mouth 2 (two) times daily before a meal. 05/30/11 05/29/12  Carleene Cooper, MD  loratadine (CLARITIN) 10 MG tablet Take 1 tablet (10 mg total) by mouth daily. 05/04/20   Linwood Dibbles, MD  polyethylene glycol (MIRALAX / GLYCOLAX) 17 g packet Take 17 g by mouth daily. 03/19/20   Arby Barrette, MD  predniSONE (DELTASONE) 50 MG tablet Take 1 tablet (50 mg total) by mouth daily. 05/04/20   Linwood Dibbles, MD  SUMAtriptan (IMITREX) 50 MG tablet Take 50 mg by mouth every 2 (two) hours as needed for migraine or headache. May repeat in 2 hours if headache persists or recurs.    [provider]  testosterone cypionate (DEPOTESTOTERONE CYPIONATE) 100 MG/ML injection Inject 100 mg into the muscle every 14 (fourteen) days. For IM use only    [provider]  QUEtiapine (SEROQUEL) 25 MG tablet Take 25 mg by mouth at bedtime.  05/04/20  [provider]  ranitidine (ZANTAC) 300 MG tablet Take 1 tablet (300 mg total) by mouth 2 (two) times daily. 01/08/14 05/04/20  Toy Cookey, MD     Allergies    Patient has no known allergies.  Review of Systems   Review of Systems  All other systems reviewed and are negative.   Physical Exam Updated Vital Signs BP (!) 159/98 (BP Location: Right Arm)   Pulse 79   Temp 97.9 F (36.6 C) (Oral)   Resp 18   Ht 1.753 m (5\' 9" )   Wt 88.5 kg   SpO2 100%   BMI 28.80 kg/m   Physical Exam Vitals and nursing note reviewed.  Constitutional:      General: He is not in acute distress.    Appearance: He is well-developed.  HENT:     Head: Normocephalic and atraumatic.     Right Ear: External ear normal.     Left Ear: External ear normal.  Eyes:     General: No scleral icterus.       Right eye: No discharge.        Left eye: No discharge.     Conjunctiva/sclera: Conjunctivae normal.  Neck:     Trachea: No tracheal deviation.  Cardiovascular:     Rate and Rhythm: Normal rate.  Pulmonary:     Effort: Pulmonary effort is normal. No respiratory distress.     Breath sounds: No stridor.  Abdominal:     General: There is no distension.  Musculoskeletal:        General: No swelling or deformity.     Cervical back: Neck supple.  Skin:    General: Skin is warm and dry.     Findings: Rash present.     Comments: Erythematous rash right forehead and above eye, no vesicles, no fluctuance  Neurological:     Mental Status: He is alert.     Cranial Nerves: Cranial nerve deficit: no gross deficits.     ED Results / Procedures / Treatments   Labs (all labs ordered are listed, but only abnormal results are displayed) Labs Reviewed - No data to display  EKG None  Radiology No results found.  Procedures Procedures (including critical care time)  Medications Ordered in ED Medications - No data to display  ED Course  I have reviewed the triage vital signs and the nursing notes.  Pertinent labs & imaging results that were available during my care of the patient were reviewed by me and considered in my medical decision  making (see chart for details).    MDM Rules/Calculators/A&P                          ?inflammatory reaction from bite vs early cellulitis.  No vesicle to suggest to shingles.  Will dc home on steroids and abx.  Follow up with pcp Final Clinical  Impression(s) / ED Diagnoses Final diagnoses:  Facial swelling    Rx / DC Orders ED Discharge Orders         Ordered    predniSONE (DELTASONE) 50 MG tablet  Daily        05/04/20 1240    cephALEXin (KEFLEX) 500 MG capsule  4 times daily        05/04/20 1240    loratadine (CLARITIN) 10 MG tablet  Daily        05/04/20 1240           Linwood Dibbles, MD 05/04/20 1244

## 2020-06-09 ENCOUNTER — Other Ambulatory Visit: Payer: Self-pay

## 2020-06-09 ENCOUNTER — Emergency Department (HOSPITAL_BASED_OUTPATIENT_CLINIC_OR_DEPARTMENT_OTHER): Payer: No Typology Code available for payment source

## 2020-06-09 ENCOUNTER — Encounter (HOSPITAL_BASED_OUTPATIENT_CLINIC_OR_DEPARTMENT_OTHER): Payer: Self-pay | Admitting: Emergency Medicine

## 2020-06-09 ENCOUNTER — Emergency Department (HOSPITAL_BASED_OUTPATIENT_CLINIC_OR_DEPARTMENT_OTHER)
Admission: EM | Admit: 2020-06-09 | Discharge: 2020-06-09 | Disposition: A | Discharge status: Home / Self Care | Payer: No Typology Code available for payment source | Attending: Emergency Medicine | Admitting: Emergency Medicine

## 2020-06-09 DIAGNOSIS — R300 Dysuria: Secondary | ICD-10-CM | POA: Diagnosis present

## 2020-06-09 DIAGNOSIS — Z79899 Other long term (current) drug therapy: Secondary | ICD-10-CM | POA: Diagnosis not present

## 2020-06-09 DIAGNOSIS — Z7984 Long term (current) use of oral hypoglycemic drugs: Secondary | ICD-10-CM | POA: Diagnosis not present

## 2020-06-09 DIAGNOSIS — E119 Type 2 diabetes mellitus without complications: Secondary | ICD-10-CM | POA: Diagnosis not present

## 2020-06-09 DIAGNOSIS — N3000 Acute cystitis without hematuria: Secondary | ICD-10-CM | POA: Insufficient documentation

## 2020-06-09 DIAGNOSIS — Z794 Long term (current) use of insulin: Secondary | ICD-10-CM | POA: Insufficient documentation

## 2020-06-09 DIAGNOSIS — N3090 Cystitis, unspecified without hematuria: Secondary | ICD-10-CM

## 2020-06-09 DIAGNOSIS — F17290 Nicotine dependence, other tobacco product, uncomplicated: Secondary | ICD-10-CM | POA: Diagnosis not present

## 2020-06-09 DIAGNOSIS — I10 Essential (primary) hypertension: Secondary | ICD-10-CM | POA: Insufficient documentation

## 2020-06-09 LAB — URINALYSIS, MICROSCOPIC (REFLEX): WBC, UA: >50 WBC/hpf (ref 0–5)

## 2020-06-09 LAB — URINALYSIS, ROUTINE W REFLEX MICROSCOPIC
Bilirubin Urine: NEGATIVE
Glucose, UA: >=500 mg/dL — AB
Ketones, ur: NEGATIVE mg/dL
Nitrite: NEGATIVE
Protein, ur: 30 mg/dL — AB
Specific Gravity, Urine: 1.02 (ref 1.005–1.030)
pH: 8 (ref 5.0–8.0)

## 2020-06-09 LAB — COMPREHENSIVE METABOLIC PANEL
ALT: 11 U/L (ref 0–44)
AST: 17 U/L (ref 15–41)
Albumin: 4 g/dL (ref 3.5–5.0)
Alkaline Phosphatase: 49 U/L (ref 38–126)
Anion gap: 9 (ref 5–15)
BUN: 9 mg/dL (ref 6–20)
CO2: 24 mmol/L (ref 22–32)
Calcium: 9.6 mg/dL (ref 8.9–10.3)
Chloride: 99 mmol/L (ref 98–111)
Creatinine, Ser: 0.83 mg/dL (ref 0.61–1.24)
GFR, Estimated: >60 mL/min (ref >60)
Glucose, Bld: 180 mg/dL — ABNORMAL HIGH (ref 70–99)
Potassium: 3.6 mmol/L (ref 3.5–5.1)
Sodium: 132 mmol/L — ABNORMAL LOW (ref 135–145)
Total Bilirubin: 0.6 mg/dL (ref 0.3–1.2)
Total Protein: 7.8 g/dL (ref 6.5–8.1)

## 2020-06-09 LAB — CBC WITH DIFFERENTIAL/PLATELET
Abs Immature Granulocytes: 0.02 10*3/uL (ref 0.00–0.07)
Basophils Absolute: 0 10*3/uL (ref 0.0–0.1)
Basophils Relative: 1 %
Eosinophils Absolute: 0.1 10*3/uL (ref 0.0–0.5)
Eosinophils Relative: 2 %
HCT: 44.8 % (ref 39.0–52.0)
Hemoglobin: 16.4 g/dL (ref 13.0–17.0)
Immature Granulocytes: 0 %
Lymphocytes Relative: 37 %
Lymphs Abs: 2.7 10*3/uL (ref 0.7–4.0)
MCH: 31.4 pg (ref 26.0–34.0)
MCHC: 36.6 g/dL — ABNORMAL HIGH (ref 30.0–36.0)
MCV: 85.7 fL (ref 80.0–100.0)
Monocytes Absolute: 0.5 10*3/uL (ref 0.1–1.0)
Monocytes Relative: 7 %
Neutro Abs: 3.8 10*3/uL (ref 1.7–7.7)
Neutrophils Relative %: 53 %
Platelets: 215 10*3/uL (ref 150–400)
RBC: 5.23 MIL/uL (ref 4.22–5.81)
RDW: 12.7 % (ref 11.5–15.5)
WBC: 7.3 10*3/uL (ref 4.0–10.5)
nRBC: 0 % (ref 0.0–0.2)

## 2020-06-09 MED ORDER — CEPHALEXIN 250 MG PO CAPS
500.0000 mg | ORAL_CAPSULE | Freq: Once | ORAL | Status: AC
  Administered 2020-06-09: 500 mg via ORAL
  Filled 2020-06-09: qty 2

## 2020-06-09 MED ORDER — CEPHALEXIN 500 MG PO CAPS: 500.0000 mg | ORAL_CAPSULE | Freq: Two times a day (BID) | ORAL | 0 refills | Status: AC

## 2020-06-09 NOTE — ED Notes (Signed)
Bladder scan post void 63ml.

## 2020-06-09 NOTE — ED Notes (Signed)
Pt reports low abdominal pain for past 2 days, burning with urination.

## 2020-06-09 NOTE — Discharge Instructions (Addendum)
-  Your urine sample showed you have a UTI.  An antibiotic was sent to your pharmacy.  Take as prescribed.  -The CT scan also showed Stable mild sclerosis within the left femoral head suggesting chronic AVN, no femoral head collapse.  -It also showed your prostate is enlarged.   -You should follow-up with your primary care doctor to discuss all these findings.  Return to the emergency department for any new or worsening symptoms.   Thank you for allowing [place] to care for you today.

## 2020-06-09 NOTE — ED Provider Notes (Signed)
[facility] EMERGENCY DEPARTMENT Provider Note   CSN: [id number] Arrival date & time: 06/09/20  1552     History Chief Complaint  Patient presents with  . Dysuria    [name] is a 54 y.o. male with past medical history significant for arthritis, diabetes, perirectal fistula, GERD, hypertension, SBO.  HPI Patient presents to emergency department today with chief complaint of dysuria x1 day.  He states last night when he was was trying to urinate he had a pressure feeling in his bladder. Pain does not radiate. No modifying factors. He felt like it was hard to pass urine.  He noticed that there was red drops of blood in the toilet.  He states the pain has been intermittent. He rates pain 7/10 in severity. He did not take any OTC medications for his symptoms prior to arrival. He has a history of kidney stones and states this feels similar. He admits to passing stones in the past without surgical intervention. He denies any fever, chills, nausea, emesis, flank pain, penile discharge, testicular or scrotal pain or edemea.    Past Medical History:  Diagnosis Date  . Arthritis   . Diabetes mellitus   . Fistula, perirectal   . GERD (gastroesophageal reflux disease)   . Hypertension   . PTSD (post-traumatic stress disorder)   . Small bowel obstruction (HCC)     There are no problems to display for this patient.   Past Surgical History:  Procedure Laterality Date  . RECTAL SURGERY         No family history on file.  Social History   Tobacco Use  . Smoking status: Current Some Day Smoker    Types: Cigars  . Smokeless tobacco: Never Used  Vaping Use  . Vaping Use: Never used  Substance Use Topics  . Alcohol use: Yes    Comment: occ  . Drug use: No    Home Medications Prior to Admission medications   Medication Sig Start Date End Date Taking? Authorizing Provider  amLODipine (NORVASC) 10 MG tablet Take 10 mg by mouth daily.    [provider]   cephALEXin (KEFLEX) 500 MG capsule Take 1 capsule (500 mg total) by mouth 2 (two) times daily for 7 days. 06/09/20 06/16/20  Shanon Ace, PA-C  diphenhydrAMINE (BENADRYL) 25 MG tablet Take 1 tablet (25 mg total) by mouth every 6 (six) hours as needed for itching or allergies. 01/08/14   Toy Cookey, MD  docusate sodium (COLACE) 100 MG capsule Take 1 capsule (100 mg total) by mouth every 12 (twelve) hours. 03/19/20   Arby Barrette, MD  Dulaglutide (TRULICITY [place]) Inject into the skin.    [provider]  escitalopram (LEXAPRO) 10 MG tablet Take 10 mg by mouth daily.    [provider]  glipiZIDE (GLUCOTROL) 10 MG tablet Take 1 tablet (10 mg total) by mouth 2 (two) times daily before a meal. 05/30/11 05/29/12  Carleene Cooper, MD  HYDROCHLOROTHIAZIDE PO Take by mouth.    [provider]  insulin aspart protamine- aspart (NOVOLOG MIX 70/30) (70-30) 100 UNIT/ML injection Inject into the skin.    [provider]  lisinopril (PRINIVIL,ZESTRIL) 10 MG tablet Take 10 mg by mouth daily.    [provider]  loratadine (CLARITIN) 10 MG tablet Take 1 tablet (10 mg total) by mouth daily. 05/04/20   Linwood Dibbles, MD  losartan (COZAAR) 100 MG tablet Take 100 mg by mouth daily.    [provider]  OMEPRAZOLE  PO Take by mouth.    [provider]  oxyCODONE-acetaminophen (PERCOCET/ROXICET) 5-325 MG per tablet Take 2 tablets by mouth every 4 (four) hours as needed for pain. 01/31/13   Zadie Rhine, MD  polyethylene glycol (MIRALAX / GLYCOLAX) 17 g packet Take 17 g by mouth daily. 03/19/20   Arby Barrette, MD  predniSONE (DELTASONE) 50 MG tablet Take 1 tablet (50 mg total) by mouth daily. 05/04/20   Linwood Dibbles, MD  SUMAtriptan (IMITREX) 50 MG tablet Take 50 mg by mouth every 2 (two) hours as needed for migraine or headache. May repeat in 2 hours if headache persists or recurs.    [provider]  testosterone cypionate (DEPOTESTOTERONE  CYPIONATE) 100 MG/ML injection Inject 100 mg into the muscle every 14 (fourteen) days. For IM use only    [provider]  Zolpidem Tartrate (AMBIEN PO) Take by mouth.    [provider]  QUEtiapine (SEROQUEL) 25 MG tablet Take 25 mg by mouth at bedtime.  05/04/20  [provider]  ranitidine (ZANTAC) 300 MG tablet Take 1 tablet (300 mg total) by mouth 2 (two) times daily. 01/08/14 05/04/20  Toy Cookey, MD    Allergies    Patient has no known allergies.  Review of Systems   Review of Systems All other systems are reviewed and are negative for acute change except as noted in the HPI.  Physical Exam Updated Vital Signs BP 132/84   Pulse 90   Temp 98 F (36.7 C)   Resp 18   Ht 5\' 9"  (1.753 m)   Wt 89.8 kg   SpO2 99%   BMI 29.24 kg/m   Physical Exam Vitals and nursing note reviewed.  Constitutional:      General: He is not in acute distress.    Appearance: He is not ill-appearing.  HENT:     Head: Normocephalic and atraumatic.     Right Ear: Tympanic membrane and external ear normal.     Left Ear: Tympanic membrane and external ear normal.     Nose: Nose normal.     Mouth/Throat:     Mouth: Mucous membranes are moist.     Pharynx: Oropharynx is clear.  Eyes:     General: No scleral icterus.       Right eye: No discharge.        Left eye: No discharge.     Extraocular Movements: Extraocular movements intact.     Conjunctiva/sclera: Conjunctivae normal.     Pupils: Pupils are equal, round, and reactive to light.  Neck:     Vascular: No JVD.  Cardiovascular:     Rate and Rhythm: Normal rate and regular rhythm.     Pulses: Normal pulses.          Radial pulses are 2+ on the right side and 2+ on the left side.     Heart sounds: Normal heart sounds.  Pulmonary:     Comments: Lungs clear to auscultation in all fields. Symmetric chest rise. No wheezing, rales, or rhonchi. Abdominal:     Tenderness: There is no right CVA tenderness or left CVA  tenderness.     Comments: Abdomen is soft, non-distended. Tender to palpation of suprapubic region.  No rigidity, no guarding. No peritoneal signs.  Genitourinary:    Comments: Patient defers GU exam Musculoskeletal:        General: Normal range of motion.     Cervical back: Normal range of motion.  Skin:    General:  Skin is warm and dry.     Capillary Refill: Capillary refill takes less than 2 seconds.  Neurological:     Mental Status: He is oriented to person, place, and time.     GCS: GCS eye subscore is 4. GCS verbal subscore is 5. GCS motor subscore is 6.     Comments: Fluent speech, no facial droop.  Psychiatric:        Behavior: Behavior normal.     ED Results / Procedures / Treatments   Labs (all labs ordered are listed, but only abnormal results are displayed) Labs Reviewed  URINALYSIS, ROUTINE W REFLEX MICROSCOPIC - Abnormal; Notable for the following components:      Result Value   APPearance CLOUDY (*)    Glucose, UA >=500 (*)    Hgb urine dipstick MODERATE (*)    Protein, ur 30 (*)    Leukocytes,Ua LARGE (*)    All other components within normal limits  URINALYSIS, MICROSCOPIC (REFLEX) - Abnormal; Notable for the following components:   Bacteria, UA MANY (*)    Non Squamous Epithelial PRESENT (*)    All other components within normal limits  CBC WITH DIFFERENTIAL/PLATELET - Abnormal; Notable for the following components:   MCHC 36.6 (*)    All other components within normal limits  COMPREHENSIVE METABOLIC PANEL - Abnormal; Notable for the following components:   Sodium 132 (*)    Glucose, Bld 180 (*)    All other components within normal limits  URINE CULTURE    EKG None  Radiology CT Renal Stone Study  Result Date: 06/09/2020 CLINICAL DATA:  Hematuria dysuria EXAM: CT ABDOMEN AND PELVIS WITHOUT CONTRAST TECHNIQUE: Multidetector CT imaging of the abdomen and pelvis was performed following the standard protocol without IV contrast. COMPARISON:  CT  03/19/2020, 12/13/2019, 10/30/2011 FINDINGS: Lower chest: Lung bases demonstrate no acute consolidation or effusion. Hepatobiliary: No focal liver abnormality is seen. No gallstones, gallbladder wall thickening, or biliary dilatation. Pancreas: Unremarkable. No pancreatic ductal dilatation or surrounding inflammatory changes. Spleen: Normal in size without focal abnormality. Adrenals/Urinary Tract: Adrenal glands are normal. Cyst in the upper pole right kidney. Additional hypodense renal lesions, incompletely characterized without contrast but probably representing cysts. No hydronephrosis. No ureteral stone. Slightly thick-walled appearance of the urinary bladder. Stomach/Bowel: Stomach is within normal limits. Appendix appears normal. No evidence of bowel wall thickening, distention, or inflammatory changes. Vascular/Lymphatic: Mild aortic atherosclerosis without aneurysm. No suspicious nodes. Reproductive: Enlarged prostate with mass effect on the bladder Other: Negative for free air or free fluid Musculoskeletal: Stable mild sclerosis within the left femoral head suggesting chronic AVN, no femoral head collapse. IMPRESSION: 1. Negative for hydronephrosis or ureteral stone. 2. Slightly thick-walled appearance of the urinary bladder, cystitis versus chronic obstruction. 3. Enlarged prostate with mass effect on the bladder. Aortic Atherosclerosis (ICD10-I70.0). Electronically Signed   By: Jasmine Pang M.D.   On: 06/09/2020 17:33    Procedures Procedures (including critical care time)  Medications Ordered in ED Medications  cephALEXin (KEFLEX) capsule 500 mg (500 mg Oral Given 06/09/20 1917)    ED Course  I have reviewed the triage vital signs and the nursing notes.  Pertinent labs & imaging results that were available during my care of the patient were reviewed by me and considered in my medical decision making (see chart for details).    MDM Rules/Calculators/A&P  History provided by patient with additional history obtained from chart review.    54 year old male presenting with dysuria and gross hematuria.  In triage she was afebrile, normotensive, tachycardic at 105.  On my exam is well-appearing, no acute distress.  Rechecked his temperature and he is afebrile 98.3.  Tachycardia has resolved.  On exam he has suprapubic tenderness palpation.  No peritoneal signs.  No CVA tenderness.  CBC unremarkable.  CMP without significant derangement, no renal insufficiency, no transaminitis.  He does have hyperglycemia with a glucose of 180, he is a known diabetic. UA shows moderate hemoglobinuria, large leukocytes, 11-20 RBCs and over 50 WBCs with many bacteria.  Urine culture sent. CT renal is negative for kidney stone or hydronephrosis.  He does have looks to be cystitis versus chronic obstruction.  Radiologist also commented on stable mild sclerosis within the left femoral head suggesting chronic AVN, no femoral head collapse.  As well as large prostate. I discussed all results with patient.  Post void residual bladder scan was performed and shows only 95 mL.  It appears patient is able to empty his bladder.  We will treat with antibiotics for UTI.  Patient advised to follow-up with PCP regarding incidental findings.  The patient appears reasonably screened and/or stabilized for discharge and I doubt any other medical condition or other [facility] requiring further screening, evaluation, or treatment in the ED at this time prior to discharge. The patient is safe for discharge with strict return precautions discussed.   Portions of this note were generated with Scientist, clinical (histocompatibility and immunogenetics)Dragon dictation software. Dictation errors may occur despite best attempts at proofreading.    Final Clinical Impression(s) / ED Diagnoses Final diagnoses:  Cystitis    Rx / DC Orders ED Discharge Orders         Ordered    cephALEXin (KEFLEX) 500 MG capsule  2 times daily        06/09/20 1907            Shanon AceWalisiewicz, Shiane Wenberg E, PA-C 06/09/20 Christie [id number]    Pfeiffer, Marcy, MD 06/10/20 1357

## 2020-06-09 NOTE — ED Triage Notes (Signed)
Pt reports dysuria and hematuria since last night.

## 2020-06-12 LAB — URINE CULTURE: Culture: >=100000 — AB

## 2020-06-13 ENCOUNTER — Telehealth: Payer: Self-pay

## 2020-06-13 NOTE — Telephone Encounter (Signed)
Post ED Visit - Positive Culture Follow-up  Culture report reviewed by antimicrobial stewardship pharmacist: [facility] []  , Pharm.D. []  Enzo Bi, Pharm.D., BCPS AQ-ID []  , Pharm.D., BCPS []  Celedonio Miyamoto, .D., BCPS []  [place], .D., BCPS, AAHIVP []  Georgina Pillion, Pharm.D., BCPS, AAHIVP []  [address], PharmD, BCPS []  , PharmD, BCPS []  [place], PharmD, BCPS []  [address], PharmD []  , PharmD, BCPS []  Estella Husk, PharmD [facility] []  Lysle Pearl, PharmD []  , PharmD []  Phillips Climes, PharmD []  , Rph []  Agapito Games) , PharmD []  Verlan Friends, PharmD []  , PharmD []  Mervyn Gay, PharmD []  , PharmD []  Vinnie Level, PharmD []  Bufford Lope, PharmD []  , PharmD []  Len Childs, PharmD   Positive urine culture Treated with Cephalexin, organism sensitive to the same and no further patient follow-up is required at this time.  06/13/2020, 10:16 AM

## 2020-06-20 DIAGNOSIS — G43709 Chronic migraine without aura, not intractable, without status migrainosus: Secondary | ICD-10-CM | POA: Insufficient documentation

## 2020-07-10 ENCOUNTER — Encounter (HOSPITAL_BASED_OUTPATIENT_CLINIC_OR_DEPARTMENT_OTHER): Payer: Self-pay | Admitting: Emergency Medicine

## 2020-07-10 ENCOUNTER — Emergency Department (HOSPITAL_BASED_OUTPATIENT_CLINIC_OR_DEPARTMENT_OTHER)
Admission: EM | Admit: 2020-07-10 | Discharge: 2020-07-10 | Disposition: A | Discharge status: Home / Self Care | Payer: No Typology Code available for payment source | Attending: Emergency Medicine | Admitting: Emergency Medicine

## 2020-07-10 ENCOUNTER — Emergency Department (HOSPITAL_BASED_OUTPATIENT_CLINIC_OR_DEPARTMENT_OTHER): Payer: No Typology Code available for payment source

## 2020-07-10 ENCOUNTER — Other Ambulatory Visit: Payer: Self-pay

## 2020-07-10 DIAGNOSIS — L03031 Cellulitis of right toe: Secondary | ICD-10-CM | POA: Insufficient documentation

## 2020-07-10 DIAGNOSIS — Z7984 Long term (current) use of oral hypoglycemic drugs: Secondary | ICD-10-CM | POA: Insufficient documentation

## 2020-07-10 DIAGNOSIS — M79674 Pain in right toe(s): Secondary | ICD-10-CM | POA: Diagnosis present

## 2020-07-10 DIAGNOSIS — E1140 Type 2 diabetes mellitus with diabetic neuropathy, unspecified: Secondary | ICD-10-CM | POA: Diagnosis not present

## 2020-07-10 DIAGNOSIS — Z79899 Other long term (current) drug therapy: Secondary | ICD-10-CM | POA: Diagnosis not present

## 2020-07-10 DIAGNOSIS — I10 Essential (primary) hypertension: Secondary | ICD-10-CM | POA: Insufficient documentation

## 2020-07-10 DIAGNOSIS — F17290 Nicotine dependence, other tobacco product, uncomplicated: Secondary | ICD-10-CM | POA: Insufficient documentation

## 2020-07-10 LAB — CBC WITH DIFFERENTIAL/PLATELET
Abs Immature Granulocytes: 0.03 10*3/uL (ref 0.00–0.07)
Basophils Absolute: 0 10*3/uL (ref 0.0–0.1)
Basophils Relative: 1 %
Eosinophils Absolute: 0.1 10*3/uL (ref 0.0–0.5)
Eosinophils Relative: 2 %
HCT: 47.8 % (ref 39.0–52.0)
Hemoglobin: 16.7 g/dL (ref 13.0–17.0)
Immature Granulocytes: 0 %
Lymphocytes Relative: 22 %
Lymphs Abs: 1.7 10*3/uL (ref 0.7–4.0)
MCH: 32.4 pg (ref 26.0–34.0)
MCHC: 34.9 g/dL (ref 30.0–36.0)
MCV: 92.6 fL (ref 80.0–100.0)
Monocytes Absolute: 0.9 10*3/uL (ref 0.1–1.0)
Monocytes Relative: 11 %
Neutro Abs: 5 10*3/uL (ref 1.7–7.7)
Neutrophils Relative %: 64 %
Platelets: 206 10*3/uL (ref 150–400)
RBC: 5.16 MIL/uL (ref 4.22–5.81)
RDW: 15.6 % — ABNORMAL HIGH (ref 11.5–15.5)
WBC: 7.8 10*3/uL (ref 4.0–10.5)
nRBC: 0 % (ref 0.0–0.2)

## 2020-07-10 LAB — BASIC METABOLIC PANEL
Anion gap: 11 (ref 5–15)
BUN: 9 mg/dL (ref 6–20)
CO2: 23 mmol/L (ref 22–32)
Calcium: 8.4 mg/dL — ABNORMAL LOW (ref 8.9–10.3)
Chloride: 99 mmol/L (ref 98–111)
Creatinine, Ser: 0.93 mg/dL (ref 0.61–1.24)
GFR, Estimated: >60 mL/min (ref >60)
Glucose, Bld: 157 mg/dL — ABNORMAL HIGH (ref 70–99)
Potassium: 3.9 mmol/L (ref 3.5–5.1)
Sodium: 133 mmol/L — ABNORMAL LOW (ref 135–145)

## 2020-07-10 MED ORDER — CLINDAMYCIN HCL 300 MG PO CAPS: 300.0000 mg | ORAL_CAPSULE | Freq: Four times a day (QID) | ORAL | 0 refills | Status: DC

## 2020-07-10 MED ORDER — CLINDAMYCIN PHOSPHATE 600 MG/50ML IV SOLN
600.0000 mg | Freq: Once | INTRAVENOUS | Status: AC
  Administered 2020-07-10: 600 mg via INTRAVENOUS
  Filled 2020-07-10: qty 50

## 2020-07-10 NOTE — Discharge Instructions (Addendum)
Begin taking clindamycin as prescribed.  Local wound care with bacitracin and dressing changes twice daily.  Follow-up with your podiatrist on Monday, and return to the emergency department if you develop worsening swelling, redness extending into the foot or ankle, high fever, or other new and concerning symptoms.

## 2020-07-10 NOTE — ED Notes (Signed)
Pt vitals stable, IV removed, gave pt additional supplies to take care of affected right great toe (nonadherent dressing, coban, bacitracin). Wound care completed by this writer, vitals WNL. Pt verbalized understanding of f/u info, gave script. Pt left ED w/o incident.

## 2020-07-10 NOTE — ED Provider Notes (Addendum)
[facility] EMERGENCY DEPARTMENT Provider Note   CSN: [id number] Arrival date & time: 07/10/20  0158     History Chief Complaint  Patient presents with  . Groin Pain  . Toe Pain    Carlton Buskey is a 55 y.o. male.  Patient is a 55 year old male with history of diabetes, hypertension, diabetic neuropathy.  He presents today for evaluation of swelling of his right great toe.  Patient has been followed by his doctor at the [facility] for a callus to the medial aspect of the distal foot.  Just this evening, he noticed swelling and a blister.  Patient denies any pain, but states that he does not have feeling in his feet secondary to his diabetes.  He denies any fevers or chills.  He also reports pain to his right groin.  This began in the absence of any injury or trauma.  There are no bowel or bladder complaints.  The history is provided by the patient.  Groin Pain This is a new problem. The current episode started yesterday. The problem occurs constantly. The problem has been rapidly worsening. Nothing aggravates the symptoms. Nothing relieves the symptoms. He has tried nothing for the symptoms.  Toe Pain       Past Medical History:  Diagnosis Date  . Arthritis   . Diabetes mellitus   . Fistula, perirectal   . GERD (gastroesophageal reflux disease)   . Hypertension   . PTSD (post-traumatic stress disorder)   . Small bowel obstruction (HCC)     There are no problems to display for this patient.   Past Surgical History:  Procedure Laterality Date  . RECTAL SURGERY         No family history on file.  Social History   Tobacco Use  . Smoking status: Current Some Day Smoker    Types: Cigars  . Smokeless tobacco: Never Used  Vaping Use  . Vaping Use: Never used  Substance Use Topics  . Alcohol use: Yes    Comment: occ  . Drug use: No    Home Medications Prior to Admission medications   Medication Sig Start Date End Date Taking? Authorizing Provider   amLODipine (NORVASC) 10 MG tablet Take 10 mg by mouth daily.    [provider]  diphenhydrAMINE (BENADRYL) 25 MG tablet Take 1 tablet (25 mg total) by mouth every 6 (six) hours as needed for itching or allergies. 01/08/14   Toy Cookey, MD  docusate sodium (COLACE) 100 MG capsule Take 1 capsule (100 mg total) by mouth every 12 (twelve) hours. 03/19/20   Arby Barrette, MD  Dulaglutide (TRULICITY [place]) Inject into the skin.    [provider]  escitalopram (LEXAPRO) 10 MG tablet Take 10 mg by mouth daily.    [provider]  glipiZIDE (GLUCOTROL) 10 MG tablet Take 1 tablet (10 mg total) by mouth 2 (two) times daily before a meal. 05/30/11 05/29/12  Carleene Cooper, MD  HYDROCHLOROTHIAZIDE PO Take by mouth.    [provider]  insulin aspart protamine- aspart (NOVOLOG MIX 70/30) (70-30) 100 UNIT/ML injection Inject into the skin.    [provider]  lisinopril (PRINIVIL,ZESTRIL) 10 MG tablet Take 10 mg by mouth daily.    [provider]  loratadine (CLARITIN) 10 MG tablet Take 1 tablet (10 mg total) by mouth daily. 05/04/20   Linwood Dibbles, MD  losartan (COZAAR) 100 MG tablet Take 100 mg by mouth daily.    [provider]  OMEPRAZOLE PO  Take by mouth.    [provider]  oxyCODONE-acetaminophen (PERCOCET/ROXICET) 5-325 MG per tablet Take 2 tablets by mouth every 4 (four) hours as needed for pain. 01/31/13   Zadie Rhine, MD  polyethylene glycol (MIRALAX / GLYCOLAX) 17 g packet Take 17 g by mouth daily. 03/19/20   Arby Barrette, MD  predniSONE (DELTASONE) 50 MG tablet Take 1 tablet (50 mg total) by mouth daily. 05/04/20   Linwood Dibbles, MD  SUMAtriptan (IMITREX) 50 MG tablet Take 50 mg by mouth every 2 (two) hours as needed for migraine or headache. May repeat in 2 hours if headache persists or recurs.    [provider]  testosterone cypionate (DEPOTESTOTERONE CYPIONATE) 100 MG/ML injection Inject 100 mg into the muscle  every 14 (fourteen) days. For IM use only    [provider]  Zolpidem Tartrate (AMBIEN PO) Take by mouth.    [provider]  QUEtiapine (SEROQUEL) 25 MG tablet Take 25 mg by mouth at bedtime.  05/04/20  [provider]  ranitidine (ZANTAC) 300 MG tablet Take 1 tablet (300 mg total) by mouth 2 (two) times daily. 01/08/14 05/04/20  Toy Cookey, MD    Allergies    Patient has no known allergies.  Review of Systems   Review of Systems  All other systems reviewed and are negative.   Physical Exam Updated Vital Signs BP (!) 161/105 (BP Location: Right Arm)   Pulse (!) 111   Temp 98.8 F (37.1 C) (Oral)   Resp 18   Ht 5\' 9"  (1.753 m)   Wt 91.2 kg   SpO2 98%   BMI 29.68 kg/m   Physical Exam Vitals and nursing note reviewed.  Constitutional:      General: He is not in acute distress.    Appearance: He is well-developed and well-nourished. He is not diaphoretic.  HENT:     Head: Normocephalic and atraumatic.     Mouth/Throat:     Mouth: Oropharynx is clear and moist.  Cardiovascular:     Rate and Rhythm: Normal rate and regular rhythm.     Heart sounds: No murmur heard. No friction rub.  Pulmonary:     Effort: Pulmonary effort is normal. No respiratory distress.     Breath sounds: Normal breath sounds. No wheezing or rales.  Abdominal:     General: Bowel sounds are normal. There is no distension.     Palpations: Abdomen is soft.     Tenderness: There is no abdominal tenderness.  Musculoskeletal:        General: No edema. Normal range of motion.     Cervical back: Normal range of motion and neck supple.     Comments: The right great toe has an area of erythema extending to the distal foot.  It is warm to the touch.  There is a 1 cm blister that appears as though has purulent material underneath.  Skin:    General: Skin is warm and dry.  Neurological:     Mental Status: He is alert and oriented to person, place, and time.     Coordination:  Coordination normal.       ED Results / Procedures / Treatments   Labs (all labs ordered are listed, but only abnormal results are displayed) Labs Reviewed  BASIC METABOLIC PANEL  CBC WITH DIFFERENTIAL/PLATELET    EKG None  Radiology No results found.  Procedures Procedures (including critical care time)  Medications Ordered in ED Medications  clindamycin (CLEOCIN) IVPB 600 mg (  has no administration in time range)    ED Course  I have reviewed the triage vital signs and the nursing notes.  Pertinent labs & imaging results that were available during my care of the patient were reviewed by me and considered in my medical decision making (see chart for details).    MDM Rules/Calculators/A&P  Patient presenting with complaints of swelling and redness of his right great toe.  Patient has had a callus on his foot that his podiatrist has been working on.  The swelling and redness started acutely today.  Patient has no fever and no white count.  His x-ray does show findings suggestive of gas within the pustule on his foot.  My initial inclination was to admit him for IV antibiotics and further evaluation, however patient is not open to this.  He much prefers to go home and see his podiatrist on Monday.  Patient does understand this infection could potentially worsen and result in worsening soft tissue infection or osteomyelitis/amputation.  I did use an 11 blade to make a small hole in the pustule and drained out purulent material.  Patient will be discharged with clindamycin and as needed return.  Final Clinical Impression(s) / ED Diagnoses Final diagnoses:  None    Rx / DC Orders ED Discharge Orders    None       Geoffery Lyons, MD 07/10/20 1856    Geoffery Lyons, MD 07/10/20 [id number]

## 2020-07-10 NOTE — ED Triage Notes (Signed)
C/o right groin pain that started Saturday morning and right big toe pain.  Reports his physician has been working on his toe cutting down a callus.  Now has what appears to be a blister on the side.

## 2020-11-30 ENCOUNTER — Inpatient Hospital Stay
Admit: 2020-11-30 | Discharge: 2020-12-01 | Disposition: A | Discharge status: Home / Self Care | Payer: PRIVATE HEALTH INSURANCE | Attending: Emergency Medicine

## 2020-11-30 ENCOUNTER — Inpatient Hospital Stay: Admit: 2020-11-30 | Payer: PRIVATE HEALTH INSURANCE | Primary: Internal Medicine

## 2020-11-30 DIAGNOSIS — E11621 Type 2 diabetes mellitus with foot ulcer: Principal | ICD-10-CM

## 2020-11-30 LAB — CBC WITH AUTO DIFFERENTIAL
Absolute Eos #: 0.2 10*3/uL (ref 0.0–0.8)
Absolute Immature Granulocyte: 0 10*3/uL (ref 0.0–0.5)
Absolute Lymph #: 1.9 10*3/uL (ref 0.5–4.6)
Absolute Mono #: 0.4 10*3/uL (ref 0.1–1.3)
Basophils Absolute: 0.1 10*3/uL (ref 0.0–0.2)
Basophils: 1 % (ref 0.0–2.0)
Eosinophils %: 4 % (ref 0.5–7.8)
Hematocrit: 48.6 % (ref 41.1–50.3)
Hemoglobin: 17 g/dL (ref 13.6–17.2)
Immature Granulocytes: 0 % (ref 0.0–5.0)
Lymphocytes: 50 % — ABNORMAL HIGH (ref 13–44)
MCH: 32.5 PG (ref 26.1–32.9)
MCHC: 35 g/dL (ref 31.4–35.0)
MCV: 92.9 FL (ref 79.6–97.8)
MPV: 9.1 FL — ABNORMAL LOW (ref 9.4–12.3)
Monocytes: 11 % (ref 4.0–12.0)
Platelets: 195 10*3/uL (ref 150–450)
RBC: 5.23 M/uL (ref 4.23–5.6)
RDW: 13 % (ref 11.9–14.6)
Seg Neutrophils: 34 % — ABNORMAL LOW (ref 43–78)
Segs Absolute: 1.3 10*3/uL — ABNORMAL LOW (ref 1.7–8.2)
WBC: 3.9 10*3/uL — ABNORMAL LOW (ref 4.3–11.1)
nRBC: 0 10*3/uL (ref 0.0–0.2)

## 2020-11-30 NOTE — ED Triage Notes (Signed)
Pt states that he's had a wound on his right big toe for about 2 months.  Pt states that he is diabetic and normally goes to a podatrist but has not had a chance to do that recently.  Pt states that this wound is starting to leak some fluid.

## 2020-11-30 NOTE — Discharge Instructions (Signed)
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  X-rays are reassuring.  No sign of gas is within your soft tissues.  Lab work today is also reassuring.  Your blood pressure has been increasing during her stay in the emergency department, but you state that you have not taken any of your 3 high blood pressure medications today.  Please take your blood pressure medication when you leave the emergency department today.  Follow-up with your primary care provider.    I have faxed over a referral sheet [facility].  Their phone number is listed in your discharge paperwork    Return to emergency department if pain becomes worse, you notice redness that is spreading, develop fever or chills, wound becomes larger, chest pain, shortness of breath, signs and symptoms of stroke set in your discharge paperwork

## 2020-11-30 NOTE — ED Notes (Signed)
I have reviewed discharge instructions with the patient.  The patient verbalized understanding.    Patient left ED via Discharge Method: ambulatory to Home with self.    Opportunity for questions and clarification provided.       Patient given 0 scripts.         To continue your aftercare when you leave the hospital, you may receive an automated call from our care team to check in on how you are doing.  This is a free service and part of our promise to provide the best care and service to meet your aftercare needs.??? If you have questions, or wish to unsubscribe from this service please call [phone].  Thank you for Choosing our [facility] Emergency Department.        Letta Median, RN  11/30/20 2037

## 2020-11-30 NOTE — ED Provider Notes (Signed)
[facility] Emergency Department Provider Note                   PCP:                Darcel Bayley, MD               Age: 55 y.o.      Sex: male     No diagnosis found.    DISPOSITION         New Prescriptions    No medications on file       Orders Placed This Encounter   Procedures   ??? XR FOOT RIGHT (MIN 3 VIEWS)   ??? CBC with Auto Differential   ??? Comprehensive Metabolic Panel   ??? Lactic Acid        MDM  Number of Diagnoses or Management Options  Diabetic ulcer of right great toe [facility])  Diagnosis management comments: Vital signs reviewed, patient stable, NAD, afebrile, nontoxic in appearance    Patient's blood pressure in triage 153/98.  Patient states that he had not taken any of the 3 medications that he is prescribed.  States he takes them in the afternoon.    Obtain x-ray of right foot to assess for any signs of osteomyelitis, gas forming bacterial infection  Obtain CBC, CMP, lactic    States he does not need anything for pain control as he has a standing prescription for OxyContin and gabapentin.    Physical exam patient has a diabetic ulcer medial aspect of his right foot.  Central ulcer is approximately 1 cm in diameter with a 1.5 cm diameter surrounding area of dry tissue.  Tissue does not want to easily peel off.  No necrotic odor.  Plus DP and PT pulses on right side.  Clear to auscultation bilaterally, abdomen soft and nontender  Rest of patient's physical exam is benign    X-ray negative for fracture, no acute osseous abnormality.  No soft tissue emphysema.  CBC and CMP are encouraging.  Mildly elevated at 2.1; patient is nontachycardic and afebrile without a white count.    Based on history, physical exam, lab work, current imaging, I do not feel any further lab work or imaging is warranted at this time in the emergency department    I discussed physical exam findings, laboratory and/or imaging findings, treatment and follow-up with the patient and those who were present.  I answered any questions  they had.  They verbalized that they understood and were in agreement with treatment and disposition.    I discussed signs and symptoms that would warrant a prompt return to the emergency department with the patient.  I included the signs and symptoms on discharge paperwork.  Patient verbalized that they understood.    Discussed with patient that he may benefit from having his wound managed by a dedicated wound care clinic.  Patient verbalized that he was interested in this but did not know of any in [place].  He stated that he will be coming back to the [place] area on a regular basis to help his son and that if I can refer him to the wound care clinic here in [place] he would much appreciate it.  I filled out a referral form for [facility] to be faxed and gave patient contact information.    Patient discharged home in stable condition.  He is to follow-up with his primary care provider and his podiatrist.  At the time of discharge patient's blood pressure is elevated to 188/109.  Patient stated that he would take his blood pressure medication when he left.  Patient not having any chest pain, headache, dizziness, shortness of breath, back pain at the time of discharge.    I discussed this patient with my attending, Dr. Delora Fuel, who was in agreement with treatment and disposition       Amount and/or Complexity of Data Reviewed  Clinical lab tests: ordered and reviewed  Tests in the radiology section of CPT??: ordered and reviewed  Independent visualization of images, tracings, or specimens: (Independent visualization of imaging)    Risk of Complications, Morbidity, and/or Mortality  Presenting problems: moderate  Diagnostic procedures: moderate  Management options: low    Patient Progress  Patient progress: stable           [name] [name] is a 55 y.o. male who presents to the Emergency Department with chief complaint of    Chief Complaint   Patient presents with   ??? 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  Patient states his wound is managed by a podiatrist in [place] that he sees every 3 weeks.  Patient states wound is debrided every 3 weeks and often he was placed on antibiotics.  Patient states he takes Neurontin and oxycodone for pain for his right great toe.  Today pain worse while helping his son move and patient states that his toe smells different than normal and that he was informed that if his toe starts to smell he needs to go to the emergency department.  Denies fever, chills, nausea, vomiting, chest pain, shortness of breath, headache.  Patient states that he would just like his toe cleaned up and a referral to a new podiatrist.          All other systems reviewed and are negative.    Review of Systems   Constitutional: Negative for chills and fever.   Respiratory: Negative for shortness of breath.    Cardiovascular: Negative for chest pain.   Gastrointestinal: Negative for abdominal pain, diarrhea, nausea and vomiting.   Musculoskeletal:        Right toe pain   Skin: Positive for wound (Wound right great toe).   Neurological: Negative for headaches.   All other systems reviewed and are negative.      Past Medical History:   Diagnosis Date   ??? Diabetes mellitus ([place])    ??? Hypertension         History reviewed. No pertinent surgical history.     History reviewed. No pertinent family history.        Social Connections:    ??? Frequency of Communication with Friends and Family: Not on file   ??? Frequency of Social Gatherings with Friends and Family: Not on file   ??? Attends Religious Services: Not on file   ??? Active Member of Clubs or Organizations: Not on file   ??? Attends Archivist Meetings: Not on file   ??? Marital Status: Not on file        No Known Allergies     Vitals  signs and nursing note reviewed.   Patient Vitals for the past 4 hrs:   Temp Pulse Resp BP SpO2   11/30/20 1808 98.3 ??F (36.8 ??  C) 91 18 (!) 153/98 97 %          Physical Exam  Vitals and nursing note reviewed.   Constitutional:       General: He is not in acute distress.     Appearance: Normal appearance. He is normal weight. He is not ill-appearing, toxic-appearing or diaphoretic.   HENT:      Head: Normocephalic and atraumatic.      Nose: Nose normal.      Mouth/Throat:      Mouth: Mucous membranes are moist.   Eyes:      General: No scleral icterus.     Extraocular Movements: Extraocular movements intact.      Conjunctiva/sclera: Conjunctivae normal.   Cardiovascular:      Rate and Rhythm: Normal rate.      Pulses: Normal pulses.           Dorsalis pedis pulses are 2+ on the right side.        Posterior tibial pulses are 2+ on the right side.      Heart sounds: Normal heart sounds.   Pulmonary:      Effort: Pulmonary effort is normal.      Breath sounds: Normal breath sounds.   Abdominal:      General: Bowel sounds are normal.      Palpations: Abdomen is soft.      Tenderness: There is no abdominal tenderness. There is no right CVA tenderness, left CVA tenderness, guarding or rebound.   Musculoskeletal:         General: Normal range of motion.      Cervical back: Normal range of motion.      Right lower leg: No edema.      Left lower leg: No edema.        Feet:    Feet:      Right foot:      Skin integrity: Ulcer present.      Comments: No necrotic odor coming from ulcer  Skin:     General: Skin is warm and dry.      Capillary Refill: Capillary refill takes less than 2 seconds.      Findings: Lesion (1 cm in diameter diabetic ulcer right toe with Surrounding dry skin that does not want to peel away.  Approximately 1.5 cm diameter surrounding central ulcer.) present.   Neurological:      General: No focal deficit present.      Mental Status: He is alert and oriented to person, place, and time.   Psychiatric:          Mood and Affect: Mood normal.         Behavior: Behavior normal.         Thought Content: Thought content normal.         Judgment: Judgment normal.          Procedures    Labs Reviewed   CBC WITH AUTO DIFFERENTIAL   COMPREHENSIVE METABOLIC PANEL   LACTIC ACID        XR FOOT RIGHT (MIN 3 VIEWS)   Final Result      No acute osseous abnormality of the right foot.                               ED Course as of 11/30/20 2014   Wed Nov 30, 2020   1909 XR FOOT RIGHT (MIN 3 VIEWS)  FINDINGS:    ??  Diffuse soft tissue swelling about the right forefoot. No acute fracture or  dislocation is identified. No osseous erosion or focal periosteal reaction. Mild  midfoot os to arthrosis. No soft tissue emphysema raping foreign body is  identified.  ??  IMPRESSION:  ??  No acute osseous abnormality of the right foot. [JG]   1952 CBC with Auto Differential(!):    WBC 3.9(!)   RBC 5.23   Hemoglobin Quant 17.0   Hematocrit 48.6   MCV 92.9   MCH 32.5   MCHC 35.0   RDW 13.0   Platelet Count 195   MPV 9.1(!)   Nucleated Red Blood Cells 0.00   Differential Type AUTOMATED   Seg Neutrophils 34(!)   Lymphocytes 50(!)   Monocytes 11   Eosinophils % 4   Basophils 1   Immature Granulocytes 0   Segs Absolute 1.3(!)   Absolute Lymph # 1.9   Absolute Mono # 0.4   Absolute Eos # 0.2   Basophils Absolute 0.1   Absolute Immature Granulocyte 0.0 [JG]   2008 Comprehensive Metabolic Panel(!):    Sodium 140   Potassium 4.0   Chloride 104   CO2 27   Anion Gap 9   GLUCOSE, FASTING,GF 220(!)   BUN,BUNPL 9   Creatinine 0.99   GFR African American >60   GFR Non-African American >60   CALCIUM, SERUM, 540086 9.0   Bilirubin 0.4   ALT 13   AST 16   Alk Phosphatase 57   Total Protein 6.8   Albumin 3.1(!)   Globulin 3.7(!)   ALBUMIN/GLOBULIN RATIO 0.8(!) [JG]   2013 Lactic Acid(!):    Lactic Acid, Plasma 2.1(!) [JG]      ED Course User Index  [JG] Dahlia Client, PA        Voice dictation software was used during the making of this note.  This software is not  perfect and grammatical and other typographical errors may be present.  This note has not been completely proofread for errors.     Dahlia Client, PA  11/30/20 [place], [place]  12/01/20 0009

## 2020-12-01 LAB — COMPREHENSIVE METABOLIC PANEL
ALT: 13 U/L (ref 12–65)
AST: 16 U/L (ref 15–37)
Albumin/Globulin Ratio: 0.8 — ABNORMAL LOW (ref 1.2–3.5)
Albumin: 3.1 g/dL — ABNORMAL LOW (ref 3.5–5.0)
Alk Phosphatase: 57 U/L (ref 50–136)
Anion Gap: 9 mmol/L (ref 7–16)
BUN: 9 MG/DL (ref 6–23)
CO2: 27 mmol/L (ref 21–32)
Calcium: 9 MG/DL (ref 8.3–10.4)
Chloride: 104 mmol/L (ref 98–107)
Creatinine: 0.99 MG/DL (ref 0.8–1.5)
GFR African American: >60 mL/min/{1.73_m2} (ref >60)
GFR Non-African American: >60 mL/min/{1.73_m2} (ref >60)
Globulin: 3.7 g/dL — ABNORMAL HIGH (ref 2.3–3.5)
Glucose: 220 mg/dL — ABNORMAL HIGH (ref 65–100)
Potassium: 4 mmol/L (ref 3.5–5.1)
Sodium: 140 mmol/L (ref 136–145)
Total Bilirubin: 0.4 MG/DL (ref 0.2–1.1)
Total Protein: 6.8 g/dL (ref 6.3–8.2)

## 2020-12-01 LAB — LACTIC ACID: Lactic Acid, Plasma: 2.1 MMOL/L — ABNORMAL HIGH (ref 0.4–2.0)

## 2020-12-09 ENCOUNTER — Encounter: Attending: Surgery | Primary: Internal Medicine

## 2020-12-13 ENCOUNTER — Inpatient Hospital Stay: Payer: PRIVATE HEALTH INSURANCE | Attending: Surgery | Primary: Internal Medicine

## 2021-03-10 ENCOUNTER — Encounter (HOSPITAL_BASED_OUTPATIENT_CLINIC_OR_DEPARTMENT_OTHER): Payer: Self-pay | Admitting: *Deleted

## 2021-03-10 ENCOUNTER — Emergency Department (HOSPITAL_BASED_OUTPATIENT_CLINIC_OR_DEPARTMENT_OTHER)
Admission: EM | Admit: 2021-03-10 | Discharge: 2021-03-11 | Disposition: A | Discharge status: Home / Self Care | Payer: No Typology Code available for payment source | Attending: Emergency Medicine | Admitting: Emergency Medicine

## 2021-03-10 ENCOUNTER — Other Ambulatory Visit: Payer: Self-pay

## 2021-03-10 ENCOUNTER — Emergency Department (HOSPITAL_BASED_OUTPATIENT_CLINIC_OR_DEPARTMENT_OTHER): Payer: No Typology Code available for payment source

## 2021-03-10 DIAGNOSIS — F17290 Nicotine dependence, other tobacco product, uncomplicated: Secondary | ICD-10-CM | POA: Diagnosis not present

## 2021-03-10 DIAGNOSIS — Z794 Long term (current) use of insulin: Secondary | ICD-10-CM | POA: Insufficient documentation

## 2021-03-10 DIAGNOSIS — Z20822 Contact with and (suspected) exposure to covid-19: Secondary | ICD-10-CM | POA: Diagnosis not present

## 2021-03-10 DIAGNOSIS — E119 Type 2 diabetes mellitus without complications: Secondary | ICD-10-CM | POA: Insufficient documentation

## 2021-03-10 DIAGNOSIS — I10 Essential (primary) hypertension: Secondary | ICD-10-CM | POA: Diagnosis not present

## 2021-03-10 DIAGNOSIS — Z79899 Other long term (current) drug therapy: Secondary | ICD-10-CM | POA: Diagnosis not present

## 2021-03-10 DIAGNOSIS — L089 Local infection of the skin and subcutaneous tissue, unspecified: Secondary | ICD-10-CM | POA: Insufficient documentation

## 2021-03-10 DIAGNOSIS — Z7984 Long term (current) use of oral hypoglycemic drugs: Secondary | ICD-10-CM | POA: Diagnosis not present

## 2021-03-10 LAB — COMPREHENSIVE METABOLIC PANEL
ALT: 9 U/L (ref 0–44)
AST: 18 U/L (ref 15–41)
Albumin: 4.1 g/dL (ref 3.5–5.0)
Alkaline Phosphatase: 55 U/L (ref 38–126)
Anion gap: 9 (ref 5–15)
BUN: 10 mg/dL (ref 6–20)
CO2: 24 mmol/L (ref 22–32)
Calcium: 9 mg/dL (ref 8.9–10.3)
Chloride: 100 mmol/L (ref 98–111)
Creatinine, Ser: 0.93 mg/dL (ref 0.61–1.24)
GFR, Estimated: >60 mL/min (ref >60)
Glucose, Bld: 164 mg/dL — ABNORMAL HIGH (ref 70–99)
Potassium: 3.7 mmol/L (ref 3.5–5.1)
Sodium: 133 mmol/L — ABNORMAL LOW (ref 135–145)
Total Bilirubin: 0.4 mg/dL (ref 0.3–1.2)
Total Protein: 8.3 g/dL — ABNORMAL HIGH (ref 6.5–8.1)

## 2021-03-10 LAB — CBC WITH DIFFERENTIAL/PLATELET
Abs Immature Granulocytes: 0.04 10*3/uL (ref 0.00–0.07)
Basophils Absolute: 0 10*3/uL (ref 0.0–0.1)
Basophils Relative: 1 %
Eosinophils Absolute: 0 10*3/uL (ref 0.0–0.5)
Eosinophils Relative: 0 %
HCT: 50.1 % (ref 39.0–52.0)
Hemoglobin: 17.9 g/dL — ABNORMAL HIGH (ref 13.0–17.0)
Immature Granulocytes: 1 %
Lymphocytes Relative: 19 %
Lymphs Abs: 1.1 10*3/uL (ref 0.7–4.0)
MCH: 31.6 pg (ref 26.0–34.0)
MCHC: 35.7 g/dL (ref 30.0–36.0)
MCV: 88.5 fL (ref 80.0–100.0)
Monocytes Absolute: 0.5 10*3/uL (ref 0.1–1.0)
Monocytes Relative: 9 %
Neutro Abs: 4.2 10*3/uL (ref 1.7–7.7)
Neutrophils Relative %: 70 %
Platelets: 174 10*3/uL (ref 150–400)
RBC: 5.66 MIL/uL (ref 4.22–5.81)
RDW: 13 % (ref 11.5–15.5)
WBC: 6 10*3/uL (ref 4.0–10.5)
nRBC: 0 % (ref 0.0–0.2)

## 2021-03-10 LAB — LACTIC ACID, PLASMA: Lactic Acid, Venous: 1.8 mmol/L (ref 0.5–1.9)

## 2021-03-10 LAB — RESP PANEL BY RT-PCR (FLU A&B, COVID) ARPGX2
Influenza A by PCR: NEGATIVE
Influenza B by PCR: NEGATIVE
SARS Coronavirus 2 by RT PCR: NEGATIVE

## 2021-03-10 MED ORDER — SODIUM CHLORIDE 0.9 % IV BOLUS
1000.0000 mL | Freq: Once | INTRAVENOUS | Status: AC
  Administered 2021-03-10: 1000 mL via INTRAVENOUS

## 2021-03-10 MED ORDER — ONDANSETRON HCL 4 MG/2ML IJ SOLN
4.0000 mg | Freq: Once | INTRAMUSCULAR | Status: AC
  Administered 2021-03-10: 4 mg via INTRAVENOUS
  Filled 2021-03-10: qty 2

## 2021-03-10 MED ORDER — ACETAMINOPHEN 325 MG PO TABS
650.0000 mg | ORAL_TABLET | Freq: Once | ORAL | Status: AC
  Administered 2021-03-10: 650 mg via ORAL
  Filled 2021-03-10: qty 2

## 2021-03-10 NOTE — ED Triage Notes (Addendum)
C/o infection to right foot , fever , body aches x 2 days, pt started vomiting in triage  became diaphoretic , pt taken to room 7

## 2021-03-10 NOTE — ED Notes (Addendum)
Pt has been given graham crackers and a Ginger Ale with ice to drink.

## 2021-03-10 NOTE — ED Provider Notes (Signed)
[facility] EMERGENCY DEPARTMENT Provider Note   CSN: [id number] Arrival date & time: 03/10/21  1923     History Chief Complaint  Patient presents with   Wound Infection    [name] is a 55 y.o. male with a past medical history of arthritis, diabetes, GERD, hypertension presenting to the ED with a chief complaint of fever, body aches.  Patient sees podiatry every 3 weeks and "they shaved off part of my left big toe."  States that he had this procedure done yesterday.  30 minutes after he got home started experiencing chills and fatigue.  In the past the podiatrist has placed him on antibiotics after this procedure but he has not been on antibiotics for several months.  He is concerned that it may be infected.  While in the waiting room patient had an episode of emesis.  Denies any persistent nausea, denies any abdominal pain, chest pain, cough, shortness of breath or diarrhea.  No sick contacts with similar symptoms.  Did not check his temperature at home.  Has not take any medications for pain  HPI     Past Medical History:  Diagnosis Date   Arthritis    Diabetes mellitus    Fistula, perirectal    GERD (gastroesophageal reflux disease)    Hypertension    PTSD (post-traumatic stress disorder)    Small bowel obstruction (HCC)     There are no problems to display for this patient.   Past Surgical History:  Procedure Laterality Date   RECTAL SURGERY         No family history on file.  Social History   Tobacco Use   Smoking status: Some Days    Types: Cigars   Smokeless tobacco: Never  Vaping Use   Vaping Use: Never used  Substance Use Topics   Alcohol use: Yes    Comment: occ   Drug use: No    Home Medications Prior to Admission medications   Medication Sig Start Date End Date Taking? Authorizing Provider  amoxicillin-clavulanate (AUGMENTIN) 875-125 MG tablet Take 1 tablet by mouth every 12 (twelve) hours. 03/11/21  Yes Britlee Skolnik, PA-C   doxycycline (VIBRAMYCIN) 100 MG capsule Take 1 capsule (100 mg total) by mouth 2 (two) times daily for 7 days. 03/11/21 03/18/21 Yes Breannah Kratt, PA-C  amLODipine (NORVASC) 10 MG tablet Take 10 mg by mouth daily.    [provider]  cephALEXin (KEFLEX) 500 MG capsule TAKE 1 CAPSULE (500 MG TOTAL) BY MOUTH 4 (FOUR) TIMES DAILY FOR 7 DAYS. 05/04/20 05/04/21  Linwood Dibbles, MD  clindamycin (CLEOCIN) 300 MG capsule Take 1 capsule (300 mg total) by mouth 4 (four) times daily. X 7 days 07/10/20   Geoffery Lyons, MD  diphenhydrAMINE (BENADRYL) 25 MG tablet Take 1 tablet (25 mg total) by mouth every 6 (six) hours as needed for itching or allergies. 01/08/14   Toy Cookey, MD  docusate sodium (COLACE) 100 MG capsule Take 1 capsule (100 mg total) by mouth every 12 (twelve) hours. 03/19/20   Arby Barrette, MD  Dulaglutide (TRULICITY [place]) Inject into the skin.    [provider]  escitalopram (LEXAPRO) 10 MG tablet Take 10 mg by mouth daily.    [provider]  glipiZIDE (GLUCOTROL) 10 MG tablet Take 1 tablet (10 mg total) by mouth 2 (two) times daily before a meal. 05/30/11 05/29/12  Carleene Cooper, MD  HYDROCHLOROTHIAZIDE PO Take by mouth.    [provider]  insulin aspart protamine- aspart (  NOVOLOG MIX 70/30) (70-30) 100 UNIT/ML injection Inject into the skin.    [provider]  lisinopril (PRINIVIL,ZESTRIL) 10 MG tablet Take 10 mg by mouth daily.    [provider]  loratadine (CLARITIN) 10 MG tablet TAKE 1 TABLET BY MOUTH ONCE DAILY 05/04/20 05/04/21  Linwood Dibbles, MD  losartan (COZAAR) 100 MG tablet Take 100 mg by mouth daily.    [provider]  OMEPRAZOLE PO Take by mouth.    [provider]  oxyCODONE-acetaminophen (PERCOCET/ROXICET) 5-325 MG per tablet Take 2 tablets by mouth every 4 (four) hours as needed for pain. 01/31/13   Zadie Rhine, MD  polyethylene glycol (MIRALAX / GLYCOLAX) 17 g packet Take 17 g by mouth daily. 03/19/20    Arby Barrette, MD  predniSONE (DELTASONE) 50 MG tablet TAKE 1 TABLET (50 MG TOTAL) BY MOUTH DAILY. 05/04/20 05/04/21  Linwood Dibbles, MD  SUMAtriptan (IMITREX) 50 MG tablet Take 50 mg by mouth every 2 (two) hours as needed for migraine or headache. May repeat in 2 hours if headache persists or recurs.    [provider]  testosterone cypionate (DEPOTESTOTERONE CYPIONATE) 100 MG/ML injection Inject 100 mg into the muscle every 14 (fourteen) days. For IM use only    [provider]  Zolpidem Tartrate (AMBIEN PO) Take by mouth.    [provider]  QUEtiapine (SEROQUEL) 25 MG tablet Take 25 mg by mouth at bedtime.  05/04/20  [provider]  ranitidine (ZANTAC) 300 MG tablet Take 1 tablet (300 mg total) by mouth 2 (two) times daily. 01/08/14 05/04/20  Toy Cookey, MD    Allergies    Patient has no known allergies.  Review of Systems   Review of Systems  Constitutional:  Positive for fever. Negative for appetite change and chills.  HENT:  Negative for ear pain, rhinorrhea, sneezing and sore throat.   Eyes:  Negative for photophobia and visual disturbance.  Respiratory:  Negative for cough, chest tightness, shortness of breath and wheezing.   Cardiovascular:  Negative for chest pain and palpitations.  Gastrointestinal:  Negative for abdominal pain, blood in stool, constipation, diarrhea, nausea and vomiting.  Genitourinary:  Negative for dysuria, hematuria and urgency.  Musculoskeletal:  Negative for myalgias.  Skin:  Positive for wound. Negative for rash.  Neurological:  Negative for dizziness, weakness and light-headedness.   Physical Exam Updated Vital Signs BP (!) 133/94   Pulse 90   Temp (!) 100.7 F (38.2 C) (Oral)   Resp 17   Ht 5\' 9"  (1.753 m)   Wt 86.2 kg   SpO2 100%   BMI 28.06 kg/m   Physical Exam Vitals and nursing note reviewed.  Constitutional:      General: He is not in acute distress.    Appearance: He is well-developed.  HENT:      Head: Normocephalic and atraumatic.     Nose: Nose normal.  Eyes:     General: No scleral icterus.       Right eye: No discharge.        Left eye: No discharge.     Conjunctiva/sclera: Conjunctivae normal.  Neck:     Comments: No meningeal signs. Cardiovascular:     Rate and Rhythm: Normal rate and regular rhythm.     Heart sounds: Normal heart sounds. No murmur heard.   No friction rub. No gallop.  Pulmonary:     Effort: Pulmonary effort is normal. No respiratory distress.     Breath sounds: Normal breath sounds.  Abdominal:     General: Bowel sounds are normal. There is no distension.     Palpations: Abdomen is soft.     Tenderness: There is no abdominal tenderness. There is no guarding.     Comments: Soft, nontender, nondistended.  Musculoskeletal:        General: Normal range of motion.     Cervical back: Normal range of motion and neck supple.  Skin:    General: Skin is warm and dry.     Findings: Wound present. No rash.     Comments: Wound noted on the right great toe medially.  No erythema or tenderness.  2+ DP pulse palpated bilaterally.  No purulent drainage noted.  Neurological:     Mental Status: He is alert.     Motor: No abnormal muscle tone.     Coordination: Coordination normal.     ED Results / Procedures / Treatments   Labs (all labs ordered are listed, but only abnormal results are displayed) Labs Reviewed  COMPREHENSIVE METABOLIC PANEL - Abnormal; Notable for the following components:      Result Value   Sodium 133 (*)    Glucose, Bld 164 (*)    Total Protein 8.3 (*)    All other components within normal limits  CBC WITH DIFFERENTIAL/PLATELET - Abnormal; Notable for the following components:   Hemoglobin 17.9 (*)    All other components within normal limits  RESP PANEL BY RT-PCR (FLU A&B, COVID) ARPGX2  CULTURE, BLOOD (ROUTINE X 2)  CULTURE, BLOOD (ROUTINE X 2)  LACTIC ACID, PLASMA  URINALYSIS, ROUTINE W REFLEX MICROSCOPIC     EKG None  Radiology DG Chest 2 View  Result Date: 03/10/2021 CLINICAL DATA:  Sepsis EXAM: CHEST - 2 VIEW COMPARISON:  10/29/2011 FINDINGS: The heart size and mediastinal contours are within normal limits. Both lungs are clear. The visualized skeletal structures are unremarkable. IMPRESSION: No active cardiopulmonary disease. Electronically Signed   By: Sharlet Salina M.D.   On: 03/10/2021 20:51    Procedures Procedures   Medications Ordered in ED Medications  acetaminophen (TYLENOL) tablet 650 mg (650 mg Oral Given 03/10/21 2015)  ondansetron (ZOFRAN) injection 4 mg (4 mg Intravenous Given 03/10/21 1957)  sodium chloride 0.9 % bolus 1,000 mL ( Intravenous Infusion Verify 03/10/21 2316)    ED Course  I have reviewed the triage vital signs and the nursing notes.  Pertinent labs & imaging results that were available during my care of the patient were reviewed by me and considered in my medical decision making (see chart for details).  Clinical Course as of 03/11/21 0001  Fri Mar 10, 2021  2236 WBC: 6.0 [HK]  2236 Lactic Acid, Venous: 1.8 [HK]  2236 Creatinine: 0.93 [HK]  2320 SARS Coronavirus 2 by RT PCR: NEGATIVE [HK]    Clinical Course User Index [HK] Dietrich Pates, PA-C   MDM Rules/Calculators/A&P                           Keenon Leitzel was evaluated in Emergency Department on 03/11/21 for the symptoms described in the history of present illness. He/she was evaluated in the context of the global COVID-19 pandemic, which necessitated consideration that the patient might be at risk for infection with the SARS-CoV-2 virus that causes COVID-19. Institutional protocols and algorithms that pertain to the evaluation of patients at risk for COVID-19 are in a state of rapid change based on information released by regulatory bodies including the  CDC and federal and [employer] organizations. These policies and algorithms were followed during the patient's care in the ED.  55 year old male with  past medical history of diabetes presenting to the ED for fever.  He had a procedure done by his podiatrist yesterday where they "shaved off part of my toe which she does every 3 weeks."  Started experiencing chills and fatigue 30 minutes after being home from his procedure yesterday.  He is concerned that it may be infected.  He was not placed on antibiotics by his podiatrist although in the past he has been.  He has not been on antibiotics for several months.  He was febrile and tachycardic on arrival.  I feel that his tachycardia is related to his fever.  He was given antipyretic with improvement in fever and tachycardia.  Oxygen saturations are 100% on room air.  Lungs are clear.  Abdomen is soft.  He denies any ongoing nausea or vomiting.  Physical exam findings of the right great toe noted above without obvious signs of infection.  Chest x-ray is unremarkable.  CMP and CBC are unremarkable.  Will obtain urinalysis, COVID testing and reassess.  His lactic acid level is normal here.   COVID and influenza A/B test is negative.  Urinalysis is pending.  If this is negative I feel that he will benefit from antibiotic therapy for possible wound infection of his great toe.  It would be unusual for this to happen so quickly after getting the procedure done yesterday.  Could be a viral cause of his symptoms.  His abdomen is soft here.  He has no meningeal signs.  He has no pneumonia seen on imaging.  He is denying any respiratory symptoms.  He remains hemodynamically stable here.  I feel that he will be appropriate for outpatient follow-up. Care handed off to oncoming provider pending recheck and urinalysis and xray of foot. Blood cultures have been obtained.    Portions of this note were generated with Scientist, clinical (histocompatibility and immunogenetics)Dragon dictation software. Dictation errors may occur despite best attempts at proofreading.  Final Clinical Impression(s) / ED Diagnoses Final diagnoses:  Wound infection    Rx / DC Orders ED Discharge  Orders          Ordered    doxycycline (VIBRAMYCIN) 100 MG capsule  2 times daily        03/11/21 0001    amoxicillin-clavulanate (AUGMENTIN) 875-125 MG tablet  Every 12 hours        03/11/21 0001             Dietrich PatesKhatri, Solara Goodchild, PA-C 03/11/21 0001    Rancour, Jeannett SeniorStephen, MD 03/11/21 0206

## 2021-03-10 NOTE — Discharge Instructions (Addendum)
Take the antibiotics as directed. If you want to see a different podiatrist I referred you to the 1 below. Tylenol as needed for pain or fevers. Return to the ER if you start to experience worsening pain, swelling, redness, drainage from the area, chest pain or persistent vomiting.

## 2021-03-11 ENCOUNTER — Emergency Department (HOSPITAL_BASED_OUTPATIENT_CLINIC_OR_DEPARTMENT_OTHER): Payer: No Typology Code available for payment source

## 2021-03-11 LAB — URINALYSIS, ROUTINE W REFLEX MICROSCOPIC
Bilirubin Urine: NEGATIVE
Glucose, UA: NEGATIVE mg/dL
Hgb urine dipstick: NEGATIVE
Ketones, ur: NEGATIVE mg/dL
Nitrite: NEGATIVE
Protein, ur: 100 mg/dL — AB
Specific Gravity, Urine: 1.02 (ref 1.005–1.030)
pH: 6 (ref 5.0–8.0)

## 2021-03-11 LAB — URINALYSIS, MICROSCOPIC (REFLEX)

## 2021-03-11 MED ORDER — DOXYCYCLINE HYCLATE 100 MG PO CAPS: 100.0000 mg | ORAL_CAPSULE | Freq: Two times a day (BID) | ORAL | 0 refills | Status: AC

## 2021-03-11 MED ORDER — AMOXICILLIN-POT CLAVULANATE 875-125 MG PO TABS: 1.0000 | ORAL_TABLET | Freq: Two times a day (BID) | ORAL | 0 refills | Status: DC

## 2021-03-11 MED ORDER — AMOXICILLIN-POT CLAVULANATE 875-125 MG PO TABS
1.0000 | ORAL_TABLET | Freq: Once | ORAL | Status: AC
  Administered 2021-03-11: 1 via ORAL
  Filled 2021-03-11: qty 1

## 2021-03-11 MED ORDER — DOXYCYCLINE HYCLATE 100 MG PO TABS
100.0000 mg | ORAL_TABLET | Freq: Once | ORAL | Status: AC
  Administered 2021-03-11: 100 mg via ORAL
  Filled 2021-03-11: qty 1

## 2021-03-11 NOTE — ED Provider Notes (Signed)
Care assumed from [facility].  Patient with a history of diabetes, GERD, hypertension here with fever and body aches.  Has wound to his right foot that is being followed by his podiatrist.  Came in with fever, chills, and tachycardia with nausea and vomiting.  Awaiting urinalysis and x-ray of the foot  Urinalysis is negative.  Chest x-ray is negative, COVID testing is negative. X-ray foot shows no evidence of osteomyelitis or soft tissue gas.  Patient appears well.  He does not appear to be toxic or septic.  Vitals have normalized.  He is tolerating p.o. Blood cultures are pending.  Discussed starting antibiotics for presumed diabetic foot infection.  Follow-up with his podiatrist.  Return precautions discussed.   Glynn Octave, MD 03/11/21 [phone]

## 2021-03-15 LAB — CULTURE, BLOOD (ROUTINE X 2)
Culture: NO GROWTH
Culture: NO GROWTH
Special Requests: ADEQUATE
Special Requests: ADEQUATE

## 2021-06-23 ENCOUNTER — Other Ambulatory Visit: Payer: Self-pay

## 2021-06-23 ENCOUNTER — Encounter (INDEPENDENT_AMBULATORY_CARE_PROVIDER_SITE_OTHER): Payer: Self-pay | Admitting: Ophthalmology

## 2021-06-23 ENCOUNTER — Ambulatory Visit (INDEPENDENT_AMBULATORY_CARE_PROVIDER_SITE_OTHER): Payer: No Typology Code available for payment source | Admitting: Ophthalmology

## 2021-06-23 DIAGNOSIS — H3582 Retinal ischemia: Secondary | ICD-10-CM

## 2021-06-23 DIAGNOSIS — E113413 Type 2 diabetes mellitus with severe nonproliferative diabetic retinopathy with macular edema, bilateral: Secondary | ICD-10-CM | POA: Diagnosis not present

## 2021-06-23 DIAGNOSIS — I10 Essential (primary) hypertension: Secondary | ICD-10-CM | POA: Diagnosis not present

## 2021-06-23 DIAGNOSIS — H25813 Combined forms of age-related cataract, bilateral: Secondary | ICD-10-CM

## 2021-06-23 DIAGNOSIS — H4311 Vitreous hemorrhage, right eye: Secondary | ICD-10-CM

## 2021-06-23 DIAGNOSIS — H35033 Hypertensive retinopathy, bilateral: Secondary | ICD-10-CM | POA: Diagnosis not present

## 2021-06-23 NOTE — Progress Notes (Addendum)
[facility] Note  06/23/2021     CHIEF COMPLAINT Patient presents for Retina Evaluation   HISTORY OF PRESENT ILLNESS: [name] is a 55 y.o. male who presents to the clinic today for:   HPI     Retina Evaluation   In both eyes.  This started 2 months ago.  Duration of 2 months.  Associated Symptoms Flashes and Floaters.  I, the attending physician,  performed the HPI with the patient and updated documentation appropriately.        Comments   Patient here for Ret[facility] Evaluation. [facility] Patient states vision is ok this am. Last couple of months noticed light flashes and sees spots. No eye pain. Doctor saw bleeding OU.       Last edited by Bernarda Caffey, MD on 06/23/2021 11:56 AM.    Pt is here on the referral of the [facility] for retinal hemorrhages, pt went yesterday for his routine eye exam bc he has been waking up with a "bright light" in his vision, pt states he was told he has bleeding in his eyes if left untreated, could become a detachment, pt states his A1c has come down from 11 to 6, pt also endorses being hypertensive  Referring physician: Clinic, [facility] [place],  [place] [address]  HISTORICAL INFORMATION:   Selected notes from the MEDICAL RECORD NUMBER Referred by [facility] for retinal hemes LEE:  Ocular Hx- PMH-    CURRENT MEDICATIONS: No current outpatient medications on file. (Ophthalmic Drugs)   No current facility-administered medications for this visit. (Ophthalmic Drugs)   Current Outpatient Medications (Other)  Medication Sig   diphenhydrAMINE (BENADRYL) 25 MG tablet Take 1 tablet (25 mg total) by mouth every 6 (six) hours as needed for itching or allergies.   docusate sodium (COLACE) 100 MG capsule Take 1 capsule (100 mg total) by mouth every 12 (twelve) hours.   Dulaglutide (TRULICITY [place]) Inject into the skin.   escitalopram (LEXAPRO) 10 MG tablet Take 10 mg by mouth daily.    HYDROCHLOROTHIAZIDE PO Take by mouth.   lisinopril (PRINIVIL,ZESTRIL) 10 MG tablet Take 10 mg by mouth daily.   OMEPRAZOLE PO Take by mouth.   oxyCODONE-acetaminophen (PERCOCET/ROXICET) 5-325 MG per tablet Take 2 tablets by mouth every 4 (four) hours as needed for pain.   polyethylene glycol (MIRALAX / GLYCOLAX) 17 g packet Take 17 g by mouth daily.   SUMAtriptan (IMITREX) 50 MG tablet Take 50 mg by mouth every 2 (two) hours as needed for migraine or headache. May repeat in 2 hours if headache persists or recurs.   testosterone cypionate (DEPOTESTOTERONE CYPIONATE) 100 MG/ML injection Inject 100 mg into the muscle every 14 (fourteen) days. For IM use only   Zolpidem Tartrate (AMBIEN PO) Take by mouth.   amLODipine (NORVASC) 10 MG tablet Take 10 mg by mouth daily. (Patient not taking: Reported on 06/23/2021)   amoxicillin-clavulanate (AUGMENTIN) 875-125 MG tablet Take 1 tablet by mouth every 12 (twelve) hours. (Patient not taking: Reported on 06/23/2021)   clindamycin (CLEOCIN) 300 MG capsule Take 1 capsule (300 mg total) by mouth 4 (four) times daily. X 7 days (Patient not taking: Reported on 06/23/2021)   glipiZIDE (GLUCOTROL) 10 MG tablet Take 1 tablet (10 mg total) by mouth 2 (two) times daily before a meal.   insulin aspart protamine- aspart (NOVOLOG MIX 70/30) (70-30) 100 UNIT/ML injection Inject into the skin. (Patient not taking: Reported on 06/23/2021)   loratadine (  CLARITIN) 10 MG tablet TAKE 1 TABLET BY MOUTH ONCE DAILY   losartan (COZAAR) 100 MG tablet Take 100 mg by mouth daily. (Patient not taking: Reported on 06/23/2021)   No current facility-administered medications for this visit. (Other)   REVIEW OF SYSTEMS: ROS   Positive for: Eyes Last edited by [name], COA on 06/23/2021 10:07 AM.     ALLERGIES No Known Allergies  PAST MEDICAL HISTORY Past Medical History:  Diagnosis Date   Arthritis    Diabetes mellitus    Fistula, perirectal    GERD (gastroesophageal  reflux disease)    Hypertension    PTSD (post-traumatic stress disorder)    Small bowel obstruction ([place])    Past Surgical History:  Procedure Laterality Date   RECTAL SURGERY      FAMILY HISTORY History reviewed. No pertinent family history.  SOCIAL HISTORY Social History   Tobacco Use   Smoking status: Some Days    Types: Cigars   Smokeless tobacco: Never  Vaping Use   Vaping Use: Never used  Substance Use Topics   Alcohol use: Yes    Comment: occ   Drug use: No       OPHTHALMIC EXAM:  Base Eye Exam     Visual Acuity (Snellen - Linear)       Right Left   Dist cc 20/70 +2 20/20 -2   Dist ph cc 20/50     Correction: Glasses         Tonometry (Tonopen, 10:03 AM)       Right Left   Pressure 19 12         Pupils       Dark Light Shape React APD   Right 3 2 Round Brisk None   Left 3 2 Round Brisk None         Visual Fields (Counting fingers)       Left Right    Full Full         Extraocular Movement       Right Left    Full, Ortho Full, Ortho         Neuro/Psych     Oriented x3: Yes   Mood/Affect: Normal         Dilation     Both eyes: 1.0% Mydriacyl, 2.5% Phenylephrine @ 10:02 AM           Slit Lamp and Fundus Exam     Slit Lamp Exam       Right Left   Lids/Lashes Dermatochalasis - upper lid Dermatochalasis - upper lid   Conjunctiva/Sclera mild melanosis mild melanosis, nasal and temporal pinguecula   Cornea arcus, trace PEE, mild tear film debris arcus, trace PEE, mild tear film debris   Anterior Chamber Deep and quiet Deep and quiet   Iris Round and dilated, No NVI Round and dilated, No NVI   Lens 2-3+ Nuclear sclerosis, 2+ Cortical cataract 2-3+ Nuclear sclerosis, 2-3+ Cortical cataract   Anterior Vitreous Vitreous syneresis, mild blood stained vitreous condensations Vitreous syneresis         Fundus Exam       Right Left   Disc Pink and Sharp, no NVD Pink and Sharp, no NVD   C/D Ratio 0.1 0.2   Macula  Blunted foveal reflex, scattered MA/DBH, scattered CWS IT mac Blunted foveal reflex, central cystic changes, scattered MA/DBH/CWS greatest IT mac   Vessels attenuated, Tortuous, mild Copper wiring, mild AV crossing changes, dilated ST venule attenuated, Tortuous, mild  Copper wiring, mild AV crossing changes   Periphery Attached, 360 DBH and CWS posteriorly Attached, 360 DBH and CWS posteriorly           Refraction     Wearing Rx       Sphere Cylinder Axis Add   Right +2.25 +0.50 165 +2.00   Left +2.25 +0.75 172 +2.00         Manifest Refraction       Sphere Cylinder Axis Dist [facility]   Right +2.75 Sphere  NI   Left +3.25 +1.25 173 NI            IMAGING AND PROCEDURES  Imaging and Procedures for 06/23/2021  OCT, Retina - OU - Both Eyes       Right Eye Quality was good. Central Foveal Thickness: 275. Progression has no prior data. Findings include normal foveal contour, no IRF, no SRF, vitreomacular adhesion (Vitreous opacities, irregular lamination, scattered IRHM and cystic changes).   Left Eye Quality was good. Central Foveal Thickness: 336. Progression has no prior data. Findings include abnormal foveal contour, intraretinal fluid, intraretinal hyper-reflective material, no SRF (Focal IRF/DME temporal fovea and mac).   Notes *Images captured and stored on drive  Diagnosis / Impression:  OD: Vitreous opacities, irregular lamination, scattered IRHM and cystic changes OS: Focal IRF/DME temporal fovea and mac  Clinical management:  See below  Abbreviations: NFP - Normal foveal profile. CME - cystoid macular edema. PED - pigment epithelial detachment. IRF - intraretinal fluid. SRF - subretinal fluid. EZ - ellipsoid zone. ERM - epiretinal membrane. ORA - outer retinal atrophy. ORT - outer retinal tubulation. SRHM - subretinal hyper-reflective material. IRHM - intraretinal hyper-reflective material      Fluorescein Angiography Optos (Transit OD)       Right  Eye Progression has no prior data. Early phase findings include microaneurysm, vascular perfusion defect (Enlarged FAZ, scattered patches of vascular non-perfusion greatest temporal periphery ). Mid/Late phase findings include microaneurysm, vascular perfusion defect, leakage (Enlarged FAZ, scattered patches of vascular non-perfusion greatest temporal periphery, mild perivascular leakage temporal periphery and from MA, no NV).   Left Eye Progression has no prior data. Early phase findings include microaneurysm, vascular perfusion defect (Mild enlargement of FAZ). Mid/Late phase findings include leakage, microaneurysm, vascular perfusion defect (Mild enlargement of FAZ, scattered patches of vascular non-perfusion greatest temporal periphery, mild perivascular leakage temporal periphery and from MA, no NV).   Notes **Images stored on drive**  Impression: Severe NPDR OU Scattered MA OU No NV OU OD: Enlarged FAZ, scattered patches of vascular non-perfusion greatest temporal periphery, mild perivascular leakage temporal periphery and from MA, OS: Mild enlargement of FAZ, scattered patches of vascular non-perfusion greatest temporal periphery, mild perivascular leakage temporal periphery and from MA            ASSESSMENT/PLAN:    ICD-10-CM   1. Severe nonproliferative diabetic retinopathy of both eyes with macular edema associated with type 2 diabetes mellitus (HCC)  E11.3413 OCT, Retina - OU - Both Eyes    Fluorescein Angiography Optos (Transit OD)    2. Retinal ischemia  H35.82 Fluorescein Angiography Optos (Transit OD)    3. Essential hypertension  I10     4. Hypertensive retinopathy of both eyes  H35.033 Fluorescein Angiography Optos (Transit OD)    5. Vitreous hemorrhage of right eye ([place])  H43.11     6. Combined forms of age-related cataract of both eyes  H25.813       1. Severe non-proliferative diabetic retinopathy,  both eyes  - pt reports recent improvement in A1c from 11  down to 6 since starting Ozempic - The incidence, risk factors for progression, natural history and treatment options for diabetic retinopathy were discussed with patient.   - The need for close monitoring of blood glucose, blood pressure, and serum lipids, avoiding cigarette or any type of tobacco, and the need for long term follow up was also discussed with patient. - exam shows scattered MA, DBH and CWS OU, mild VH OD and central cystic changes OS - FA (12.23.22) scattered MA OU, no NV OU - OCT shows mild diabetic macular edema, both eyes  - The natural history, pathology, and characteristics of diabetic macular edema discussed with patient.  A generalized discussion of the major clinical trials concerning treatment of diabetic macular edema (ETDRS, DCT, SCORE, RISE / RIDE, and ongoing DRCR net studies) was completed.  This discussion included mention of the various approaches to treating diabetic macular edema (observation, laser photocoagulation, anti-VEGF injections with lucentis / Avastin / Eylea, steroid injections with Kenalog / Ozurdex, and intraocular surgery with vitrectomy).  The goal hemoglobin A1C of 6-7 was discussed, as well as importance of smoking cessation and hypertension control.  Need for ongoing treatment and monitoring were specifically discussed with reference to chronic nature of diabetic macular edema. - recommend IVA #1 OD today, 12.23.22 for diabetic retinopathy and VH OD - pt unable to be treated today due to elevated BP - f/u in 2-3 wks -- DFE/OCT, possible injection  2-4. Hypertensive retinopathy w/ retinal ischemia OU  - BP uncontrolled -- 170s / 110-120s in office today (12.23.22)  - FA 12.23.22 shows significant retinal ischemia OU -- enlarged FAZ OU (OD > OS) and significant patches of capillary drop out OU (OD>OS) - discussed importance of tight BP control and risk of MI and stroke w/ elevated BP - pt denies headache, chest pain, numbness/tingling but advised  immediate presentation to ED or Urgent Care for evaluation and management of hypertension -- pt in agreement and plans to go straight to Urgent Care - monitor  5. Vitreous hemorrhage OD  - mild blood stained vit condensations OD  - secondary to DM and uncontrolled BP  6. Mixed Cataract OU - The symptoms of cataract, surgical options, and treatments and risks were discussed with patient. - discussed diagnosis and progression - not yet visually significant - monitor for now  Ophthalmic Meds Ordered this visit:  No orders of the defined types were placed in this encounter.    Return for f/u 2-3 weeks, NPDR OU, DFE, OCT.  There are no Patient Instructions on file for this visit.   Explained the diagnoses, plan, and follow up with the patient and they expressed understanding.  Patient expressed understanding of the importance of proper follow up care.   This document serves as a record of services personally performed by Gardiner Sleeper, MD, PhD. It was created on their behalf by San Jetty. Owens Shark, OA an ophthalmic technician. The creation of this record is the provider's dictation and/or activities during the visit.    Electronically signed by: San Jetty. Owens Shark, [place] 12.23.2022 1:05 PM  Gardiner Sleeper, M.D., Ph.D. Diseases & Surgery of the Retina and Vitreous [facility]  I have reviewed the above documentation for accuracy and completeness, and I agree with the above. Gardiner Sleeper, M.D., Ph.D. 06/23/21 1:05 PM   Abbreviations: M myopia (nearsighted); A astigmatism; H hyperopia (farsighted); P presbyopia; Mrx spectacle  prescription;  CTL contact lenses; OD right eye; OS left eye; OU both eyes  XT exotropia; ET esotropia; PEK punctate epithelial keratitis; PEE punctate epithelial erosions; DES dry eye syndrome; MGD meibomian gland dysfunction; ATs artificial tears; PFAT's preservative free artificial tears; [place] nuclear sclerotic cataract; PSC posterior subcapsular  cataract; ERM epi-retinal membrane; PVD posterior vitreous detachment; RD retinal detachment; DM diabetes mellitus; DR diabetic retinopathy; NPDR non-proliferative diabetic retinopathy; PDR proliferative diabetic retinopathy; CSME clinically significant macular edema; DME diabetic macular edema; dbh dot blot hemorrhages; CWS cotton wool spot; POAG primary open angle glaucoma; C/D cup-to-disc ratio; HVF humphrey visual field; GVF goldmann visual field; OCT optical coherence tomography; IOP intraocular pressure; BRVO Branch retinal vein occlusion; CRVO central retinal vein occlusion; CRAO central retinal artery occlusion; BRAO branch retinal artery occlusion; RT retinal tear; SB scleral buckle; PPV pars plana vitrectomy; VH Vitreous hemorrhage; PRP panretinal laser photocoagulation; IVK intravitreal kenalog; VMT vitreomacular traction; MH Macular hole;  NVD neovascularization of the disc; NVE neovascularization elsewhere; AREDS age related eye disease study; ARMD age related macular degeneration; POAG primary open angle glaucoma; EBMD epithelial/anterior basement membrane dystrophy; ACIOL anterior chamber intraocular lens; IOL intraocular lens; PCIOL posterior chamber intraocular lens; Phaco/IOL phacoemulsification with intraocular lens placement; [place] photorefractive keratectomy; LASIK laser assisted in situ keratomileusis; HTN hypertension; DM diabetes mellitus; COPD chronic obstructive pulmonary disease

## 2021-07-07 NOTE — Progress Notes (Deleted)
[facility] - Clinic Note  07/10/2021     CHIEF COMPLAINT Patient presents for No chief complaint on file.   HISTORY OF PRESENT ILLNESS: [name] is a 56 y.o. male who presents to the clinic today for:    [facility], pt went yesterday for his routine eye exam bc he has been waking up with a "bright light" in his vision, pt states he was told he has bleeding in his eyes if left untreated, could become a detachment, pt states his A1c has come down from 11 to 6, pt also endorses being hypertensive  Referring physician: Clinic, [facility] [address] [facility] [place],  [place] [address]  HISTORICAL INFORMATION:   Selected notes from the MEDICAL RECORD NUMBER Referred by [facility] for retinal hemes LEE:  Ocular Hx- PMH-    CURRENT MEDICATIONS: No current outpatient medications on file. (Ophthalmic Drugs)   No current facility-administered medications for this visit. (Ophthalmic Drugs)   Current Outpatient Medications (Other)  Medication Sig   amLODipine (NORVASC) 10 MG tablet Take 10 mg by mouth daily. (Patient not taking: Reported on 06/23/2021)   amoxicillin-clavulanate (AUGMENTIN) 875-125 MG tablet Take 1 tablet by mouth every 12 (twelve) hours. (Patient not taking: Reported on 06/23/2021)   clindamycin (CLEOCIN) 300 MG capsule Take 1 capsule (300 mg total) by mouth 4 (four) times daily. X 7 days (Patient not taking: Reported on 06/23/2021)   diphenhydrAMINE (BENADRYL) 25 MG tablet Take 1 tablet (25 mg total) by mouth every 6 (six) hours as needed for itching or allergies.   docusate sodium (COLACE) 100 MG capsule Take 1 capsule (100 mg total) by mouth every 12 (twelve) hours.   Dulaglutide (TRULICITY ) Inject into the skin.   escitalopram (LEXAPRO) 10 MG tablet Take 10 mg by mouth daily.   glipiZIDE (GLUCOTROL) 10 MG tablet Take 1 tablet (10 mg total) by mouth 2 (two) times daily before a meal.    HYDROCHLOROTHIAZIDE PO Take by mouth.   insulin aspart protamine- aspart (NOVOLOG MIX 70/30) (70-30) 100 UNIT/ML injection Inject into the skin. (Patient not taking: Reported on 06/23/2021)   lisinopril (PRINIVIL,ZESTRIL) 10 MG tablet Take 10 mg by mouth daily.   loratadine (CLARITIN) 10 MG tablet TAKE 1 TABLET BY MOUTH ONCE DAILY   losartan (COZAAR) 100 MG tablet Take 100 mg by mouth daily. (Patient not taking: Reported on 06/23/2021)   OMEPRAZOLE PO Take by mouth.   oxyCODONE-acetaminophen (PERCOCET/ROXICET) 5-325 MG per tablet Take 2 tablets by mouth every 4 (four) hours as needed for pain.   polyethylene glycol (MIRALAX / GLYCOLAX) 17 g packet Take 17 g by mouth daily.   SUMAtriptan (IMITREX) 50 MG tablet Take 50 mg by mouth every 2 (two) hours as needed for migraine or headache. May repeat in 2 hours if headache persists or recurs.   testosterone cypionate (DEPOTESTOTERONE CYPIONATE) 100 MG/ML injection Inject 100 mg into the muscle every 14 (fourteen) days. For IM use only   Zolpidem Tartrate (AMBIEN PO) Take by mouth.   No current facility-administered medications for this visit. (Other)   REVIEW OF SYSTEMS:   ALLERGIES No Known Allergies  PAST MEDICAL HISTORY Past Medical History:  Diagnosis Date   Arthritis    Diabetes mellitus    Fistula, perirectal    GERD (gastroesophageal reflux disease)    Hypertension    PTSD (post-traumatic stress disorder)    Small bowel obstruction (HCC)  Past Surgical History:  Procedure Laterality Date   RECTAL SURGERY      FAMILY HISTORY No family history on file.  SOCIAL HISTORY Social History   Tobacco Use   Smoking status: Some Days    Types: Cigars   Smokeless tobacco: Never  Vaping Use   Vaping Use: Never used  Substance Use Topics   Alcohol use: Yes    Comment: occ   Drug use: No       OPHTHALMIC EXAM:  Not recorded     IMAGING AND PROCEDURES  Imaging and Procedures for 07/10/2021           ASSESSMENT/PLAN:    ICD-10-CM   1. Severe nonproliferative diabetic retinopathy of both eyes with macular edema associated with type 2 diabetes mellitus (HCC)  B63.8937     2. Retinal ischemia  H35.82     3. Essential hypertension  I10     4. Hypertensive retinopathy of both eyes  H35.033     5. Vitreous hemorrhage of right eye (HCC)  H43.11     6. Combined forms of age-related cataract of both eyes  H25.813        1. Severe non-proliferative diabetic retinopathy, both eyes  - s/p IVA OD #1 (12.23.22)  - pt reports recent improvement in A1c from 11 down to 6 since starting Ozempic - The incidence, risk factors for progression, natural history and treatment options for diabetic retinopathy were discussed with patient.   - The need for close monitoring of blood glucose, blood pressure, and serum lipids, avoiding cigarette or any type of tobacco, and the need for long term follow up was also discussed with patient. - exam shows scattered MA, DBH and CWS OU, mild VH OD and central cystic changes OS - FA (12.23.22) scattered MA OU, no NV OU - OCT shows mild diabetic macular edema, both eyes  - The natural history, pathology, and characteristics of diabetic macular edema discussed with patient.  A generalized discussion of the major clinical trials concerning treatment of diabetic macular edema (ETDRS, DCT, SCORE, RISE / RIDE, and ongoing DRCR net studies) was completed.  This discussion included mention of the various approaches to treating diabetic macular edema (observation, laser photocoagulation, anti-VEGF injections with lucentis / Avastin / Eylea, steroid injections with Kenalog / Ozurdex, and intraocular surgery with vitrectomy).  The goal hemoglobin A1C of 6-7 was discussed, as well as importance of smoking cessation and hypertension control.  Need for ongoing treatment and monitoring were specifically discussed with reference to chronic nature of diabetic macular edema. - recommend  IVA #2 OD today, 01.09.23 for diabetic retinopathy and VH OD - pt unable to be treated today due to elevated BP - f/u in 2-3 wks -- DFE/OCT, possible injection  2-4. Hypertensive retinopathy w/ retinal ischemia OU  - BP uncontrolled -- 170s / 110-120s in office today (12.23.22)  - FA 12.23.22 shows significant retinal ischemia OU -- enlarged FAZ OU (OD > OS) and significant patches of capillary drop out OU (OD>OS) - discussed importance of tight BP control and risk of MI and stroke w/ elevated BP - pt denies headache, chest pain, numbness/tingling but advised immediate presentation to ED or Urgent Care for evaluation and management of hypertension -- pt in agreement and plans to go straight to Urgent Care - monitor  5. Vitreous hemorrhage OD  - mild blood stained vit condensations OD  - secondary to DM and uncontrolled BP  6. Mixed Cataract OU - The symptoms of  cataract, surgical options, and treatments and risks were discussed with patient. - discussed diagnosis and progression - not yet visually significant - monitor for now  Ophthalmic Meds Ordered this visit:  No orders of the defined types were placed in this encounter.    No follow-ups on file.  There are no Patient Instructions on file for this visit.   Explained the diagnoses, plan, and follow up with the patient and they expressed understanding.  Patient expressed understanding of the importance of proper follow up care.   This document serves as a record of services personally performed by Karie ChimeraBrian G. Stetson Pelaez, MD, PhD. It was created on their behalf by Annalee Gentaaryl Barber, COMT. The creation of this record is the provider's dictation and/or activities during the visit.  Electronically signed by: Annalee Gentaaryl Barber, COMT 07/07/21 2:12 PM    Karie ChimeraBrian G. Rim Thatch, M.D., Ph.D. Diseases & Surgery of the Retina and Vitreous [facility]     Abbreviations: M myopia (nearsighted); A astigmatism; H hyperopia  (farsighted); P presbyopia; Mrx spectacle prescription;  CTL contact lenses; OD right eye; OS left eye; OU both eyes  XT exotropia; ET esotropia; PEK punctate epithelial keratitis; PEE punctate epithelial erosions; DES dry eye syndrome; MGD meibomian gland dysfunction; ATs artificial tears; PFAT's preservative free artificial tears; NSC nuclear sclerotic cataract; PSC posterior subcapsular cataract; ERM epi-retinal membrane; PVD posterior vitreous detachment; RD retinal detachment; DM diabetes mellitus; DR diabetic retinopathy; NPDR non-proliferative diabetic retinopathy; PDR proliferative diabetic retinopathy; CSME clinically significant macular edema; DME diabetic macular edema; dbh dot blot hemorrhages; CWS cotton wool spot; POAG primary open angle glaucoma; C/D cup-to-disc ratio; HVF humphrey visual field; GVF goldmann visual field; OCT optical coherence tomography; IOP intraocular pressure; BRVO Branch retinal vein occlusion; CRVO central retinal vein occlusion; CRAO central retinal artery occlusion; BRAO branch retinal artery occlusion; RT retinal tear; SB scleral buckle; PPV pars plana vitrectomy; VH Vitreous hemorrhage; PRP panretinal laser photocoagulation; IVK intravitreal kenalog; VMT vitreomacular traction; MH Macular hole;  NVD neovascularization of the disc; NVE neovascularization elsewhere; AREDS age related eye disease study; ARMD age related macular degeneration; POAG primary open angle glaucoma; EBMD epithelial/anterior basement membrane dystrophy; ACIOL anterior chamber intraocular lens; IOL intraocular lens; PCIOL posterior chamber intraocular lens; Phaco/IOL phacoemulsification with intraocular lens placement; PRK photorefractive keratectomy; LASIK laser assisted in situ keratomileusis; HTN hypertension; DM diabetes mellitus; COPD chronic obstructive pulmonary disease

## 2021-07-10 ENCOUNTER — Emergency Department (HOSPITAL_BASED_OUTPATIENT_CLINIC_OR_DEPARTMENT_OTHER)
Admission: EM | Admit: 2021-07-10 | Discharge: 2021-07-10 | Disposition: A | Discharge status: Home / Self Care | Payer: No Typology Code available for payment source | Attending: Emergency Medicine | Admitting: Emergency Medicine

## 2021-07-10 ENCOUNTER — Emergency Department (HOSPITAL_BASED_OUTPATIENT_CLINIC_OR_DEPARTMENT_OTHER): Payer: No Typology Code available for payment source

## 2021-07-10 ENCOUNTER — Other Ambulatory Visit: Payer: Self-pay

## 2021-07-10 ENCOUNTER — Encounter (INDEPENDENT_AMBULATORY_CARE_PROVIDER_SITE_OTHER): Payer: Self-pay

## 2021-07-10 ENCOUNTER — Encounter (HOSPITAL_BASED_OUTPATIENT_CLINIC_OR_DEPARTMENT_OTHER): Payer: Self-pay | Admitting: *Deleted

## 2021-07-10 ENCOUNTER — Encounter (INDEPENDENT_AMBULATORY_CARE_PROVIDER_SITE_OTHER): Payer: No Typology Code available for payment source | Admitting: Ophthalmology

## 2021-07-10 DIAGNOSIS — I10 Essential (primary) hypertension: Secondary | ICD-10-CM

## 2021-07-10 DIAGNOSIS — Z79899 Other long term (current) drug therapy: Secondary | ICD-10-CM | POA: Insufficient documentation

## 2021-07-10 DIAGNOSIS — N390 Urinary tract infection, site not specified: Secondary | ICD-10-CM | POA: Diagnosis not present

## 2021-07-10 DIAGNOSIS — E878 Other disorders of electrolyte and fluid balance, not elsewhere classified: Secondary | ICD-10-CM | POA: Diagnosis not present

## 2021-07-10 DIAGNOSIS — Z7984 Long term (current) use of oral hypoglycemic drugs: Secondary | ICD-10-CM | POA: Insufficient documentation

## 2021-07-10 DIAGNOSIS — K859 Acute pancreatitis without necrosis or infection, unspecified: Secondary | ICD-10-CM | POA: Insufficient documentation

## 2021-07-10 DIAGNOSIS — M5489 Other dorsalgia: Secondary | ICD-10-CM | POA: Diagnosis not present

## 2021-07-10 DIAGNOSIS — E871 Hypo-osmolality and hyponatremia: Secondary | ICD-10-CM | POA: Diagnosis not present

## 2021-07-10 DIAGNOSIS — H25813 Combined forms of age-related cataract, bilateral: Secondary | ICD-10-CM

## 2021-07-10 DIAGNOSIS — E113413 Type 2 diabetes mellitus with severe nonproliferative diabetic retinopathy with macular edema, bilateral: Secondary | ICD-10-CM

## 2021-07-10 DIAGNOSIS — H35033 Hypertensive retinopathy, bilateral: Secondary | ICD-10-CM

## 2021-07-10 DIAGNOSIS — E119 Type 2 diabetes mellitus without complications: Secondary | ICD-10-CM | POA: Diagnosis not present

## 2021-07-10 DIAGNOSIS — H4311 Vitreous hemorrhage, right eye: Secondary | ICD-10-CM

## 2021-07-10 DIAGNOSIS — H3582 Retinal ischemia: Secondary | ICD-10-CM

## 2021-07-10 DIAGNOSIS — R1013 Epigastric pain: Secondary | ICD-10-CM | POA: Diagnosis present

## 2021-07-10 LAB — COMPREHENSIVE METABOLIC PANEL
ALT: 8 U/L (ref 0–44)
AST: 20 U/L (ref 15–41)
Albumin: 3.9 g/dL (ref 3.5–5.0)
Alkaline Phosphatase: 48 U/L (ref 38–126)
Anion gap: 10 (ref 5–15)
BUN: 10 mg/dL (ref 6–20)
CO2: 25 mmol/L (ref 22–32)
Calcium: 9.5 mg/dL (ref 8.9–10.3)
Chloride: 96 mmol/L — ABNORMAL LOW (ref 98–111)
Creatinine, Ser: 1.01 mg/dL (ref 0.61–1.24)
GFR, Estimated: >60 mL/min (ref >60)
Glucose, Bld: 135 mg/dL — ABNORMAL HIGH (ref 70–99)
Potassium: 4.2 mmol/L (ref 3.5–5.1)
Sodium: 131 mmol/L — ABNORMAL LOW (ref 135–145)
Total Bilirubin: 0.7 mg/dL (ref 0.3–1.2)
Total Protein: 7.9 g/dL (ref 6.5–8.1)

## 2021-07-10 LAB — URINALYSIS, ROUTINE W REFLEX MICROSCOPIC
Bilirubin Urine: NEGATIVE
Glucose, UA: 100 mg/dL — AB
Ketones, ur: NEGATIVE mg/dL
Nitrite: NEGATIVE
Protein, ur: 30 mg/dL — AB
Specific Gravity, Urine: 1.02 (ref 1.005–1.030)
pH: 6 (ref 5.0–8.0)

## 2021-07-10 LAB — CBC
HCT: 53.5 % — ABNORMAL HIGH (ref 39.0–52.0)
Hemoglobin: 19.1 g/dL — ABNORMAL HIGH (ref 13.0–17.0)
MCH: 32.5 pg (ref 26.0–34.0)
MCHC: 35.7 g/dL (ref 30.0–36.0)
MCV: 91.1 fL (ref 80.0–100.0)
Platelets: 241 10*3/uL (ref 150–400)
RBC: 5.87 MIL/uL — ABNORMAL HIGH (ref 4.22–5.81)
RDW: 12.6 % (ref 11.5–15.5)
WBC: 4.8 10*3/uL (ref 4.0–10.5)
nRBC: 0 % (ref 0.0–0.2)

## 2021-07-10 LAB — URINALYSIS, MICROSCOPIC (REFLEX)

## 2021-07-10 LAB — LIPASE, BLOOD: Lipase: 101 U/L — ABNORMAL HIGH (ref 11–51)

## 2021-07-10 MED ORDER — IOHEXOL 300 MG/ML  SOLN
100.0000 mL | Freq: Once | INTRAMUSCULAR | Status: AC | PRN
  Administered 2021-07-10: 100 mL via INTRAVENOUS

## 2021-07-10 MED ORDER — ONDANSETRON HCL 4 MG/2ML IJ SOLN
4.0000 mg | Freq: Once | INTRAMUSCULAR | Status: AC
  Administered 2021-07-10: 4 mg via INTRAVENOUS
  Filled 2021-07-10: qty 2

## 2021-07-10 MED ORDER — ONDANSETRON HCL 4 MG PO TABS
4.0000 mg | ORAL_TABLET | Freq: Four times a day (QID) | ORAL | 0 refills | Status: AC
Start: 2021-07-10 — End: 2021-07-15

## 2021-07-10 MED ORDER — MORPHINE SULFATE (PF) 4 MG/ML IV SOLN
4.0000 mg | Freq: Once | INTRAVENOUS | Status: AC
  Administered 2021-07-10: 4 mg via INTRAVENOUS
  Filled 2021-07-10: qty 1

## 2021-07-10 MED ORDER — SODIUM CHLORIDE 0.9 % IV BOLUS
1000.0000 mL | Freq: Once | INTRAVENOUS | Status: AC
  Administered 2021-07-10: 1000 mL via INTRAVENOUS

## 2021-07-10 NOTE — ED Provider Notes (Signed)
[facility] Provider Note   CSN: [id number] Arrival date & time: 07/10/21  1421     History  Chief Complaint  Patient presents with   Abdominal Pain    [name] is a 56 y.o. male with a past medical history of hypertension and diabetes presenting today with complaint of abdominal pain that has been going on for a few weeks.  Reports that over the past few days it has worsened, he has been nauseous and vomiting and experiencing pain after urination.  Denies any dysuria or hematuria but reports that his bladder feels like it is "on fire" after he finishes urinating.  Has a history of multiple urinary tract infections secondary to being uncircumcised.  Endorsed some constipation last week however he took laxatives on Thursday and has had normal bowel movements since.  Has never had abdominal surgeries.  Denies any fever or chills.  No known sick contacts.  No recent antibiotic use.  No travel.  Reports that he has chronic back pain however no acute worsening.    Home Medications Prior to Admission medications   Medication Sig Start Date End Date Taking? Authorizing Provider  amLODipine (NORVASC) 10 MG tablet Take 10 mg by mouth daily. Patient not taking: Reported on 06/23/2021    [provider]  amoxicillin-clavulanate (AUGMENTIN) 875-125 MG tablet Take 1 tablet by mouth every 12 (twelve) hours. Patient not taking: Reported on 06/23/2021 03/11/21   Delia Heady, PA-C  clindamycin (CLEOCIN) 300 MG capsule Take 1 capsule (300 mg total) by mouth 4 (four) times daily. X 7 days Patient not taking: Reported on 06/23/2021 07/10/20   Veryl Speak, MD  diphenhydrAMINE (BENADRYL) 25 MG tablet Take 1 tablet (25 mg total) by mouth every 6 (six) hours as needed for itching or allergies. 01/08/14   Ernestina Patches, MD  docusate sodium (COLACE) 100 MG capsule Take 1 capsule (100 mg total) by mouth every 12 (twelve) hours. 03/19/20   Charlesetta Shanks, MD  Dulaglutide  (TRULICITY [place]) Inject into the skin.    [provider]  escitalopram (LEXAPRO) 10 MG tablet Take 10 mg by mouth daily.    [provider]  glipiZIDE (GLUCOTROL) 10 MG tablet Take 1 tablet (10 mg total) by mouth 2 (two) times daily before a meal. 05/30/11 05/29/12  Mylinda Latina, MD  HYDROCHLOROTHIAZIDE PO Take by mouth.    [provider]  insulin aspart protamine- aspart (NOVOLOG MIX 70/30) (70-30) 100 UNIT/ML injection Inject into the skin. Patient not taking: Reported on 06/23/2021    [provider]  lisinopril (PRINIVIL,ZESTRIL) 10 MG tablet Take 10 mg by mouth daily.    [provider]  loratadine (CLARITIN) 10 MG tablet TAKE 1 TABLET BY MOUTH ONCE DAILY 05/04/20 05/04/21  Dorie Rank, MD  losartan (COZAAR) 100 MG tablet Take 100 mg by mouth daily. Patient not taking: Reported on 06/23/2021    [provider]  OMEPRAZOLE PO Take by mouth.    [provider]  oxyCODONE-acetaminophen (PERCOCET/ROXICET) 5-325 MG per tablet Take 2 tablets by mouth every 4 (four) hours as needed for pain. 01/31/13   Ripley Fraise, MD  polyethylene glycol (MIRALAX / GLYCOLAX) 17 g packet Take 17 g by mouth daily. 03/19/20   Charlesetta Shanks, MD  SUMAtriptan (IMITREX) 50 MG tablet Take 50 mg by mouth every 2 (two) hours as needed for migraine or headache. May repeat in 2 hours if headache persists or recurs.    [provider]  testosterone cypionate (  DEPOTESTOTERONE CYPIONATE) 100 MG/ML injection Inject 100 mg into the muscle every 14 (fourteen) days. For IM use only    [provider]  Zolpidem Tartrate (AMBIEN PO) Take by mouth.    [provider]  QUEtiapine (SEROQUEL) 25 MG tablet Take 25 mg by mouth at bedtime.  05/04/20  [provider]  ranitidine (ZANTAC) 300 MG tablet Take 1 tablet (300 mg total) by mouth 2 (two) times daily. 01/08/14 05/04/20  Ernestina Patches, MD      Allergies    Patient has no known  allergies.    Review of Systems   Review of Systems  Constitutional:  Positive for appetite change. Negative for chills and fever.  Respiratory:  Negative for chest tightness.   Gastrointestinal:  Positive for abdominal pain, constipation, nausea and vomiting. Negative for diarrhea.   Physical Exam Updated Vital Signs BP (!) 170/118 (BP Location: Right Arm)    Pulse 91    Temp 97.7 F (36.5 C) (Oral)    Resp 18    Ht 5\' 9"  (1.753 m)    Wt 86.2 kg    SpO2 99%    BMI 28.06 kg/m  Physical Exam Vitals and nursing note reviewed.  Constitutional:      General: He is not in acute distress.    Appearance: Normal appearance. He is not ill-appearing.  HENT:     Head: Normocephalic and atraumatic.     Mouth/Throat:     Mouth: Mucous membranes are moist.     Pharynx: Oropharynx is clear.  Eyes:     General: No scleral icterus.    Conjunctiva/sclera: Conjunctivae normal.  Cardiovascular:     Rate and Rhythm: Normal rate and regular rhythm.  Pulmonary:     Effort: Pulmonary effort is normal. No respiratory distress.  Abdominal:     General: Abdomen is flat.     Palpations: Abdomen is soft.     Tenderness: There is abdominal tenderness in the epigastric area and periumbilical area. There is no right CVA tenderness or left CVA tenderness. Negative signs include McBurney's sign.     Hernia: No hernia is present.  Skin:    General: Skin is warm and dry.     Findings: No rash.  Neurological:     Mental Status: He is alert.  Psychiatric:        Mood and Affect: Mood normal.    ED Results / Procedures / Treatments   Labs (all labs ordered are listed, but only abnormal results are displayed) Labs Reviewed  LIPASE, BLOOD - Abnormal; Notable for the following components:      Result Value   Lipase 101 (*)    All other components within normal limits  COMPREHENSIVE METABOLIC PANEL - Abnormal; Notable for the following components:   Sodium 131 (*)    Chloride 96 (*)    Glucose, Bld 135  (*)    All other components within normal limits  CBC - Abnormal; Notable for the following components:   RBC 5.87 (*)    Hemoglobin 19.1 (*)    HCT 53.5 (*)    All other components within normal limits  URINALYSIS, ROUTINE W REFLEX MICROSCOPIC - Abnormal; Notable for the following components:   APPearance HAZY (*)    Glucose, UA 100 (*)    Hgb urine dipstick SMALL (*)    Protein, ur 30 (*)    Leukocytes,Ua SMALL (*)    All other components within normal limits  URINALYSIS, MICROSCOPIC (REFLEX) - Abnormal;  Notable for the following components:   Bacteria, UA FEW (*)    Non Squamous Epithelial PRESENT (*)    All other components within normal limits    EKG None  Radiology CT ABDOMEN PELVIS W CONTRAST  Result Date: 07/10/2021 CLINICAL DATA:  Acute abdominal pain. EXAM: CT ABDOMEN AND PELVIS WITH CONTRAST TECHNIQUE: Multidetector CT imaging of the abdomen and pelvis was performed using the standard protocol following bolus administration of intravenous contrast. CONTRAST:  133mL OMNIPAQUE IOHEXOL 300 MG/ML  SOLN COMPARISON:  CT abdomen and pelvis 06/09/2020. FINDINGS: Lower chest: No acute abnormality. Hepatobiliary: No focal liver abnormality is seen. No gallstones, gallbladder wall thickening, or biliary dilatation. Pancreas: There is mild inflammatory stranding surrounding the head and neck of the pancreas. No evidence for ductal dilatation or fluid collection. Spleen: Normal in size without focal abnormality. Adrenals/Urinary Tract: Adrenal glands are unremarkable. Kidneys are normal, without renal calculi, focal lesion, or hydronephrosis. The bilateral adrenal glands are within normal limits. There are bilateral renal cysts and hypodensities which are too small to characterize measuring up to 3.3 cm similar to the prior study. There is no hydronephrosis or perinephric fluid. Bladder is within normal limits. Stomach/Bowel: Stomach is within normal limits. Appendix appears normal. No  evidence of bowel wall thickening, distention, or inflammatory changes. There is a large amount of stool throughout colon. Vascular/Lymphatic: Aortic atherosclerosis. No enlarged abdominal or pelvic lymph nodes. Reproductive: Prostate gland is enlarged, unchanged. Other: No abdominal wall hernia or abnormality. No abdominopelvic ascites. Musculoskeletal: No acute or significant osseous findings. IMPRESSION: 1. Acute uncomplicated pancreatitis involving the head and neck of the pancreas. 2. Prostatomegaly. 3.  Aortic Atherosclerosis (ICD10-I70.0). Electronically Signed   By: Ronney Asters M.D.   On: 07/10/2021 19:28    Procedures Procedures    Medications Ordered in ED Medications - No data to display  ED Course/ Medical Decision Making/ A&P Clinical Course as of 07/10/21 2329  Mon Jul 10, 2021  1813 WBC, UA: 6-10 [MR]    Clinical Course User Index [MR] Janie Strothman, Cecilio Asper, PA-C                           Medical Decision Making  56 year old male presenting today with a complaint of abdominal pain that has been going on for 2 weeks however acutely worsened over the last few days. The differential diagnosis for generalized abdominal pain includes, but is not limited to AAA, gastroenteritis, appendicitis, Bowel obstruction, Bowel perforation. Gastroparesis, DKA, Hernia, Inflammatory bowel disease, mesenteric ischemia, pancreatitis, peritonitis SBP, volvulus.     The patient's history was obtained from both him and his wife and upon further questioning they reported that patient has a history of alcohol abuse however barely drinks, only wine glass occasionally.  His diet consists of many fried and fatty foods.  His wife reports that she has been trying to help him eat healthier.   Co morbidities that complicate the patient evaluation  Hypertension, diabetes and remote alcohol use disorder   Lab Tests:  I Ordered, and personally interpreted labs.  The pertinent results include: A lipase of  101 and hyponatremia and hypochloremia.    Imaging Studies ordered:  I ordered imaging studies including CT abdomen. I independently visualized and interpreted imaging which showed an acute pancreatitis. I agree with the radiologist interpretation   Medicines ordered and prescription drug management:  I ordered medication including Zofran and morphine for nausea and vomiting.  This helped his symptoms.  Problem List / ED Course:  Acute pancreatitis  Dispostion:  After consideration of the diagnostic results and the patients response to treatment, I feel that the patent would benefit from pain control and a bland diet.  He will follow-up with gastroenterology as needed.  The importance of avoiding alcohol and fried and fatty food was discussed with him and his wife.  He voiced understanding.  They were discharged with information about pancreatitis and proper diet.          Final Clinical Impression(s) / ED Diagnoses Final diagnoses:  Acute pancreatitis without infection or necrosis, unspecified pancreatitis type  Lower urinary tract infectious disease    Rx / DC Orders Results and diagnoses were explained to the patient. Return precautions discussed in full. Patient had no additional questions and expressed complete understanding.   This chart was dictated using voice recognition software.  Despite best efforts to proofread,  errors can occur which can change the documentation meaning.    Darliss Ridgel 07/10/21 2337    Blanchie Dessert, MD 07/14/21 [phone]

## 2021-07-10 NOTE — ED Notes (Signed)
Pt transported to CT ?

## 2021-07-10 NOTE — ED Triage Notes (Signed)
Abdominal pain x 4 days. He felt constipated and took laxatives until he had a BM. Pain improved but did not totally resolve.

## 2021-07-10 NOTE — Discharge Instructions (Addendum)
Please read the information about pancreatitis attached to these discharge papers.  Additionally, a clinic that you can follow-up with is attached to this discharge papers since you requested a new primary care provider.  There is also a gastroenterology office that you may call if your symptoms are not resolving over the next week.  You may take your Percocet and ibuprofen for your discomfort.  I sent nausea medication to your pharmacy in the event that you need it.  Hopefully your pain will begin to resolve with your bland diet.

## 2021-07-11 NOTE — Progress Notes (Signed)
[facility] Note  07/12/2021     CHIEF COMPLAINT Patient presents for Retina Follow Up   HISTORY OF PRESENT ILLNESS: [name] is a 56 y.o. male who presents to the clinic today for:   HPI     Retina Follow Up   Patient presents with  Diabetic Retinopathy.  In both eyes.  Severity is moderate.  Duration of 3 weeks.  Since onset it is gradually worsening.  I, the attending physician,  performed the HPI with the patient and updated documentation appropriately.        Comments   3 week retina follow up NPDR ou Patient states vision is getting worse. Patient has not checked blood sugar.      Last edited by Bernarda Caffey, MD on 07/12/2021  9:48 PM.    Pt came in on Monday, but was not feeling well and went to the ED and was dx with acute pancreatitis and given medication for nausea, his BP in office today is 162/100, he states he is not seeing floaters  Referring physician: [facility], [place] [address] [place],  [place] [address]  HISTORICAL INFORMATION:   Selected notes from the MEDICAL RECORD NUMBER Referred by [facility] for retinal hemes LEE:  Ocular Hx- PMH-    CURRENT MEDICATIONS: No current outpatient medications on file. (Ophthalmic Drugs)   No current facility-administered medications for this visit. (Ophthalmic Drugs)   Current Outpatient Medications (Other)  Medication Sig   diphenhydrAMINE (BENADRYL) 25 MG tablet Take 1 tablet (25 mg total) by mouth every 6 (six) hours as needed for itching or allergies.   docusate sodium (COLACE) 100 MG capsule Take 1 capsule (100 mg total) by mouth every 12 (twelve) hours.   Dulaglutide (TRULICITY [place]) Inject into the skin.   escitalopram (LEXAPRO) 10 MG tablet Take 10 mg by mouth daily.   HYDROCHLOROTHIAZIDE PO Take by mouth.   lisinopril (PRINIVIL,ZESTRIL) 10 MG tablet Take 10 mg by mouth daily.   OMEPRAZOLE PO Take by mouth.   ondansetron (ZOFRAN) 4 MG tablet Take 1  tablet (4 mg total) by mouth every 6 (six) hours for 5 days.   oxyCODONE-acetaminophen (PERCOCET/ROXICET) 5-325 MG per tablet Take 2 tablets by mouth every 4 (four) hours as needed for pain.   polyethylene glycol (MIRALAX / GLYCOLAX) 17 g packet Take 17 g by mouth daily.   SUMAtriptan (IMITREX) 50 MG tablet Take 50 mg by mouth every 2 (two) hours as needed for migraine or headache. May repeat in 2 hours if headache persists or recurs.   testosterone cypionate (DEPOTESTOTERONE CYPIONATE) 100 MG/ML injection Inject 100 mg into the muscle every 14 (fourteen) days. For IM use only   Zolpidem Tartrate (AMBIEN PO) Take by mouth.   amLODipine (NORVASC) 10 MG tablet Take 10 mg by mouth daily. (Patient not taking: Reported on 06/23/2021)   amoxicillin-clavulanate (AUGMENTIN) 875-125 MG tablet Take 1 tablet by mouth every 12 (twelve) hours. (Patient not taking: Reported on 06/23/2021)   clindamycin (CLEOCIN) 300 MG capsule Take 1 capsule (300 mg total) by mouth 4 (four) times daily. X 7 days (Patient not taking: Reported on 06/23/2021)   glipiZIDE (GLUCOTROL) 10 MG tablet Take 1 tablet (10 mg total) by mouth 2 (two) times daily before a meal.   insulin aspart protamine- aspart (NOVOLOG MIX 70/30) (70-30) 100 UNIT/ML injection Inject into the skin. (Patient not taking: Reported on 06/23/2021)   loratadine (CLARITIN) 10 MG tablet TAKE 1 TABLET BY MOUTH ONCE  DAILY   losartan (COZAAR) 100 MG tablet Take 100 mg by mouth daily. (Patient not taking: Reported on 06/23/2021)   No current facility-administered medications for this visit. (Other)   REVIEW OF SYSTEMS: ROS   Positive for: Endocrine, Eyes Negative for: Constitutional, Gastrointestinal, Neurological, Skin, Genitourinary, Musculoskeletal, HENT, Cardiovascular, Respiratory, Psychiatric, Allergic/Imm, Heme/Lymph Last edited by Elmore Guise, COT on 07/12/2021  1:23 PM.      ALLERGIES No Known Allergies  PAST MEDICAL HISTORY Past Medical History:   Diagnosis Date   Arthritis    Diabetes mellitus    Fistula, perirectal    GERD (gastroesophageal reflux disease)    Hypertension    PTSD (post-traumatic stress disorder)    Small bowel obstruction ([place])    Past Surgical History:  Procedure Laterality Date   RECTAL SURGERY      FAMILY HISTORY History reviewed. No pertinent family history.  SOCIAL HISTORY Social History   Tobacco Use   Smoking status: Some Days    Types: Cigars   Smokeless tobacco: Never  Vaping Use   Vaping Use: Never used  Substance Use Topics   Alcohol use: Not Currently    Comment: occ   Drug use: No       OPHTHALMIC EXAM:  Base Eye Exam     Visual Acuity (Snellen - Linear)       Right Left   Dist cc 20/200 20/25   Dist ph cc 20/100-1 20NI    Correction: Glasses         Tonometry (Tonopen, 1:31 PM)       Right Left   Pressure 20 14         Pupils       Dark Light Shape React APD   Right 3 2 Round Minimal None   Left 3 2 Round Minimal None         Visual Fields (Counting fingers)       Left Right    Full Full         Extraocular Movement       Right Left    Full, Ortho Full, Ortho         Neuro/Psych     Oriented x3: Yes   Mood/Affect: Normal         Dilation     Both eyes: 1.0% Mydriacyl, 2.5% Phenylephrine @ 1:31 PM           Slit Lamp and Fundus Exam     Slit Lamp Exam       Right Left   Lids/Lashes Dermatochalasis - upper lid Dermatochalasis - upper lid   Conjunctiva/Sclera mild melanosis mild melanosis, nasal and temporal pinguecula   Cornea arcus, trace PEE arcus, trace PEE, mild tear film debris   Anterior Chamber Deep and quiet Deep and quiet   Iris Round and dilated, No NVI Round and dilated, No NVI   Lens 2+ Nuclear sclerosis, 2+ Cortical cataract 2-3+ Nuclear sclerosis, 2-3+ Cortical cataract   Anterior Vitreous Vitreous syneresis, mild blood stained vitreous condensations Vitreous syneresis         Fundus Exam        Right Left   Disc Pink and Sharp, no NVD Pink and Sharp, no NVD   C/D Ratio 0.1 0.2   Macula Blunted foveal reflex, scattered MA/DBH, scattered CWS IT mac Blunted foveal reflex, central cystic changes - improved, scattered MA/DBH/CWS greatest temporal mac   Vessels attenuated, Tortuous, mild Copper wiring attenuated, Tortuous, mild Copper wiring, mild  AV crossing changes   Periphery Attached, 360 DBH and CWS posteriorly Attached, 360 DBH and CWS posteriorly           Refraction     Wearing Rx       Sphere Cylinder Axis Add   Right +2.25 +0.50 165 +2.00   Left +2.25 +0.75 172 +2.00         Manifest Refraction (Auto)       Sphere Cylinder Axis Dist VA   Right +2.25 +0.75 174 20/80   Left +3.25 +1.50 168 20/25-2            IMAGING AND PROCEDURES  Imaging and Procedures for 07/12/2021  OCT, Retina - OU - Both Eyes       Right Eye Quality was good. Central Foveal Thickness: 268. Progression has improved. Findings include normal foveal contour, no IRF, no SRF, vitreomacular adhesion (Mild interval improvement in vitreous opacities, irregular lamination, scattered IRHM and cystic changes).   Left Eye Quality was good. Central Foveal Thickness: 302. Progression has improved. Findings include abnormal foveal contour, intraretinal hyper-reflective material, no SRF, no IRF (Focal IRF/DME temporal fovea and mac -- improved).   Notes *Images captured and stored on drive  Diagnosis / Impression:  OD: Mild interval improvement in vitreous opacities, irregular lamination, scattered IRHM and cystic changes OS: Focal IRF/DME temporal fovea and mac -- improved  Clinical management:  See below  Abbreviations: NFP - Normal foveal profile. CME - cystoid macular edema. PED - pigment epithelial detachment. IRF - intraretinal fluid. SRF - subretinal fluid. EZ - ellipsoid zone. ERM - epiretinal membrane. ORA - outer retinal atrophy. ORT - outer retinal tubulation. SRHM - subretinal  hyper-reflective material. IRHM - intraretinal hyper-reflective material             ASSESSMENT/PLAN:    ICD-10-CM   1. Severe nonproliferative diabetic retinopathy of both eyes with macular edema associated with type 2 diabetes mellitus (HCC)  E11.3413 OCT, Retina - OU - Both Eyes    2. Hypertensive retinopathy of both eyes  H35.033     3. Essential hypertension  I10     4. Vitreous hemorrhage of right eye ([place])  H43.11     5. Combined forms of age-related cataract of both eyes  H25.813      1. Severe non-proliferative diabetic retinopathy, both eyes  - pt reports recent improvement in A1c from 11 down to 6 since starting Ozempic - exam shows scattered MA, DBH and CWS OU, mild VH OD and central cystic changes OS -- improving - FA (12.23.22) scattered MA OU, no NV OU - OCT shows mild diabetic macular edema, both eyes, but improved from prior - recommend IVA #1 OD 12.23.22 for diabetic retinopathy and VH OD, but  pt was unable to be treated today due to elevated BP - today, no retinal or ophthalmic intervention recommended as hemorrages and edema have improved with improved BP and BG control - f/u in 4-6 wks -- DFE/OCT, possible injection  2,3. Hypertensive retinopathy w/ retinal ischemia OU  - BP uncontrolled -- 170s / 110-120s in office 12.23.22, today BP is 162/100 -- still uncontrolled  - pt was to f/u with PCP or go straight to Urgent Care following last visit, but did not do either  - FA 12.23.22 shows significant retinal ischemia OU -- enlarged FAZ OU (OD > OS) and significant patches of capillary drop out OU (OD>OS) - discussed importance of tight BP control and risk of MI and stroke w/  elevated BP - pt denies headache, chest pain, numbness/tingling but advised medical f/u - will refer to [facility]  4. Vitreous hemorrhage OD  - mild blood stained vit condensations OD  - secondary to DM and uncontrolled BP  - improving  5. Mixed Cataract  OU - The symptoms of cataract, surgical options, and treatments and risks were discussed with patient. - discussed diagnosis and progression - approaching visual significance - will refer to [facility] for cat eval and establishment of primary eye care  Ophthalmic Meds Ordered this visit:  No orders of the defined types were placed in this encounter.    Return for f/u 4-6 weeks, NPDR OU, DFE, OCT.  There are no Patient Instructions on file for this visit.   Explained the diagnoses, plan, and follow up with the patient and they expressed understanding.  Patient expressed understanding of the importance of proper follow up care.   This document serves as a record of services personally performed by Gardiner Sleeper, MD, PhD. It was created on their behalf by Orvan Falconer, an ophthalmic technician. The creation of this record is the provider's dictation and/or activities during the visit.    Electronically signed by: Orvan Falconer, OA, 07/12/21  9:54 PM  This document serves as a record of services personally performed by Gardiner Sleeper, MD, PhD. It was created on their behalf by San Jetty. Owens Shark, OA an ophthalmic technician. The creation of this record is the provider's dictation and/or activities during the visit.    Electronically signed by: San Jetty. Owens Shark, [place] 01.11.2023 9:54 PM  Gardiner Sleeper, M.D., Ph.D. Diseases & Surgery of the Retina and Vitreous [facility]  I have reviewed the above documentation for accuracy and completeness, and I agree with the above. Gardiner Sleeper, M.D., Ph.D. 07/12/21 9:54 PM  Abbreviations: M myopia (nearsighted); A astigmatism; H hyperopia (farsighted); P presbyopia; Mrx spectacle prescription;  CTL contact lenses; OD right eye; OS left eye; OU both eyes  XT exotropia; ET esotropia; PEK punctate epithelial keratitis; PEE punctate epithelial erosions; DES dry eye syndrome; MGD meibomian gland dysfunction; ATs  artificial tears; PFAT's preservative free artificial tears; [place] nuclear sclerotic cataract; PSC posterior subcapsular cataract; ERM epi-retinal membrane; PVD posterior vitreous detachment; RD retinal detachment; DM diabetes mellitus; DR diabetic retinopathy; NPDR non-proliferative diabetic retinopathy; PDR proliferative diabetic retinopathy; CSME clinically significant macular edema; DME diabetic macular edema; dbh dot blot hemorrhages; CWS cotton wool spot; POAG primary open angle glaucoma; C/D cup-to-disc ratio; HVF humphrey visual field; GVF goldmann visual field; OCT optical coherence tomography; IOP intraocular pressure; BRVO Branch retinal vein occlusion; CRVO central retinal vein occlusion; CRAO central retinal artery occlusion; BRAO branch retinal artery occlusion; RT retinal tear; SB scleral buckle; PPV pars plana vitrectomy; VH Vitreous hemorrhage; PRP panretinal laser photocoagulation; IVK intravitreal kenalog; VMT vitreomacular traction; MH Macular hole;  NVD neovascularization of the disc; NVE neovascularization elsewhere; AREDS age related eye disease study; ARMD age related macular degeneration; POAG primary open angle glaucoma; EBMD epithelial/anterior basement membrane dystrophy; ACIOL anterior chamber intraocular lens; IOL intraocular lens; PCIOL posterior chamber intraocular lens; Phaco/IOL phacoemulsification with intraocular lens placement; [place] photorefractive keratectomy; LASIK laser assisted in situ keratomileusis; HTN hypertension; DM diabetes mellitus; COPD chronic obstructive pulmonary disease

## 2021-07-12 ENCOUNTER — Ambulatory Visit (INDEPENDENT_AMBULATORY_CARE_PROVIDER_SITE_OTHER): Payer: No Typology Code available for payment source | Admitting: Ophthalmology

## 2021-07-12 ENCOUNTER — Other Ambulatory Visit: Payer: Self-pay

## 2021-07-12 ENCOUNTER — Encounter (INDEPENDENT_AMBULATORY_CARE_PROVIDER_SITE_OTHER): Payer: Self-pay | Admitting: Ophthalmology

## 2021-07-12 VITALS — BP 162/100 | HR 83

## 2021-07-12 DIAGNOSIS — I10 Essential (primary) hypertension: Secondary | ICD-10-CM

## 2021-07-12 DIAGNOSIS — H4311 Vitreous hemorrhage, right eye: Secondary | ICD-10-CM | POA: Diagnosis not present

## 2021-07-12 DIAGNOSIS — E113413 Type 2 diabetes mellitus with severe nonproliferative diabetic retinopathy with macular edema, bilateral: Secondary | ICD-10-CM | POA: Diagnosis not present

## 2021-07-12 DIAGNOSIS — H3582 Retinal ischemia: Secondary | ICD-10-CM

## 2021-07-12 DIAGNOSIS — H35033 Hypertensive retinopathy, bilateral: Secondary | ICD-10-CM | POA: Diagnosis not present

## 2021-07-12 DIAGNOSIS — H25813 Combined forms of age-related cataract, bilateral: Secondary | ICD-10-CM

## 2021-07-12 NOTE — Progress Notes (Signed)
This encounter was created in error - please disregard.

## 2021-07-24 ENCOUNTER — Encounter: Payer: No Typology Code available for payment source | Admitting: Student

## 2021-08-08 NOTE — Progress Notes (Shared)
[facility] - Clinic Note  08/09/2021     CHIEF COMPLAINT Patient presents for No chief complaint on file.   HISTORY OF PRESENT ILLNESS: [name] is a 56 y.o. male who presents to the clinic today for:     Referring physician: [facility], [facility] [address] [facility] [place],  [place] [address]  HISTORICAL INFORMATION:   Selected notes from the MEDICAL RECORD NUMBER Referred by [facility] for retinal hemes LEE:  Ocular Hx- PMH-    CURRENT MEDICATIONS: No current outpatient medications on file. (Ophthalmic Drugs)   No current facility-administered medications for this visit. (Ophthalmic Drugs)   Current Outpatient Medications (Other)  Medication Sig   amLODipine (NORVASC) 10 MG tablet Take 10 mg by mouth daily. (Patient not taking: Reported on 06/23/2021)   amoxicillin-clavulanate (AUGMENTIN) 875-125 MG tablet Take 1 tablet by mouth every 12 (twelve) hours. (Patient not taking: Reported on 06/23/2021)   clindamycin (CLEOCIN) 300 MG capsule Take 1 capsule (300 mg total) by mouth 4 (four) times daily. X 7 days (Patient not taking: Reported on 06/23/2021)   diphenhydrAMINE (BENADRYL) 25 MG tablet Take 1 tablet (25 mg total) by mouth every 6 (six) hours as needed for itching or allergies.   docusate sodium (COLACE) 100 MG capsule Take 1 capsule (100 mg total) by mouth every 12 (twelve) hours.   Dulaglutide (TRULICITY [place]) Inject into the skin.   escitalopram (LEXAPRO) 10 MG tablet Take 10 mg by mouth daily.   glipiZIDE (GLUCOTROL) 10 MG tablet Take 1 tablet (10 mg total) by mouth 2 (two) times daily before a meal.   HYDROCHLOROTHIAZIDE PO Take by mouth.   insulin aspart protamine- aspart (NOVOLOG MIX 70/30) (70-30) 100 UNIT/ML injection Inject into the skin. (Patient not taking: Reported on 06/23/2021)   lisinopril (PRINIVIL,ZESTRIL) 10 MG tablet Take 10 mg by mouth daily.   loratadine (CLARITIN) 10 MG tablet TAKE 1 TABLET BY MOUTH ONCE DAILY    losartan (COZAAR) 100 MG tablet Take 100 mg by mouth daily. (Patient not taking: Reported on 06/23/2021)   OMEPRAZOLE PO Take by mouth.   oxyCODONE-acetaminophen (PERCOCET/ROXICET) 5-325 MG per tablet Take 2 tablets by mouth every 4 (four) hours as needed for pain.   polyethylene glycol (MIRALAX / GLYCOLAX) 17 g packet Take 17 g by mouth daily.   SUMAtriptan (IMITREX) 50 MG tablet Take 50 mg by mouth every 2 (two) hours as needed for migraine or headache. May repeat in 2 hours if headache persists or recurs.   testosterone cypionate (DEPOTESTOTERONE CYPIONATE) 100 MG/ML injection Inject 100 mg into the muscle every 14 (fourteen) days. For IM use only   Zolpidem Tartrate (AMBIEN PO) Take by mouth.   No current facility-administered medications for this visit. (Other)   REVIEW OF SYSTEMS:    ALLERGIES No Known Allergies  PAST MEDICAL HISTORY Past Medical History:  Diagnosis Date   Arthritis    Diabetes mellitus    Fistula, perirectal    GERD (gastroesophageal reflux disease)    Hypertension    PTSD (post-traumatic stress disorder)    Small bowel obstruction (HCC)    Past Surgical History:  Procedure Laterality Date   RECTAL SURGERY      FAMILY HISTORY No family history on file.  SOCIAL HISTORY Social History   Tobacco Use   Smoking status: Some Days    Types: Cigars   Smokeless tobacco: Never  Vaping Use   Vaping Use: Never used  Substance Use Topics   Alcohol use: Not  Currently    Comment: occ   Drug use: No       OPHTHALMIC EXAM:  Not recorded     IMAGING AND PROCEDURES  Imaging and Procedures for 08/09/2021           ASSESSMENT/PLAN:  No diagnosis found.  1. Severe non-proliferative diabetic retinopathy, both eyes  - pt reports recent improvement in A1c from 11 down to 6 since starting Ozempic - exam shows scattered MA, DBH and CWS OU, mild VH OD and central cystic changes OS -- improving - FA (12.23.22) scattered MA OU, no NV OU - OCT shows  mild diabetic macular edema, both eyes, but improved from prior - recommend IVA #1 OD 12.23.22 for diabetic retinopathy and VH OD, but  pt was unable to be treated today due to elevated BP - today, no retinal or ophthalmic intervention recommended as hemorrages and edema have improved with improved BP and BG control - f/u in 4-6 wks -- DFE/OCT, possible injection  2,3. Hypertensive retinopathy w/ retinal ischemia OU  - BP uncontrolled -- 170s / 110-120s in office 12.23.22, today BP is 162/100 -- still uncontrolled  - pt was to f/u with PCP or go straight to Urgent Care following last visit, but did not do either  - FA 12.23.22 shows significant retinal ischemia OU -- enlarged FAZ OU (OD > OS) and significant patches of capillary drop out OU (OD>OS) - discussed importance of tight BP control and risk of MI and stroke w/ elevated BP - pt denies headache, chest pain, numbness/tingling but advised medical f/u - will refer to [facility]  4. Vitreous hemorrhage OD  - mild blood stained vit condensations OD  - secondary to DM and uncontrolled BP  - improving  5. Mixed Cataract OU - The symptoms of cataract, surgical options, and treatments and risks were discussed with patient. - discussed diagnosis and progression - approaching visual significance  - will refer to [facility] for cat eval and establishment of primary eye care  Ophthalmic Meds Ordered this visit:  No orders of the defined types were placed in this encounter.    No follow-ups on file.  There are no Patient Instructions on file for this visit.   Explained the diagnoses, plan, and follow up with the patient and they expressed understanding.  Patient expressed understanding of the importance of proper follow up care.   This document serves as a record of services personally performed by Karie Chimera, MD, PhD. It was created on their behalf by De Blanch, an ophthalmic technician. The  creation of this record is the provider's dictation and/or activities during the visit.    Electronically signed by: De Blanch, OA, 08/08/21  3:04 PM   Karie Chimera, M.D., Ph.D. Diseases & Surgery of the Retina and Vitreous [facility]  I have reviewed the above documentation for accuracy and completeness, and I agree with the above. Karie Chimera, M.D., Ph.D. 07/12/21 3:04 PM  Abbreviations: M myopia (nearsighted); A astigmatism; H hyperopia (farsighted); P presbyopia; Mrx spectacle prescription;  CTL contact lenses; OD right eye; OS left eye; OU both eyes  XT exotropia; ET esotropia; PEK punctate epithelial keratitis; PEE punctate epithelial erosions; DES dry eye syndrome; MGD meibomian gland dysfunction; ATs artificial tears; PFAT's preservative free artificial tears; [place] nuclear sclerotic cataract; PSC posterior subcapsular cataract; ERM epi-retinal membrane; PVD posterior vitreous detachment; RD retinal detachment; DM diabetes mellitus; DR diabetic retinopathy; NPDR non-proliferative  diabetic retinopathy; PDR proliferative diabetic retinopathy; CSME clinically significant macular edema; DME diabetic macular edema; dbh dot blot hemorrhages; CWS cotton wool spot; POAG primary open angle glaucoma; C/D cup-to-disc ratio; HVF humphrey visual field; GVF goldmann visual field; OCT optical coherence tomography; IOP intraocular pressure; BRVO Branch retinal vein occlusion; CRVO central retinal vein occlusion; CRAO central retinal artery occlusion; BRAO branch retinal artery occlusion; RT retinal tear; SB scleral buckle; PPV pars plana vitrectomy; VH Vitreous hemorrhage; PRP panretinal laser photocoagulation; IVK intravitreal kenalog; VMT vitreomacular traction; MH Macular hole;  NVD neovascularization of the disc; NVE neovascularization elsewhere; AREDS age related eye disease study; ARMD age related macular degeneration; POAG primary open angle glaucoma; EBMD  epithelial/anterior basement membrane dystrophy; ACIOL anterior chamber intraocular lens; IOL intraocular lens; PCIOL posterior chamber intraocular lens; Phaco/IOL phacoemulsification with intraocular lens placement; PRK photorefractive keratectomy; LASIK laser assisted in situ keratomileusis; HTN hypertension; DM diabetes mellitus; COPD chronic obstructive pulmonary disease

## 2021-08-09 ENCOUNTER — Encounter (INDEPENDENT_AMBULATORY_CARE_PROVIDER_SITE_OTHER): Payer: No Typology Code available for payment source | Admitting: Ophthalmology

## 2021-08-09 DIAGNOSIS — H35033 Hypertensive retinopathy, bilateral: Secondary | ICD-10-CM

## 2021-08-09 DIAGNOSIS — H4311 Vitreous hemorrhage, right eye: Secondary | ICD-10-CM

## 2021-08-09 DIAGNOSIS — E113413 Type 2 diabetes mellitus with severe nonproliferative diabetic retinopathy with macular edema, bilateral: Secondary | ICD-10-CM

## 2021-08-09 DIAGNOSIS — I10 Essential (primary) hypertension: Secondary | ICD-10-CM

## 2021-08-09 DIAGNOSIS — H25813 Combined forms of age-related cataract, bilateral: Secondary | ICD-10-CM

## 2021-08-28 NOTE — Progress Notes (Shared)
[facility] - Clinic Note  08/30/2021     CHIEF COMPLAINT Patient presents for No chief complaint on file.   HISTORY OF PRESENT ILLNESS: [name] is a 56 y.o. male who presents to the clinic today for:     Referring physician: Clinic, [facility] [address] [facility] [place],  [place] [address]  HISTORICAL INFORMATION:   Selected notes from the MEDICAL RECORD NUMBER Referred by [facility] for retinal hemes LEE:  Ocular Hx- PMH-    CURRENT MEDICATIONS: No current outpatient medications on file. (Ophthalmic Drugs)   No current facility-administered medications for this visit. (Ophthalmic Drugs)   Current Outpatient Medications (Other)  Medication Sig   amLODipine (NORVASC) 10 MG tablet Take 10 mg by mouth daily. (Patient not taking: Reported on 06/23/2021)   amoxicillin-clavulanate (AUGMENTIN) 875-125 MG tablet Take 1 tablet by mouth every 12 (twelve) hours. (Patient not taking: Reported on 06/23/2021)   clindamycin (CLEOCIN) 300 MG capsule Take 1 capsule (300 mg total) by mouth 4 (four) times daily. X 7 days (Patient not taking: Reported on 06/23/2021)   diphenhydrAMINE (BENADRYL) 25 MG tablet Take 1 tablet (25 mg total) by mouth every 6 (six) hours as needed for itching or allergies.   docusate sodium (COLACE) 100 MG capsule Take 1 capsule (100 mg total) by mouth every 12 (twelve) hours.   Dulaglutide (TRULICITY [place]) Inject into the skin.   escitalopram (LEXAPRO) 10 MG tablet Take 10 mg by mouth daily.   glipiZIDE (GLUCOTROL) 10 MG tablet Take 1 tablet (10 mg total) by mouth 2 (two) times daily before a meal.   HYDROCHLOROTHIAZIDE PO Take by mouth.   insulin aspart protamine- aspart (NOVOLOG MIX 70/30) (70-30) 100 UNIT/ML injection Inject into the skin. (Patient not taking: Reported on 06/23/2021)   lisinopril (PRINIVIL,ZESTRIL) 10 MG tablet Take 10 mg by mouth daily.   loratadine (CLARITIN) 10 MG tablet TAKE 1 TABLET BY MOUTH ONCE DAILY    losartan (COZAAR) 100 MG tablet Take 100 mg by mouth daily. (Patient not taking: Reported on 06/23/2021)   OMEPRAZOLE PO Take by mouth.   oxyCODONE-acetaminophen (PERCOCET/ROXICET) 5-325 MG per tablet Take 2 tablets by mouth every 4 (four) hours as needed for pain.   polyethylene glycol (MIRALAX / GLYCOLAX) 17 g packet Take 17 g by mouth daily.   SUMAtriptan (IMITREX) 50 MG tablet Take 50 mg by mouth every 2 (two) hours as needed for migraine or headache. May repeat in 2 hours if headache persists or recurs.   testosterone cypionate (DEPOTESTOTERONE CYPIONATE) 100 MG/ML injection Inject 100 mg into the muscle every 14 (fourteen) days. For IM use only   Zolpidem Tartrate (AMBIEN PO) Take by mouth.   No current facility-administered medications for this visit. (Other)   REVIEW OF SYSTEMS:    ALLERGIES No Known Allergies  PAST MEDICAL HISTORY Past Medical History:  Diagnosis Date   Arthritis    Diabetes mellitus    Fistula, perirectal    GERD (gastroesophageal reflux disease)    Hypertension    PTSD (post-traumatic stress disorder)    Small bowel obstruction (HCC)    Past Surgical History:  Procedure Laterality Date   RECTAL SURGERY      FAMILY HISTORY No family history on file.  SOCIAL HISTORY Social History   Tobacco Use   Smoking status: Some Days    Types: Cigars   Smokeless tobacco: Never  Vaping Use   Vaping Use: Never used  Substance Use Topics   Alcohol use: Not  Currently    Comment: occ   Drug use: No       OPHTHALMIC EXAM:  Not recorded     IMAGING AND PROCEDURES  Imaging and Procedures for 08/30/2021           ASSESSMENT/PLAN:  No diagnosis found.  1. Severe non-proliferative diabetic retinopathy, both eyes  - pt reports recent improvement in A1c from 11 down to 6 since starting Ozempic - exam shows scattered MA, DBH and CWS OU, mild VH OD and central cystic changes OS -- improving - FA (12.23.22) scattered MA OU, no NV OU - OCT shows  mild diabetic macular edema, both eyes, but improved from prior - recommend IVA #1 OD 12.23.22 for diabetic retinopathy and VH OD, but  pt was unable to be treated today due to elevated BP - today, no retinal or ophthalmic intervention recommended as hemorrages and edema have improved with improved BP and BG control - f/u in 4-6 wks -- DFE/OCT, possible injection  2,3. Hypertensive retinopathy w/ retinal ischemia OU  - BP uncontrolled -- 170s / 110-120s in office 12.23.22, today BP is 162/100 -- still uncontrolled  - pt was to f/u with PCP or go straight to Urgent Care following last visit, but did not do either  - FA 12.23.22 shows significant retinal ischemia OU -- enlarged FAZ OU (OD > OS) and significant patches of capillary drop out OU (OD>OS) - discussed importance of tight BP control and risk of MI and stroke w/ elevated BP - pt denies headache, chest pain, numbness/tingling but advised medical f/u - will refer to [facility]  4. Vitreous hemorrhage OD  - mild blood stained vit condensations OD  - secondary to DM and uncontrolled BP  - improving  5. Mixed Cataract OU - The symptoms of cataract, surgical options, and treatments and risks were discussed with patient. - discussed diagnosis and progression - approaching visual significance  - will refer to [facility] for cat eval and establishment of primary eye care  Ophthalmic Meds Ordered this visit:  No orders of the defined types were placed in this encounter.    No follow-ups on file.  There are no Patient Instructions on file for this visit.   Explained the diagnoses, plan, and follow up with the patient and they expressed understanding.  Patient expressed understanding of the importance of proper follow up care.   This document serves as a record of services personally performed by Karie Chimera, MD, PhD. It was created on their behalf by De Blanch, an ophthalmic technician. The  creation of this record is the provider's dictation and/or activities during the visit.    Electronically signed by: De Blanch, OA, 08/28/21  12:15 PM  This document serves as a record of services personally performed by Karie Chimera, MD, PhD. It was created on their behalf by Glee Arvin. Manson Passey, OA an ophthalmic technician. The creation of this record is the provider's dictation and/or activities during the visit.    Electronically signed by: Glee Arvin. Manson Passey, [place] 01.11.2023 12:15 PM  Karie Chimera, M.D., Ph.D. Diseases & Surgery of the Retina and Vitreous [facility]  I have reviewed the above documentation for accuracy and completeness, and I agree with the above. Karie Chimera, M.D., Ph.D. 07/12/21 12:15 PM  Abbreviations: M myopia (nearsighted); A astigmatism; H hyperopia (farsighted); P presbyopia; Mrx spectacle prescription;  CTL contact lenses; OD right eye; OS left eye; OU  both eyes  XT exotropia; ET esotropia; PEK punctate epithelial keratitis; PEE punctate epithelial erosions; DES dry eye syndrome; MGD meibomian gland dysfunction; ATs artificial tears; PFAT's preservative free artificial tears; NSC nuclear sclerotic cataract; PSC posterior subcapsular cataract; ERM epi-retinal membrane; PVD posterior vitreous detachment; RD retinal detachment; DM diabetes mellitus; DR diabetic retinopathy; NPDR non-proliferative diabetic retinopathy; PDR proliferative diabetic retinopathy; CSME clinically significant macular edema; DME diabetic macular edema; dbh dot blot hemorrhages; CWS cotton wool spot; POAG primary open angle glaucoma; C/D cup-to-disc ratio; HVF humphrey visual field; GVF goldmann visual field; OCT optical coherence tomography; IOP intraocular pressure; BRVO Branch retinal vein occlusion; CRVO central retinal vein occlusion; CRAO central retinal artery occlusion; BRAO branch retinal artery occlusion; RT retinal tear; SB scleral buckle; PPV pars plana  vitrectomy; VH Vitreous hemorrhage; PRP panretinal laser photocoagulation; IVK intravitreal kenalog; VMT vitreomacular traction; MH Macular hole;  NVD neovascularization of the disc; NVE neovascularization elsewhere; AREDS age related eye disease study; ARMD age related macular degeneration; POAG primary open angle glaucoma; EBMD epithelial/anterior basement membrane dystrophy; ACIOL anterior chamber intraocular lens; IOL intraocular lens; PCIOL posterior chamber intraocular lens; Phaco/IOL phacoemulsification with intraocular lens placement; PRK photorefractive keratectomy; LASIK laser assisted in situ keratomileusis; HTN hypertension; DM diabetes mellitus; COPD chronic obstructive pulmonary disease

## 2021-08-30 ENCOUNTER — Encounter (INDEPENDENT_AMBULATORY_CARE_PROVIDER_SITE_OTHER): Payer: No Typology Code available for payment source | Admitting: Ophthalmology

## 2021-09-20 ENCOUNTER — Encounter (INDEPENDENT_AMBULATORY_CARE_PROVIDER_SITE_OTHER): Payer: Self-pay | Admitting: Ophthalmology

## 2021-09-20 ENCOUNTER — Ambulatory Visit (INDEPENDENT_AMBULATORY_CARE_PROVIDER_SITE_OTHER): Payer: No Typology Code available for payment source | Admitting: Ophthalmology

## 2021-09-20 ENCOUNTER — Other Ambulatory Visit: Payer: Self-pay

## 2021-09-20 VITALS — BP 147/88 | HR 86

## 2021-09-20 DIAGNOSIS — E113413 Type 2 diabetes mellitus with severe nonproliferative diabetic retinopathy with macular edema, bilateral: Secondary | ICD-10-CM

## 2021-09-20 DIAGNOSIS — H25813 Combined forms of age-related cataract, bilateral: Secondary | ICD-10-CM | POA: Diagnosis not present

## 2021-09-20 DIAGNOSIS — H35033 Hypertensive retinopathy, bilateral: Secondary | ICD-10-CM | POA: Diagnosis not present

## 2021-09-20 DIAGNOSIS — I10 Essential (primary) hypertension: Secondary | ICD-10-CM

## 2021-09-20 MED ORDER — BEVACIZUMAB CHEMO INJECTION 1.25MG/0.05ML SYRINGE FOR KALEIDOSCOPE
1.2500 mg | INTRAVITREAL | Status: AC | PRN
  Administered 2021-09-20: 1.25 mg via INTRAVITREAL

## 2021-09-20 NOTE — Progress Notes (Signed)
?[facility] Note ? ?09/20/2021 ? ?  ? ?CHIEF COMPLAINT ?Patient presents for Retina Follow Up ? ? ?HISTORY OF PRESENT ILLNESS: ?[name] is a 56 y.o. male who presents to the clinic today for:  ? ?HPI   ? ? Retina Follow Up   ?Patient presents with  Diabetic Retinopathy.  In both eyes.  This started 2 months ago.  I, the attending physician,  performed the HPI with the patient and updated documentation appropriately. ? ?  ?  ? ? Comments   ?Patient here for retina follow up for severe NPDR OU. Patient states vision is up and down. Kinda worse. Couldn't see. No eye pain. Has bleeding behind eyes. ? ?  ?  ?Last edited by Bernarda Caffey, MD on 09/20/2021  9:31 AM.  ?  ? ?Pt lost to follow up from 01.11.23, he states he has come back bc both eyes are blurry, he states he has lost a lot of weight due to being on Ozempic, his blood sugar was 130 last time he checked it ? ?Referring physician: ?Clinic, [facility] [place] ?[facility] ?[place],  [place] [address] ? ?HISTORICAL INFORMATION:  ? ?Selected notes from the [place] ?Referred by [facility] for retinal hemes ?LEE:  ?Ocular Hx- ?PMH- ?  ? ?CURRENT MEDICATIONS: ?No current outpatient medications on file. (Ophthalmic Drugs)  ? ?No current facility-administered medications for this visit. (Ophthalmic Drugs)  ? ?Current Outpatient Medications (Other)  ?Medication Sig  ? diphenhydrAMINE (BENADRYL) 25 MG tablet Take 1 tablet (25 mg total) by mouth every 6 (six) hours as needed for itching or allergies.  ? docusate sodium (COLACE) 100 MG capsule Take 1 capsule (100 mg total) by mouth every 12 (twelve) hours.  ? Dulaglutide (TRULICITY [place]) Inject into the skin.  ? escitalopram (LEXAPRO) 10 MG tablet Take 10 mg by mouth daily.  ? HYDROCHLOROTHIAZIDE PO Take by mouth.  ? lisinopril (PRINIVIL,ZESTRIL) 10 MG tablet Take 10 mg by mouth daily.  ? OMEPRAZOLE PO Take by mouth.  ? oxyCODONE-acetaminophen (PERCOCET/ROXICET) 5-325 MG  per tablet Take 2 tablets by mouth every 4 (four) hours as needed for pain.  ? polyethylene glycol (MIRALAX / GLYCOLAX) 17 g packet Take 17 g by mouth daily.  ? SUMAtriptan (IMITREX) 50 MG tablet Take 50 mg by mouth every 2 (two) hours as needed for migraine or headache. May repeat in 2 hours if headache persists or recurs.  ? testosterone cypionate (DEPOTESTOTERONE CYPIONATE) 100 MG/ML injection Inject 100 mg into the muscle every 14 (fourteen) days. For IM use only  ? Zolpidem Tartrate (AMBIEN PO) Take by mouth.  ? amLODipine (NORVASC) 10 MG tablet Take 10 mg by mouth daily. (Patient not taking: Reported on 06/23/2021)  ? amoxicillin-clavulanate (AUGMENTIN) 875-125 MG tablet Take 1 tablet by mouth every 12 (twelve) hours. (Patient not taking: Reported on 06/23/2021)  ? clindamycin (CLEOCIN) 300 MG capsule Take 1 capsule (300 mg total) by mouth 4 (four) times daily. X 7 days (Patient not taking: Reported on 06/23/2021)  ? glipiZIDE (GLUCOTROL) 10 MG tablet Take 1 tablet (10 mg total) by mouth 2 (two) times daily before a meal.  ? insulin aspart protamine- aspart (NOVOLOG MIX 70/30) (70-30) 100 UNIT/ML injection Inject into the skin. (Patient not taking: Reported on 06/23/2021)  ? loratadine (CLARITIN) 10 MG tablet TAKE 1 TABLET BY MOUTH ONCE DAILY  ? losartan (COZAAR) 100 MG tablet Take 100 mg by mouth daily. (Patient not taking: Reported on 06/23/2021)  ? ?  No current facility-administered medications for this visit. (Other)  ? ?REVIEW OF SYSTEMS: ?ROS   ?Positive for: Endocrine, Eyes ?Negative for: Constitutional, Gastrointestinal, Neurological, Skin, Genitourinary, Musculoskeletal, HENT, Cardiovascular, Respiratory, Psychiatric, Allergic/Imm, Heme/Lymph ?Last edited by [name], COA on 09/20/2021  8:18 AM.  ?  ? ?ALLERGIES ?No Known Allergies ? ?PAST MEDICAL HISTORY ?Past Medical History:  ?Diagnosis Date  ? Arthritis   ? Diabetes mellitus   ? Fistula, perirectal   ? GERD (gastroesophageal reflux disease)    ? Hypertension   ? PTSD (post-traumatic stress disorder)   ? Small bowel obstruction ([place])   ? ?Past Surgical History:  ?Procedure Laterality Date  ? RECTAL SURGERY    ? ?FAMILY HISTORY ?History reviewed. No pertinent family history. ? ?SOCIAL HISTORY ?Social History  ? ?Tobacco Use  ? Smoking status: Some Days  ?  Types: Cigars  ? Smokeless tobacco: Never  ?Vaping Use  ? Vaping Use: Never used  ?Substance Use Topics  ? Alcohol use: Not Currently  ?  Comment: occ  ? Drug use: No  ?  ? ?  ?OPHTHALMIC EXAM: ? ?Base Eye Exam   ? ? Visual Acuity (Snellen - Linear)   ? ?   Right Left  ? Dist cc 20/100 20/50  ? Dist ph cc NI NI  ? ? Correction: Glasses  ?Looking to the side. ? ?  ?  ? ? Tonometry (Tonopen, 8:14 AM)   ? ?   Right Left  ? Pressure 17 14  ? ?  ?  ? ? Pupils   ? ?   Dark Light Shape React APD  ? Right 3 2 Round Minimal None  ? Left 3 2 Round Minimal None  ? ?  ?  ? ? Visual Fields (Counting fingers)   ? ?   Left Right  ?  Full Full  ? ?  ?  ? ? Extraocular Movement   ? ?   Right Left  ?  Full, Ortho Full, Ortho  ? ?  ?  ? ? Neuro/Psych   ? ? Oriented x3: Yes  ? Mood/Affect: Normal  ? ?  ?  ? ? Dilation   ? ? Both eyes: 1.0% Mydriacyl, 2.5% Phenylephrine @ 8:13 AM  ? ?  ?  ? ?  ? ?Slit Lamp and Fundus Exam   ? ? Slit Lamp Exam   ? ?   Right Left  ? Lids/Lashes Dermatochalasis - upper lid Dermatochalasis - upper lid  ? Conjunctiva/Sclera mild melanosis, nasal pingeucula mild melanosis, nasal and temporal pinguecula  ? Cornea arcus, trace PEE, trace tear film debris arcus, trace PEE, mild tear film debris  ? Anterior Chamber deep, clear, narrow temporal angle Deep and quiet  ? Iris Round and dilated, No NVI Round and dilated, No NVI  ? Lens 2+ Nuclear sclerosis, 2+ Cortical cataract 2-3+ Nuclear sclerosis, 2-3+ Cortical cataract  ? Anterior Vitreous Vitreous syneresis, mild blood stained vitreous condensations - improved Vitreous syneresis  ? ?  ?  ? ? Fundus Exam   ? ?   Right Left  ? Disc Pink and Sharp, no  NVD Pink and Sharp, no NVD  ? C/D Ratio 0.3 0.2  ? Macula Flat, good foveal reflex, scattered MA/DBH, CWS SN mac Blunted foveal reflex, central edema with cluster of DBH temporal fovea, scattered DBH and CWS IT mac  ? Vessels attenuated, Tortuous, mild Copper wiring attenuated, Tortuous, mild Copper wiring  ? Periphery Attached, scattered DBH and  CWS posteriorly Attached, scattered DBH and CWS posteriorly  ? ?  ?  ? ?  ? ?Refraction   ? ? Wearing Rx   ? ?   Sphere Cylinder Axis Add  ? Right +2.25 +0.50 165 +2.00  ? Left +2.25 +0.75 172 +2.00  ? ?  ?  ? ?  ? ? ?IMAGING AND PROCEDURES  ?Imaging and Procedures for 09/20/2021 ? ?OCT, Retina - OU - Both Eyes   ? ?   ?Right Eye ?Quality was good. Central Foveal Thickness: 245. Progression has been stable. Findings include normal foveal contour, no IRF, no SRF, vitreomacular adhesion (Stable improvement in vitreous opacities, irregular lamination, scattered IRHM and trace cystic changes).  ? ?Left Eye ?Quality was good. Central Foveal Thickness: 347. Progression has worsened. Findings include abnormal foveal contour, intraretinal hyper-reflective material, no SRF, intraretinal fluid (Interval increase in IRF/IRHM temporal fovea and macula).  ? ?Notes ?*Images captured and stored on drive ? ?Diagnosis / Impression:  ?OD: Stable improvement in vitreous opacities, irregular lamination, scattered IRHM and trace cystic changes, mild ellipsoid thinning centrally ?OS: Interval increase in IRF/IRHM temporal fovea and macula ? ?Clinical management:  ?See below ? ?Abbreviations: NFP - Normal foveal profile. CME - cystoid macular edema. PED - pigment epithelial detachment. IRF - intraretinal fluid. SRF - subretinal fluid. EZ - ellipsoid zone. ERM - epiretinal membrane. ORA - outer retinal atrophy. ORT - outer retinal tubulation. SRHM - subretinal hyper-reflective material. IRHM - intraretinal hyper-reflective material ? ? ?  ? ?Intravitreal Injection, Pharmacologic Agent - OS - Left  Eye   ? ?   ?Time Out ?09/20/2021. 8:58 AM. Confirmed correct patient, procedure, site, and patient consented.  ? ?Anesthesia ?Topical anesthesia was used. Anesthetic medications included Lidocaine 2%, P

## 2021-10-12 NOTE — Progress Notes (Shared)
?[facility] Note ? ?10/18/2021 ? ?  ? ?CHIEF COMPLAINT ?Patient presents for No chief complaint on file. ? ? ?HISTORY OF PRESENT ILLNESS: ?[name] is a 56 y.o. male who presents to the clinic today for:  ? ? ? ? ?Referring physician: ?Cl[facility], [facility] ?[facility] ?[place],  [place] [address] ? ?HISTORICAL INFORMATION:  ? ?Selected notes from the [place] ?Referred by [facility] for retinal hemes ?LEE:  ?Ocular Hx- ?PMH- ?  ? ?CURRENT MEDICATIONS: ?No current outpatient medications on file. (Ophthalmic Drugs)  ? ?No current facility-administered medications for this visit. (Ophthalmic Drugs)  ? ?Current Outpatient Medications (Other)  ?Medication Sig  ? amLODipine (NORVASC) 10 MG tablet Take 10 mg by mouth daily. (Patient not taking: Reported on 06/23/2021)  ? amoxicillin-clavulanate (AUGMENTIN) 875-125 MG tablet Take 1 tablet by mouth every 12 (twelve) hours. (Patient not taking: Reported on 06/23/2021)  ? clindamycin (CLEOCIN) 300 MG capsule Take 1 capsule (300 mg total) by mouth 4 (four) times daily. X 7 days (Patient not taking: Reported on 06/23/2021)  ? diphenhydrAMINE (BENADRYL) 25 MG tablet Take 1 tablet (25 mg total) by mouth every 6 (six) hours as needed for itching or allergies.  ? docusate sodium (COLACE) 100 MG capsule Take 1 capsule (100 mg total) by mouth every 12 (twelve) hours.  ? Dulaglutide (TRULICITY [place]) Inject into the skin.  ? escitalopram (LEXAPRO) 10 MG tablet Take 10 mg by mouth daily.  ? glipiZIDE (GLUCOTROL) 10 MG tablet Take 1 tablet (10 mg total) by mouth 2 (two) times daily before a meal.  ? HYDROCHLOROTHIAZIDE PO Take by mouth.  ? insulin aspart protamine- aspart (NOVOLOG MIX 70/30) (70-30) 100 UNIT/ML injection Inject into the skin. (Patient not taking: Reported on 06/23/2021)  ? lisinopril (PRINIVIL,ZESTRIL) 10 MG tablet Take 10 mg by mouth daily.  ? loratadine (CLARITIN) 10 MG tablet TAKE 1 TABLET BY MOUTH ONCE DAILY  ?  losartan (COZAAR) 100 MG tablet Take 100 mg by mouth daily. (Patient not taking: Reported on 06/23/2021)  ? OMEPRAZOLE PO Take by mouth.  ? oxyCODONE-acetaminophen (PERCOCET/ROXICET) 5-325 MG per tablet Take 2 tablets by mouth every 4 (four) hours as needed for pain.  ? polyethylene glycol (MIRALAX / GLYCOLAX) 17 g packet Take 17 g by mouth daily.  ? SUMAtriptan (IMITREX) 50 MG tablet Take 50 mg by mouth every 2 (two) hours as needed for migraine or headache. May repeat in 2 hours if headache persists or recurs.  ? testosterone cypionate (DEPOTESTOTERONE CYPIONATE) 100 MG/ML injection Inject 100 mg into the muscle every 14 (fourteen) days. For IM use only  ? Zolpidem Tartrate (AMBIEN PO) Take by mouth.  ? ?No current facility-administered medications for this visit. (Other)  ? ?REVIEW OF SYSTEMS: ? ? ?ALLERGIES ?No Known Allergies ? ?PAST MEDICAL HISTORY ?Past Medical History:  ?Diagnosis Date  ? Arthritis   ? Diabetes mellitus   ? Fistula, perirectal   ? GERD (gastroesophageal reflux disease)   ? Hypertension   ? PTSD (post-traumatic stress disorder)   ? Small bowel obstruction ([place])   ? ?Past Surgical History:  ?Procedure Laterality Date  ? RECTAL SURGERY    ? ?FAMILY HISTORY ?No family history on file. ? ?SOCIAL HISTORY ?Social History  ? ?Tobacco Use  ? Smoking status: Some Days  ?  Types: Cigars  ? Smokeless tobacco: Never  ?Vaping Use  ? Vaping Use: Never used  ?Substance Use Topics  ? Alcohol use: Not Currently  ?  Comment: occ  ? Drug use: No  ?  ? ?  ?OPHTHALMIC EXAM: ? ?Not recorded ?  ? ? ?IMAGING AND PROCEDURES  ?Imaging and Procedures for 10/18/2021 ? ? ?  ?  ? ?  ?ASSESSMENT/PLAN: ? ?No diagnosis found. ? ?1. Severe non-proliferative diabetic retinopathy, both eyes ? - pt lost to follow up from 01.11.23 to 03.22.23 -- family  ? - pt reports recent improvement in A1c from 11 down to 6 since starting Ozempic ?- exam shows scattered MA, DBH and CWS OU, mild VH OD and central cystic changes OS --  improving ?- FA (12.23.22) scattered MA OU, no NV OU ?- OCT shows OD: Stable improvement in vitreous opacities, irregular lamination, scattered IRHM and trace cystic changes, mild ellipsoid thinning centrally; OS: Interval increase in IRF/IRHM temporal fovea and macula ?- s/p IVA OS #1 (03.22.23) ?- recommend IVA OS #2 today, 04.19.23 ?- pt wishes to proceed ?- RBA of procedure discussed, questions answered ?- informed consent obtained and signed ?- see procedure note ?- f/u in 4 wks -- DFE/OCT, possible injection ? ?2,3. Hypertensive retinopathy w/ retinal ischemia OU ? - BP uncontrolled at initial consult -- 170s / 110-120s in office 12.23.22, today BP is significantly improved to 147/80 ? - FA 12.23.22 shows significant retinal ischemia OU -- enlarged FAZ OU (OD > OS) and significant patches of capillary drop out OU (OD>OS) ?- discussed importance of tight BP control and risk of MI and stroke w/ elevated BP ? ?4. Mixed Cataract OU ?- The symptoms of cataract, surgical options, and treatments and risks were discussed with patient. ?- discussed diagnosis and progression ?- approaching visual significance  ?- referred to [facility] for cat eval and establishment of primary eye care ? ?Ophthalmic Meds Ordered this visit:  ?No orders of the defined types were placed in this encounter. ?  ? ?No follow-ups on file. ? ?There are no Patient Instructions on file for this visit. ? ? ?Explained the diagnoses, plan, and follow up with the patient and they expressed understanding.  Patient expressed understanding of the importance of proper follow up care.  ? ?This document serves as a record of services personally performed by Gardiner Sleeper, MD, PhD. It was created on their behalf by Orvan Falconer, an ophthalmic technician. The creation of this record is the provider's dictation and/or activities during the visit.   ? ?Electronically signed by: Orvan Falconer, OA, 10/12/21  1:01 PM ? ? ? ?Gardiner Sleeper, M.D.,  Ph.D. ?Diseases & Surgery of the Retina and Vitreous ?[place] ? ?I have reviewed the above documentation for accuracy and completeness, and I agree with the above. Gardiner Sleeper, M.D., Ph.D. 09/20/21 1:01 PM ? ? ?Abbreviations: ?M myopia (nearsighted); A astigmatism; H hyperopia (farsighted); P presbyopia; Mrx spectacle prescription;  CTL contact lenses; OD right eye; OS left eye; OU both eyes  XT exotropia; ET esotropia; PEK punctate epithelial keratitis; PEE punctate epithelial erosions; DES dry eye syndrome; MGD meibomian gland dysfunction; ATs artificial tears; PFAT's preservative free artificial tears; [place] nuclear sclerotic cataract; PSC posterior subcapsular cataract; ERM epi-retinal membrane; PVD posterior vitreous detachment; RD retinal detachment; DM diabetes mellitus; DR diabetic retinopathy; NPDR non-proliferative diabetic retinopathy; PDR proliferative diabetic retinopathy; CSME clinically significant macular edema; DME diabetic macular edema; dbh dot blot hemorrhages; CWS cotton wool spot; POAG primary open angle glaucoma; C/D cup-to-disc ratio; HVF humphrey visual field; GVF goldmann visual field; OCT optical coherence tomography; IOP intraocular pressure;  BRVO Branch retinal vein occlusion; CRVO central retinal vein occlusion; CRAO central retinal artery occlusion; BRAO branch retinal artery occlusion; RT retinal tear; SB scleral buckle; PPV pars plana vitrectomy; VH Vitreous hemorrhage; PRP panretinal laser photocoagulation; IVK intravitreal kenalog; VMT vitreomacular traction; MH Macular hole;  NVD neovascularization of the disc; NVE neovascularization elsewhere; AREDS age related eye disease study; ARMD age related macular degeneration; POAG primary open angle glaucoma; EBMD epithelial/anterior basement membrane dystrophy; ACIOL anterior chamber intraocular lens; IOL intraocular lens; PCIOL posterior chamber intraocular lens; Phaco/IOL phacoemulsification with  intraocular lens placement; [place] photorefractive keratectomy; LASIK laser assisted in situ keratomileusis; HTN hypertension; DM diabetes mellitus; COPD chronic obstructive pulmonary disease ? ?

## 2021-10-14 ENCOUNTER — Other Ambulatory Visit: Payer: Self-pay

## 2021-10-14 ENCOUNTER — Emergency Department (HOSPITAL_BASED_OUTPATIENT_CLINIC_OR_DEPARTMENT_OTHER)
Admission: EM | Admit: 2021-10-14 | Discharge: 2021-10-14 | Disposition: A | Discharge status: Home / Self Care | Payer: No Typology Code available for payment source | Attending: Emergency Medicine | Admitting: Emergency Medicine

## 2021-10-14 ENCOUNTER — Emergency Department (HOSPITAL_BASED_OUTPATIENT_CLINIC_OR_DEPARTMENT_OTHER): Payer: No Typology Code available for payment source

## 2021-10-14 ENCOUNTER — Encounter (HOSPITAL_BASED_OUTPATIENT_CLINIC_OR_DEPARTMENT_OTHER): Payer: Self-pay

## 2021-10-14 DIAGNOSIS — E119 Type 2 diabetes mellitus without complications: Secondary | ICD-10-CM | POA: Diagnosis not present

## 2021-10-14 DIAGNOSIS — Z7984 Long term (current) use of oral hypoglycemic drugs: Secondary | ICD-10-CM | POA: Insufficient documentation

## 2021-10-14 DIAGNOSIS — M25512 Pain in left shoulder: Secondary | ICD-10-CM | POA: Diagnosis not present

## 2021-10-14 DIAGNOSIS — Y92410 Unspecified street and highway as the place of occurrence of the external cause: Secondary | ICD-10-CM | POA: Insufficient documentation

## 2021-10-14 DIAGNOSIS — Z794 Long term (current) use of insulin: Secondary | ICD-10-CM | POA: Insufficient documentation

## 2021-10-14 DIAGNOSIS — L03031 Cellulitis of right toe: Secondary | ICD-10-CM | POA: Diagnosis present

## 2021-10-14 DIAGNOSIS — L089 Local infection of the skin and subcutaneous tissue, unspecified: Secondary | ICD-10-CM

## 2021-10-14 MED ORDER — CLINDAMYCIN HCL 300 MG PO CAPS: 300.0000 mg | ORAL_CAPSULE | Freq: Four times a day (QID) | ORAL | 0 refills | Status: DC

## 2021-10-14 MED ORDER — HYDROMORPHONE HCL 1 MG/ML IJ SOLN
1.0000 mg | Freq: Once | INTRAMUSCULAR | Status: AC
  Administered 2021-10-14: 1 mg via INTRAMUSCULAR
  Filled 2021-10-14: qty 1
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
## 2021-10-14 NOTE — Discharge Instructions (Addendum)
Return if any problems.  See your Physician for recheck of toe wound on Monday ?

## 2021-10-14 NOTE — ED Notes (Signed)
Patients right toe was wrapped with dry guaze.  ?
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
## 2021-10-17 NOTE — ED Provider Notes (Signed)
Formatting of this note is different from the original.  Images from the original note were not included.  History  Chief Complaint   Patient presents with    Toe Pain     HPI  56 y/o M with h/o DM presents to the ED for evaluation of R great toe wound. Pt is visiting locally from [place] (here for a funeral) when PCP called him and referred him to ED. Pt had an MRI of R foot yesterday that showed osteomyelitis of toe. Pt has been taking Clindamycin 368m QID. [place] two days from now. Pt denies fever, chills, or other sx's at this time.     No past medical history on file.    No past surgical history on file.    No family history on file.        Review of Systems   Constitutional:  Negative for diaphoresis and fever.   HENT:  Negative for congestion, sore throat and trouble swallowing.    Eyes:  Negative for photophobia, discharge and visual disturbance.   Respiratory:  Negative for cough and shortness of breath.    Cardiovascular:  Negative for chest pain and palpitations.   Gastrointestinal:  Negative for abdominal distention, abdominal pain, diarrhea and vomiting.   Endocrine: Negative for polydipsia and polyuria.   Genitourinary:  Negative for dysuria, flank pain and hematuria.   Musculoskeletal:  Negative for joint swelling and neck stiffness.   Skin:  Negative for color change and rash.   Neurological:  Negative for syncope and weakness.   Psychiatric/Behavioral:  Negative for confusion, hallucinations and suicidal ideas.      Physical Exam  BP (!) 175/88 (BP Location: Left arm, Patient Position: Sitting)   Pulse 88   Temp 97.9 F (36.6 C) (Oral)   Resp 16   Ht 175.3 cm (69")   Wt 85.3 kg (188 lb)   SpO2 100%   BMI 27.76 kg/m     Physical Exam  Vitals and nursing note reviewed.   Constitutional:       Appearance: He is well-developed. He is not diaphoretic.   HENT:      Head: Normocephalic and atraumatic.      Nose: No congestion.      Mouth/Throat:       Mouth: Mucous membranes are moist.   Eyes:      Conjunctiva/sclera: Conjunctivae normal.      Pupils: Pupils are equal, round, and reactive to light.   Cardiovascular:      Rate and Rhythm: Normal rate and regular rhythm.      Heart sounds: No murmur heard.  Pulmonary:      Effort: Pulmonary effort is normal.      Breath sounds: Normal breath sounds.   Abdominal:      General: There is no distension.      Palpations: Abdomen is soft.      Tenderness: There is no abdominal tenderness.   Musculoskeletal:         General: No deformity. Normal range of motion.      Cervical back: Normal range of motion and neck supple.      Comments: Medial aspect of R great toe with large, deep ulcer, no purulent d/c, mild edema that extends to mid foot, 2+ DP pulse. Decreased sensation to foot   Skin:     General: Skin is warm and dry.      Findings: No rash.  Neurological:      Mental Status: He is alert and oriented to person, place, and time.   Psychiatric:         Behavior: Behavior normal.       Recent Labs       Procedure Component Value Units Date/Time    Anaerobic Culture [[id number]] Collected: 10/17/21 1810    Specimen: Tissue from Toe, Right Updated: 10/17/21 1815    Anaerobic and Aerobic Culture (Includes Gram Stain) [[id number]] Collected: 10/17/21 1810    Specimen: Tissue from Toe, Right Updated: 10/17/21 1815    Narrative:      The following orders were created for panel order Anaerobic and Aerobic Culture (Includes Gram Stain).  Procedure                               Abnormality         Status                     ---------                               -----------         ------                     Anaerobic Culture[[id number]]                                In process                 Aerobic Culture (Include.Marland KitchenMarland Kitchen[[id number]]                      In process                   Please view results for these tests on the individual orders.    Aerobic Culture (Includes Gram Stain) [[id number]] Collected: 10/17/21 1810     Specimen: Tissue from Toe, Right Updated: 10/17/21 4034    Basic Metabolic Panel [[id number]]  (Abnormal) Collected: 10/17/21 1541    Specimen: Blood Updated: 10/17/21 1608     Sodium 131 mmol/L      Potassium 3.9 mmol/L      Chloride 98 mmol/L      CO2 25 mmol/L      BUN 10 mg/dL      Calcium 9.2 mg/dL      Creatinine 1.03 mg/dL      Glucose 399 mg/dL      Anion Gap 8 mmol/L      eGFR 86 mL/min/1.73 m2     Narrative:      Fasting Glucose:  Impaired Fasting 100 - 125 mg/dL  Diagnostic of Diabetes Mellitus >/= 126 mg/dL    Random Glucose:  Reference: 70 - 139 mg/dL  Diagnostic of Diabetes Mellitus >/= 200 mg/dL    C-Reactive Protein, Inflammation [[id number]]  (Abnormal) Collected: 10/17/21 1541    Specimen: Blood Updated: 10/17/21 1608     CRP, Inflammatory 23.3 mg/L     Sedimentation Rate [[id number]]  (Abnormal) Collected: 10/17/21 1541    Specimen: Whole Blood Updated: 10/17/21 1604     Sed Rate (ESR) 29 mm/hr     CBC w/Differential [[id number]]  (Abnormal) Collected: 10/17/21 1541    Specimen: Whole Blood Updated: 10/17/21 1552  WBC 6.1 K/uL      RBC 4.88 M/uL      Hemoglobin 14.9 g/dL      Hematocrit 43.4 %      MCV 88.9 fL      MCH 30.5 pg      MCHC 34.3 g/dL      RDW-CV 13.5 %      Platelets 282 K/uL      MPV 6.6 fL      Neutrophils, % 53.2 %      Lymphocytes, % 35.5 %      Monocytes, % 8.4 %      Eosinophils % 1.8 %      Basophils % 1.1 %      Neutrophils, Abs 3.20 K/uL      Lymphocytes, Abs 2.20 K/uL      Monocytes, Abs 0.50 K/uL      Eosinophils, Abs 0.10 K/uL      Basophils, Abs 0.10 K/uL          ED Course  DDx: cellulitis, osteomyelitis, sepsis    6:21 PM Pt will return to [place] and see wound care MD on Thursday as planned. Culture of wound obtained prior to d/c, ABX changed to Augmentin. Results printed, pt will take to wound care appt.     DX:  1. Osteomyelitis of great toe of right foot [facility])        Discharge Medication List       New Medications      amoxicillin-clavulanate 875-125 mg per tablet  Sig:  Take 1 tablet by mouth every 12 (twelve) hours  Qty: 14 tablet  Refills: 0  Commonly known as: AUGMENTIN          Patient understands evaluation of their condition in ED.  Instructions given for when to return to ED and the patient voiced understanding.   Reviewed warning signs and symptoms to return to ED.   All questions answered and agrees with plan for discharge and follow up.      Electronically signed by Aline August, MD at 10/18/2021  8:06 AM EDT    Associated attestation - Aline August, MD - 10/18/2021  8:06 AM EDT  Formatting of this note might be different from the original.  Aline August, MD

## 2021-10-17 NOTE — ED Triage Notes (Signed)
Formatting of this note might be different from the original.  Pt ambulatory into triage. Pt states infection in right big toe has been happening for 2 years. Pt states his Dr. instructed him to come to the ER because his right big toe is progressively getting worse. Pt states his Dr. stated he as osteomyelitis. Pt states pain is ongoing.   Electronically signed by Theodoro Doing, RN at 10/17/2021  2:25 PM EDT

## 2021-10-17 NOTE — Progress Notes (Signed)
?[facility] Note ? ?10/20/2021 ? ?  ? ?CHIEF COMPLAINT ?Patient presents for Retina Follow Up ? ? ?HISTORY OF PRESENT ILLNESS: ?[name] is a 56 y.o. male who presents to the clinic today for:  ? ?HPI   ? ? Retina Follow Up   ?Patient presents with  Diabetic Retinopathy.  In both eyes.  Severity is moderate.  Duration of 4 weeks.  Since onset it is gradually worsening.  I, the attending physician,  performed the HPI with the patient and updated documentation appropriately. ? ?  ?  ? ? Comments   ?Patient states vision blurry OU. Patient had infection in bone in toe. Avoiding removal as of right now. Last a1c was 9.0.  ? ?  ?  ?Last edited by Bernarda Caffey, MD on 10/21/2021  2:11 AM.  ?  ?Pt states on Monday, he had an MRI on his toe and got a phone call on yesterday that he has an infection in his toe and needed to get to the ED ASAP, pt states he went to the ED in [place], [place] and they increased his antibiotics which has helped, pt states his vision improved for about 2 weeks after his first injection, but then went back to his baseline, his last A1c was 9.0 and he gets new labs drawn next week ? ? ?Referring physician: ?Clinic, [facility] ?[facility] ?[place],  [place] [address] ? ?HISTORICAL INFORMATION:  ? ?Selected notes from the [place] ?Referred by [facility] for retinal hemes ?LEE:  ?Ocular Hx- ?PMH- ?  ? ?CURRENT MEDICATIONS: ?No current outpatient medications on file. (Ophthalmic Drugs)  ? ?No current facility-administered medications for this visit. (Ophthalmic Drugs)  ? ?Current Outpatient Medications (Other)  ?Medication Sig  ? clindamycin (CLEOCIN) 300 MG capsule Take 1 capsule (300 mg total) by mouth 4 (four) times daily. X 7 days  ? diphenhydrAMINE (BENADRYL) 25 MG tablet Take 1 tablet (25 mg total) by mouth every 6 (six) hours as needed for itching or allergies.  ? docusate sodium (COLACE) 100 MG capsule Take 1 capsule (100 mg total) by  mouth every 12 (twelve) hours.  ? Dulaglutide (TRULICITY [place]) Inject into the skin.  ? escitalopram (LEXAPRO) 10 MG tablet Take 10 mg by mouth daily.  ? HYDROCHLOROTHIAZIDE PO Take by mouth.  ? lisinopril (PRINIVIL,ZESTRIL) 10 MG tablet Take 10 mg by mouth daily.  ? OMEPRAZOLE PO Take by mouth.  ? oxyCODONE-acetaminophen (PERCOCET/ROXICET) 5-325 MG per tablet Take 2 tablets by mouth every 4 (four) hours as needed for pain.  ? polyethylene glycol (MIRALAX / GLYCOLAX) 17 g packet Take 17 g by mouth daily.  ? SUMAtriptan (IMITREX) 50 MG tablet Take 50 mg by mouth every 2 (two) hours as needed for migraine or headache. May repeat in 2 hours if headache persists or recurs.  ? testosterone cypionate (DEPOTESTOTERONE CYPIONATE) 100 MG/ML injection Inject 100 mg into the muscle every 14 (fourteen) days. For IM use only  ? Zolpidem Tartrate (AMBIEN PO) Take by mouth.  ? amLODipine (NORVASC) 10 MG tablet Take 10 mg by mouth daily. (Patient not taking: Reported on 06/23/2021)  ? amoxicillin-clavulanate (AUGMENTIN) 875-125 MG tablet Take 1 tablet by mouth every 12 (twelve) hours. (Patient not taking: Reported on 06/23/2021)  ? glipiZIDE (GLUCOTROL) 10 MG tablet Take 1 tablet (10 mg total) by mouth 2 (two) times daily before a meal.  ? insulin aspart protamine- aspart (NOVOLOG MIX 70/30) (70-30) 100 UNIT/ML injection Inject  into the skin. (Patient not taking: Reported on 06/23/2021)  ? loratadine (CLARITIN) 10 MG tablet TAKE 1 TABLET BY MOUTH ONCE DAILY  ? losartan (COZAAR) 100 MG tablet Take 100 mg by mouth daily. (Patient not taking: Reported on 06/23/2021)  ? ?No current facility-administered medications for this visit. (Other)  ? ?REVIEW OF SYSTEMS: ?ROS   ?Positive for: Endocrine, Eyes ?Negative for: Constitutional, Gastrointestinal, Neurological, Skin, Genitourinary, Musculoskeletal, HENT, Cardiovascular, Respiratory, Psychiatric, Allergic/Imm, Heme/Lymph ?Last edited by Jobe Marker, COT on 10/20/2021  8:38 AM.  ?   ? ? ?ALLERGIES ?No Known Allergies ? ?PAST MEDICAL HISTORY ?Past Medical History:  ?Diagnosis Date  ? Arthritis   ? Diabetes mellitus   ? Fistula, perirectal   ? GERD (gastroesophageal reflux disease)   ? Hypertension   ? PTSD (post-traumatic stress disorder)   ? Small bowel obstruction ([place])   ? ?Past Surgical History:  ?Procedure Laterality Date  ? RECTAL SURGERY    ? ?FAMILY HISTORY ?History reviewed. No pertinent family history. ? ?SOCIAL HISTORY ?Social History  ? ?Tobacco Use  ? Smoking status: Some Days  ?  Types: Cigars  ? Smokeless tobacco: Never  ?Vaping Use  ? Vaping Use: Never used  ?Substance Use Topics  ? Alcohol use: Not Currently  ?  Comment: occ  ? Drug use: No  ?  ? ?  ?OPHTHALMIC EXAM: ? ?Base Eye Exam   ? ? Visual Acuity (Snellen - Linear)   ? ?   Right Left  ? Dist cc 20/100 -2 20/40 -2  ? Dist ph cc NI 20/30 -2  ? ? Correction: Glasses  ? ?  ?  ? ? Tonometry (Tonopen, 8:46 AM)   ? ?   Right Left  ? Pressure 10 08  ? ?  ?  ? ? Pupils   ? ?   Dark Light Shape React APD  ? Right 3 2 Round Minimal None  ? Left 3 2 Round Minimal None  ? ?  ?  ? ? Visual Fields (Counting fingers)   ? ?   Left Right  ?  Full Full  ? ?  ?  ? ? Extraocular Movement   ? ?   Right Left  ?  Full, Ortho Full, Ortho  ? ?  ?  ? ? Neuro/Psych   ? ? Oriented x3: Yes  ? Mood/Affect: Normal  ? ?  ?  ? ? Dilation   ? ? Both eyes: 1.0% Mydriacyl, 2.5% Phenylephrine @ 8:46 AM  ? ?  ?  ? ?  ? ?Slit Lamp and Fundus Exam   ? ? Slit Lamp Exam   ? ?   Right Left  ? Lids/Lashes Dermatochalasis - upper lid Dermatochalasis - upper lid  ? Conjunctiva/Sclera mild melanosis, nasal pingeucula mild melanosis, nasal and temporal pinguecula  ? Cornea arcus, trace PEE, trace tear film debris arcus, trace PEE, mild tear film debris  ? Anterior Chamber deep, clear, narrow temporal angle Deep and quiet  ? Iris Round and dilated, No NVI Round and dilated, No NVI  ? Lens 2+ Nuclear sclerosis, 2+ Cortical cataract 2-3+ Nuclear sclerosis, 2-3+ Cortical  cataract  ? Anterior Vitreous Vitreous syneresis, mild blood stained vitreous condensations - improved Vitreous syneresis  ? ?  ?  ? ? Fundus Exam   ? ?   Right Left  ? Disc Pink and Sharp, no NVD Pink and Sharp, no NVD  ? C/D Ratio 0.3 0.2  ? Macula Flat, good  foveal reflex, scattered MA/DBH, CWS SN / IT mac Blunted foveal reflex, central edema with cluster of DBH temporal fovea - improved, scattered DBH and CWS IT mac  ? Vessels attenuated, Tortuous, mild Copper wiring attenuated, Tortuous, mild Copper wiring  ? Periphery Attached, scattered DBH and CWS posteriorly Attached, scattered DBH and CWS posteriorly  ? ?  ?  ? ?  ? ?Refraction   ? ? Wearing Rx   ? ?   Sphere Cylinder Axis Add  ? Right +2.25 +0.50 165 +2.00  ? Left +2.25 +0.75 172 +2.00  ? ?  ?  ? ?  ? ? ?IMAGING AND PROCEDURES  ?Imaging and Procedures for 10/20/2021 ? ?OCT, Retina - OU - Both Eyes   ? ?   ?Right Eye ?Quality was good. Central Foveal Thickness: 242. Progression has been stable. Findings include normal foveal contour, no IRF, no SRF, vitreomacular adhesion (Stable improvement in vitreous opacities, irregular lamination, scattered IRHM and trace cystic changes, central ellipsoid thinning).  ? ?Left Eye ?Quality was good. Central Foveal Thickness: 291. Progression has improved. Findings include abnormal foveal contour, intraretinal hyper-reflective material, no SRF, intraretinal fluid (Interval improvement in IRF/IRHM temporal fovea and macula).  ? ?Notes ?*Images captured and stored on drive ? ?Diagnosis / Impression:  ?OD: Stable improvement in vitreous opacities, irregular lamination, scattered IRHM and trace cystic changes, mild ellipsoid thinning centrally, central ellipsoid thinning ?OS: Interval improvement in IRF/IRHM temporal fovea and macula ? ?Clinical management:  ?See below ? ?Abbreviations: NFP - Normal foveal profile. CME - cystoid macular edema. PED - pigment epithelial detachment. IRF - intraretinal fluid. SRF - subretinal  fluid. EZ - ellipsoid zone. ERM - epiretinal membrane. ORA - outer retinal atrophy. ORT - outer retinal tubulation. SRHM - subretinal hyper-reflective material. IRHM - intraretinal hyper-reflective material ? ?

## 2021-10-18 ENCOUNTER — Encounter (INDEPENDENT_AMBULATORY_CARE_PROVIDER_SITE_OTHER): Payer: Medicare Other | Admitting: Ophthalmology

## 2021-10-18 DIAGNOSIS — H35033 Hypertensive retinopathy, bilateral: Secondary | ICD-10-CM

## 2021-10-18 DIAGNOSIS — E113413 Type 2 diabetes mellitus with severe nonproliferative diabetic retinopathy with macular edema, bilateral: Secondary | ICD-10-CM

## 2021-10-18 DIAGNOSIS — H25813 Combined forms of age-related cataract, bilateral: Secondary | ICD-10-CM

## 2021-10-18 DIAGNOSIS — I10 Essential (primary) hypertension: Secondary | ICD-10-CM

## 2021-10-19 ENCOUNTER — Other Ambulatory Visit (HOSPITAL_COMMUNITY): Payer: Self-pay | Admitting: Internal Medicine

## 2021-10-19 ENCOUNTER — Encounter (HOSPITAL_BASED_OUTPATIENT_CLINIC_OR_DEPARTMENT_OTHER): Payer: No Typology Code available for payment source | Attending: Internal Medicine | Admitting: Internal Medicine

## 2021-10-19 ENCOUNTER — Ambulatory Visit (HOSPITAL_COMMUNITY)
Admission: RE | Admit: 2021-10-19 | Discharge: 2021-10-19 | Disposition: A | Discharge status: Home / Self Care | Payer: Medicare PPO | Source: Ambulatory Visit | Attending: Internal Medicine | Admitting: Internal Medicine

## 2021-10-19 DIAGNOSIS — M86071 Acute hematogenous osteomyelitis, right ankle and foot: Secondary | ICD-10-CM | POA: Insufficient documentation

## 2021-10-19 DIAGNOSIS — L97514 Non-pressure chronic ulcer of other part of right foot with necrosis of bone: Secondary | ICD-10-CM | POA: Insufficient documentation

## 2021-10-19 DIAGNOSIS — Z9289 Personal history of other medical treatment: Secondary | ICD-10-CM | POA: Insufficient documentation

## 2021-10-19 DIAGNOSIS — E1169 Type 2 diabetes mellitus with other specified complication: Secondary | ICD-10-CM | POA: Insufficient documentation

## 2021-10-19 DIAGNOSIS — F17290 Nicotine dependence, other tobacco product, uncomplicated: Secondary | ICD-10-CM | POA: Diagnosis not present

## 2021-10-19 DIAGNOSIS — Z7984 Long term (current) use of oral hypoglycemic drugs: Secondary | ICD-10-CM | POA: Diagnosis not present

## 2021-10-19 DIAGNOSIS — E11621 Type 2 diabetes mellitus with foot ulcer: Secondary | ICD-10-CM | POA: Insufficient documentation

## 2021-10-19 NOTE — Progress Notes (Addendum)
[name] ([id number]) ?Visit Report for 10/19/2021 ?Allergy List Details ?Patient Name: Date of Service: ?[name] [facility] 10/19/2021 8:00 A M ?Medical Record Number: [id number] ?Patient Account Number: [id number] ?Date of Birth/Sex: Treating RN: ?May 02, 1966 (56 y.o. [name], [name] ?Primary Care Dyshawn Cangelosi: [name] Other Clinician: ?Referring Ludy Messamore: ?Treating Azaylea Maves/Extender: [name] ?Clinic, [place] ?Weeks in Treatment: 0 ?Allergies ?Active Allergies ?lisinopril ?Allergy Notes ?Electronic Signature(s) ?Signed: 10/19/2021 5:27:50 PM By: Fonnie Mu RN ?Entered By: Fonnie Mu on 10/19/2021 08:16:47 ?-------------------------------------------------------------------------------- ?Arrival Information Details ?Patient Name: Date of Service: ?[name] [facility] 10/19/2021 8:00 A M ?Medical Record Number: [id number] ?Patient Account Number: [id number] ?Date of Birth/Sex: Treating RN: ?[birth year] (56 y.o. [name], [name] ?Primary Care Marieli Rudy: [name] Other Clinician: ?Referring Aquilla Shambley: ?Treating Manasa Spease/Extender: [name] ?Clinic, [place] ?Weeks in Treatment: 0 ?Visit Information ?Patient Arrived: Ambulatory ?Arrival Time: 08:04 ?Accompanied By: self ?Transfer Assistance: None ?Patient Identification Verified: Yes ?Secondary Verification Process Completed: Yes ?Patient Requires Transmission-Based Precautions: No ?Patient Has Alerts: No ?Electronic Signature(s) ?Signed: 10/19/2021 5:27:50 PM By: Fonnie Mu RN ?Entered By: Fonnie Mu on 10/19/2021 08:04:56 ?-------------------------------------------------------------------------------- ?Clinic Level of Care Assessment Details ?Patient Name: Date of Service: ?[name] [facility] 10/19/2021 8:00 A M ?Medical Record Number: [id number] ?Patient Account Number: [id number] ?Date of Birth/Sex: Treating RN: ?[birth year] (56 y.o. [name], [name] ?Primary Care Loria Lacina: [name]  Other Clinician: ?Referring Giacomo Valone: ?Treating Trell Secrist/Extender: [name] ?Clinic, [place] ?Weeks in Treatment: 0 ?Clinic Level of Care Assessment Items ?TOOL 3 Quantity Score ?X- 1 0 ?Use when EandM and Procedure is performed on FOLLOW-UP visit ?ASSESSMENTS - Nursing Assessment / Reassessment ?X- 1 10 ?Reassessment of Co-morbidities (includes updates in patient status) ?X- 1 5 ?Reassessment of Adherence to Treatment Plan ?ASSESSMENTS - Wound and Skin Assessment / Reassessment ?[]  - Points for Wound Assessment can only be taken for a new wound of unknown or different etiology and a procedure is ?0 ?NOT performed to that wound ?X- 1 5 ?Simple Wound Assessment / Reassessment - one wound ?[]  - 0 ?Complex Wound Assessment / Reassessment - multiple wounds ?[]  - 0 ?Dermatologic / Skin Assessment (not related to wound area) ?ASSESSMENTS - Focused Assessment ?X- 1 5 ?Circumferential Edema Measurements - multi extremities ?[]  - 0 ?Nutritional Assessment / Counseling / Intervention ?[]  - 0 ?Lower Extremity Assessment (monofilament, tuning fork, pulses) ?[]  - 0 ?Peripheral Arterial Disease Assessment (using hand held doppler) ?ASSESSMENTS - Ostomy and/or Continence Assessment and Care ?[]  - 0 ?Incontinence Assessment and Management ?[]  - 0 ?Ostomy Care Assessment and Management (repouching, etc.) ?PROCESS - Coordination of Care ?[]  - Points for Discharge Coordination can only be taken for a new wound of unknown or different etiology and a procedure ?0 ?is NOT performed to that wound ?X- 1 15 ?Simple Patient / Family Education for ongoing care ?[]  - 0 ?Complex (extensive) Patient / Family Education for ongoing care ?X- 1 10 ?Staff obtains Consents, Records, T Results / Process Orders ?est ?X- 1 10 ?Staff telephones HHA, Nursing Homes / Clarify orders / etc ?[]  - 0 ?Routine Transfer to another Facility (non-emergent condition) ?[]  - 0 ?Routine Hospital Admission (non-emergent condition) ?X- 1 15 ?New Admissions /  Manufacturing engineer / Ordering NPWT Apligraf, etc. ?, ?[]  - 0 ?Emergency Hospital Admission (emergent condition) ?X- 1 10 ?Simple Discharge Coordination ?[]  - 0 ?Complex (extensive) Discharge Coordination ?PROCESS - Special Needs ?[]  - 0 ?Pediatric / Minor Patient Management ?[]  - 0 ?Isolation Patient Management ?[]  - 0 ?Hearing / Language /  Visual special needs ?[]  - 0 ?Assessment of Community assistance (transportation, D/C planning, etc.) ?[]  - 0 ?Additional assistance / Altered mentation ?[]  - 0 ?Support Surface(s) Assessment (bed, cushion, seat, etc.) ?INTERVENTIONS - Wound Cleansing / Measurement ?[]  - Points for Wound Cleaning / Measurement, Wound Dressing, Specimen Collection and Specimen taken to lab can only ?0 ?be taken for a new wound of unknown or different etiology and a procedure is NOT performed to that wound ?X- 1 5 ?Simple Wound Cleansing - one wound ?[]  - 0 ?Complex Wound Cleansing - multiple wounds ?X- 1 5 ?Wound Imaging (photographs - any number of wounds) ?[]  - 0 ?Wound Tracing (instead of photographs) ?X- 1 5 ?Simple Wound Measurement - one wound ?[]  - 0 ?Complex Wound Measurement - multiple wounds ?INTERVENTIONS - Wound Dressings ?X - Small Wound Dressing one or multiple wounds 1 10 ?[]  - 0 ?Medium Wound Dressing one or multiple wounds ?[]  - 0 ?Large Wound Dressing one or multiple wounds ?INTERVENTIONS - Miscellaneous ?[]  - 0 ?External ear exam ?[]  - 0 ?Specimen Collection (cultures, biopsies, blood, body fluids, etc.) ?[]  - 0 ?Specimen(s) / Culture(s) sent or taken to Lab for analysis ?[]  - 0 ?Patient Transfer (multiple staff / Nurse, adult / Similar devices) ?[]  - 0 ?Simple Staple / Suture removal (25 or less) ?[]  - 0 ?Complex Staple / Suture removal (26 or more) ?[]  - 0 ?Hypo / Hyperglycemic Management (close monitor of Blood Glucose) ?X- 1 15 ?Ankle / Brachial Index (ABI) - do not check if billed separately ?X- 1 5 ?Vital Signs ?Has the patient been seen at the hospital within the last  three years: Yes ?Total Score: 130 ?Level Of Care: New/Established - Level 4 ?Electronic Signature(s) ?Signed: 10/19/2021 5:27:50 PM By: Fonnie Mu RN ?Entered By: Fonnie Mu on 10/19/2021 17:24:39 ?-------------------------------------------------------------------------------- ?Encounter Discharge Information Details ?Patient Name: Date of Service: ?[name] [facility] 10/19/2021 8:00 A M ?Medical Record Number: [id number] ?Patient Account Number: [id number] ?Date of Birth/Sex: Treating RN: ?23-Feb-1966 (56 y.o. [name], [name] ?Primary Care Zaynab Chipman: [name] Other Clinician: ?Referring Leverett Camplin: ?Treating Ahijah Devery/Extender: [name] ?Clinic, [place] ?Weeks in Treatment: 0 ?Encounter Discharge Information Items ?Discharge Condition: Stable ?Ambulatory Status: Ambulatory ?Discharge Destination: Home ?Transportation: Private Auto ?Accompanied By: self ?Schedule Follow-up Appointment: Yes ?Clinical Summary of Care: Patient Declined ?Electronic Signature(s) ?Signed: 10/19/2021 5:27:50 PM By: Fonnie Mu RN ?Entered By: Fonnie Mu on 10/19/2021 17:25:20 ?-------------------------------------------------------------------------------- ?Lower Extremity Assessment Details ?Patient Name: ?Date of Service: ?[name] [facility] 10/19/2021 8:00 A M ?Medical Record Number: [id number] ?Patient Account Number: [id number] ?Date of Birth/Sex: ?Treating RN: ?1966-05-14 (56 y.o. [name], [name] ?Primary Care Jobina Maita: Clinic, [place] ?Other Clinician: ?Referring Kaytie Ratcliffe: ?Treating Gola Bribiesca/Extender: [name] ?Clinic, [place] ?Weeks in Treatment: 0 ?Edema Assessment ?Assessed: [Left: No] [Right: Yes] ?Edema: [Left: Ye] [Right: s] ?Calf ?Left: Right: ?Point of Measurement: 37 cm From Medial Instep 38.5 cm ?Ankle ?Left: Right: ?Point of Measurement: 9 cm From Medial Instep 25.4 cm ?Knee To Floor ?Left: Right: ?From Medial Instep 44 cm ?Vascular  Assessment ?Pulses: ?Dorsalis Pedis ?Palpable: [Right:Yes] ?Posterior Tibial ?Palpable: [Right:Yes] ?Electronic Signature(s) ?Signed: 10/19/2021 5:27:50 PM By: Fonnie Mu RN ?Entered By: Fonnie Mu on 10/19/2021 08

## 2021-10-19 NOTE — Progress Notes (Signed)
Christian, [name] ([id number]) ?Visit Report for 10/19/2021 ?Abuse Risk Screen Details ?Patient Name: Date of Service: ?[name] [facility] 10/19/2021 8:00 A M ?Medical Record Number: [id number] ?Patient Account Number: [id number] ?Date of Birth/Sex: Treating RN: ?31-Mar-1966 (56 y.o. [name], Christian ?Primary Care Adrienna Karis: [name] Other Clinician: ?Referring Artice Bergerson: ?Treating Vann Okerlund/Extender: [name] ?Clinic, [place] ?Weeks in Treatment: 0 ?Abuse Risk Screen Items ?Answer ?ABUSE RISK SCREEN: ?Has anyone close to you tried to hurt or harm you recentlyo No ?Do you feel uncomfortable with anyone in your familyo No ?Has anyone forced you do things that you didnt want to doo No ?Electronic Signature(s) ?Signed: 10/19/2021 5:27:50 PM By: Fonnie Mu RN ?Entered By: Fonnie Mu on 10/19/2021 08:12:14 ?-------------------------------------------------------------------------------- ?Activities of Daily Living Details ?Patient Name: Date of Service: ?[name] [facility] 10/19/2021 8:00 A M ?Medical Record Number: [id number] ?Patient Account Number: [id number] ?Date of Birth/Sex: Treating RN: ?Jan 20, 1966 (56 y.o. [name], Christian ?Primary Care Trinity Hyland: [name] Other Clinician: ?Referring Keyonta Madrid: ?Treating Tru Leopard/Extender: [name] ?Clinic, [place] ?Weeks in Treatment: 0 ?Activities of Daily Living Items ?Answer ?Activities of Daily Living (Please select one for each item) ?Drive Automobile Completely Able ?T Medications ?ake Completely Able ?Use T elephone Completely Able ?Care for Appearance Completely Able ?Use T oilet Completely Able ?Bath / Shower Completely Able ?Dress Self Completely Able ?Feed Self Completely Able ?Walk Completely Able ?Get In / Out Bed Completely Able ?Housework Completely Able ?Prepare Meals Completely Able ?Handle Money Completely Able ?Shop for Self Completely Able ?Electronic Signature(s) ?Signed: 10/19/2021 5:27:50 PM By: Fonnie Mu RN ?Entered By: Fonnie Mu on 10/19/2021 08:12:45 ?-------------------------------------------------------------------------------- ?Education Screening Details ?Patient Name: ?Date of Service: ?[name] [facility] 10/19/2021 8:00 A M ?Medical Record Number: [id number] ?Patient Account Number: [id number] ?Date of Birth/Sex: ?Treating RN: ?03/11/1966 (56 y.o. [name], Christian ?Primary Care [name]: Clinic, [name] ?Other Clinician: ?Referring [name]: ?Treating Asma Boldon/Extender: [name] ?Clinic, [place] ?Weeks in Treatment: 0 ?Primary Learner Assessed: Patient ?Learning Preferences/Education Level/Primary Language ?Learning Preference: Explanation, Demonstration, Communication Board, Printed Material ?Highest Education Level: College or Above ?Preferred Language: English ?Cognitive Barrier ?Language Barrier: No ?Translator Needed: No ?Memory Deficit: No ?Emotional Barrier: No ?Cultural/Religious Beliefs Affecting Medical Care: No ?Physical Barrier ?Impaired Vision: Yes Glasses ?Impaired Hearing: No ?Decreased Hand dexterity: No ?Knowledge/Comprehension ?Knowledge Level: High ?Comprehension Level: High ?Ability to understand written instructions: High ?Ability to understand verbal instructions: High ?Motivation ?Anxiety Level: Calm ?Cooperation: Cooperative ?Education Importance: Denies Need ?Interest in Health Problems: Asks Questions ?Perception: Coherent ?Willingness to Engage in Self-Management High ?Activities: ?Readiness to Engage in Self-Management High ?Activities: ?Electronic Signature(s) ?Signed: 10/19/2021 5:27:50 PM By: Fonnie Mu RN ?Entered By: Fonnie Mu on 10/19/2021 08:13:31 ?-------------------------------------------------------------------------------- ?Fall Risk Assessment Details ?Patient Name: ?Date of Service: ?[name] [facility] 10/19/2021 8:00 A M ?Medical Record Number: [id number] ?Patient Account Number: [id number] ?Date of Birth/Sex: ?Treating  RN: ?[birth year] (56 y.o. [name], Christian ?Primary Care [name]: Clinic, [place] ?Other Clinician: ?Referring [name]: ?Treating [name]/Extender: [name] ?Clinic, [place] ?Weeks in Treatment: 0 ?Fall Risk Assessment Items ?Have you had 2 or more falls in the last 12 monthso 0 No ?Have you had any fall that resulted in injury in the last 12 monthso 0 No ?FALLS RISK SCREEN ?History of falling - immediate or within 3 months 0 No ?Secondary diagnosis (Do you have 2 or more medical diagnoseso) 0 No ?Ambulatory aid ?None/bed rest/wheelchair/nurse 0 No ?Crutches/cane/walker 0 No ?Furniture 0 No ?Intravenous therapy Access/Saline/Heparin Lock 0 No ?Gait/Transferring ?Normal/ bed  rest/ wheelchair 0 No ?Weak (short steps with or without shuffle, stooped but able to lift head while walking, may seek 0 No ?support from furniture) ?Impaired (short steps with shuffle, may have difficulty arising from chair, head down, impaired 0 No ?balance) ?Mental Status ?Oriented to own ability 0 No ?Electronic Signature(s) ?Signed: 10/19/2021 5:27:50 PM By: Fonnie Mu RN ?Entered By: Fonnie Mu on 10/19/2021 08:13:41 ?-------------------------------------------------------------------------------- ?Foot Assessment Details ?Patient Name: ?Date of Service: ?[name] [facility] 10/19/2021 8:00 A M ?Medical Record Number: [id number] ?Patient Account Number: [id number] ?Date of Birth/Sex: ?Treating RN: ?03-Jul-1965 (56 y.o. [name], Christian ?Primary Care [name]: Clinic, [place] ?Other Clinician: ?Referring [name]: ?Treating Math Brazie/Extender: [name] ?Clinic, [place] ?Weeks in Treatment: 0 ?Foot Assessment Items ?Site Locations ?+ = Sensation present, - = Sensation absent, C = Callus, U = Ulcer ?R = Redness, W = Warmth, M = Maceration, PU = Pre-ulcerative lesion ?F = Fissure, S = Swelling, D = Dryness ?Assessment ?Right: Left: ?Other Deformity: No No ?Prior Foot Ulcer: Yes No ?Prior  Amputation: No No ?Charcot Joint: No No ?Ambulatory Status: Ambulatory Without Help ?Gait: Steady ?Electronic Signature(s) ?Signed: 10/19/2021 5:27:50 PM By: Fonnie Mu RN ?Entered By: Fonnie Mu on 10/19/2021 08:38:00 ?-------------------------------------------------------------------------------- ?Nutrition Risk Screening Details ?Patient Name: ?Date of Service: ?[name] [facility] 10/19/2021 8:00 A M ?Medical Record Number: [id number] ?Patient Account Number: [id number] ?Date of Birth/Sex: ?Treating RN: ?1966-06-05 (56 y.o. [name], Christian ?Primary Care Cumi Sanagustin: Clinic, [place] ?Other Clinician: ?Referring Cullan Launer: ?Treating Laurelyn Terrero/Extender: [name] ?Clinic, [place] ?Weeks in Treatment: 0 ?Height (in): 69 ?Weight (lbs): 180 ?Body Mass Index (BMI): 26.6 ?Nutrition Risk Screening Items ?Score Screening ?NUTRITION RISK SCREEN: ?I have an illness or condition that made me change the kind and/or amount of food I eat 0 No ?I eat fewer than two meals per day 0 No ?I eat few fruits and vegetables, or milk products 0 No ?I have three or more drinks of beer, liquor or wine almost every day 0 No ?I have tooth or mouth problems that make it hard for me to eat 0 No ?I don't always have enough money to buy the food I need 0 No ?I eat alone most of the time 0 No ?I take three or more different prescribed or over-the-counter drugs a day 0 No ?Without wanting to, I have lost or gained 10 pounds in the last six months 0 No ?I am not always physically able to shop, cook and/or feed myself 0 No ?Nutrition Protocols ?Good Risk Protocol 0 No interventions needed ?Moderate Risk Protocol ?High Risk Proctocol ?Risk Level: Good Risk ?Score: 0 ?Electronic Signature(s) ?Signed: 10/19/2021 5:27:50 PM By: Fonnie Mu RN ?Entered By: Fonnie Mu on 10/19/2021 08:13:54 ?

## 2021-10-20 ENCOUNTER — Ambulatory Visit (INDEPENDENT_AMBULATORY_CARE_PROVIDER_SITE_OTHER): Payer: No Typology Code available for payment source | Admitting: Ophthalmology

## 2021-10-20 ENCOUNTER — Encounter (INDEPENDENT_AMBULATORY_CARE_PROVIDER_SITE_OTHER): Payer: Self-pay | Admitting: Ophthalmology

## 2021-10-20 DIAGNOSIS — I10 Essential (primary) hypertension: Secondary | ICD-10-CM | POA: Diagnosis not present

## 2021-10-20 DIAGNOSIS — H35033 Hypertensive retinopathy, bilateral: Secondary | ICD-10-CM

## 2021-10-20 DIAGNOSIS — E113413 Type 2 diabetes mellitus with severe nonproliferative diabetic retinopathy with macular edema, bilateral: Secondary | ICD-10-CM

## 2021-10-20 DIAGNOSIS — H25813 Combined forms of age-related cataract, bilateral: Secondary | ICD-10-CM | POA: Diagnosis not present

## 2021-10-20 NOTE — Progress Notes (Addendum)
Christian, [name] ([id number]) ?Visit Report for 10/19/2021 ?Chief Complaint Document Details ?Patient Name: Date of Service: ?[name] [facility] 10/19/2021 8:00 A M ?Medical Record Number: [id number] ?Patient Account Number: [id number] ?Date of Birth/Sex: Treating RN: ?1965-10-30 (56 y.o. [name], [name] ?Primary Care Provider: [name] Other Clinician: ?Referring Provider: ?Treating Provider/Extender: [name] ?Clinic, [place] ?Weeks in Treatment: 0 ?Information Obtained from: Patient ?Chief Complaint ?10/19/2021; Right great toe wound ?Electronic Signature(s) ?Signed: 10/20/2021 4:14:55 PM By: [name] DO ?Entered By: [name] on 10/20/2021 16:14:54 ?-------------------------------------------------------------------------------- ?HPI Details ?Patient Name: Date of Service: ?[name] [facility] 10/19/2021 8:00 A M ?Medical Record Number: [id number] ?Patient Account Number: [id number] ?Date of Birth/Sex: Treating RN: ?05-Apr-1966 (56 y.o. 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 He had an MRI of the right foot on 10/16/2021 that showed signal changes in the first proximal and distal ?phalanx consistent with acute osteomyelitis. He states that he developed a wound spontaneously to this area 10 days ago. He does have significant peripheral ?neuropathy and does not have sensation to the wound area or bottom of his feet. He reports being on 3 different antibiotics including clindamycin and ?doxycycline in the past couple of weeks. He is currently on Augmentin  based on wound cultures done in the ED. He has currently been keeping the area ?covered. He currently denies systemic signs of infection. ?Electronic Signature(s) ?Signed: 10/20/2021 4:23:29 PM By: [name] DO ?Previous Signature: 10/20/2021 4:20:30 PM Version By: [name] DO ?Entered By: [name] on 10/20/2021 16:23:29 ?-------------------------------------------------------------------------------- ?Physical Exam Details ?Patient Name: Date of Service: ?[name] [facility] 10/19/2021 8:00 A M ?Medical Record Number: [id number] ?Patient Account Number: [id number] ?Date of Birth/Sex: Treating RN: ?01-25-1966 (57 y.o. [name], [name] ?Primary Care Provider: Other Clinician: ?[name] ?Referring Provider: ?Treating Provider/Extender: [name] ?Clinic, [place] ?Weeks in Treatment: 0 ?Constitutional ?respirations regular, non-labored and within target range for patient.Marland Kitchen ?Cardiovascular ?2+ dorsalis pedis/posterior tibialis pulses. ?Psychiatric ?pleasant and cooperative. ?Notes ?Right foot: to the great toe there is an open wound with a non viable surface throughout. No increased warmth, erythema or purulent drainage noted. ?Electronic Signature(s) ?Signed: 10/20/2021 4:25:43 PM By: [name] DO ?Entered By: [name] on 10/20/2021 16:25:42 ?-------------------------------------------------------------------------------- ?Physician Orders Details ?Patient Name: Date of Service: ?[name] [facility] 10/19/2021 8:00 A M ?Medical Record Number: [id number] ?Patient Account Number: [id number] ?Date of Birth/Sex: Treating RN: ?February 15, 1966 (56 y.o. [name], [name] ?Primary Care Provider: [name] Other Clinician: ?Referring Provider: ?Treating Provider/Extender: [name] ?Clinic, [place] ?Weeks in Treatment: 0 ?Verbal / Phone Orders: No ?Diagnosis Coding ?ICD-10 Coding ?Code Description ?E11.621 Type 2 diabetes mellitus with foot ulcer ?L97.514  Non-pressure chronic ulcer of other part of right foot with necrosis of bone ?M86.071 Acute hematogenous osteomyelitis, right ankle and foot ?Follow-up Appointments ?ppointment in 1 week. - on TUESDAY 10/24/21 @ 1315 with Dr. Mikey Bussing and Maryruth Bun Room # 9. ?Return A ?Will resume Thursday appointments after that visit!! ?Bathing/ Shower/ Hygiene ?May shower with protection but do not get wound dressing(s) wet. ?Edema Control - Lymphedema / SCD / Other ?Elevate legs to the level of the heart or  above for 30 minutes daily and/or when sitting, a frequency of: ?Avoid standing for long periods of time. ?Moisturize legs daily. ?Off-Loading ?Other: - Keep pressure off of right foot ?Hyperbaric Oxygen Therapy ?Indication: - Wagner Grade 3/ + osteomyelitis Right Great Toe ?If appropriate for treatment, begin HBOT per protocol: ?2.0 ATA for 90 Minutes with 2 Five (5) Minute A Breaks ?ir ?Total Number of Treatments: - 40 ?One treatments per day (delivered Monday through Friday unless otherwise specified in Special Instructions below): ?Finger stick Blood Glucose Pre- and Post- HBOT Treatment. ?Follow Hyperbaric Oxygen Glycemia Protocol ?A frin (Oxymetazoline HCL) 0.05% nasal spray - 1 spray in both nostrils daily as needed prior to HBO treatment for difficulty clearing ears ?Wound Treatment ?Wound #1 - T Great ?oe Wound Laterality: Right, Medial ?Cleanser: Soap and Water 1 x Per Day/15 Days ?Discharge Instructions: May shower and wash wound with dial antibacterial soap and water prior to dressing change. ?Cleanser: Wound Cleanser (DME) (Generic) 1 x Per Day/15 Days ?Discharge Instructions: Cleanse the wound with wound cleanser prior to applying a clean dressing using gauze sponges, not tissue or cotton balls. ?Prim Dressing: Dakin's Solution 0.25%, 16 (oz) (DME) (Generic) 1 x Per Day/15 Days ?ary ?Discharge Instructions: Moisten gauze with Dakin's solution ?Secondary Dressing: Bordered Gauze, 4x4 in (DME) (Generic) 1 x Per Day/15  Days ?Discharge Instructions: Apply over primary dressing as directed. ?Secured With: Insurance underwriter, Sterile 2x75 (in/in) (DME) (Generic) 1 x Per Day/15 Days ?Discharge Instructions: Secure with stretch gauze as directed. ?Secured With: 34M Medipore H Soft Cloth Surgical T ape, 4 x 10 (in/yd) (DME) (Generic) 1 x Per Day/15 Days ?Discharge Instructions: Secure with tape as directed. ?Consults ?Infectious Disease - Right Great toe wound with + Osteomyeltis per MRI ?Endocrinology - Dr. Romero Belling ?Radiology ?X-ray, Chest ?Services and Therapies ?nkle Brachial Index (ABI) - with TBI's ?A ?GLYCEMIA INTERVENTIONS PROTOCOL ?PRE-HBO GLYCEMIA INTERVENTIONS ?ACTION INTERVENTION ?Obtain pre-HBO capillary blood glucose (ensure ?1 ?physician order is in chart). ?A. Notify HBO physician and await physician ?orders. ?2 If result is 70 mg/dl or below: ?B. If the result meets the hospital definition of a ?critical result, follow hospital policy. ?A. Give patient an 8 ounce Glucerna Shake??, an ?8 ounce Ensure??, or 8 ounces of a ?Glucerna/Ensure equivalent dietary ?supplement*. ?B. Wait 30 minutes. ?If result is 71 mg/dl to 443 mg/dl: C. Retest patients capillary blood glucose ?(CBG). ?D. If result greater than or equal to 110 mg/dl, ?proceed with HBO. ?If result less than 110 mg/dl, notify HBO ?physician and consider holding HBO. ?If result is 131 mg/dl to 154 mg/dl: A. Proceed with HBO. ?A. Notify HBO physician and await physician ?orders. ?B. It is recommended to hold HBO and do ?If result is 250 mg/dl or greater: ?blood/urine ketone testing. ?C. If the result meets the hospital definition of a ?critical result, follow hospital policy. ?POST-HBO GLYCEMIA INTERVENTIONS ?ACTION INTERVENTION ?Obtain post HBO capillary blood glucose (ensure ?1 ?physician order is in chart). ?A. Notify HBO physician and await physician ?orders. ?2 If result is 70 mg/dl or below: ?B. If the result meets the hospital definition of  a ?critical result, follow hospital policy. ?A. Give patient an 8 ounce Glucerna Shake??, an ?8 ounce Ensure??, or 8 ounces of a ?Glucerna/Ensure equivalent dietary ?supplement*. ?B. Wait 15 minutes for

## 2021-10-21 ENCOUNTER — Encounter (INDEPENDENT_AMBULATORY_CARE_PROVIDER_SITE_OTHER): Payer: Self-pay | Admitting: Ophthalmology

## 2021-10-21 MED ORDER — BEVACIZUMAB CHEMO INJECTION 1.25MG/0.05ML SYRINGE FOR KALEIDOSCOPE
1.2500 mg | INTRAVITREAL | Status: AC | PRN
  Administered 2021-10-21: 1.25 mg via INTRAVITREAL

## 2021-10-24 ENCOUNTER — Encounter (HOSPITAL_BASED_OUTPATIENT_CLINIC_OR_DEPARTMENT_OTHER): Payer: No Typology Code available for payment source | Admitting: Internal Medicine

## 2021-10-24 DIAGNOSIS — L97514 Non-pressure chronic ulcer of other part of right foot with necrosis of bone: Secondary | ICD-10-CM

## 2021-10-24 DIAGNOSIS — E11621 Type 2 diabetes mellitus with foot ulcer: Secondary | ICD-10-CM | POA: Diagnosis not present

## 2021-10-24 NOTE — Progress Notes (Signed)
REMIJIO, HARVARD ([id number]) ?Visit Report for 10/24/2021 ?Arrival Information Details ?Patient Name: Date of Service: ?[name] [facility] 10/24/2021 1:15 PM ?Medical Record Number: [id number] ?Patient Account Number: [id number] ?Date of Birth/Sex: Treating RN: ?1965/09/30 (56 y.o. [name], [name] ?Primary Care Hadeel Hillebrand: [name] Other Clinician: ?Referring Latrece Nitta: ?Treating Jenella Craigie/Extender: [name] ?Clinic, [place] ?Weeks in Treatment: 0 ?Visit Information History Since Last Visit ?Added or deleted any medications: No ?Patient Arrived: Ambulatory ?Any new allergies or adverse reactions: No ?Arrival Time: 14:10 ?Had a fall or experienced change in No ?Accompanied By: self ?activities of daily living that may affect ?Transfer Assistance: None ?risk of falls: ?Patient Identification Verified: Yes ?Signs or symptoms of abuse/neglect since last visito No ?Secondary Verification Process Completed: Yes ?Hospitalized since last visit: No ?Patient Requires Transmission-Based Precautions: No ?Implantable device outside of the clinic excluding No ?Patient Has Alerts: No ?cellular tissue based products placed in the center ?since last visit: ?Has Dressing in Place as Prescribed: Yes ?Pain Present Now: Yes ?Electronic Signature(s) ?Signed: 10/24/2021 4:16:50 PM By: Fonnie Mu RN ?Entered By: Fonnie Mu on 10/24/2021 14:10:56 ?-------------------------------------------------------------------------------- ?Encounter Discharge Information Details ?Patient Name: Date of Service: ?[name] [facility] 10/24/2021 1:15 PM ?Medical Record Number: [id number] ?Patient Account Number: [id number] ?Date of Birth/Sex: Treating RN: ?April 26, 1966 (56 y.o. [name], [name] ?Primary Care Rhiley Tarver: [name] Other Clinician: ?Referring Louella Medaglia: ?Treating Alayza Pieper/Extender: [name] ?Clinic, [place] ?Weeks in Treatment: 0 ?Encounter Discharge Information Items Post Procedure  Vitals ?Discharge Condition: Stable ?Temperature (F): 98.7 ?Ambulatory Status: Ambulatory ?Pulse (bpm): 74 ?Discharge Destination: Home ?Respiratory Rate (breaths/min): 17 ?Transportation: Private Auto ?Blood Pressure (mmHg): 134/74 ?Accompanied By: self ?Schedule Follow-up Appointment: Yes ?Clinical Summary of Care: Patient Declined ?Electronic Signature(s) ?Signed: 10/24/2021 4:16:50 PM By: Fonnie Mu RN ?Entered By: Fonnie Mu on 10/24/2021 14:49:41 ?-------------------------------------------------------------------------------- ?Lower Extremity Assessment Details ?Patient Name: ?Date of Service: ?[name] [facility] 10/24/2021 1:15 PM ?Medical Record Number: [id number] ?Patient Account Number: [id number] ?Date of Birth/Sex: ?Treating RN: ?03-Oct-1965 (56 y.o. [name], [name] ?Primary Care Braelynne Garinger: Clinic, [place] ?Other Clinician: ?Referring Sanaii Caporaso: ?Treating Ezmae Speers/Extender: [name] ?Clinic, [place] ?Weeks in Treatment: 0 ?Edema Assessment ?Assessed: [Left: No] [Right: Yes] ?Edema: [Left: Ye] [Right: s] ?Calf ?Left: Right: ?Point of Measurement: 37 cm From Medial Instep 38.5 cm ?Ankle ?Left: Right: ?Point of Measurement: 9 cm From Medial Instep 25.4 cm ?Vascular Assessment ?Pulses: ?Dorsalis Pedis ?Palpable: [Right:Yes] ?Posterior Tibial ?Palpable: [Right:Yes] ?Electronic Signature(s) ?Signed: 10/24/2021 4:16:50 PM By: Fonnie Mu RN ?Entered By: Fonnie Mu on 10/24/2021 14:11:46 ?-------------------------------------------------------------------------------- ?Multi Wound Chart Details ?Patient Name: ?Date of Service: ?[name] [facility] 10/24/2021 1:15 PM ?Medical Record Number: [id number] ?Patient Account Number: [id number] ?Date of Birth/Sex: ?Treating RN: ?07/09/1965 (56 y.o. [name], [name] ?Primary Care Jake Fuhrmann: Clinic, [place] ?Other Clinician: ?Referring Chevella Pearce: ?Treating Mylan Lengyel/Extender: [name] ?Clinic, [place] ?Weeks in  Treatment: 0 ?Vital Signs ?Height(in): 69 ?Pulse(bpm): 74 ?Weight(lbs): 180 ?Blood Pressure(mmHg): 142/84 ?Body Mass Index(BMI): 26.6 ?Temperature(??F): 98.7 ?Respiratory Rate(breaths/min): 17 ?Photos: [1:Right, Medial T Great oe] [N/A:N/A N/A] ?Wound Location: [1:Gradually Appeared] [N/A:N/A] ?Wounding Event: [1:Diabetic Wound/Ulcer of the Lower] [N/A:N/A] ?Primary Etiology: [1:Extremity Hypertension, Type II Diabetes,] [N/A:N/A] ?Comorbid History: [1:Osteomyelitis, Neuropathy 10/10/2021] [N/A:N/A] ?Date Acquired: [1:0] [N/A:N/A] ?Weeks of Treatment: [1:Open] [N/A:N/A] ?Wound Status: [1:No] [N/A:N/A] ?Wound Recurrence: [1:2x1.7x0.8] [N/A:N/A] ?Measurements L x W x D (cm) [1:2.67] [N/A:N/A] ?A (cm?) : ?rea [1:2.136] [N/A:N/A] ?Volume (cm?) : [1:43.30%] [N/A:N/A] ?% Reduction in A [1:rea: 43.30%] [N/A:N/A] ?% Reduction in Volume: [1:Grade 3] [N/A:N/A] ?Classification: [1:Medium] [N/A:N/A] ?Exudate A mount: [  1:Serosanguineous] [N/A:N/A] ?Exudate Type: [1:red, brown] [N/A:N/A] ?Exudate Color: [1:Distinct, outline attached] [N/A:N/A] ?Wound Margin: [1:Small (1-33%)] [N/A:N/A] ?Granulation A mount: [1:Red, Pink] [N/A:N/A] ?Granulation Quality: [1:Large (67-100%)] [N/A:N/A] ?Necrotic A mount: [1:Eschar, Adherent Slough] [N/A:N/A] ?Necrotic Tissue: ?[1:Bone: Yes] [N/A:N/A] ?Exposed Structures: ?[1:Fascia: No Fat Layer (Subcutaneous Tissue): No Tendon: No Muscle: No Joint: No None] [N/A:N/A] ?Epithelialization: [1:Debridement - Selective/Open Wound] [N/A:N/A] ?Debridement: ?Pre-procedure Verification/Time Out 14:30 [N/A:N/A] ?Taken: [1:Lidocaine] [N/A:N/A] ?Pain Control: [1:Skin/Epidermis] [N/A:N/A] ?Level: [1:3.4] [N/A:N/A] ?Debridement A (sq cm): [1:rea Forceps, Scissors] [N/A:N/A] ?Instrument: [1:Minimum] [N/A:N/A] ?Bleeding: [1:Pressure] [N/A:N/A] ?Hemostasis A chieved: [1:0] [N/A:N/A] ?Procedural Pain: [1:0] [N/A:N/A] ?Post Procedural Pain: [1:Procedure was tolerated well] [N/A:N/A] ?Debridement Treatment Response:  [1:2x1.7x0.8] [N/A:N/A] ?Post Debridement Measurements L x ?W x D (cm) [1:2.136] [N/A:N/A] ?Post Debridement Volume: (cm?) [1:Debridement] [N/A:N/A] ?Treatment Notes ?Electronic Signature(s) ?Signed: 10/24/2021 4:16:50 PM By: Fonnie Mu RN ?Signed: 10/24/2021 4:28:23 PM By: [name] DO ?Entered By: [name] on 10/24/2021 14:37:01 ?-------------------------------------------------------------------------------- ?Multi-Disciplinary Care Plan Details ?Patient Name: ?Date of Service: ?[name] [facility] 10/24/2021 1:15 PM ?Medical Record Number: [id number] ?Patient Account Number: [id number] ?Date of Birth/Sex: ?Treating RN: ?1965-11-01 (56 y.o. [name], [name] ?Primary Care Jayshon Dommer: Clinic, [place] ?Other Clinician: ?Referring Cindia Hustead: ?Treating Bibiana Gillean/Extender: [name] ?Clinic, [place] ?Weeks in Treatment: 0 ?Active Inactive ?Wound/Skin Impairment ?Nursing Diagnoses: ?Impaired tissue integrity ?Knowledge deficit related to ulceration/compromised skin integrity ?Goals: ?Patient will demonstrate a reduced rate of smoking or cessation of smoking ?Date Initiated: 10/19/2021 ?Target Resolution Date: 11/01/2021 ?Goal Status: Active ?Patient will have a decrease in wound volume by X% from date: (specify in notes) ?Date Initiated: 10/19/2021 ?Target Resolution Date: 11/02/2021 ?Goal Status: Active ?Patient/caregiver will verbalize understanding of skin care regimen ?Date Initiated: 10/19/2021 ?Target Resolution Date: 11/01/2021 ?Goal Status: Active ?Ulcer/skin breakdown will have a volume reduction of 30% by week 4 ?Date Initiated: 10/19/2021 ?Target Resolution Date: 11/02/2021 ?Goal Status: Active ?Interventions: ?Assess patient/caregiver ability to obtain necessary supplies ?Assess patient/caregiver ability to perform ulcer/skin care regimen upon admission and as needed ?Assess ulceration(s) every visit ?Provide education on smoking ?Notes: ?Electronic Signature(s) ?Signed: 10/24/2021 4:16:50  PM By: Fonnie Mu RN ?Entered By: Fonnie Mu on 10/24/2021 14:30:27 ?-------------------------------------------------------------------------------- ?Pain Assessment Details ?Patient Name: ?Date of Service: ?W

## 2021-10-24 NOTE — Progress Notes (Signed)
[name] ([id number]) ?Visit Report for 10/24/2021 ?Chief Complaint Document Details ?Patient Name: Date of Service: ?[name] [facility] 10/24/2021 1:15 PM ?Medical Record Number: [id number] ?Patient Account Number: [id number] ?Date of Birth/Sex: Treating RN: ?06-04-1966 (56 y.o. [name], [name] ?Primary Care Provider: [name] Other Clinician: ?Referring Provider: ?Treating Provider/Extender: [name] ?Clinic, [place] ?Weeks in Treatment: 0 ?Information Obtained from: Patient ?Chief Complaint ?10/19/2021; Right great toe wound ?Electronic Signature(s) ?Signed: 10/24/2021 4:28:23 PM By: Christian Corwin DO ?Entered By: [name] on 10/24/2021 14:37:26 ?-------------------------------------------------------------------------------- ?Debridement Details ?Patient Name: Date of Service: ?[name] [facility] 10/24/2021 1:15 PM ?Medical Record Number: [id number] ?Patient Account Number: [id number] ?Date of Birth/Sex: Treating RN: ?03/22/1966 (56 y.o. [name], [name] ?Primary Care Provider: [name] Other Clinician: ?Referring Provider: ?Treating Provider/Extender: [name] ?Clinic, [place] ?Weeks in Treatment: 0 ?Debridement Performed for Assessment: Wound #1 Right,Medial T Great ?oe ?Performed By: Physician Christian Corwin, DO ?Debridement Type: Debridement ?Severity of Tissue Pre Debridement: Fat layer exposed ?Level of Consciousness (Pre-procedure): Awake and Alert ?Pre-procedure Verification/Time Out Yes - 14:30 ?Taken: ?Start Time: 14:30 ?Pain Control: Lidocaine ?T Area Debrided (L x W): ?otal 2 (cm) x 1.7 (cm) = 3.4 (cm?) ?Tissue and other material debrided: ?Viable, Non-Viable, Skin: Dermis , Skin: Epidermis ?Level: Skin/Epidermis ?Debridement Description: Selective/Open Wound ?Instrument: Forceps, Scissors ?Bleeding: Minimum ?Hemostasis Achieved: Pressure ?End Time: 14:30 ?Procedural Pain: 0 ?Post Procedural Pain: 0 ?Response to Treatment: Procedure was  tolerated well ?Level of Consciousness (Post- Awake and Alert ?procedure): ?Post Debridement Measurements of Total Wound ?Length: (cm) 2 ?Width: (cm) 1.7 ?Depth: (cm) 0.8 ?Volume: (cm?) 2.136 ?Character of Wound/Ulcer Post Debridement: Improved ?Severity of Tissue Post Debridement: Fat layer exposed ?Post Procedure Diagnosis ?Same as Pre-procedure ?Electronic Signature(s) ?Signed: 10/24/2021 4:16:50 PM By: Christian Mu RN ?Signed: 10/24/2021 4:28:23 PM By: Christian Corwin DO ?Entered By: [name] on 10/24/2021 14:34:11 ?-------------------------------------------------------------------------------- ?HPI Details ?Patient Name: Date of Service: ?[name] [facility] 10/24/2021 1:15 PM ?Medical Record Number: [id number] ?Patient Account Number: [id number] ?Date of Birth/Sex: Treating RN: ?September 15, 1965 (56 y.o. [name], [name] ?Primary Care Provider: [name] Other Clinician: ?Referring Provider: ?Treating Provider/Extender: [name] ?Clinic, [place] ?Weeks in Treatment: 0 ?History of Present Illness ?HPI Description: Admission 10/19/2021 ?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 He had an MRI of the right foot on 10/16/2021 that showed signal changes in the first proximal and distal ?phalanx consistent with acute osteomyelitis. He states that he developed a wound spontaneously to this area 10 days ago. He does have significant peripheral ?neuropathy and does not have sensation to the wound area or bottom of his feet. He reports being on 3 different antibiotics including clindamycin and ?doxycycline in the past couple of weeks. He is currently on Augmentin based on wound cultures done in the ED. He has currently been keeping the area ?covered. He currently denies systemic signs of infection. ?4/25; patient  presents for follow-up. HBO orders have been placed and we are awaiting [facility] approval. He obtained his chest x-ray with no cardiopulmonary ?abnormalities. He has been using Dakin's wet-to-dry dressings to the toe wound. He currently denies systemic signs of infection. He is currently taking ?Augmentin. He has an infectious disease appointment on 5/2. He has not heard back about his ABIs with TBI's. ?Electronic Signature(s) ?Signed: 10/24/2021 4:28:23 PM By: Christian Corwin  DO ?Entered By: [name] on 10/24/2021 14:43:07 ?-------------------------------------------------------------------------------- ?Physical Exam Details ?Patient Name: Date of Service: ?[name] [facility] 10/24/2021 1:15 PM ?Medical Record Number: [id number] ?Patient Account Number: [id number] ?Date of Birth/Sex: Treating RN: ?27-Jul-1965 (56 y.o. [name], [name] ?Primary Care Provider: [name] Other Clinician: ?Referring Provider: ?Treating Provider/Extender: [name] ?Clinic, [place] ?Weeks in Treatment: 0 ?Constitutional ?respirations regular, non-labored and within target range for patient.Marland Kitchen ?Cardiovascular ?2+ dorsalis pedis/posterior tibialis pulses. ?Psychiatric ?pleasant and cooperative. ?Notes ?Right foot: T the great toe there is an open wound with nonviable tissue and granulation tissue present. No surrounding signs of infection. ?o ?Electronic Signature(s) ?Signed: 10/24/2021 4:28:23 PM By: Christian Corwin DO ?Entered By: [name] on 10/24/2021 14:40:08 ?-------------------------------------------------------------------------------- ?Physician Orders Details ?Patient Name: ?Date of Service: ?[name] [facility] 10/24/2021 1:15 PM ?Medical Record Number: [id number] ?Patient Account Number: [id number] ?Date of Birth/Sex: ?Treating RN: ?[birth year] (56 y.o. [name], [name] ?Primary Care Provider: Clinic, [place] ?Other Clinician: ?Referring Provider: ?Treating Provider/Extender: [name] ?Clinic, [place] ?Weeks in Treatment: 0 ?Verbal / Phone Orders: No ?Diagnosis Coding ?Follow-up Appointments ?ppointment in 1 week. - next Thursday 11/02/2021 with Dr. Mikey Bussing and Maryruth Bun, RN Room # 9 @ 1:45 ?Return A ?Bathing/ Shower/ Hygiene ?May shower with protection but do not get wound dressing(s) wet. ?Edema Control - Lymphedema / SCD / Other ?Elevate legs to the level of the heart or above for 30 minutes daily and/or when sitting, a frequency of: ?Avoid standing for long periods of time. ?Moisturize legs daily. ?Off-Loading ?Other: - Keep pressure off of right foot ?Hyperbaric Oxygen Therapy ?Indication: - Wagner Grade 3/ + osteomyelitis Right Great Toe ?If appropriate for treatment, begin HBOT per protocol: ?2.0 ATA for 90 Minutes with 2 Five (5) Minute A Breaks ?ir ?Total Number of Treatments: - 40 ?One treatments per day (delivered Monday through Friday unless otherwise specified in Special Instructions below): ?Finger stick Blood Glucose Pre- and Post- HBOT Treatment. ?Follow Hyperbaric Oxygen Glycemia Protocol ?A frin (Oxymetazoline HCL) 0.05% nasal spray - 1 spray in both nostrils daily as needed prior to HBO treatment for difficulty clearing ears ?Wound Treatment ?Wound #1 - T Great ?oe Wound Laterality: Right, Medial ?Cleanser: Soap and Water 1 x Per Day/15 Days ?Discharge Instructions: May shower and wash wound with dial antibacterial soap and water prior to dressing change. ?Cleanser: Wound Cleanser (Generic) 1 x Per Day/15 Days ?Discharge Instructions: Cleanse the wound with wound cleanser prior to applying a clean dressing using gauze sponges, not tissue or cotton balls. ?Prim Dressing: Dakin's Solution 0.25%, 16 (oz) (Generic) 1 x Per Day/15 Days ?ary ?Discharge Instructions: Moisten gauze with Dakin's solution ?Secondary Dressing: Bordered Gauze, 4x4 in (Generic) 1 x Per Day/15 Days ?Discharge Instructions: Apply over primary dressing as directed. ?Secured With: Doctor, hospital, Sterile 2x75 (in/in) (Generic) 1 x Per Day/15 Days ?Discharge Instructions: Secure with stretch gauze as directed. ?Secured With: 56M Medipore H Soft Cloth Surgical T ape, 4 x 10 (in/yd) (Generic) 1 x P

## 2021-10-30 ENCOUNTER — Encounter (HOSPITAL_BASED_OUTPATIENT_CLINIC_OR_DEPARTMENT_OTHER): Payer: No Typology Code available for payment source | Admitting: Internal Medicine

## 2021-10-31 ENCOUNTER — Encounter (HOSPITAL_BASED_OUTPATIENT_CLINIC_OR_DEPARTMENT_OTHER): Payer: No Typology Code available for payment source | Attending: Internal Medicine | Admitting: Internal Medicine

## 2021-10-31 ENCOUNTER — Encounter (HOSPITAL_BASED_OUTPATIENT_CLINIC_OR_DEPARTMENT_OTHER): Payer: No Typology Code available for payment source | Admitting: Internal Medicine

## 2021-10-31 ENCOUNTER — Other Ambulatory Visit: Payer: Self-pay

## 2021-10-31 ENCOUNTER — Encounter: Payer: Self-pay | Admitting: Infectious Disease

## 2021-10-31 ENCOUNTER — Ambulatory Visit (INDEPENDENT_AMBULATORY_CARE_PROVIDER_SITE_OTHER): Payer: No Typology Code available for payment source | Admitting: Infectious Disease

## 2021-10-31 VITALS — BP 156/100 | HR 89 | Temp 98.6°F | Ht 69.0 in | Wt 188.0 lb

## 2021-10-31 DIAGNOSIS — F17200 Nicotine dependence, unspecified, uncomplicated: Secondary | ICD-10-CM

## 2021-10-31 DIAGNOSIS — E11621 Type 2 diabetes mellitus with foot ulcer: Secondary | ICD-10-CM | POA: Diagnosis not present

## 2021-10-31 DIAGNOSIS — I10 Essential (primary) hypertension: Secondary | ICD-10-CM | POA: Insufficient documentation

## 2021-10-31 DIAGNOSIS — E1169 Type 2 diabetes mellitus with other specified complication: Secondary | ICD-10-CM | POA: Diagnosis not present

## 2021-10-31 DIAGNOSIS — M86179 Other acute osteomyelitis, unspecified ankle and foot: Secondary | ICD-10-CM | POA: Diagnosis not present

## 2021-10-31 DIAGNOSIS — L84 Corns and callosities: Secondary | ICD-10-CM | POA: Diagnosis not present

## 2021-10-31 DIAGNOSIS — M86071 Acute hematogenous osteomyelitis, right ankle and foot: Secondary | ICD-10-CM | POA: Diagnosis not present

## 2021-10-31 DIAGNOSIS — L97514 Non-pressure chronic ulcer of other part of right foot with necrosis of bone: Secondary | ICD-10-CM | POA: Insufficient documentation

## 2021-10-31 DIAGNOSIS — Z7729 Contact with and (suspected ) exposure to other hazardous substances: Secondary | ICD-10-CM | POA: Insufficient documentation

## 2021-10-31 DIAGNOSIS — F4310 Post-traumatic stress disorder, unspecified: Secondary | ICD-10-CM | POA: Insufficient documentation

## 2021-10-31 DIAGNOSIS — M869 Osteomyelitis, unspecified: Secondary | ICD-10-CM

## 2021-10-31 DIAGNOSIS — A499 Bacterial infection, unspecified: Secondary | ICD-10-CM

## 2021-10-31 DIAGNOSIS — F32A Depression, unspecified: Secondary | ICD-10-CM | POA: Insufficient documentation

## 2021-10-31 HISTORY — DX: Bacterial infection, unspecified: A49.9

## 2021-10-31 HISTORY — DX: Post-traumatic stress disorder, unspecified: F43.10

## 2021-10-31 HISTORY — DX: Osteomyelitis, unspecified: M86.9

## 2021-10-31 MED ORDER — AMOXICILLIN-POT CLAVULANATE 875-125 MG PO TABS: 1.0000 | ORAL_TABLET | Freq: Two times a day (BID) | ORAL | 2 refills | Status: DC

## 2021-10-31 NOTE — Progress Notes (Signed)
? ?Subjective:  ?Reason for infectious disease consult: Diabetic foot ulcer with osteomyelitis of the great toe ? ?Questing physician: [name] ? ? Patient ID: [name], male    DOB: 10-Mar-1966, 56 y.o.   MRN: [id number] ? ?HPI ? ? ?[name] is a very pleasant veteran who has history of diabetes mellitus with neuropathy and retinopathy, history of small bowel obstruction PTSD gastroesophageal reflux disease hypertension cigar smoking who has been dealing with a wound in his right big toe for many years. ? ?He was having it debrided by one of the podiatrist at the [facility]. ? ?He had had several x-rays and an MRI even done this spring that it failed to show evidence of osteomyelitis. ? ?In the interim however he had experienced extreme swelling of his toe and was evaluated in an emergency department in [place].  Note shortly before his toe became grossly swollen and tender he had also had an MRI that showed osteomyelitis involving the first distal phalanx and first proximal phalanx on the right side. ? ?When he went to the emergency department apparently was gross pus coming from the wound which was cultured and grew Klebsiella enterology needs a group B streptococcus and a Prevotella species. ? ?Had been on clindamycin was changed to Augmentin.  Note the Klebsiella was resistant to cefazolin cefoxitin sensitive to Zosyn ceftriaxone ciprofloxacin and Bactrim. ? ?The Augmentin would not of been active against this organism that would have been against the group B strep in the Prevotella. ? ?Regardless he had dramatic improvement in the size of his toe which went down considerably. ? ?He has been seen by [name] with wound care and is following with her closely with improvement in the physical appearance of his toe he is also planned for hyperbaric oxygen therapy. ? ?He was referred to [place] for evaluation and treatment of his osteomyelitis. ? ? ? ?Past Medical History:  ?Diagnosis  Date  ? Arthritis   ? Diabetes mellitus   ? Fistula, perirectal   ? GERD (gastroesophageal reflux disease)   ? Hypertension   ? Osteomyelitis of great toe of right foot (HCC) 10/31/2021  ? PTSD (post-traumatic stress disorder)   ? Small bowel obstruction (HCC)   ? ? ?Past Surgical History:  ?Procedure Laterality Date  ? RECTAL SURGERY    ? ? ?No family history on file. ? ?  ?Social History  ? ?Socioeconomic History  ? Marital status: Married  ?  Spouse name: Not on file  ? Number of children: Not on file  ? Years of education: Not on file  ? Highest education level: Not on file  ?Occupational History  ? Not on file  ?Tobacco Use  ? Smoking status: Some Days  ?  Types: Cigars  ? Smokeless tobacco: Never  ?Vaping Use  ? Vaping Use: Never used  ?Substance and Sexual Activity  ? Alcohol use: Not Currently  ?  Comment: occ  ? Drug use: No  ? Sexual activity: Not on file  ?Other Topics Concern  ? Not on file  ?Social History Narrative  ? Not on file  ? ?Social Determinants of Health  ? ?Financial Resource Strain: Not on file  ?Food Insecurity: Not on file  ?Transportation Needs: Not on file  ?Physical Activity: Not on file  ?Stress: Not on file  ?Social Connections: Not on file  ? ? ?Allergies  ?Allergen Reactions  ? Lisinopril Other (See Comments)  ?  Other reaction(s): Electrocardiogram abnormal  ?  Sildenafil Nausea And Vomiting  ? ? ? ?Current Outpatient Medications:  ?  amLODipine (NORVASC) 10 MG tablet, Take 10 mg by mouth daily. (Patient not taking: Reported on 06/23/2021), Disp: , Rfl:  ?  amoxicillin-clavulanate (AUGMENTIN) 875-125 MG tablet, Take 1 tablet by mouth every 12 (twelve) hours. (Patient not taking: Reported on 06/23/2021), Disp: 14 tablet, Rfl: 0 ?  clindamycin (CLEOCIN) 300 MG capsule, Take 1 capsule (300 mg total) by mouth 4 (four) times daily. X 7 days, Disp: 28 capsule, Rfl: 0 ?  diphenhydrAMINE (BENADRYL) 25 MG tablet, Take 1 tablet (25 mg total) by mouth every 6 (six) hours as needed for itching or  allergies., Disp: 12 tablet, Rfl: 0 ?  docusate sodium (COLACE) 100 MG capsule, Take 1 capsule (100 mg total) by mouth every 12 (twelve) hours., Disp: 60 capsule, Rfl: 0 ?  Dulaglutide (TRULICITY [place]), Inject into the skin., Disp: , Rfl:  ?  escitalopram (LEXAPRO) 10 MG tablet, Take 10 mg by mouth daily., Disp: , Rfl:  ?  glipiZIDE (GLUCOTROL) 10 MG tablet, Take 1 tablet (10 mg total) by mouth 2 (two) times daily before a meal., Disp: 60 tablet, Rfl: 2 ?  HYDROCHLOROTHIAZIDE PO, Take by mouth., Disp: , Rfl:  ?  insulin aspart protamine- aspart (NOVOLOG MIX 70/30) (70-30) 100 UNIT/ML injection, Inject into the skin. (Patient not taking: Reported on 06/23/2021), Disp: , Rfl:  ?  lisinopril (PRINIVIL,ZESTRIL) 10 MG tablet, Take 10 mg by mouth daily., Disp: , Rfl:  ?  loratadine (CLARITIN) 10 MG tablet, TAKE 1 TABLET BY MOUTH ONCE DAILY, Disp: 90 tablet, Rfl: 30 ?  losartan (COZAAR) 100 MG tablet, Take 100 mg by mouth daily. (Patient not taking: Reported on 06/23/2021), Disp: , Rfl:  ?  OMEPRAZOLE PO, Take by mouth., Disp: , Rfl:  ?  oxyCODONE-acetaminophen (PERCOCET/ROXICET) 5-325 MG per tablet, Take 2 tablets by mouth every 4 (four) hours as needed for pain., Disp: 6 tablet, Rfl: 0 ?  polyethylene glycol (MIRALAX / GLYCOLAX) 17 g packet, Take 17 g by mouth daily., Disp: 14 each, Rfl: 1 ?  SUMAtriptan (IMITREX) 50 MG tablet, Take 50 mg by mouth every 2 (two) hours as needed for migraine or headache. May repeat in 2 hours if headache persists or recurs., Disp: , Rfl:  ?  testosterone cypionate (DEPOTESTOTERONE CYPIONATE) 100 MG/ML injection, Inject 100 mg into the muscle every 14 (fourteen) days. For IM use only, Disp: , Rfl:  ?  Zolpidem Tartrate (AMBIEN PO), Take by mouth., Disp: , Rfl:  ? ? ?Review of Systems  ?Constitutional:  Negative for activity change, appetite change, chills, diaphoresis, fatigue, fever and unexpected weight change.  ?HENT:  Negative for congestion, rhinorrhea, sinus pressure, sneezing, sore  throat and trouble swallowing.   ?Eyes:  Negative for photophobia and visual disturbance.  ?Respiratory:  Negative for cough, chest tightness, shortness of breath, wheezing and stridor.   ?Cardiovascular:  Negative for chest pain, palpitations and leg swelling.  ?Gastrointestinal:  Negative for abdominal distention, abdominal pain, anal bleeding, blood in stool, constipation, diarrhea, nausea and vomiting.  ?Genitourinary:  Negative for difficulty urinating, dysuria, flank pain and hematuria.  ?Musculoskeletal:  Negative for arthralgias, back pain, gait problem, joint swelling and myalgias.  ?Skin:  Positive for wound. Negative for color change, pallor and rash.  ?Neurological:  Negative for dizziness, tremors, weakness and light-headedness.  ?Hematological:  Negative for adenopathy. Does not bruise/bleed easily.  ?Psychiatric/Behavioral:  Negative for agitation, behavioral problems, confusion, decreased concentration, dysphoric mood and sleep  disturbance.   ? ?   ?Objective:  ? Physical Exam ?Constitutional:   ?   Appearance: He is well-developed.  ?HENT:  ?   Head: Normocephalic and atraumatic.  ?Eyes:  ?   Conjunctiva/sclera: Conjunctivae normal.  ?Cardiovascular:  ?   Rate and Rhythm: Normal rate and regular rhythm.  ?Pulmonary:  ?   Effort: Pulmonary effort is normal. No respiratory distress.  ?   Breath sounds: No wheezing.  ?Abdominal:  ?   General: There is no distension.  ?   Palpations: Abdomen is soft.  ?Musculoskeletal:     ?   General: No tenderness. Normal range of motion.  ?   Cervical back: Normal range of motion and neck supple.  ?Skin: ?   General: Skin is warm and dry.  ?   Coloration: Skin is not pale.  ?   Findings: No erythema or rash.  ?Neurological:  ?   General: No focal deficit present.  ?   Mental Status: He is alert and oriented to person, place, and time.  ?Psychiatric:     ?   Mood and Affect: Mood normal.     ?   Behavior: Behavior normal.     ?   Thought Content: Thought content  normal.     ?   Judgment: Judgment normal.  ? ? ?Great toe 10/31/2021: ? ? ? ? ? ? ?   ?Assessment & Plan:  ? ?Polymicrobial osteomyelitis of the great toe: Certainly not all the organisms isolated are likely

## 2021-11-01 ENCOUNTER — Encounter (HOSPITAL_BASED_OUTPATIENT_CLINIC_OR_DEPARTMENT_OTHER): Payer: No Typology Code available for payment source | Admitting: Physician Assistant

## 2021-11-02 ENCOUNTER — Encounter (HOSPITAL_BASED_OUTPATIENT_CLINIC_OR_DEPARTMENT_OTHER): Payer: No Typology Code available for payment source | Admitting: General Surgery

## 2021-11-02 NOTE — Progress Notes (Signed)
LOTANNA, PISCITELLI ([id number]) ?Visit Report for 10/31/2021 ?Arrival Information Details ?Patient Name: Date of Service: ?[name] [facility] 10/31/2021 1:45 PM ?Medical Record Number: [id number] ?Patient Account Number: [id number] ?Date of Birth/Sex: Treating RN: ?[birth year][name] (56 y.o. [name], [name] ?Primary Care [place]: [facility], [name] Other Clinician: ?Referring Brianca Fortenberry: ?Treating Henriette Hesser/Extender: [name] ?Clinic, [place] ?Weeks in Treatment: 1 ?Visit Information History Since Last Visit ?All ordered tests and consults were completed: Yes ?Patient Arrived: Ambulatory ?Added or deleted any medications: No ?Arrival Time: 14:[name] ?Any new allergies or adverse reactions: No ?Accompanied By: self ?Had a fall or experienced change in No ?Transfer Assistance: None ?activities of daily living that Christian affect ?Patient Identification Verified: Yes ?risk of falls: ?Secondary Verification Process Completed: Yes ?Signs or symptoms of abuse/neglect since last visito No ?Patient Requires Transmission-Based Precautions: No ?Hospitalized since last visit: No ?Patient Has Alerts: No ?Implantable device outside of the clinic excluding No ?cellular tissue based products placed in the center ?since last visit: ?Pain Present Now: No ?Electronic Signature(s) ?Signed: 11/01/2021 1:31:43 PM By: [name] CHT EMT BS ?, , ?Entered By: [name] on 10/31/2021 14:20:31 ?-------------------------------------------------------------------------------- ?Clinic Level of Care Assessment Details ?Patient Name: Date of Service: ?[name] [facility] 10/31/2021 1:45 PM ?Medical Record Number: [id number] ?Patient Account Number: [id number] ?Date of Birth/Sex: Treating RN: ?[name], [name] (56 y.o. [name] [name] ?Primary Care Yasmin [place]: [facility], [name] Other Clinician: ?Referring Neeka Urista: ?Treating Avram Danielson/Extender: [name] ?Clinic, [place] ?Weeks in Treatment: 1 ?Clinic Level of Care Assessment Items ?TOOL 4  Quantity Score ?X- 1 0 ?Use when only an EandM is performed on FOLLOW-UP visit ?ASSESSMENTS - Nursing Assessment / Reassessment ?X- 1 10 ?Reassessment of Co-morbidities (includes updates in patient status) ?X- 1 5 ?Reassessment of Adherence to Treatment Plan ?ASSESSMENTS - Wound and Skin A ssessment / Reassessment ?X - Simple Wound Assessment / Reassessment - one wound 1 5 ?[]  - 0 ?Complex Wound Assessment / Reassessment - multiple wounds ?[]  - 0 ?Dermatologic / Skin Assessment (not related to wound area) ?ASSESSMENTS - Focused Assessment ?[]  - 0 ?Circumferential Edema Measurements - multi extremities ?[]  - 0 ?Nutritional Assessment / Counseling / Intervention ?[]  - 0 ?Lower Extremity Assessment (monofilament, tuning fork, pulses) ?[]  - 0 ?Peripheral Arterial Disease Assessment (using hand held doppler) ?ASSESSMENTS - Ostomy and/or Continence Assessment and Care ?[]  - 0 ?Incontinence Assessment and Management ?[]  - 0 ?Ostomy Care Assessment and Management (repouching, etc.) ?PROCESS - Coordination of Care ?[]  - 0 ?Simple Patient / Family Education for ongoing care ?X- 1 20 ?Complex (extensive) Patient / Family Education for ongoing care ?X- 1 10 ?Staff obtains Consents, Records, T Results / Process Orders ?est ?[]  - 0 ?Staff telephones HHA, Nursing Homes / Clarify orders / etc ?[]  - 0 ?Routine Transfer to another Facility (non-emergent condition) ?[]  - 0 ?Routine Hospital Admission (non-emergent condition) ?[]  - 0 ?New Admissions / Manufacturing engineer / Ordering NPWT Apligraf, etc. ?, ?[]  - 0 ?Emergency Hospital Admission (emergent condition) ?[]  - 0 ?Simple Discharge Coordination ?[]  - 0 ?Complex (extensive) Discharge Coordination ?PROCESS - Special Needs ?[]  - 0 ?Pediatric / Minor Patient Management ?[]  - 0 ?Isolation Patient Management ?[]  - 0 ?Hearing / Language / Visual special needs ?[]  - 0 ?Assessment of Community assistance (transportation, D/C planning, etc.) ?[]  - 0 ?Additional assistance / Altered  mentation ?[]  - 0 ?Support Surface(s) Assessment (bed, cushion, seat, etc.) ?INTERVENTIONS - Wound Cleansing / Measurement ?X - Simple Wound Cleansing - one wound 1 5 ?[]  - 0 ?Complex Wound  Cleansing - multiple wounds ?X- 1 5 ?Wound Imaging (photographs - any number of wounds) ?[]  - 0 ?Wound Tracing (instead of photographs) ?X- 1 5 ?Simple Wound Measurement - one wound ?[]  - 0 ?Complex Wound Measurement - multiple wounds ?INTERVENTIONS - Wound Dressings ?X - Small Wound Dressing one or multiple wounds 1 10 ?[]  - 0 ?Medium Wound Dressing one or multiple wounds ?[]  - 0 ?Large Wound Dressing one or multiple wounds ?[]  - 0 ?Application of Medications - topical ?[]  - 0 ?Application of Medications - injection ?INTERVENTIONS - Miscellaneous ?[]  - 0 ?External ear exam ?[]  - 0 ?Specimen Collection (cultures, biopsies, blood, body fluids, etc.) ?[]  - 0 ?Specimen(s) / Culture(s) sent or taken to Lab for analysis ?[]  - 0 ?Patient Transfer (multiple staff / Nurse, adult / Similar devices) ?[]  - 0 ?Simple Staple / Suture removal ([name] or less) ?[]  - 0 ?Complex Staple / Suture removal (26 or more) ?[]  - 0 ?Hypo / Hyperglycemic Management (close monitor of Blood Glucose) ?[]  - 0 ?Ankle / Brachial Index (ABI) - do not check if billed separately ?X- 1 5 ?Vital Signs ?Has the patient been seen at the hospital within the last three years: Yes ?Total Score: 80 ?Level Of Care: New/Established - Level 3 ?Electronic Signature(s) ?Signed: 10/31/2021 5:29:35 PM By: Antonieta Iba ?Entered By: Antonieta Iba on 10/31/2021 15:07:56 ?-------------------------------------------------------------------------------- ?Encounter Discharge Information Details ?Patient Name: Date of Service: ?[name] [facility] 10/31/2021 1:45 PM ?Medical Record Number: [id number] ?Patient Account Number: [id number] ?Date of Birth/Sex: Treating RN: ?[name], [name] (56 y.o. [name] [name] ?Primary Care Gita Dil[place]: [facility], [name] Other Clinician: ?Referring Gokul Waybright: ?Treating  Delynn Pursley/Extender: [name] ?Clinic, [place] ?Weeks in Treatment: 1 ?Encounter Discharge Information Items ?Discharge Condition: Stable ?Ambulatory Status: Ambulatory ?Discharge Destination: Home ?Transportation: Private Auto ?Schedule Follow-up Appointment: Yes ?Clinical Summary of Care: Provided on 10/31/2021 ?Form Type Recipient ?Paper Patient Patient ?Electronic Signature(s) ?Signed: 10/31/2021 5:01:51 PM By: Antonieta Iba ?Entered By: Antonieta Iba on 10/31/2021 17:01:50 ?-------------------------------------------------------------------------------- ?Lower Extremity Assessment Details ?Patient Name: Date of Service: ?[name] [facility] 10/31/2021 1:45 PM ?Medical Record Number: [id number] ?Patient Account Number: [id number] ?Date of Birth/Sex: Treating RN: ?[name], [name] (56 y.o. [name], [name] ?Primary Care [place]: [facility], [name] Other Clinician: ?Referring Jt Brabec: ?Treating Scotlyn Mccranie/Extender: [name] ?Clinic, [place] ?Weeks in Treatment: 1 ?Edema Assessment ?Assessed: [Left: No] [Right: No] ?Edema: [Left: Ye] [Right: s] ?Calf ?Left: Right: ?Point of Measurement: 37 cm From Medial Instep 38.5 cm ?Ankle ?Left: Right: ?Point of Measurement: 9 cm From Medial Instep [name].7 cm ?Electronic Signature(s) ?Signed: 11/01/2021 1:31:43 PM By: [name] CHT EMT BS ?, , ?Signed: 11/02/2021 4:[name]:14 PM By: Fonnie Mu RN ?Entered By: [name] on 10/31/2021 14:24:07 ?-------------------------------------------------------------------------------- ?Multi Wound Chart Details ?Patient Name: ?Date of Service: ?[name] [facility] 10/31/2021 1:45 PM ?Medical Record Number: [id number] ?Patient Account Number: [id number] ?Date of Birth/Sex: ?Treating RN: ?[name] (56 y.o. [name], [name] ?Primary Care Dashanna Kinnamon: Clinic, [place] ?Other Clinician: ?Referring Claiborne Stroble: ?Treating Mignon Bechler/Extender: [name] ?Clinic, [place] ?Weeks in Treatment: 1 ?Vital  Signs ?Height(in): 69 ?Pulse(bpm): 97 ?Weight(lbs): 180 ?Blood Pressure(mmHg): 169/96 ?Body Mass Index(BMI): 26.6 ?Temperature(??F): 98.6 ?Respiratory Rate(breaths/min): 18 ?Photos: [N/A:N/A] ?Right, Medial T Great ?oe N/A

## 2021-11-02 NOTE — Progress Notes (Signed)
BERLIN, MOKRY ([id number]) ?Visit Report for 10/31/2021 ?Chief Complaint Document Details ?Patient Name: Date of Service: ?[name] [facility] 10/31/2021 1:45 PM ?Medical Record Number: [id number] ?Patient Account Number: [id number] ?Date of Birth/Sex: Treating RN: ?09-26-1965 (56 y.o. [name], [name] ?Primary Care Provider: [facility], [name] Other Clinician: ?Referring Provider: ?Treating Provider/Extender: [name] ?Clinic, [place] ?Weeks in Treatment: 1 ?Information Obtained from: Patient ?Chief Complaint ?10/19/2021; Right great toe wound ?Electronic Signature(s) ?Signed: 10/31/2021 3:13:59 PM By: [name] DO ?Entered By: [name] on 10/31/2021 15:09:25 ?-------------------------------------------------------------------------------- ?HPI Details ?Patient Name: Date of Service: ?[name] [facility] 10/31/2021 1:45 PM ?Medical Record Number: [id number] ?Patient Account Number: [id number] ?Date of Birth/Sex: Treating RN: ?Dec 04, 1965 (56 y.o. [name], [name] ?Primary Care Provider: [facility], [name] Other Clinician: ?Referring Provider: ?Treating Provider/Extender: [name] ?Clinic, [place] ?Weeks in Treatment: 1 ?History of Present Illness ?HPI Description: Admission 10/19/2021 ?Mr. 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 He had an MRI of the right foot on 10/16/2021 that showed signal changes in the first proximal and distal ?phalanx consistent with acute osteomyelitis. He states that he developed a wound spontaneously to this area 10 days ago. He does have significant peripheral ?neuropathy and does not have sensation to the wound area or bottom of his feet. He reports being on 3 different antibiotics including clindamycin and ?doxycycline in the past couple of weeks. He is currently on Augmentin based  on wound cultures done in the ED. He has currently been keeping the area ?covered. He currently denies systemic signs of infection. ?4/25; patient presents for follow-up. HBO orders have been placed and we are awaiting VA approval. He obtained his chest x-ray with no cardiopulmonary ?abnormalities. He has been using Dakin's wet-to-dry dressings to the toe wound. He currently denies systemic signs of infection. He is currently taking ?Augmentin. He has an infectious disease appointment on 5/2. He has not heard back about his ABIs with TBI's. ?5/2; patient presents for follow-up. He has been using Dakin's wet-to-dry dressings without issues. He continues to take Augmentin daily. He saw Dr. Algis Liming, ?infectious disease today. At this time Augmentin is being continued. He has not heard to schedule his ABIs with TBI's. He currently denies signs of infection. ?He has been approved for HBO treatment and will start in 1 week. ?Electronic Signature(s) ?Signed: 10/31/2021 3:13:59 PM By: [name] DO ?Entered By: [name] on 10/31/2021 15:10:47 ?-------------------------------------------------------------------------------- ?Physical Exam Details ?Patient Name: Date of Service: ?[name] [facility] 10/31/2021 1:45 PM ?Medical Record Number: [id number] ?Patient Account Number: [id number] ?Date of Birth/Sex: Treating RN: ?1965/09/14 (56 y.o. [name], [name] ?Primary Care Provider: [facility], [name] Other Clinician: ?Referring Provider: ?Treating Provider/Extender: [name] ?Clinic, [place] ?Weeks in Treatment: 1 ?Constitutional ?respirations regular, non-labored and within target range for patient.Marland Kitchen ?Cardiovascular ?2+ dorsalis pedis/posterior tibialis pulses. ?Psychiatric ?pleasant and cooperative. ?Notes ?Right foot: T the right great toe there is an open wound with mostly granulation tissue present. There are some nonviable tissue and undermining at the 9 ?o ?o'clock position. No surrounding signs  of infection. ?Electronic Signature(s) ?Signed: 10/31/2021 3:13:59 PM By: [name] DO ?Entered By: [name] on 10/31/2021 15:11:24 ?-------------------------------------------------------------------------------- ?Physician Orders Details ?Patient Name: Date of Service: ?[name] [facility] 10/31/2021 1:45 PM ?Medical Record Number: [id number] ?Patient Account Number: [id number] ?Date of Birth/Sex: Treating RN: ?17-Jun-1966 (  56 y.o. Lytle Michaels ?Primary Care Provider: [facility], [name] Other Clinician: ?Referring Provider: ?Treating Provider/Extender: [name] ?Clinic, [place] ?Weeks in Treatment: 1 ?Verbal / Phone Orders: No ?Diagnosis Coding ?Follow-up Appointments ?ppointment in 1 week. - Thursday 11/09/21 @ 3:15p with Dr. Mikey Bussing and Maryruth Bun, RN Room # 9 ?Return A ?Bathing/ Shower/ Hygiene ?May shower with protection but do not get wound dressing(s) wet. ?Edema Control - Lymphedema / SCD / Other ?Elevate legs to the level of the heart or above for 30 minutes daily and/or when sitting, a frequency of: ?Avoid standing for long periods of time. ?Moisturize legs daily. ?Off-Loading ?Other: - Keep pressure off of right foot ?Hyperbaric Oxygen Therapy ?Indication: - Wagner Grade 3/ + osteomyelitis Right Great Toe ?If appropriate for treatment, begin HBOT per protocol: ?2.0 ATA for 90 Minutes with 2 Five (5) Minute A Breaks ?ir ?Total Number of Treatments: - 40 ?One treatments per day (delivered Monday through Friday unless otherwise specified in Special Instructions below): ?Finger stick Blood Glucose Pre- and Post- HBOT Treatment. ?Follow Hyperbaric Oxygen Glycemia Protocol ?A frin (Oxymetazoline HCL) 0.05% nasal spray - 1 spray in both nostrils daily as needed prior to HBO treatment for difficulty clearing ears ?Wound Treatment ?Wound #1 - T Great ?oe Wound Laterality: Right, Medial ?Cleanser: Soap and Water 1 x Per Day/30 Days ?Discharge Instructions: May shower and wash wound with dial  antibacterial soap and water prior to dressing change. ?Cleanser: Wound Cleanser (Generic) 1 x Per Day/30 Days ?Discharge Instructions: Cleanse the wound with wound cleanser prior to applying a clean dressing using gauze sponges, not tissue or cotton balls. ?Prim Dressing: Dakin's Solution 0.25%, 16 (oz) (Generic) 1 x Per Day/30 Days ?ary ?Discharge Instructions: Moisten gauze with Dakin's solution ?Secondary Dressing: Bordered Gauze, 4x4 in (Generic) 1 x Per Day/30 Days ?Discharge Instructions: Apply over primary dressing as directed. ?Secured With: Insurance underwriter, Sterile 2x75 (in/in) (Generic) 1 x Per Day/30 Days ?Discharge Instructions: Secure with stretch gauze as directed. ?Secured With: 8M Medipore H Soft Cloth Surgical T ape, 4 x 10 (in/yd) (Generic) 1 x Per Day/30 Days ?Discharge Instructions: Secure with tape as directed. ?Services and Therapies ?rterial Studies- Bilateral - ABI/TBI bilateral legs ?A ?GLYCEMIA INTERVENTIONS PROTOCOL ?PRE-HBO GLYCEMIA INTERVENTIONS ?ACTION INTERVENTION ?Obtain pre-HBO capillary blood glucose (ensure ?1 ?physician order is in chart). ?A. Notify HBO physician and await physician ?orders. ?2 If result is 70 mg/dl or below: ?B. If the result meets the hospital definition of a ?critical result, follow hospital policy. ?A. Give patient an 8 ounce Glucerna Shake??, an ?8 ounce Ensure??, or 8 ounces of a ?Glucerna/Ensure equivalent dietary ?supplement*. ?B. Wait 30 minutes. ?If result is 71 mg/dl to 009 mg/dl: C. Retest patients capillary blood glucose ?(CBG). ?D. If result greater than or equal to 110 mg/dl, ?proceed with HBO. ?If result less than 110 mg/dl, notify HBO ?physician and consider holding HBO. ?If result is 131 mg/dl to 233 mg/dl: A. Proceed with HBO. ?A. Notify HBO physician and await physician ?orders. ?B. It is recommended to hold HBO and do ?If result is 250 mg/dl or greater: ?blood/urine ketone testing. ?C. If the result meets the hospital  definition of a ?critical result, follow hospital policy. ?POST-HBO GLYCEMIA INTERVENTIONS ?ACTION INTERVENTION ?Obtain post HBO capillary blood glucose (ensure ?1 ?physician order is in chart). ?A. Notify HBO

## 2021-11-03 ENCOUNTER — Encounter (HOSPITAL_BASED_OUTPATIENT_CLINIC_OR_DEPARTMENT_OTHER): Payer: No Typology Code available for payment source | Admitting: General Surgery

## 2021-11-06 ENCOUNTER — Encounter (HOSPITAL_BASED_OUTPATIENT_CLINIC_OR_DEPARTMENT_OTHER): Payer: No Typology Code available for payment source | Admitting: Internal Medicine

## 2021-11-06 DIAGNOSIS — M86071 Acute hematogenous osteomyelitis, right ankle and foot: Secondary | ICD-10-CM

## 2021-11-06 DIAGNOSIS — E11621 Type 2 diabetes mellitus with foot ulcer: Secondary | ICD-10-CM | POA: Diagnosis not present

## 2021-11-06 DIAGNOSIS — L97514 Non-pressure chronic ulcer of other part of right foot with necrosis of bone: Secondary | ICD-10-CM | POA: Diagnosis not present

## 2021-11-06 LAB — GLUCOSE, CAPILLARY
Glucose-Capillary: 354 mg/dL — ABNORMAL HIGH (ref 70–99)
Glucose-Capillary: 321 mg/dL — ABNORMAL HIGH (ref 70–99)

## 2021-11-06 NOTE — Progress Notes (Signed)
[name], [name] ([id number]) ?Visit Report for 11/06/2021 ?Arrival Information Details ?Patient Name: Date of Service: ?[name] [facility] 11/06/2021 1:00 PM ?Medical Record Number: [id number] ?Patient Account Number: [id number] ?Date of Birth/Sex: Treating RN: ?10/11/1965 (56 y.o. [name] ?Primary Care [name]: Clinic, [place] ?Other Clinician: [name] ?Referring [name]: ?Treating [name]/Extender: [name] ?Clinic, [place] ?Weeks in Treatment: 2 ?Visit Information History Since Last Visit ?All ordered tests and consults were completed: Yes ?Patient Arrived: Ambulatory ?Added or deleted any medications: No ?Arrival Time: 12:51 ?Any new allergies or adverse reactions: No ?Accompanied By: self ?Had a fall or experienced change in No ?Transfer Assistance: Manual ?activities of daily living that may affect ?Patient Identification Verified: Yes ?risk of falls: ?Secondary Verification Process Completed: Yes ?Signs or symptoms of abuse/neglect since last visito No ?Patient Requires Transmission-Based Precautions: No ?Hospitalized since last visit: No ?Patient Has Alerts: No ?Implantable device outside of the clinic excluding No ?cellular tissue based products placed in the center ?since last visit: ?Pain Present Now: No ?Electronic Signature(s) ?Signed: 11/06/2021 4:31:02 PM By: [name] CHT EMT BS ?, , ?Entered By: [name] on 11/06/2021 16:31:02 ?-------------------------------------------------------------------------------- ?Encounter Discharge Information Details ?Patient Name: Date of Service: ?[name] [facility] 11/06/2021 1:00 PM ?Medical Record Number: [id number] ?Patient Account Number: [id number] ?Date of Birth/Sex: Treating RN: ?February 17, 1966 (56 y.o. M) [name], Bobbi ?Primary Care [name]: Clinic, [place] ?Other Clinician: [name] ?Referring [name]: ?Treating [name]/Extender: [name] ?Clinic, [place] ?Weeks in Treatment: 2 ?Encounter  Discharge Information Items ?Discharge Condition: Stable ?Ambulatory Status: Ambulatory ?Discharge Destination: Home ?Transportation: Private Auto ?Accompanied By: self ?Schedule Follow-up Appointment: No ?Clinical Summary of Care: ?Electronic Signature(s) ?Signed: 11/06/2021 4:51:14 PM By: [name] CHT EMT BS ?, , ?Previous Signature: 11/06/2021 4:48:07 PM Version By: [name] CHT EMT BS ?, , ?Entered By: [name] on 11/06/2021 16:51:14 ?-------------------------------------------------------------------------------- ?Vitals Details ?Patient Name: ?Date of Service: ?[name] [facility] 11/06/2021 1:00 PM ?Medical Record Number: [id number] ?Patient Account Number: [id number] ?Date of Birth/Sex: ?Treating RN: ?July 26, 1965 (56 y.o. M) [name], Bobbi ?Primary Care [name]: Clinic, [place] ?Other Clinician: [name] ?Referring [name]: ?Treating [name]/Extender: [name] ?Clinic, [place] ?Weeks in Treatment: 2 ?Vital Signs ?Time Taken: 13:15 ?Temperature (??F): 98.1 ?Height (in): 69 ?Pulse (bpm): 92 ?Weight (lbs): 180 ?Respiratory Rate (breaths/min): 16 ?Body Mass Index (BMI): 26.6 ?Blood Pressure (mmHg): 156/94 ?Reference Range: 80 - 120 mg / dl ?Electronic Signature(s) ?Signed: 11/06/2021 4:32:52 PM By: [name] CHT EMT BS ?, , ?Entered By: [name] on 11/06/2021 16:32:52 ?

## 2021-11-06 NOTE — Progress Notes (Addendum)
[name], [name] ([id number]) ?Visit Report for 11/06/2021 ?HBO Details ?Patient Name: Date of Service: ?[name] [facility] 11/06/2021 1:00 PM ?Medical Record Number: [id number] ?Patient Account Number: [id number] ?Date of Birth/Sex: Treating RN: ?Aug 06, 1965 (56 y.o. M) [name], Bobbi ?Primary Care Kimyetta Flott: Clinic, [place] ?Other Clinician: [name] ?Referring Jamae Tison: ?Treating Abbigal Radich/Extender: [name] ?Clinic, [place] ?Weeks in Treatment: 2 ?HBO Treatment Course Details ?Treatment Course Number: 1 ?Ordering Sianna Garofano: [name] ?T Treatments Ordered: ?otal 40 HBO Treatment Start Date: 11/06/2021 ?HBO Indication: ?Diabetic Ulcer(s) of the Lower Extremity ?Standard/Conservative Wound Care tried and failed greater than or equal to ?30 days ?Wound #1 Right, Medial T Great ?oe ?HBO Treatment Details ?Treatment Number: 1 ?Patient Type: Outpatient ?Chamber Type: Monoplace ?Chamber Serial #: [id number] ?Treatment Protocol: 2.0 ATA with 90 minutes oxygen, with two 5 minute air breaks ?Treatment Details ?Compression Rate Down: 1.0 psi / minute ?De-Compression Rate Up: 1.0 psi / minute ?A breaks and breathing ?ir ?Compress Tx Pressure periods Decompress Decompress ?Begins Reached (leave unused spaces Begins Ends ?blank) ?Chamber Pressure (ATA 1 2 2 2 2 2  --2 1 ?) ?Clock Time (24 hr) 13:54 14:09 14:39 14:44 15:14 15:19 - - 15:50 16:01 ?Treatment Length: 127 (minutes) ?Treatment Segments: 4 ?Vital Signs ?Capillary Blood Glucose Reference Range: 80 - 120 mg / dl ?HBO Diabetic Blood Glucose Intervention Range: <131 mg/dl or >578 mg/dl ?Type: Time Vitals Blood ?Pulse: Respiratory Capillary Blood Glucose Pulse Action ?Temperature: ?Taken: ?Pressure: ?Rate: ?Glucose (mg/dl): ?Meter #: Oximetry (%) Taken: ?Pre 13:15 156/94 92 16 98.1 ?Post 16:05 170/99 85 16 98.1 321 2 reported to physician ?Pre 13:52 354 2 Reported to Physician ?Treatment Response ?Treatment Toleration: Well ?Treatment Completion Status:  Treatment Completed without Adverse Event ?Treatment Notes ?Patient was safely placed in chamber after performing safety check. 1319 Chamber was compressed at approximately 0.86 psi/min checking with patient ?about equalizing ears every 2 psi until reaching 2 ATA. 1336: It was discovered at 2 ATA that blood glucose level was not taken prior to treatment. At 1336 ?Patient was removed from chamber at 1.5 psi/min checking with patient frequently for ear equalization reaching surface at 1350, blood glucose measured at ?354 mg/dL. Measurement was reported to physician. Physician approved treatment. Treatment was restarted at 1354. Patient travelled at 1.0 psi/min during ?second compression phase. Patient tolerated treatment. Upon ending treatment, blood glucose was measured at 321 mg/dL. Glucose level reported to ?physician. ?Patient was not highly engaged with the importance of ear equalization. Educational reinforcement will be necessary. ?The cause of missed blood glucose measurement was identified and corrected. ?Dr. Virl Axe patient prior to his first dive. Clear tympanic membranes. Heart regular rate and rhythm. Lungs clear to auscultation. ?Additional Procedure Documentation ?Tissue Sevierity: Necrosis of bone ?Physician HBO Attestation: ?I certify that I supervised this HBO treatment in accordance with Medicare ?guidelines. A trained emergency response team is readily available per Yes ?hospital policies and procedures. ?Continue HBOT as ordered. Yes ?Electronic Signature(s) ?Signed: 11/08/2021 5:47:51 PM By: [name] CHT EMT BS ?, , ?Signed: 11/09/2021 11:59:40 AM By: [name] DO ?Previous Signature: 11/08/2021 5:47:20 PM Version By: [name] CHT EMT BS ?, , ?Previous Signature: 11/08/2021 4:04:18 PM Version By: [name] CHT EMT BS ?, , ?Previous Signature: 11/07/2021 3:21:24 PM Version By: [name] DO ?Previous Signature: 11/06/2021 4:46:45 PM Version By: [name] CHT EMT BS ?, , ?Entered By: [name] on 11/08/2021 17:47:51 ?-------------------------------------------------------------------------------- ?HBO Safety Checklist Details ?Patient Name: ?Date of Service: ?[name], [name] 11/06/2021 1:00  PM ?Medical Record Number: [id number] ?Patient Account Number: [id number] ?Date of Birth/Sex: ?Treating RN: ?January 09, 1966 (56 y.o. M) [name], Bobbi ?Primary Care Martasia Talamante: Clinic, [place] ?Other Clinician: [name] ?Referring Aleksei Goodlin: ?Treating Teaghan Melrose/Extender: [name] ?Clinic, [place] ?Weeks in Treatment: 2 ?HBO Safety Checklist Items ?Safety Checklist ?Consent Form Signed ?Patient voided / foley secured and emptied ?When did you last eato 1300 ?Last dose of injectable or oral agent 11/05/2021 ?Ostomy pouch emptied and vented if applicable ?NA ?All implantable devices assessed, documented and approved ?NA ?Intravenous access site secured and place ?NA ?Valuables secured ?Linens and cotton and cotton/polyester blend (less than 51% polyester) ?Personal oil-based products / skin lotions / body lotions removed ?Wigs or hairpieces removed ?NA ?Smoking or tobacco materials removed ?NA ?Books / newspapers / magazines / loose paper removed ?Cologne, aftershave, perfume and deodorant removed ?Jewelry removed (may wrap wedding band) ?Make-up removed ?NA ?Hair care products removed ?Battery operated devices (external) removed ?Heating patches and chemical warmers removed ?Titanium eyewear removed ?NA Glasses removed unsure of metal ?Nail polish cured greater than 10 hours ?NA ?Casting material cured greater than 10 hours ?NA ?Hearing aids removed ?NA ?Loose dentures or partials removed ?NA ?Prosthetics have been removed ?NA ?Patient demonstrates correct use of air break device (if applicable) ?Patient concerns have been addressed ?Patient grounding bracelet on and cord attached to chamber ?Specifics for Inpatients (complete in addition to  above) ?Medication sheet sent with patient ?NA ?Intravenous medications needed or due during therapy sent with patient ?NA ?Drainage tubes (e.g. nasogastric tube or chest tube secured and vented) ?NA ?Endotracheal or Tracheotomy tube secured ?NA ?[name] deflated of air and inflated with saline ?NA ?Airway suctioned ?NA ?Notes ?Paper version used prior to treatment. ?Electronic Signature(s) ?Signed: 11/06/2021 4:34:18 PM By: [name] CHT EMT BS ?, , ?Entered By: [name] on 11/06/2021 16:34:18 ?

## 2021-11-07 ENCOUNTER — Encounter (HOSPITAL_BASED_OUTPATIENT_CLINIC_OR_DEPARTMENT_OTHER): Payer: No Typology Code available for payment source | Admitting: Internal Medicine

## 2021-11-07 DIAGNOSIS — E11621 Type 2 diabetes mellitus with foot ulcer: Secondary | ICD-10-CM

## 2021-11-07 DIAGNOSIS — L97514 Non-pressure chronic ulcer of other part of right foot with necrosis of bone: Secondary | ICD-10-CM | POA: Diagnosis not present

## 2021-11-07 DIAGNOSIS — M86071 Acute hematogenous osteomyelitis, right ankle and foot: Secondary | ICD-10-CM | POA: Diagnosis not present

## 2021-11-07 LAB — GLUCOSE, CAPILLARY
Glucose-Capillary: 407 mg/dL — ABNORMAL HIGH (ref 70–99)
Glucose-Capillary: 393 mg/dL — ABNORMAL HIGH (ref 70–99)

## 2021-11-07 NOTE — Progress Notes (Signed)
[name], HARGADON ([id number]) ?Visit Report for 11/07/2021 ?Arrival Information Details ?Patient Name: Date of Service: ?[name] [facility] 11/07/2021 1:00 PM ?Medical Record Number: [id number] ?Patient Account Number: [id number] ?Date of Birth/Sex: Treating RN: ?08-24-1965 (56 y.o. M) [name], Bobbi ?Primary Care Kariel Skillman: Clinic, [place] ?Other Clinician: [name] ?Referring Kelsei Defino: ?Treating Benno Brensinger/Extender: [name] ?Clinic, [place] ?Weeks in Treatment: 2 ?Visit Information History Since Last Visit ?All ordered tests and consults were completed: Yes ?Patient Arrived: Ambulatory ?Added or deleted any medications: No ?Arrival Time: 12:50 ?Any new allergies or adverse reactions: No ?Accompanied By: self ?Had a fall or experienced change in No ?Transfer Assistance: None ?activities of daily living that may affect ?Patient Identification Verified: Yes ?risk of falls: ?Secondary Verification Process Completed: Yes ?Signs or symptoms of abuse/neglect since last visito No ?Patient Requires Transmission-Based Precautions: No ?Hospitalized since last visit: No ?Patient Has Alerts: No ?Implantable device outside of the clinic excluding No ?cellular tissue based products placed in the center ?since last visit: ?Pain Present Now: No ?Electronic Signature(s) ?Signed: 11/07/2021 4:31:48 PM By: [name] CHT EMT BS ?, , ?Entered By: [name] on 11/07/2021 16:31:48 ?-------------------------------------------------------------------------------- ?Patient/Caregiver Education Details ?Patient Name: Date of Service: ?WO FFO RDAlvino [name] HN 5/9/2023andnbsp1:00 PM ?Medical Record Number: [id number] ?Patient Account Number: [id number] ?Date of Birth/Gender: Treating RN: ?1965-08-27 (56 y.o. M) [name], Bobbi ?Primary Care Physician: Clinic, [employer] ?Other Clinician: [name] ?Referring Physician: ?Treating Physician/Extender: [name] ?Clinic, [place] ?Weeks in Treatment: 2 ?Education  Assessment ?Education Provided To: ?Patient ?Education Topics Provided ?Elevated Blood Sugar/ Impact on Healing: ?Methods: Explain/Verbal ?Responses: Reinforcements needed ?Electronic Signature(s) ?Signed: 11/07/2021 4:39:38 PM By: [name] CHT EMT BS ?, , ?Entered By: [name] on 11/07/2021 16:39:38 ?-------------------------------------------------------------------------------- ?Vitals Details ?Patient Name: ?Date of Service: ?[name] [facility] 11/07/2021 1:00 PM ?Medical Record Number: [id number] ?Patient Account Number: [id number] ?Date of Birth/Sex: ?Treating RN: ?24-Jan-1966 (56 y.o. M) [name], Bobbi ?Primary Care [name]: Clinic, [place] ?Other Clinician: [name] ?Referring  [name]: ?Treating [name]/Extender: [name] ?Clinic, [place] ?Weeks in Treatment: 2 ?Vital Signs ?Time Taken: 13:03 ?Temperature (??F): 98.4 ?Height (in): 69 ?Pulse (bpm): 101 ?Weight (lbs): 180 ?Respiratory Rate (breaths/min): 18 ?Body Mass Index (BMI): 26.6 ?Blood Pressure (mmHg): 142/84 ?Capillary Blood Glucose (mg/dl): 062 ?Reference Range: 80 - 120 mg / dl ?Electronic Signature(s) ?Signed: 11/07/2021 4:32:19 PM By: [name] CHT EMT BS ?, , ?Entered By: [name] on 11/07/2021 16:32:19 ?

## 2021-11-07 NOTE — Progress Notes (Addendum)
[name], [name] ([id number]) ?Visit Report for 11/06/2021 ?SuperBill Details ?Patient Name: Date of Service: ?[name] [facility] 11/06/2021 ?Medical Record Number: [id number] ?Patient Account Number: [id number] ?Date of Birth/Sex: Treating RN: ?[birth year] (56 y.o. 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, right ankle and foot ?Facility Procedures ?CPT4 Code Description Modifier Quantity ?78295621 G0277-(Facility Use Only) HBOT full body chamber, ?, 4 ?ICD-10 Diagnosis Description ?E11.621 Type 2 diabetes mellitus with foot ulcer ?L97.514 Non-pressure chronic ulcer of other part of right foot with necrosis of bone ?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

## 2021-11-07 NOTE — Progress Notes (Addendum)
DONAVON, GERHOLD ([id number]) ?Visit Report for 11/07/2021 ?HBO Details ?Patient Name: Date of Service: ?[name] [facility] 11/07/2021 1:00 PM ?Medical Record Number: [id number] ?Patient Account Number: [id number] ?Date of Birth/Sex: Treating RN: ?[birth year] (56 y.o. M) [name], Bobbi ?Primary Care Nirali Magouirk: Clinic, [place] ?Other Clinician: [name] ?Referring Elvin Mccartin: ?Treating Brizeyda Holtmeyer/Extender: [name] ?Clinic, [place] ?Weeks in Treatment: 2 ?HBO Treatment Course Details ?Treatment Course Number: 1 ?Ordering Kamyra Schroeck: [name] ?T Treatments Ordered: ?otal 40 HBO Treatment Start Date: 11/06/2021 ?HBO Indication: ?Diabetic Ulcer(s) of the Lower Extremity ?Standard/Conservative Wound Care tried and failed greater than or equal to ?30 days ?Wound #1 Right, Medial T Great ?oe ?HBO Treatment Details ?Treatment Number: 2 ?Patient Type: Outpatient ?Chamber Type: Monoplace ?Chamber Serial #: [id number] ?Treatment Protocol: 2.0 ATA with 90 minutes oxygen, with two 5 minute air breaks ?Treatment Details ?Compression Rate Down: 1.0 psi / minute ?De-Compression Rate Up: 1.0 psi / minute ?A breaks and breathing ?ir ?Compress Tx Pressure periods Decompress Decompress ?Begins Reached (leave unused spaces Begins Ends ?blank) ?Chamber Pressure (ATA 1 2 2 2 2 2  --2 1 ?) ?Clock Time (24 hr) 13:09 13:27 13:58 14:03 14:33 14:38 - - 15:08 15:22 ?Treatment Length: 133 (minutes) ?Treatment Segments: 4 ?Vital Signs ?Capillary Blood Glucose Reference Range: 80 - 120 mg / dl ?HBO Diabetic Blood Glucose Intervention Range: <131 mg/dl or >253 mg/dl ?Type: Time Vitals Blood Pulse: Respiratory Temperature: Capillary Blood Glucose Pulse Action ?Taken: Pressure: Rate: Glucose (mg/dl): Meter #: Oximetry (%) Taken: ?Pre 13:03 142/84 101 18 98.4 407 2 alerted physician to blood glucose level ?Post 15:30 162/98 98 16 98.2 393 2 alerted physician to blood glucose level ?Treatment Response ?Treatment Toleration: Well ?Treatment  Completion Status: Treatment Completed without Adverse Event ?Additional Procedure Documentation ?Tissue Sevierity: Necrosis of bone ?Physician HBO Attestation: ?I certify that I supervised this HBO treatment in accordance with Medicare ?guidelines. A trained emergency response team is readily available per Yes ?hospital policies and procedures. ?Continue HBOT as ordered. Yes ?Electronic Signature(s) ?Signed: 11/08/2021 12:44:21 PM By: [name] DO ?Previous Signature: 11/07/2021 4:36:54 PM Version By: [name] CHT EMT BS ?, , ?Entered By: [name] on 11/08/2021 12:41:42 ?-------------------------------------------------------------------------------- ?HBO Safety Checklist Details ?Patient Name: ?Date of Service: ?[name] [facility] 11/07/2021 1:00 PM ?Medical Record Number: [id number] ?Patient Account Number: [id number] ?Date of Birth/Sex: ?Treating RN: ?1966-07-02 (56 y.o. M) [name], Bobbi ?Primary Care Korinne Greenstein: Clinic, [place] ?Other Clinician: [name] ?Referring Illeana Edick: ?Treating Janeliz Prestwood/Extender: [name] ?Clinic, [place] ?Weeks in Treatment: 2 ?HBO Safety Checklist Items ?Safety Checklist ?Consent Form Signed ?Patient voided / foley secured and emptied ?When did you last eato 1230 ?Last dose of injectable or oral agent 11/05/2021 ?Ostomy pouch emptied and vented if applicable ?NA ?All implantable devices assessed, documented and approved ?NA ?Intravenous access site secured and place ?NA ?Valuables secured ?Linens and cotton and cotton/polyester blend (less than 51% polyester) ?Personal oil-based products / skin lotions / body lotions removed ?Wigs or hairpieces removed ?NA ?Smoking or tobacco materials removed ?NA ?Books / newspapers / magazines / loose paper removed ?Cologne, aftershave, perfume and deodorant removed ?Jewelry removed (may wrap wedding band) ?Make-up removed ?NA ?Hair care products removed ?Battery operated devices (external) removed ?Heating  patches and chemical warmers removed ?Titanium eyewear removed ?Nail polish cured greater than 10 hours ?NA ?Casting material cured greater than 10 hours ?NA ?Hearing aids removed ?NA ?Loose dentures or partials removed ?NA ?Prosthetics have been removed ?NA ?Patient demonstrates correct use of air break device (if applicable) ?  Patient concerns have been addressed ?Patient grounding bracelet on and cord attached to chamber ?Specifics for Inpatients (complete in addition to above) ?Medication sheet sent with patient ?NA ?Intravenous medications needed or due during therapy sent with patient ?NA ?Drainage tubes (e.g. nasogastric tube or chest tube secured and vented) ?NA ?Endotracheal or Tracheotomy tube secured ?NA ?Cuff deflated of air and inflated with saline ?NA ?Airway suctioned ?NA ?Notes ?Paper version used prior to treatment. ?Electronic Signature(s) ?Signed: 11/07/2021 4:33:53 PM By: [name] CHT EMT BS ?, , ?Entered By: [name] on 11/07/2021 16:33:52 ?

## 2021-11-08 ENCOUNTER — Encounter (HOSPITAL_BASED_OUTPATIENT_CLINIC_OR_DEPARTMENT_OTHER): Payer: No Typology Code available for payment source | Admitting: General Surgery

## 2021-11-08 DIAGNOSIS — E11621 Type 2 diabetes mellitus with foot ulcer: Secondary | ICD-10-CM | POA: Diagnosis not present

## 2021-11-08 LAB — GLUCOSE, CAPILLARY
Glucose-Capillary: 333 mg/dL — ABNORMAL HIGH (ref 70–99)
Glucose-Capillary: 279 mg/dL — ABNORMAL HIGH (ref 70–99)

## 2021-11-08 NOTE — Progress Notes (Signed)
VERMON, GRAYS ([id number]) ?Visit Report for 11/08/2021 ?Problem List Details ?Patient Name: Date of Service: ?[name] [name] 11/08/2021 1:00 PM ?Medical Record Number: [id number] ?Patient Account Number: [id number] ?Date of Birth/Sex: Treating RN: ?July 02, 1966 (56 y.o. 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, right ankle and foot ?Facility Procedures ?CPT4 Code: 76546503 ?Description: G0277-(Facility Use Only) HBOT full body chamber, , ICD-10 Diagnosis Description E11.621 Type 2 diabetes mellitus with foot ulcer L97.514  Non-pressure chronic ulcer of other part of right foot with necrosis of bone M86.071 Acute  ?hematogenous osteomyelitis, right ankle and foot ?Modifier: ?Quantity: 4 ?Physician Procedures ?: CPT4 Code Description Modifier [id number] - WC PHYS HYPERBARIC OXYGEN THERAPY ICD-10 Diagnosis Description E11.621 Type 2 diabetes mellitus with foot ulcer L97.514 Non-pressure chronic ulcer of other part of right foot with necrosis of bone M86.071  ?Acute hematogenous osteomyelitis, right ankle and foot ?Quantity: 1 ?Electronic Signature(s) ?Signed: 11/08/2021 4:08:39 PM By: [name] EMT ?Signed: 11/08/2021 5:19:03 PM By: [name] MD FACS ?Entered By: [name] on 11/08/2021 16:08:38 ?

## 2021-11-08 NOTE — Progress Notes (Addendum)
DOMNICK, ITKIN ([id number]) ?Visit Report for 11/08/2021 ?HBO Details ?Patient Name: Date of Service: ?[name] [facility] 11/08/2021 1:00 PM ?Medical Record Number: [id number] ?Patient Account Number: [id number] ?Date of Birth/Sex: Treating RN: ?Mar 29, 1966 (56 y.o. [name] ?Primary Care Sanayah Munro: Clinic, [place] ?Other Clinician: [name] ?Referring Soo Steelman: ?Treating Olander Friedl/Extender: [name] ?Clinic, [place] ?Weeks in Treatment: 2 ?HBO Treatment Course Details ?Treatment Course Number: 1 ?Ordering Elwyn Klosinski: [name] ?T Treatments Ordered: ?otal 40 HBO Treatment Start Date: 11/06/2021 ?HBO Indication: ?Diabetic Ulcer(s) of the Lower Extremity ?Standard/Conservative Wound Care tried and failed greater than or equal to ?30 days ?Wound #1 Right, Medial T Great ?oe ?HBO Treatment Details ?Treatment Number: 3 ?Patient Type: Outpatient ?Chamber Type: Monoplace ?Chamber Serial #: [id number] ?Treatment Protocol: 2.0 ATA with 90 minutes oxygen, with two 5 minute air breaks ?Treatment Details ?Compression Rate Down: 1.0 psi / minute ?De-Compression Rate Up: 1.0 psi / minute ?A breaks and breathing ?ir ?Compress Tx Pressure periods Decompress Decompress ?Begins Reached (leave unused spaces Begins Ends ?blank) ?Chamber Pressure (ATA 1 2 2 2 2 2  --2 1 ?) ?Clock Time (24 hr) 13:00 13:16 13:47 13:52 14:22 14:27 - - 14:57 15:10 ?Treatment Length: 130 (minutes) ?Treatment Segments: 4 ?Vital Signs ?Capillary Blood Glucose Reference Range: 80 - 120 mg / dl ?HBO Diabetic Blood Glucose Intervention Range: <131 mg/dl or >630 mg/dl ?Time Vitals Blood Respiratory Capillary Blood Glucose Pulse Action ?Type: ?Pulse: Temperature: ?Taken: ?Pressure: ?Rate: ?Glucose (mg/dl): ?Meter #: Oximetry (%) Taken: ?Pre 12:46 126/86 96 20 98.2 333 ?Post 15:19 157/88 85 18 97.9 279 ?Pre-Treatment Ear Evaluation ?Left ?Left T Scale: ?eed Grade 0 ?Treatment Response ?Treatment Toleration: Well ?Treatment Completion Status:  Treatment Completed without Adverse Event ?Treatment Notes ?No problems were noted today during his treatment. ?After his treatment, I asked him if he had any problems with his ears or sinus. He said no. ?Additional Procedure Documentation ?Tissue Sevierity: Necrosis of bone ?Physician HBO Attestation: ?I certify that I supervised this HBO treatment in accordance with Medicare ?guidelines. A trained emergency response team is readily available per Yes ?hospital policies and procedures. ?Continue HBOT as ordered. Yes ?Electronic Signature(s) ?Signed: 11/08/2021 5:20:00 PM By: [name] MD FACS ?Previous Signature: 11/08/2021 4:08:10 PM Version By: [name] EMT ?Previous Signature: 11/08/2021 2:23:30 PM Version By: [name] MD FACS ?Previous Signature: 11/08/2021 1:31:24 PM Version By: [name] CHT EMT BS ?, , ?Previous Signature: 11/08/2021 1:28:44 PM Version By: [name] CHT EMT BS ?, , ?Entered By: [name] on 11/08/2021 17:20:00 ?-------------------------------------------------------------------------------- ?HBO Safety Checklist Details ?Patient Name: ?Date of Service: ?[name] [facility] 11/08/2021 1:00 PM ?Medical Record Number: [id number] ?Patient Account Number: [id number] ?Date of Birth/Sex: ?Treating RN: ?May 10, 1966 (56 y.o. [name] ?Primary Care Anetra Czerwinski: Clinic, [place] ?Other Clinician: [name] ?Referring Cicilia Clinger: ?Treating Azzure Garabedian/Extender: [name] ?Clinic, [place] ?Weeks in Treatment: 2 ?HBO Safety Checklist Items ?Safety Checklist ?Consent Form Signed ?Patient voided / foley secured and emptied ?When did you last eato Yesterday evening ?Last dose of injectable or oral agent 11/05/2021 ?Ostomy pouch emptied and vented if applicable ?NA ?All implantable devices assessed, documented and approved ?NA ?Intravenous access site secured and place ?NA ?Valuables secured ?Linens and cotton and cotton/polyester blend (less than 51%  polyester) ?Personal oil-based products / skin lotions / body lotions removed ?Wigs or hairpieces removed ?NA ?Smoking or tobacco materials removed ?NA ?Books / newspapers / magazines / loose paper removed ?Cologne, aftershave, perfume and deodorant removed ?Jewelry removed (may wrap wedding band) ?  Make-up removed ?NA ?Hair care products removed ?Battery operated devices (external) removed ?Heating patches and chemical warmers removed ?Titanium eyewear removed ?Nail polish cured greater than 10 hours ?NA ?Casting material cured greater than 10 hours ?NA ?Hearing aids removed ?NA ?Loose dentures or partials removed ?NA ?Prosthetics have been removed ?NA ?Patient demonstrates correct use of air break device (if applicable) ?Patient concerns have been addressed ?Patient grounding bracelet on and cord attached to chamber ?Specifics for Inpatients (complete in addition to above) ?Medication sheet sent with patient ?NA ?Intravenous medications needed or due during therapy sent with patient ?NA ?Drainage tubes (e.g. nasogastric tube or chest tube secured and vented) ?NA ?Endotracheal or Tracheotomy tube secured ?NA ?[name] deflated of air and inflated with saline ?NA ?Airway suctioned ?NA ?Notes ?Paper version used prior to treatment. ?Electronic Signature(s) ?Signed: 11/08/2021 1:26:38 PM By: [name] CHT EMT BS ?, , ?Entered By: [name] on 11/08/2021 13:26:38 ?

## 2021-11-08 NOTE — Progress Notes (Addendum)
TRINTON, PREWITT ([id number]) ?Visit Report for 11/08/2021 ?Arrival Information Details ?Patient Name: Date of Service: ?[name] [facility] 11/08/2021 1:00 PM ?Medical Record Number: [id number] ?Patient Account Number: [id number] ?Date of Birth/Sex: Treating RN: ?01/30/1966 (56 y.o. [name] ?Primary Care Acire Tang: Clinic, [place] ?Other Clinician: [name] ?Referring Journee Bobrowski: ?Treating Jami Ohlin/Extender: [name] ?Clinic, [place] ?Weeks in Treatment: 2 ?Visit Information History Since Last Visit ?All ordered tests and consults were completed: Yes ?Patient Arrived: Ambulatory ?Added or deleted any medications: No ?Arrival Time: 12:45 ?Any new allergies or adverse reactions: No ?Accompanied By: self ?Had a fall or experienced change in No ?Transfer Assistance: None ?activities of daily living that may affect ?Patient Identification Verified: Yes ?risk of falls: ?Secondary Verification Process Completed: Yes ?Signs or symptoms of abuse/neglect since last visito No ?Patient Requires Transmission-Based Precautions: No ?Hospitalized since last visit: No ?Patient Has Alerts: No ?Implantable device outside of the clinic excluding No ?cellular tissue based products placed in the center ?since last visit: ?Pain Present Now: No ?Electronic Signature(s) ?Signed: 11/08/2021 1:23:12 PM By: [name] CHT EMT BS ?, , ?Entered By: [name] on 11/08/2021 13:23:12 ?-------------------------------------------------------------------------------- ?Encounter Discharge Information Details ?Patient Name: Date of Service: ?[name] [facility] 11/08/2021 1:00 PM ?Medical Record Number: [id number] ?Patient Account Number: [id number] ?Date of Birth/Sex: Treating RN: ?[birth year] (56 y.o. M) ?Primary Care Hartford Maulden: [name] Other Clinician: ?Referring Mercer Peifer: ?Treating Auburn Hester/Extender: [name] ?Clinic, [place] ?Weeks in Treatment: 2 ?Encounter Discharge Information  Items ?Discharge Condition: Stable ?Ambulatory Status: Ambulatory ?Discharge Destination: Home ?Transportation: Private Auto ?Accompanied By: None ?Schedule Follow-up Appointment: No ?Clinical Summary of Care: ?Electronic Signature(s) ?Signed: 11/08/2021 4:10:00 PM By: [name] EMT ?Entered By: [name] on 11/08/2021 16:10:00 ?-------------------------------------------------------------------------------- ?Vitals Details ?Patient Name: ?Date of Service: ?[name] [facility] 11/08/2021 1:00 PM ?Medical Record Number: [id number] ?Patient Account Number: [id number] ?Date of Birth/Sex: ?Treating RN: ?20-Nov-1965 (56 y.o. [name] ?Primary Care Shaya Reddick: Clinic, [place] ?Other Clinician: [name] ?Referring Kenzlei Runions: ?Treating Temesha Queener/Extender: [name] ?Clinic, [place] ?Weeks in Treatment: 2 ?Vital Signs ?Time Taken: 12:46 ?Temperature (??F): 98.2 ?Height (in): 69 ?Pulse (bpm): 96 ?Weight (lbs): 180 ?Respiratory Rate (breaths/min): 20 ?Body Mass Index (BMI): 26.6 ?Blood Pressure (mmHg): 126/86 ?Capillary Blood Glucose (mg/dl): 917 ?Reference Range: 80 - 120 mg / dl ?Electronic Signature(s) ?Signed: 11/08/2021 1:25:14 PM By: [name] CHT EMT BS ?, , ?Entered By: [name] on 11/08/2021 13:25:14 ?

## 2021-11-08 NOTE — Progress Notes (Signed)
[name], [name] ([id number]) ?Visit Report for 11/07/2021 ?SuperBill Details ?Patient Name: Date of Service: ?[name] 11/07/2021 ?Medical Record Number: [id number] ?Patient Account Number: [id number] ?Date of Birth/Sex: Treating RN: ?1965-07-23 (57 y.o. 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, right ankle and foot ?Facility Procedures ?CPT4 Code Description Modifier Quantity ?[id number] G0277-(Facility Use Only) HBOT full body chamber, 66min ?, 4 ?ICD-10 Diagnosis Description ?E11.621 Type 2 diabetes mellitus with foot ulcer ?L97.514 Non-pressure chronic ulcer of other part of right foot with necrosis of bone ?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

## 2021-11-09 ENCOUNTER — Other Ambulatory Visit (HOSPITAL_BASED_OUTPATIENT_CLINIC_OR_DEPARTMENT_OTHER): Payer: Self-pay | Admitting: Podiatry

## 2021-11-09 ENCOUNTER — Encounter (HOSPITAL_BASED_OUTPATIENT_CLINIC_OR_DEPARTMENT_OTHER): Payer: No Typology Code available for payment source | Admitting: General Surgery

## 2021-11-09 ENCOUNTER — Encounter (HOSPITAL_BASED_OUTPATIENT_CLINIC_OR_DEPARTMENT_OTHER): Payer: No Typology Code available for payment source | Admitting: Internal Medicine

## 2021-11-09 ENCOUNTER — Encounter (HOSPITAL_COMMUNITY): Payer: No Typology Code available for payment source

## 2021-11-09 DIAGNOSIS — L97514 Non-pressure chronic ulcer of other part of right foot with necrosis of bone: Secondary | ICD-10-CM | POA: Diagnosis not present

## 2021-11-09 DIAGNOSIS — L089 Local infection of the skin and subcutaneous tissue, unspecified: Secondary | ICD-10-CM

## 2021-11-09 DIAGNOSIS — E11621 Type 2 diabetes mellitus with foot ulcer: Secondary | ICD-10-CM | POA: Diagnosis not present

## 2021-11-10 ENCOUNTER — Encounter (HOSPITAL_BASED_OUTPATIENT_CLINIC_OR_DEPARTMENT_OTHER): Payer: No Typology Code available for payment source | Admitting: General Surgery

## 2021-11-10 DIAGNOSIS — E11621 Type 2 diabetes mellitus with foot ulcer: Secondary | ICD-10-CM | POA: Diagnosis not present

## 2021-11-10 LAB — GLUCOSE, CAPILLARY
Glucose-Capillary: 435 mg/dL — ABNORMAL HIGH (ref 70–99)
Glucose-Capillary: 342 mg/dL — ABNORMAL HIGH (ref 70–99)

## 2021-11-10 NOTE — Progress Notes (Signed)
[facility] Note  11/17/2021     CHIEF COMPLAINT Patient presents for Retina Follow Up   HISTORY OF PRESENT ILLNESS: [name] is a 56 y.o. male who presents to the clinic today for:   HPI     Retina Follow Up   Patient presents with  Diabetic Retinopathy.  In both eyes.  This started 4 weeks ago.  I, the attending physician,  performed the HPI with the patient and updated documentation appropriately.        Comments   Patient here for 4 weeks retina follow up for NPDR OU. Patient states vision is getting better no eye pain. Put back on insulin. Will bring name of medicine next visit. Doing the hyper baric chamber.      Last edited by Bernarda Caffey, MD on 11/20/2021 12:54 PM.     Pt states vision is improving, pt states his blood sugar numbers have gone up, he just started a new insulin today   Referring physician: [facility], [facility] [address] [place],  [place] [address]  HISTORICAL INFORMATION:   Selected notes from the MEDICAL RECORD NUMBER Referred by [facility] for retinal hemes LEE:  Ocular Hx- PMH-    CURRENT MEDICATIONS: No current outpatient medications on file. (Ophthalmic Drugs)   No current facility-administered medications for this visit. (Ophthalmic Drugs)   Current Outpatient Medications (Other)  Medication Sig   amLODipine (NORVASC) 10 MG tablet Take 10 mg by mouth daily.   diphenhydrAMINE (BENADRYL) 25 MG tablet Take 1 tablet (25 mg total) by mouth every 6 (six) hours as needed for itching or allergies.   HYDROCHLOROTHIAZIDE PO Take by mouth.   oxyCODONE-acetaminophen (PERCOCET/ROXICET) 5-325 MG per tablet Take 2 tablets by mouth every 4 (four) hours as needed for pain.   polyethylene glycol (MIRALAX / GLYCOLAX) 17 g packet Take 17 g by mouth daily.   Semaglutide (OZEMPIC, 1 MG/DOSE, [place]) Inject 1 mg into the skin once a week.   SUMAtriptan (IMITREX) 50 MG tablet Take 50 mg by mouth every 2 (two)  hours as needed for migraine or headache. May repeat in 2 hours if headache persists or recurs.   testosterone cypionate (DEPOTESTOTERONE CYPIONATE) 100 MG/ML injection Inject 100 mg into the muscle every 14 (fourteen) days. For IM use only   Zolpidem Tartrate (AMBIEN PO) Take by mouth.   amoxicillin-clavulanate (AUGMENTIN) 875-125 MG tablet Take 1 tablet by mouth every 12 (twelve) hours.   docusate sodium (COLACE) 100 MG capsule Take 1 capsule (100 mg total) by mouth every 12 (twelve) hours. (Patient not taking: Reported on 10/31/2021)   Dulaglutide (TRULICITY [place]) Inject into the skin. (Patient not taking: Reported on 10/31/2021)   escitalopram (LEXAPRO) 10 MG tablet Take 10 mg by mouth daily. (Patient not taking: Reported on 10/31/2021)   glipiZIDE (GLUCOTROL) 10 MG tablet Take 1 tablet (10 mg total) by mouth 2 (two) times daily before a meal. (Patient not taking: Reported on 10/31/2021)   insulin aspart protamine- aspart (NOVOLOG MIX 70/30) (70-30) 100 UNIT/ML injection Inject into the skin. (Patient not taking: Reported on 06/23/2021)   lisinopril (PRINIVIL,ZESTRIL) 10 MG tablet Take 10 mg by mouth daily. (Patient not taking: Reported on 10/31/2021)   loratadine (CLARITIN) 10 MG tablet TAKE 1 TABLET BY MOUTH ONCE DAILY   losartan (COZAAR) 100 MG tablet Take 100 mg by mouth daily. (Patient not taking: Reported on 06/23/2021)   OMEPRAZOLE PO Take by mouth. (Patient not taking: Reported on 10/31/2021)  No current facility-administered medications for this visit. (Other)   REVIEW OF SYSTEMS: ROS   Positive for: Endocrine, Eyes Negative for: Constitutional, Gastrointestinal, Neurological, Skin, Genitourinary, Musculoskeletal, HENT, Cardiovascular, Respiratory, Psychiatric, Allergic/Imm, Heme/Lymph Last edited by [name], COA on 11/17/2021  9:16 AM.     ALLERGIES Allergies  Allergen Reactions   Lisinopril Other (See Comments)    Other reaction(s): Electrocardiogram abnormal   Sildenafil  Nausea And Vomiting   PAST MEDICAL HISTORY Past Medical History:  Diagnosis Date   Arthritis    Diabetes mellitus    Fistula, perirectal    GERD (gastroesophageal reflux disease)    Hypertension    Osteomyelitis of great toe of right foot ([place]) 10/31/2021   Polymicrobial bacterial infection 10/31/2021   PTSD (post-traumatic stress disorder)    PTSD (post-traumatic stress disorder) 10/31/2021   Small bowel obstruction (HCC)    Past Surgical History:  Procedure Laterality Date   RECTAL SURGERY     FAMILY HISTORY History reviewed. No pertinent family history.  SOCIAL HISTORY Social History   Tobacco Use   Smoking status: Some Days    Types: Cigars   Smokeless tobacco: Never   Tobacco comments:    Occasional cigar  Vaping Use   Vaping Use: Never used  Substance Use Topics   Alcohol use: Not Currently    Comment: occ   Drug use: No       OPHTHALMIC EXAM:  Base Eye Exam     Visual Acuity (Snellen - Linear)       Right Left   Dist cc 20/100 +2 20/40 -2   Dist ph cc NI 20/30 -2    Correction: Glasses         Tonometry (Tonopen, 9:13 AM)       Right Left   Pressure 16 13         Pupils       Dark Light Shape React APD   Right 3 2 Round Minimal None   Left 3 2 Round Minimal None         Visual Fields (Counting fingers)       Left Right    Full Full         Extraocular Movement       Right Left    Full, Ortho Full, Ortho         Neuro/Psych     Oriented x3: Yes   Mood/Affect: Normal         Dilation     Both eyes: 1.0% Mydriacyl, 2.5% Phenylephrine @ 9:13 AM           Slit Lamp and Fundus Exam     Slit Lamp Exam       Right Left   Lids/Lashes Dermatochalasis - upper lid Dermatochalasis - upper lid   Conjunctiva/Sclera mild melanosis, nasal pingeucula mild melanosis, nasal and temporal pinguecula   Cornea arcus, trace PEE, trace tear film debris arcus, trace PEE, mild tear film debris   Anterior Chamber deep, clear,  narrow temporal angle Deep and quiet   Iris Round and dilated, No NVI Round and dilated, No NVI   Lens 2+ Nuclear sclerosis, 2+ Cortical cataract 2-3+ Nuclear sclerosis, 2-3+ Cortical cataract   Anterior Vitreous Vitreous syneresis, mild blood stained vitreous condensations - improved Vitreous syneresis         Fundus Exam       Right Left   Disc Pink and Sharp, no NVD, Compact Pink and Sharp, no NVD  C/D Ratio 0.2 0.2   Macula Flat, good foveal reflex, scattered MA/DBH, CWS SN / IT mac Blunted foveal reflex, central edema with cluster of DBH temporal fovea - improved, scattered DBH and CWS temporal mac   Vessels attenuated, Tortuous attenuated, Tortuous, mild Copper wiring   Periphery Attached, scattered DBH and CWS posteriorly Attached, scattered DBH and CWS posteriorly           Refraction     Wearing Rx       Sphere Cylinder Axis Add   Right +2.25 +0.50 165 +2.00   Left +2.25 +0.75 172 +2.00            IMAGING AND PROCEDURES  Imaging and Procedures for 11/17/2021  Glucose, capillary      Component Value Flag Ref Range Units Status   Glucose-Capillary 337      70 - 99 mg/dL Final   Comment:   Glucose reference range applies only to samples taken after fasting for at least 8 hours.           Glucose, capillary      Component Value Flag Ref Range Units Status   Glucose-Capillary 448      70 - 99 mg/dL Final   Comment:   Glucose reference range applies only to samples taken after fasting for at least 8 hours.           OCT, Retina - OU - Both Eyes       Right Eye Quality was good. Central Foveal Thickness: 280. Progression has been stable. Findings include normal foveal contour, no IRF, no SRF, vitreomacular adhesion (Stable improvement in vitreous opacities, irregular lamination, mild scattered IRHM, central ellipsoid thinning).   Left Eye Quality was good. Central Foveal Thickness: 280. Progression has been stable. Findings include abnormal  foveal contour, intraretinal hyper-reflective material, no SRF, intraretinal fluid (Stable improvement in IRF/IRHM temporal fovea and macula - just trace cystic changes remain).   Notes *Images captured and stored on drive  Diagnosis / Impression:  OD: Stable improvement in vitreous opacities, irregular lamination, mild scattered IRHM, central ellipsoid thinning OS: Stable improvement in IRF/IRHM temporal fovea and macula - just trace cystic changes remain  Clinical management:  See below  Abbreviations: NFP - Normal foveal profile. CME - cystoid macular edema. PED - pigment epithelial detachment. IRF - intraretinal fluid. SRF - subretinal fluid. EZ - ellipsoid zone. ERM - epiretinal membrane. ORA - outer retinal atrophy. ORT - outer retinal tubulation. SRHM - subretinal hyper-reflective material. IRHM - intraretinal hyper-reflective material      Intravitreal Injection, Pharmacologic Agent - OS - Left Eye       Time Out 11/17/2021. 10:18 AM. Confirmed correct patient, procedure, site, and patient consented.   Anesthesia Topical anesthesia was used. Anesthetic medications included Lidocaine 2%, Proparacaine 0.5%.   Procedure Preparation included 5% betadine to ocular surface, eyelid speculum. A (32g) needle was used.   Injection: 1.25 mg Bevacizumab 1.81m/0.05ml   Route: Intravitreal, Site: Left Eye   NDC:: 67619-509-32 Lot:: [id number] Expiration date: 01/14/2022   Post-op Post injection exam found visual acuity of at least counting fingers. The patient tolerated the procedure well. There were no complications. The patient received written and verbal post procedure care education. Post injection medications were not given.            ASSESSMENT/PLAN:    ICD-10-CM   1. Severe nonproliferative diabetic retinopathy of both eyes with macular edema associated with type 2 diabetes mellitus ([place]  [id number],  Retina - OU - Both Eyes    Intravitreal Injection, Pharmacologic Agent  - OS - Left Eye    Bevacizumab (AVASTIN) SOLN 1.25 mg    2. Essential hypertension  I10     3. Hypertensive retinopathy of both eyes  H35.033     4. Retinal ischemia  H35.82     5. Combined forms of age-related cataract of both eyes  H25.813      1. Severe non-proliferative diabetic retinopathy, both eyes  - pt lost to follow up from 01.11.23 to 03.22.23 -- family   - pt reports recent improvement in A1c from 11 down to 6 since starting Ozempic - s/p IVA OS #1 (03.22.23), #2 (04.21.23) - exam shows scattered MA, DBH and CWS OU, mild VH OD and central cystic changes OS -- improving - FA (12.23.22) scattered MA OU, no NV OU - OCT shows OD: Stable improvement in vitreous opacities, irregular lamination, mild scattered IRHM, central ellipsoid thinning; OS: Stable improvement in IRF/IRHM temporal fovea and macula - just trace cystic changes remain - recommend IVA OS #3 today, 05.19.23 - pt wishes to proceed - RBA of procedure discussed, questions answered - informed consent obtained and signed - see procedure note - f/u in 4 wks -- DFE/OCT, possible injection  2-4. Hypertensive retinopathy w/ retinal ischemia OU  - BP uncontrolled at initial consult -- 170s / 110-120s in office 12.23.22, today BP is significantly improved to 147/80  - FA 12.23.22 shows significant retinal ischemia OU -- enlarged FAZ OU (OD > OS) and significant patches of capillary drop out OU (OD>OS) - discussed importance of tight BP control and risk of MI and stroke w/ elevated BP  5. Mixed Cataract OU - The symptoms of cataract, surgical options, and treatments and risks were discussed with patient. - discussed diagnosis and progression - approaching visual significance  - referred to [facility] for cat eval and establishment of primary eye care  Ophthalmic Meds Ordered this visit:  Meds ordered this encounter  Medications   Bevacizumab (AVASTIN) SOLN 1.25 mg     Return in about 4 weeks (around  12/15/2021) for f/u NPDR OU, DFE, OCT.  There are no Patient Instructions on file for this visit.   Explained the diagnoses, plan, and follow up with the patient and they expressed understanding.  Patient expressed understanding of the importance of proper follow up care.   This document serves as a record of services personally performed by Gardiner Sleeper, MD, PhD. It was created on their behalf by Leonie Douglas, an ophthalmic technician. The creation of this record is the provider's dictation and/or activities during the visit.    Electronically signed by: Leonie Douglas COA, 11/20/21  1:03 PM  This document serves as a record of services personally performed by Gardiner Sleeper, MD, PhD. It was created on their behalf by San Jetty. Owens Shark, OA an ophthalmic technician. The creation of this record is the provider's dictation and/or activities during the visit.    Electronically signed by: San Jetty. Owens Shark, [place] 05.19.2023 1:03 PM  Gardiner Sleeper, M.D., Ph.D. Diseases & Surgery of the Retina and Vitreous [facility]  I have reviewed the above documentation for accuracy and completeness, and I agree with the above. Gardiner Sleeper, M.D., Ph.D. 11/20/21 1:03 PM   Abbreviations: M myopia (nearsighted); A astigmatism; H hyperopia (farsighted); P presbyopia; Mrx spectacle prescription;  CTL contact lenses; OD right eye; OS left eye; OU both eyes  XT exotropia; ET esotropia; PEK punctate epithelial keratitis; PEE punctate epithelial erosions; DES dry eye syndrome; MGD meibomian gland dysfunction; ATs artificial tears; PFAT's preservative free artificial tears; [place] nuclear sclerotic cataract; PSC posterior subcapsular cataract; ERM epi-retinal membrane; PVD posterior vitreous detachment; RD retinal detachment; DM diabetes mellitus; DR diabetic retinopathy; NPDR non-proliferative diabetic retinopathy; PDR proliferative diabetic retinopathy; CSME clinically significant macular edema; DME  diabetic macular edema; dbh dot blot hemorrhages; CWS cotton wool spot; POAG primary open angle glaucoma; C/D cup-to-disc ratio; HVF humphrey visual field; GVF goldmann visual field; OCT optical coherence tomography; IOP intraocular pressure; BRVO Branch retinal vein occlusion; CRVO central retinal vein occlusion; CRAO central retinal artery occlusion; BRAO branch retinal artery occlusion; RT retinal tear; SB scleral buckle; PPV pars plana vitrectomy; VH Vitreous hemorrhage; PRP panretinal laser photocoagulation; IVK intravitreal kenalog; VMT vitreomacular traction; MH Macular hole;  NVD neovascularization of the disc; NVE neovascularization elsewhere; AREDS age related eye disease study; ARMD age related macular degeneration; POAG primary open angle glaucoma; EBMD epithelial/anterior basement membrane dystrophy; ACIOL anterior chamber intraocular lens; IOL intraocular lens; PCIOL posterior chamber intraocular lens; Phaco/IOL phacoemulsification with intraocular lens placement; [place] photorefractive keratectomy; LASIK laser assisted in situ keratomileusis; HTN hypertension; DM diabetes mellitus; COPD chronic obstructive pulmonary disease

## 2021-11-10 NOTE — Progress Notes (Addendum)
CURLY, DATU ([id number]) ?Visit Report for 11/10/2021 ?HBO Details ?Patient Name: Date of Service: ?[name] [facility] 11/10/2021 1:00 PM ?Medical Record Number: [id number] ?Patient Account Number: [id number] ?Date of Birth/Sex: Treating RN: ?[birth year] (56 y.o. [name] ?Primary Care [name] ?Other Clinician: Karl [name] ?Referring [name]: ?Treating Daritza [name]: [name] ?Clinic, [place] ?Weeks in Treatment: 3 ?HBO Treatment Course Details ?Treatment Course Number: 1 ?Ordering [name]: [name] ?T Treatments Ordered: ?otal 40 HBO Treatment Start Date: 11/06/2021 ?HBO Indication: ?Diabetic Ulcer(s) of the Lower Extremity ?Standard/Conservative Wound Care tried and failed greater than or equal to ?30 days ?Wound #1 Right, Medial T [place] ?HBO Treatment Details ?Treatment Number: 4 ?Patient Type: Outpatient ?Chamber Type: Monoplace ?Chamber Serial #: [id number] ?Treatment Protocol: 2.0 ATA with 90 minutes oxygen, with two 5 minute air breaks ?Treatment Details ?Compression Rate Down: 2.0 psi / minute ?De-Compression Rate Up: 2.0 psi / minute ?A breaks and breathing ?ir ?Compress Tx Pressure periods Decompress Decompress ?Begins Reached (leave unused spaces Begins Ends ?blank) ?Chamber Pressure (ATA 1 2 2 2 2 2  --2 1 ?) ?Clock Time (24 hr) 12:56 13:06 13:36 13:41 14:11 14:16 - - 14:47 14:54 ?Treatment Length: 118 (minutes) ?Treatment Segments: 4 ?Vital Signs ?Capillary Blood Glucose Reference Range: 80 - 120 mg / dl ?HBO Diabetic Blood Glucose Intervention Range: <131 mg/dl or >188 mg/dl ?Type: Time Vitals ?Blood Pulse: Respiratory Temperature: Capillary Blood Glucose Pulse Action ?Taken: ?Pressure: ?Rate: ?Glucose (mg/dl): Meter #: Oximetry (%) Taken: ?Pre 12:47 138/91 95 16 98.4 435 Dr. Lady Gary informed of CBG ?Post 15:07 165/90 92 18 97.7 342 ?Treatment Response ?Treatment Toleration: Well ?Treatment Completion Status: Treatment Completed without Adverse  Event ?Additional Procedure Documentation ?Tissue Sevierity: Necrosis of bone ?Physician HBO Attestation: ?I certify that I supervised this HBO treatment in accordance with Medicare ?guidelines. A trained emergency response team is readily available per Yes ?hospital policies and procedures. ?Continue HBOT as ordered. Yes ?Electronic Signature(s) ?Signed: 11/10/2021 3:47:39 PM By: Christian Guess MD FACS ?Previous Signature: 11/10/2021 3:42:02 PM Version By: [name] EMT ?Previous Signature: 11/10/2021 3:38:58 PM Version By: [name] EMT ?Previous Signature: 11/10/2021 1:37:33 PM Version By: [name] EMT ?Entered By: [name] on 11/10/2021 15:47:39 ?-------------------------------------------------------------------------------- ?HBO Safety Checklist Details ?Patient Name: ?Date of Service: ?[name] [facility] 11/10/2021 1:00 PM ?Medical Record Number: [id number] ?Patient Account Number: [id number] ?Date of Birth/Sex: ?Treating RN: ?Dec 04, 1965 (56 y.o. [name] ?Primary Care Nylen Creque: Clinic, [place] ?Other Clinician: Karl [name] ?Referring Wyett Narine: ?Treating Aland Chestnutt/Extender: [name] ?Clinic, [place] ?Weeks in Treatment: 3 ?HBO Safety Checklist Items ?Safety Checklist ?Consent Form Signed ?Patient voided / foley secured and emptied ?When did you last eato 1030 ?Last dose of injectable or oral agent 0800 ?Ostomy pouch emptied and vented if applicable ?NA ?All implantable devices assessed, documented and approved ?NA ?Intravenous access site secured and place ?NA ?Valuables secured ?Linens and cotton and cotton/polyester blend (less than 51% polyester) ?Personal oil-based products / skin lotions / body lotions removed ?Wigs or hairpieces removed ?NA ?Smoking or tobacco materials removed ?Books / newspapers / magazines / loose paper removed ?Cologne, aftershave, perfume and deodorant removed ?Jewelry removed (may wrap wedding band) ?NA ?Make-up removed ?NA ?Hair care  products removed ?Battery operated devices (external) removed ?Heating patches and chemical warmers removed ?Titanium eyewear removed ?NA Frames approver by [employer] ?Nail polish cured greater than 10 hours ?NA ?Casting material cured greater than 10 hours ?NA ?Hearing aids removed ?NA ?Loose dentures or partials removed ?NA ?Prosthetics  have been removed ?NA ?Patient demonstrates correct use of air break device (if applicable) ?Patient concerns have been addressed ?Patient grounding bracelet on and cord attached to chamber ?Specifics for Inpatients (complete in addition to above) ?Medication sheet sent with patient ?NA ?Intravenous medications needed or due during therapy sent with patient ?NA ?Drainage tubes (e.g. nasogastric tube or chest tube secured and vented) ?NA ?Endotracheal or Tracheotomy tube secured ?NA ?[name] deflated of air and inflated with saline ?NA ?Airway suctioned ?NA ?Notes ?Safety checklist was done before treatment started. ?Electronic Signature(s) ?Signed: 11/10/2021 1:35:53 PM By: [name] EMT ?Entered By: [name] on 11/10/2021 13:35:53 ?

## 2021-11-10 NOTE — Progress Notes (Addendum)
DARION, MILEWSKI ([id number]) ?Visit Report for 11/10/2021 ?Arrival Information Details ?Patient Name: Date of Service: ?[name] [facility] 11/10/2021 1:00 PM ?Medical Record Number: [id number] ?Patient Account Number: [id number] ?Date of Birth/Sex: Treating RN: ?1965/08/15 (56 y.o. [name] ?Primary Care Gina Leblond: Clinic, [place] ?Other Clinician: [name] ?Referring Evva Din: ?Treating Rembert Browe/Extender: [name] ?Clinic, [place] ?Weeks in Treatment: 3 ?Visit Information History Since Last Visit ?All ordered tests and consults were completed: Yes ?Patient Arrived: Ambulatory ?Added or deleted any medications: No ?Arrival Time: 12:30 ?Any new allergies or adverse reactions: No ?Accompanied By: None ?Had a fall or experienced change in No ?Transfer Assistance: None ?activities of daily living that may affect ?Patient Identification Verified: Yes ?risk of falls: ?Secondary Verification Process Completed: Yes ?Signs or symptoms of abuse/neglect since last visito No ?Patient Requires Transmission-Based Precautions: No ?Hospitalized since last visit: No ?Patient Has Alerts: No ?Implantable device outside of the clinic excluding No ?cellular tissue based products placed in the center ?since last visit: ?Pain Present Now: No ?Electronic Signature(s) ?Signed: 11/10/2021 1:31:14 PM By: [name] EMT ?Entered By: [name] on 11/10/2021 13:31:14 ?-------------------------------------------------------------------------------- ?Encounter Discharge Information Details ?Patient Name: Date of Service: ?[name] [facility] 11/10/2021 1:00 PM ?Medical Record Number: [id number] ?Patient Account Number: [id number] ?Date of Birth/Sex: Treating RN: ?September 20, 1965 (56 y.o. [name] ?Primary Care Claudett Bayly: Clinic, [place] ?Other Clinician: [name] ?Referring Iman Reinertsen: ?Treating Arnetia Bronk/Extender: [name] ?Clinic, [place] ?Weeks in Treatment: 3 ?Encounter Discharge Information  Items ?Discharge Condition: Stable ?Ambulatory Status: Ambulatory ?Discharge Destination: Home ?Transportation: Private Auto ?Accompanied By: None ?Schedule Follow-up Appointment: No ?Clinical Summary of Care: ?Electronic Signature(s) ?Signed: 11/10/2021 3:41:17 PM By: [name] EMT ?Entered By: [name] on 11/10/2021 15:41:17 ?-------------------------------------------------------------------------------- ?Vitals Details ?Patient Name: ?Date of Service: ?[name] [facility] 11/10/2021 1:00 PM ?Medical Record Number: [id number] ?Patient Account Number: [id number] ?Date of Birth/Sex: ?Treating RN: ?1965-10-25 (56 y.o. [name] ?Primary Care Montrice Gracey: Clinic, [place] ?Other Clinician: [name] ?Referring Odella Appelhans: ?Treating Kirsti Mcalpine/Extender: [name] ?Clinic, [place] ?Weeks in Treatment: 3 ?Vital Signs ?Time Taken: 12:47 ?Temperature (??F): 98.4 ?Height (in): 69 ?Pulse (bpm): 95 ?Weight (lbs): 180 ?Respiratory Rate (breaths/min): 16 ?Body Mass Index (BMI): 26.6 ?Blood Pressure (mmHg): 138/91 ?Capillary Blood Glucose (mg/dl): 761 ?Reference Range: 80 - 120 mg / dl ?Notes ?Dr. Lady Gary informed for the patient's CBG of 435. ?Electronic Signature(s) ?Signed: 11/10/2021 1:32:29 PM By: [name] EMT ?Entered By: [name] on 11/10/2021 13:32:28 ?

## 2021-11-10 NOTE — Progress Notes (Signed)
BRANDT, CHANEY ([id number]) ?Visit Report for 11/10/2021 ?Problem List Details ?Patient Name: Date of Service: ?[name] [facility] 11/10/2021 1:00 PM ?Medical Record Number: [id number] ?Patient Account Number: [id number] ?Date of Birth/Sex: Treating RN: ?December 19, 1965 (56 y.o. 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, right ankle and foot ?Facility Procedures ?CPT4 Code: 75643329 ?Description: G0277-(Facility Use Only) HBOT full body chamber, , ICD-10 Diagnosis Description E11.621 Type 2 diabetes  mellitus with foot ulcer L97.514 Non-pressure chronic ulcer of other part of right foot with necrosis of bone M86.071 Acute  ?hematogenous osteomyelitis, right ankle and foot ?Modifier: ?Quantity: 4 ?Physician Procedures ?: CPT4 Code Description Modifier [id number] - WC PHYS HYPERBARIC OXYGEN THERAPY ICD-10 Diagnosis Description E11.621 Type 2 diabetes mellitus with foot ulcer L97.514 Non-pressure chronic ulcer of other part of right foot with necrosis of bone M86.071  ?Acute hematogenous osteomyelitis, right ankle and foot ?Quantity: 1 ?Electronic Signature(s) ?Signed: 11/10/2021 3:39:25 PM By: [name] EMT ?Signed: 11/10/2021 3:51:57 PM By: [name] MD FACS ?Entered By: [name] on 11/10/2021 15:39:24 ?

## 2021-11-13 ENCOUNTER — Encounter (HOSPITAL_BASED_OUTPATIENT_CLINIC_OR_DEPARTMENT_OTHER): Payer: No Typology Code available for payment source | Attending: General Surgery | Admitting: General Surgery

## 2021-11-13 ENCOUNTER — Encounter (HOSPITAL_BASED_OUTPATIENT_CLINIC_OR_DEPARTMENT_OTHER): Payer: No Typology Code available for payment source | Admitting: Internal Medicine

## 2021-11-13 DIAGNOSIS — M86071 Acute hematogenous osteomyelitis, right ankle and foot: Secondary | ICD-10-CM | POA: Diagnosis not present

## 2021-11-13 DIAGNOSIS — L97514 Non-pressure chronic ulcer of other part of right foot with necrosis of bone: Secondary | ICD-10-CM

## 2021-11-13 DIAGNOSIS — E11621 Type 2 diabetes mellitus with foot ulcer: Secondary | ICD-10-CM | POA: Diagnosis not present

## 2021-11-13 LAB — GLUCOSE, CAPILLARY
Glucose-Capillary: 548 mg/dL (ref 70–99)
Glucose-Capillary: 454 mg/dL — ABNORMAL HIGH (ref 70–99)

## 2021-11-13 NOTE — Progress Notes (Addendum)
[name], [name] ([id number]) ?Visit Report for 11/13/2021 ?HBO Details ?Patient Name: Date of Service: ?[name] [facility] 11/13/2021 1:00 PM ?Medical Record Number: [id number] ?Patient Account Number: [id number] ?Date of Birth/Sex: Treating RN: ?10/23/1965 (56 y.o. [name] ?Primary Care Arli Bree: Clinic, [place] ?Other Clinician: [name] ?Referring Janaiah Vetrano: ?Treating Magdaleno Lortie/Extender: [name] ?Clinic, [place] ?Weeks in Treatment: 3 ?HBO Treatment Course Details ?Treatment Course Number: 1 ?Ordering Annaliz Aven: [name] ?T Treatments Ordered: ?otal 40 HBO Treatment Start Date: 11/06/2021 ?HBO Indication: ?Diabetic Ulcer(s) of the Lower Extremity ?Standard/Conservative Wound Care tried and failed greater than or equal to ?30 days ?Wound #1 Right, Medial T Great ?oe ?HBO Treatment Details ?Treatment Number: 5 ?Patient Type: Outpatient ?Chamber Type: Monoplace ?Chamber Serial #: [id number] ?Treatment Protocol: 2.0 ATA with 90 minutes oxygen, with two 5 minute air breaks ?Treatment Details ?Compression Rate Down: 2.0 psi / minute ?De-Compression Rate Up: 2.0 psi / minute ?A breaks and breathing ?ir ?Compress Tx Pressure periods Decompress Decompress ?Begins Reached (leave unused spaces Begins Ends ?blank) ?Chamber Pressure (ATA 1 2 2 2 2 2  --2 1 ?) ?Clock Time (24 hr) 13:01 13:12 13:42 13:47 14:18 14:23 - - 14:53 15:00 ?Treatment Length: 119 (minutes) ?Treatment Segments: 4 ?Vital Signs ?Capillary Blood Glucose Reference Range: 80 - 120 mg / dl ?HBO Diabetic Blood Glucose Intervention Range: <131 mg/dl or >086 mg/dl ?Time Vitals Blood Respiratory Capillary Blood Glucose Pulse Action ?Type: ?Pulse: Temperature: ?Taken: ?Pressure: ?Rate: ?Glucose (mg/dl): ?Meter #: Oximetry (%) Taken: ?Pre 12:57 135/84 98 18 97.9 548 ?Post 15:04 133/73 90 16 97.5 454 ?Treatment Response ?Treatment Toleration: Well ?Treatment Completion Status: Treatment Completed without Adverse Event ?Treatment Notes ?Dr.  Lady Gary informed of the patient per and post glucose readings. ?The patient did well today, with no problems noted. ?Additional Procedure Documentation ?Tissue Sevierity: Necrosis of bone ?Physician HBO Attestation: ?I certify that I supervised this HBO treatment in accordance with Medicare ?guidelines. A trained emergency response team is readily available per Yes ?hospital policies and procedures. ?Continue HBOT as ordered. Yes ?Electronic Signature(s) ?Signed: 11/13/2021 4:27:27 PM By: [name] DO ?Previous Signature: 11/13/2021 3:48:13 PM Version By: [name] EMT ?Previous Signature: 11/13/2021 4:24:16 PM Version By: [name] DO ?Previous Signature: 11/13/2021 4:24:16 PM Version By: [name] DO ?Entered By: [name] on 11/13/2021 16:27:03 ?-------------------------------------------------------------------------------- ?HBO Safety Checklist Details ?Patient Name: ?Date of Service: ?[name] [facility] 11/13/2021 1:00 PM ?Medical Record Number: [id number] ?Patient Account Number: [id number] ?Date of Birth/Sex: ?Treating RN: ?04-16-1966 (56 y.o. [name] ?Primary Care Sair Faulcon: Clinic, [place] ?Other Clinician: [name] ?Referring Toluwani Yadav: ?Treating Abeer Deskins/Extender: [name] ?Clinic, [place] ?Weeks in Treatment: 3 ?HBO Safety Checklist Items ?Safety Checklist ?Consent Form Signed ?Patient voided / foley secured and emptied ?When did you last eato 1245 ?Last dose of injectable or oral agent Monday 11/06/2021 ?Ostomy pouch emptied and vented if applicable ?NA ?All implantable devices assessed, documented and approved ?NA ?Intravenous access site secured and place ?NA ?Valuables secured ?Linens and cotton and cotton/polyester blend (less than 51% polyester) ?Personal oil-based products / skin lotions / body lotions removed ?Wigs or hairpieces removed ?NA ?Smoking or tobacco materials removed ?Books / newspapers / magazines / loose paper removed ?Cologne,  aftershave, perfume and deodorant removed ?Jewelry removed (may wrap wedding band) ?NA ?Make-up removed ?NA ?Hair care products removed ?Battery operated devices (external) removed ?Heating patches and chemical warmers removed ?Titanium eyewear removed ?NA Frames approver by [employer] ?Nail polish cured greater than 10 hours ?NA ?Casting material  cured greater than 10 hours ?NA ?Hearing aids removed ?NA ?Loose dentures or partials removed ?NA ?Prosthetics have been removed ?NA ?Patient demonstrates correct use of air break device (if applicable) ?Patient concerns have been addressed ?Patient grounding bracelet on and cord attached to chamber ?Specifics for Inpatients (complete in addition to above) ?Medication sheet sent with patient ?NA ?Intravenous medications needed or due during therapy sent with patient ?NA ?Drainage tubes (e.g. nasogastric tube or chest tube secured and vented) ?NA ?Endotracheal or Tracheotomy tube secured ?NA ?[name] deflated of air and inflated with saline ?NA ?Airway suctioned ?NA ?Notes ?Safety checklist was done before treatment started. ?Electronic Signature(s) ?Signed: 11/13/2021 3:44:53 PM By: [name] EMT ?Entered By: [name] on 11/13/2021 15:44:53 ?

## 2021-11-13 NOTE — Progress Notes (Addendum)
NICKLAUS, ALVIAR ([id number]) ?Visit Report for 11/13/2021 ?Arrival Information Details ?Patient Name: Date of Service: ?[name] [facility] 11/13/2021 1:00 PM ?Medical Record Number: [id number] ?Patient Account Number: [id number] ?Date of Birth/Sex: Treating RN: ?[birth year] (56 y.o. [name] ?Primary Care Tonie Vizcarrondo: Clinic, [place] ?Other Clinician: [name] ?Referring Marcelina Mclaurin: ?Treating Caress Reffitt/Extender: [name] ?Clinic, [place] ?Weeks in Treatment: 3 ?Visit Information History Since Last Visit ?All ordered tests and consults were completed: Yes ?Patient Arrived: Ambulatory ?Added or deleted any medications: No ?Arrival Time: 12:43 ?Any new allergies or adverse reactions: No ?Accompanied By: None ?Had a fall or experienced change in No ?Transfer Assistance: None ?activities of daily living that may affect ?Patient Identification Verified: Yes ?risk of falls: ?Secondary Verification Process Completed: Yes ?Signs or symptoms of abuse/neglect since last visito No ?Patient Requires Transmission-Based Precautions: No ?Hospitalized since last visit: No ?Patient Has Alerts: No ?Implantable device outside of the clinic excluding No ?cellular tissue based products placed in the center ?since last visit: ?Pain Present Now: No ?Electronic Signature(s) ?Signed: 11/13/2021 3:42:16 PM By: [name] EMT ?Entered By: [name] on 11/13/2021 15:42:16 ?-------------------------------------------------------------------------------- ?Encounter Discharge Information Details ?Patient Name: Date of Service: ?[name] [facility] [facility] 11/13/2021 1:00 PM ?Medical Record Number: [id number] ?Patient Account Number: [id number] ?Date of Birth/Sex: Treating RN: ?05/05/1966 (56 y.o. [name] ?Primary Care Finbar Nippert: Clinic, [place] ?Other Clinician: [name] ?Referring Cherell Colvin: ?Treating Nikeria Kalman/Extender: [name] ?Clinic, [place] ?Weeks in Treatment: 3 ?Encounter Discharge Information  Items ?Discharge Condition: Stable ?Ambulatory Status: Ambulatory ?Discharge Destination: Home ?Transportation: Private Auto ?Accompanied By: None ?Schedule Follow-up Appointment: No ?Clinical Summary of Care: ?Electronic Signature(s) ?Signed: 11/13/2021 3:50:32 PM By: [name] EMT ?Entered By: [name] on 11/13/2021 15:50:31 ?-------------------------------------------------------------------------------- ?Vitals Details ?Patient Name: ?Date of Service: ?[name] [facility] 11/13/2021 1:00 PM ?Medical Record Number: [id number] ?Patient Account Number: [id number] ?Date of Birth/Sex: ?Treating RN: ?Jul 30, 1965 (56 y.o. [name] ?Primary Care Arfa Lamarca: Clinic, [place] ?Other Clinician: [name] ?Referring Ashanti Ratti: ?Treating Gowri Suchan/Extender: [name] ?Clinic, [place] ?Weeks in Treatment: 3 ?Vital Signs ?Time Taken: 12:57 ?Temperature (??F): 97.9 ?Height (in): 69 ?Pulse (bpm): 98 ?Weight (lbs): 180 ?Respiratory Rate (breaths/min): 18 ?Body Mass Index (BMI): 26.6 ?Blood Pressure (mmHg): 135/84 ?Capillary Blood Glucose (mg/dl): 818 ?Reference Range: 80 - 120 mg / dl ?Electronic Signature(s) ?Signed: 11/13/2021 3:42:52 PM By: [name] EMT ?Entered By: [name] on 11/13/2021 15:42:52 ?

## 2021-11-13 NOTE — Progress Notes (Signed)
[name], [name] ([id number]) ?Visit Report for 11/13/2021 ?Problem List Details ?Patient Name: Date of Service: ?[name] [facility] 11/13/2021 1:00 PM ?Medical Record Number: [id number] ?Patient Account Number: [id number] ?Date of Birth/Sex: Treating RN: ?[birth year] (56 y.o. [name] ?Primary Care Provider: Clinic, [place] ?Other Clinician: [name] ?Referring Provider: ?Treating Provider/Extender: [name] ?Clinic, [place] ?Weeks in Treatment: 3 ?Active Problems ?ICD-10 ?Encounter ?Code Description Active Date MDM ?Diagnosis ?E11.621 Type 2 diabetes mellitus with foot ulcer 10/19/2021 No Yes ?L97.514 Non-pressure chronic ulcer of other part of right foot with necrosis of bone 10/19/2021 No Yes ?M86.071 Acute hematogenous osteomyelitis, right ankle and foot 10/19/2021 No Yes ?Inactive Problems ?Resolved Problems ?Electronic Signature(s) ?Signed: 11/13/2021 3:48:43 PM By: [name] EMT ?Signed: 11/13/2021 4:24:16 PM By: [name] DO ?Entered By: [name] on 11/13/2021 15:48:43 ?-------------------------------------------------------------------------------- ?SuperBill Details ?Patient Name: Date of Service: ?[name] [facility] 11/13/2021 ?Medical Record Number: [id number] ?Patient Account Number: [id number] ?Date of Birth/Sex: Treating RN: ?July 05, 1965 (56 y.o. 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, right ankle and foot ?Facility Procedures ?CPT4 Code: 24235361 ?Description: G0277-(Facility Use Only) HBOT full body chamber, , ICD-10 Diagnosis Description E11.621 Type 2 diabetes mellitus  with foot ulcer L97.514 Non-pressure chronic ulcer of other part of right foot with necrosis of bone M86.071 Acute  ?hematogenous osteomyelitis, right ankle and foot ?Modifier: ?Quantity: 4 ?Physician Procedures ?: CPT4 Code Description Modifier [id number] 99183 - WC PHYS HYPERBARIC OXYGEN THERAPY ICD-10 Diagnosis Description E11.621 Type 2 diabetes mellitus with foot ulcer L97.514 Non-pressure chronic ulcer of other part of right foot with necrosis of bone M86.071  ?Acute hematogenous osteomyelitis, right ankle and foot ?Quantity: 1 ?Electronic Signature(s) ?Signed: 11/13/2021 3:48:38 PM By: [name] EMT ?Signed: 11/13/2021 4:24:16 PM By: [name] DO ?Entered By: [name] on 11/13/2021 15:48:37 ?

## 2021-11-14 ENCOUNTER — Encounter (HOSPITAL_BASED_OUTPATIENT_CLINIC_OR_DEPARTMENT_OTHER): Payer: No Typology Code available for payment source | Admitting: Internal Medicine

## 2021-11-14 DIAGNOSIS — E11621 Type 2 diabetes mellitus with foot ulcer: Secondary | ICD-10-CM | POA: Diagnosis not present

## 2021-11-14 DIAGNOSIS — L97514 Non-pressure chronic ulcer of other part of right foot with necrosis of bone: Secondary | ICD-10-CM | POA: Diagnosis not present

## 2021-11-14 DIAGNOSIS — M86071 Acute hematogenous osteomyelitis, right ankle and foot: Secondary | ICD-10-CM

## 2021-11-14 LAB — GLUCOSE, CAPILLARY
Glucose-Capillary: 434 mg/dL — ABNORMAL HIGH (ref 70–99)
Glucose-Capillary: 353 mg/dL — ABNORMAL HIGH (ref 70–99)

## 2021-11-14 NOTE — Progress Notes (Addendum)
Christian, [name] ([id number]) ?Visit Report for 11/14/2021 ?HBO Details ?Patient Name: Date of Service: ?[name] [facility] 11/14/2021 1:00 PM ?Medical Record Number: [id number] ?Patient Account Number: [id number] ?Date of Birth/Sex: Treating RN: ?1966-05-26 (55 y.o. [name] ?Primary Care [name]: Clinic, [place] ?Other Clinician: [name] ?Referring [name]: ?Treating [name]/Extender: [name] ?Clinic, [place] ?Weeks in Treatment: 3 ?HBO Treatment Course Details ?Treatment Course Number: 1 ?Ordering [name]: [name] ?T Treatments Ordered: ?otal 40 HBO Treatment Start Date: 11/06/2021 ?HBO Indication: ?Diabetic Ulcer(s) of the Lower Extremity ?Standard/Conservative Wound Care tried and failed greater than or equal to ?30 days ?Wound #1 Right, Medial T Great ?oe ?HBO Treatment Details ?Treatment Number: 6 ?Patient Type: Outpatient ?Chamber Type: Monoplace ?Chamber Serial #: [id number] ?Treatment Protocol: 2.0 ATA with 90 minutes oxygen, with two 5 minute air breaks ?Treatment Details ?Compression Rate Down: 2.0 psi / minute ?De-Compression Rate Up: 2.0 psi / minute ?A breaks and breathing ?ir ?Compress Tx Pressure periods Decompress Decompress ?Begins Reached (leave unused spaces Begins Ends ?blank) ?Chamber Pressure (ATA 1 2 2 2 2 2  --2 1 ?) ?Clock Time (24 hr) 12:56 13:07 13:33 13:39 14:09 14:14 - - 14:44 14:51 ?Treatment Length: 115 (minutes) ?Treatment Segments: 4 ?Vital Signs ?Capillary Blood Glucose Reference Range: 80 - 120 mg / dl ?HBO Diabetic Blood Glucose Intervention Range: <131 mg/dl or >742 mg/dl ?Time Vitals Blood Respiratory Capillary Blood Glucose Pulse Action ?Type: ?Pulse: Temperature: ?Taken: ?Pressure: ?Rate: ?Glucose (mg/dl): ?Meter #: Oximetry (%) Taken: ?Pre 12:52 137/89 101 16 98.3 434 ?Post 14:55 144/97 100 16 98 353 ?Treatment Response ?Treatment Toleration: Well ?Treatment Completion Status: Treatment Completed without Adverse Event ?Additional Procedure  Documentation ?Tissue Sevierity: Necrosis of bone ?Physician HBO Attestation: ?I certify that I supervised this HBO treatment in accordance with Medicare ?guidelines. A trained emergency response team is readily available per Yes ?hospital policies and procedures. ?Continue HBOT as ordered. Yes ?Electronic Signature(s) ?Signed: 11/14/2021 5:15:00 PM By: [name] DO ?Previous Signature: 11/14/2021 3:39:40 PM Version By: [name] EMT ?Entered By: [name] on 11/14/2021 17:14:14 ?-------------------------------------------------------------------------------- ?HBO Safety Checklist Details ?Patient Name: ?Date of Service: ?[name] [facility] 11/14/2021 1:00 PM ?Medical Record Number: [id number] ?Patient Account Number: [id number] ?Date of Birth/Sex: ?Treating RN: ?[birth year] (56 y.o. [name] ?Primary Care [name]: Clinic, [place] ?Other Clinician: [name] ?Referring [name]: ?Treating [name]/Extender: [name] ?Clinic, [place] ?Weeks in Treatment: 3 ?HBO Safety Checklist Items ?Safety Checklist ?Consent Form Signed ?Patient voided / foley secured and emptied ?When did you last eato 1000 ?Last dose of injectable or oral agent 11/13/2021 ?Ostomy pouch emptied and vented if applicable ?NA ?All implantable devices assessed, documented and approved ?NA ?Intravenous access site secured and place ?NA ?Valuables secured ?Linens and cotton and cotton/polyester blend (less than 51% polyester) ?Personal oil-based products / skin lotions / body lotions removed ?Wigs or hairpieces removed ?NA ?Smoking or tobacco materials removed ?Books / newspapers / magazines / loose paper removed ?Cologne, aftershave, perfume and deodorant removed ?Jewelry removed (may wrap wedding band) ?NA ?Make-up removed ?NA ?Hair care products removed ?NA ?Battery operated devices (external) removed ?Heating patches and chemical warmers removed ?Titanium eyewear removed ?NA Frames approver by [employer] ?Nail polish cured greater than 10 hours ?NA ?Casting material cured greater than 10 hours ?NA ?Hearing aids removed ?NA ?Loose dentures or partials removed ?NA ?Prosthetics have been removed ?NA ?Patient demonstrates correct use of air break device (if applicable) ?Patient concerns have been addressed ?Patient grounding bracelet on and cord  attached to chamber ?Specifics for Inpatients (complete in addition to above) ?Medication sheet sent with patient ?NA ?Intravenous medications needed or due during therapy sent with patient ?NA ?Drainage tubes (e.g. nasogastric tube or chest tube secured and vented) ?NA ?Endotracheal or Tracheotomy tube secured ?NA ?[name] deflated of air and inflated with saline ?NA ?Airway suctioned ?NA ?Notes ?Safety checklist was done before treatment started. ?Electronic Signature(s) ?Signed: 11/14/2021 3:37:31 PM By: [name] EMT ?Entered By: [name] on 11/14/2021 15:37:30 ?

## 2021-11-14 NOTE — Progress Notes (Signed)
LORENZO, ARSCOTT ([id number]) ?Visit Report for 11/14/2021 ?Problem List Details ?Patient Name: Date of Service: ?[name] [facility] 11/14/2021 1:00 PM ?Medical Record Number: [id number] ?Patient Account Number: [id number] ?Date of Birth/Sex: Treating RN: ?1965-09-18 (56 y.o. 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, right ankle and foot ?Facility Procedures ?CPT4 Code: 85631497 ?Description: G0277-(Facility Use Only) HBOT full body chamber, , ICD-10 Diagnosis Description E11.621 Type 2 diabetes mellitus  with foot ulcer L97.514 Non-pressure chronic ulcer of other part of right foot with necrosis of bone M86.071 Acute  ?hematogenous osteomyelitis, right ankle and foot ?Modifier: ?Quantity: 4 ?Physician Procedures ?: CPT4 Code Description Modifier [id number] 99183 - WC PHYS HYPERBARIC OXYGEN THERAPY ICD-10 Diagnosis Description E11.621 Type 2 diabetes mellitus with foot ulcer L97.514 Non-pressure chronic ulcer of other part of right foot with necrosis of bone M86.071  ?Acute hematogenous osteomyelitis, right ankle and foot ?Quantity: 1 ?Electronic Signature(s) ?Signed: 11/14/2021 3:40:09 PM By: [name] EMT ?Signed: 11/14/2021 5:15:00 PM By: [name] DO ?Entered By: [name] on 11/14/2021 15:40:09 ?

## 2021-11-14 NOTE — Progress Notes (Signed)
ANTINIO, BURTENSHAW ([id number]) ?Visit Report for 11/14/2021 ?Arrival Information Details ?Patient Name: Date of Service: ?[name] 11/14/2021 1:00 PM ?Medical Record Number: [id number] ?Patient Account Number: [id number] ?Date of Birth/Sex: Treating RN: ?08-May-1966 (56 y.o. Janyth Contes ?Primary Care Dakota Stangl: Clinic, [place] ?Other Clinician: Valeria Batman ?Referring Mera Gunkel: ?Treating Mekiyah Gladwell/Extender: Kalman Shan ?Clinic, [place] ?Weeks in Treatment: 3 ?Visit Information History Since Last Visit ?All ordered tests and consults were completed: Yes ?Patient Arrived: Ambulatory ?Added or deleted any medications: No ?Arrival Time: 12:40 ?Any new allergies or adverse reactions: No ?Accompanied By: None ?Had a fall or experienced change in No ?Transfer Assistance: None ?activities of daily living that may affect ?Patient Identification Verified: Yes ?risk of falls: ?Secondary Verification Process Completed: Yes ?Signs or symptoms of abuse/neglect since last visito No ?Patient Requires Transmission-Based Precautions: No ?Hospitalized since last visit: No ?Patient Has Alerts: No ?Implantable device outside of the clinic excluding No ?cellular tissue based products placed in the [name] ?since last visit: ?Pain Present Now: No ?Electronic Signature(s) ?Signed: 11/14/2021 3:26:28 PM By: Valeria Batman EMT ?Entered By: Valeria Batman on 11/14/2021 15:26:27 ?-------------------------------------------------------------------------------- ?Encounter Discharge Information Details ?Patient Name: Date of Service: ?[name] 11/14/2021 1:00 PM ?Medical Record Number: [id number] ?Patient Account Number: [id number] ?Date of Birth/Sex: Treating RN: ?[birth year] (56 y.o. Janyth Contes ?Primary Care Denym Rahimi: Clinic, [place] ?Other Clinician: Valeria Batman ?Referring Loris Winrow: ?Treating Brenin Heidelberger/Extender: Kalman Shan ?Clinic, [place] ?Weeks in Treatment: 3 ?Encounter Discharge Information  Items ?Discharge Condition: Stable ?Ambulatory Status: Ambulatory ?Discharge Destination: Home ?Transportation: Private Auto ?Accompanied By: None ?Schedule Follow-up Appointment: Yes ?Clinical Summary of Care: ?Electronic Signature(s) ?Signed: 11/14/2021 3:40:53 PM By: Valeria Batman EMT ?Entered By: Valeria Batman on 11/14/2021 15:40:53 ?-------------------------------------------------------------------------------- ?Vitals Details ?Patient Name: ?Date of Service: ?[name] 11/14/2021 1:00 PM ?Medical Record Number: [id number] ?Patient Account Number: [id number] ?Date of Birth/Sex: ?Treating RN: ?05-04-1966 (56 y.o. Janyth Contes ?Primary Care Donnalyn Juran: Clinic, [place] ?Other Clinician: Valeria Batman ?Referring Dimitriy Carreras: ?Treating Kyden Potash/Extender: Kalman Shan ?Clinic, [place] ?Weeks in Treatment: 3 ?Vital Signs ?Time Taken: 12:52 ?Temperature (??F): 98.3 ?Height (in): 69 ?Pulse (bpm): 101 ?Weight (lbs): 180 ?Respiratory Rate (breaths/min): 16 ?Body Mass Index (BMI): 26.6 ?Blood Pressure (mmHg): 137/89 ?Capillary Blood Glucose (mg/dl): 434 ?Reference Range: 80 - 120 mg / dl ?Notes ?Dr. Heber [place] informed of the patient CBG. ?Electronic Signature(s) ?Signed: 11/14/2021 3:35:27 PM By: Valeria Batman EMT ?Entered By: Valeria Batman on 11/14/2021 15:35:27 ?

## 2021-11-15 ENCOUNTER — Encounter (HOSPITAL_BASED_OUTPATIENT_CLINIC_OR_DEPARTMENT_OTHER): Payer: No Typology Code available for payment source | Admitting: General Surgery

## 2021-11-15 DIAGNOSIS — E11621 Type 2 diabetes mellitus with foot ulcer: Secondary | ICD-10-CM | POA: Diagnosis not present

## 2021-11-15 LAB — GLUCOSE, CAPILLARY
Glucose-Capillary: 424 mg/dL — ABNORMAL HIGH (ref 70–99)
Glucose-Capillary: 343 mg/dL — ABNORMAL HIGH (ref 70–99)

## 2021-11-15 NOTE — Progress Notes (Signed)
MATTIX, IMHOF ([id number]) ?Visit Report for 11/15/2021 ?Problem List Details ?Patient Name: Date of Service: ?[name] [name] 11/15/2021 1:00 PM ?Medical Record Number: [id number] ?Patient Account Number: [id number] ?Date of Birth/Sex: Treating RN: ?09-Mar-1966 (56 y.o. 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, right ankle and foot ?Facility Procedures ?CPT4 Code: [id number] ?Description: G0277-(Facility Use Only) HBOT full body chamber, , ICD-10 Diagnosis Description E11.621 Type 2 diabetes mellitus  with foot ulcer L97.514 Non-pressure chronic ulcer of other part of right foot with necrosis of bone M86.071 Acute  ?hematogenous osteomyelitis, right ankle and foot ?Modifier: ?Quantity: 4 ?Physician Procedures ?: CPT4 Code Description Modifier [id number] - WC PHYS HYPERBARIC OXYGEN THERAPY ICD-10 Diagnosis Description E11.621 Type 2 diabetes mellitus with foot ulcer L97.514 Non-pressure chronic ulcer of other part of right foot with necrosis of bone M86.071  ?Acute hematogenous osteomyelitis, right ankle and foot ?Quantity: 1 ?Electronic Signature(s) ?Signed: 11/15/2021 3:37:36 PM By: [name] EMT ?Signed: 11/15/2021 5:26:27 PM By: [name] MD FACS ?Entered By: [name] on 11/15/2021 15:37:36 ?

## 2021-11-15 NOTE — Progress Notes (Addendum)
GANON, BRISBIN ([id number]) ?Visit Report for 11/15/2021 ?HBO Details ?Patient Name: Date of Service: ?[name] [facility] 11/15/2021 1:00 PM ?Medical Record Number: [id number] ?Patient Account Number: [id number] ?Date of Birth/Sex: Treating RN: ?1966/01/27 (56 y.o. M) [name], Bobbi ?Primary Care Terilynn Buresh: Clinic, [place] ?Other Clinician: [name] ?Referring Dallis Darden: ?Treating Manal Kreutzer/Extender: [name] ?Clinic, [place] ?Weeks in Treatment: 3 ?HBO Treatment Course Details ?Treatment Course Number: 1 ?Ordering Editha Bridgeforth: [name] ?T Treatments Ordered: ?otal 40 HBO Treatment Start Date: 11/06/2021 ?HBO Indication: ?Diabetic Ulcer(s) of the Lower Extremity ?Standard/Conservative Wound Care tried and failed greater than or equal to ?30 days ?Wound #1 Right, Medial T [place] ?HBO Treatment Details ?Treatment Number: 7 ?Patient Type: Outpatient ?Chamber Type: Monoplace ?Chamber Serial #: [id number] ?Treatment Protocol: 2.0 ATA with 90 minutes oxygen, with two 5 minute air breaks ?Treatment Details ?Compression Rate Down: 2.0 psi / minute ?De-Compression Rate Up: 2.0 psi / minute ?A breaks and breathing ?ir ?Compress Tx Pressure periods Decompress Decompress ?Begins Reached (leave unused spaces Begins Ends ?blank) ?Chamber Pressure (ATA 1 2 2 2 2 2  --2 1 ?) ?Clock Time (24 hr) 13:04 13:13 13:43 13:48 14:18 14:26 - - 14:53 15:00 ?Treatment Length: 116 (minutes) ?Treatment Segments: 4 ?Vital Signs ?Capillary Blood Glucose Reference Range: 80 - 120 mg / dl ?HBO Diabetic Blood Glucose Intervention Range: <131 mg/dl or >413 mg/dl ?Time Vitals Blood Respiratory Capillary Blood Glucose Pulse Action ?Type: ?Pulse: Temperature: ?Taken: ?Pressure: ?Rate: ?Glucose (mg/dl): ?Meter #: Oximetry (%) Taken: ?Pre 12:49 146/99 101 16 98.2 424 ?Post 15:07 135/90 94 18 98.1 343 ?Treatment Response ?Treatment Toleration: Well ?Treatment Completion Status: Treatment Completed without Adverse Event ?Additional Procedure  Documentation ?Tissue Sevierity: Necrosis of bone ?Physician HBO Attestation: ?I certify that I supervised this HBO treatment in accordance with Medicare ?guidelines. A trained emergency response team is readily available per Yes ?hospital policies and procedures. ?Continue HBOT as ordered. Yes ?Electronic Signature(s) ?Signed: 11/15/2021 5:26:11 PM By: [name] MD FACS ?Previous Signature: 11/15/2021 3:37:11 PM Version By: [name] EMT ?Entered By: [name] on 11/15/2021 17:26:11 ?-------------------------------------------------------------------------------- ?HBO Safety Checklist Details ?Patient Name: ?Date of Service: ?[name] [facility] 11/15/2021 1:00 PM ?Medical Record Number: [id number] ?Patient Account Number: [id number] ?Date of Birth/Sex: ?Treating RN: ?1966-02-27 (56 y.o. M) [name], Bobbi ?Primary Care Deneane Stifter: Clinic, [place] ?Other Clinician: [name] ?Referring Franziska Podgurski: ?Treating Esaw Knippel/Extender: [name] ?Clinic, [place] ?Weeks in Treatment: 3 ?HBO Safety Checklist Items ?Safety Checklist ?Consent Form Signed ?Patient voided / foley secured and emptied ?When did you last eato 1200 ?Last dose of injectable or oral agent 11/14/2021 ?Ostomy pouch emptied and vented if applicable ?NA ?All implantable devices assessed, documented and approved ?NA ?Intravenous access site secured and place ?NA ?Valuables secured ?Linens and cotton and cotton/polyester blend (less than 51% polyester) ?Personal oil-based products / skin lotions / body lotions removed ?Wigs or hairpieces removed ?NA ?Smoking or tobacco materials removed ?Books / newspapers / magazines / loose paper removed ?Cologne, aftershave, perfume and deodorant removed ?Jewelry removed (may wrap wedding band) ?NA ?Make-up removed ?NA ?Hair care products removed ?Battery operated devices (external) removed ?Heating patches and chemical warmers removed ?Titanium eyewear removed ?NA Frames approver by [employer] ?Nail polish cured greater than 10 hours ?NA ?Casting material cured greater than 10 hours ?NA ?Hearing aids removed ?NA ?Loose dentures or partials removed ?NA ?Prosthetics have been removed ?NA ?Patient demonstrates correct use of air break device (if applicable) ?Patient concerns have been addressed ?Patient grounding bracelet on and cord  attached to chamber ?Specifics for Inpatients (complete in addition to above) ?Medication sheet sent with patient ?NA ?Intravenous medications needed or due during therapy sent with patient ?NA ?Drainage tubes (e.g. nasogastric tube or chest tube secured and vented) ?NA ?Endotracheal or Tracheotomy tube secured ?NA ?[name] deflated of air and inflated with saline ?NA ?Airway suctioned ?NA ?Electronic Signature(s) ?Signed: 11/15/2021 3:35:14 PM By: [name] EMT ?Entered By: [name] on 11/15/2021 15:35:14 ?

## 2021-11-15 NOTE — Progress Notes (Addendum)
TERRYL, MOLINELLI ([id number]) ?Visit Report for 11/15/2021 ?Arrival Information Details ?Patient Name: Date of Service: ?[name] [facility] 11/15/2021 1:00 PM ?Medical Record Number: [id number] ?Patient Account Number: [id number] ?Date of Birth/Sex: Treating RN: ?Nov 05, 1965 (56 y.o. M) [name], Bobbi ?Primary Care Darryl Willner: Clinic, [place] ?Other Clinician: [name] ?Referring Tymar Polyak: ?Treating Kasey Hansell/Extender: [name] ?Clinic, [place] ?Weeks in Treatment: 3 ?Visit Information History Since Last Visit ?All ordered tests and consults were completed: Yes ?Patient Arrived: Ambulatory ?Added or deleted any medications: No ?Arrival Time: 12:49 ?Any new allergies or adverse reactions: No ?Accompanied By: None ?Had a fall or experienced change in No ?Transfer Assistance: None ?activities of daily living that may affect ?Patient Identification Verified: Yes ?risk of falls: ?Secondary Verification Process Completed: Yes ?Signs or symptoms of abuse/neglect since last visito No ?Patient Requires Transmission-Based Precautions: No ?Hospitalized since last visit: No ?Patient Has Alerts: No ?Pain Present Now: No ?Electronic Signature(s) ?Signed: 11/15/2021 3:31:07 PM By: [name] EMT ?Entered By: [name] on 11/15/2021 15:31:07 ?-------------------------------------------------------------------------------- ?Encounter Discharge Information Details ?Patient Name: Date of Service: ?[name] [facility] 11/15/2021 1:00 PM ?Medical Record Number: [id number] ?Patient Account Number: [id number] ?Date of Birth/Sex: Treating RN: ?03/18/1966 (56 y.o. M) [name], Bobbi ?Primary Care Rossie Bretado: Clinic, [place] ?Other Clinician: [name] ?Referring Lorella Gomez: ?Treating Harsha Yusko/Extender: [name] ?Clinic, [place] ?Weeks in Treatment: 3 ?Encounter Discharge Information Items ?Discharge Condition: Stable ?Ambulatory Status: Ambulatory ?Discharge Destination: Home ?Transportation: Private  Auto ?Accompanied By: None ?Schedule Follow-up Appointment: Yes ?Clinical Summary of Care: ?Electronic Signature(s) ?Signed: 11/15/2021 3:38:31 PM By: [name] EMT ?Entered By: [name] on 11/15/2021 15:38:30 ?-------------------------------------------------------------------------------- ?Vitals Details ?Patient Name: ?Date of Service: ?[name] [facility] 11/15/2021 1:00 PM ?Medical Record Number: [id number] ?Patient Account Number: [id number] ?Date of Birth/Sex: ?Treating RN: ?[birth year] (56 y.o. M) [name], Bobbi ?Primary Care Canary Fister: Clinic, [place] ?Other Clinician: [name] ?Referring Chrissi Crow: ?Treating Amoni Morales/Extender: [name] ?Clinic, [place] ?Weeks in Treatment: 3 ?Vital Signs ?Time Taken: 12:49 ?Temperature (??F): 98.2 ?Height (in): 69 ?Pulse (bpm): 101 ?Weight (lbs): 180 ?Respiratory Rate (breaths/min): 16 ?Body Mass Index (BMI): 26.6 ?Blood Pressure (mmHg): 146/99 ?Capillary Blood Glucose (mg/dl): 662 ?Reference Range: 80 - 120 mg / dl ?Notes ?Dr. Lady Gary informed of patient CBG. ?Electronic Signature(s) ?Signed: 11/15/2021 3:33:04 PM By: [name] EMT ?Entered By: [name] on 11/15/2021 15:33:03 ?

## 2021-11-16 ENCOUNTER — Encounter (HOSPITAL_BASED_OUTPATIENT_CLINIC_OR_DEPARTMENT_OTHER): Payer: No Typology Code available for payment source | Admitting: General Surgery

## 2021-11-16 ENCOUNTER — Encounter (HOSPITAL_COMMUNITY): Payer: No Typology Code available for payment source

## 2021-11-16 ENCOUNTER — Encounter (HOSPITAL_BASED_OUTPATIENT_CLINIC_OR_DEPARTMENT_OTHER): Payer: No Typology Code available for payment source | Admitting: Internal Medicine

## 2021-11-16 DIAGNOSIS — L97514 Non-pressure chronic ulcer of other part of right foot with necrosis of bone: Secondary | ICD-10-CM | POA: Diagnosis not present

## 2021-11-16 DIAGNOSIS — E11621 Type 2 diabetes mellitus with foot ulcer: Secondary | ICD-10-CM | POA: Diagnosis not present

## 2021-11-16 LAB — GLUCOSE, CAPILLARY
Glucose-Capillary: 392 mg/dL — ABNORMAL HIGH (ref 70–99)
Glucose-Capillary: 345 mg/dL — ABNORMAL HIGH (ref 70–99)

## 2021-11-16 NOTE — Progress Notes (Signed)
JAVELL, HAYN ([id number]) Visit Report for 11/16/2021 Problem List Details Patient Name: Date of Service: [name] [facility] 11/16/2021 1:30 PM Medical Record Number: [id number] Patient Account Number: [id number] Date of Birth/Sex: Treating RN: 1965-08-02 (56 y.o. 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, right ankle and foot Facility Procedures CPT4 Code: [id number] Description: G0277-(Facility Use Only) HBOT full body chamber, 95min , ICD-10 Diagnosis Description E11.621 Type 2 diabetes  mellitus with foot ulcer L97.514 Non-pressure chronic ulcer of other part of right foot with necrosis of bone M86.071 Acute  hematogenous osteomyelitis, right ankle and foot Modifier: Quantity: 4 Physician Procedures : CPT4 Code Description Modifier [id number] - WC PHYS HYPERBARIC OXYGEN THERAPY ICD-10 Diagnosis Description E11.621 Type 2 diabetes mellitus with foot ulcer L97.514 Non-pressure chronic ulcer of other part of right foot with necrosis of bone M86.071  Acute hematogenous osteomyelitis, right ankle and foot Quantity: 1 Electronic Signature(s) Signed: 11/16/2021 3:43:30 PM By: Valeria Batman EMT Signed: 11/16/2021 4:11:47 PM By: Fredirick Maudlin MD FACS Entered By: Valeria Batman on 11/16/2021 15:43:29

## 2021-11-16 NOTE — Progress Notes (Addendum)
FARID, GRIGORIAN ([id number]) Visit Report for 11/16/2021 Arrival Information Details Patient Name: Date of Service: [name] [facility] 11/16/2021 1:30 PM Medical Record Number: [id number] Patient Account Number: [id number] Date of Birth/Sex: Treating RN: 12/22/1965 (56 y.o. [name] Primary Care Paiden Cavell: [facility], [name] Other Clinician: [name] Referring Arianna Delsanto: Treating Roya Gieselman/Extender: [name] Clinic, [name] in Treatment: 4 Visit Information History Since Last Visit All ordered tests and consults were completed: Yes Patient Arrived: Ambulatory Added or deleted any medications: No Arrival Time: 12:38 Any new allergies or adverse reactions: No Accompanied By: None Had a fall or experienced change in No Transfer Assistance: None activities of daily living that may affect Patient Identification Verified: Yes risk of falls: Secondary Verification Process Completed: Yes Signs or symptoms of abuse/neglect since last visito No Patient Requires Transmission-Based Precautions: No Hospitalized since last visit: No Patient Has Alerts: No Implantable device outside of the clinic excluding No cellular tissue based products placed in the center since last visit: Pain Present Now: No Electronic Signature(s) Signed: 11/16/2021 2:03:54 PM By: [name] EMT Entered By: [name] on 11/16/2021 14:03:53 -------------------------------------------------------------------------------- Encounter Discharge Information Details Patient Name: Date of Service: [name], [name] 11/16/2021 1:30 PM Medical Record Number: [id number] Patient Account Number: [id number] Date of Birth/Sex: Treating RN: 22-Apr-1966 (56 y.o. [name] Primary Care Graiden Henes: [facility], [name] Other Clinician: [name] Referring Jozi Malachi: Treating Lekisha Mcghee/Extender: [name] Clinic, [name] in Treatment: 4 Encounter Discharge Information  Items Discharge Condition: Stable Ambulatory Status: Ambulatory Discharge Destination: Home Transportation: Private Auto Accompanied By: None Schedule Follow-up Appointment: Yes Clinical Summary of Care: Electronic Signature(s) Signed: 11/16/2021 3:48:00 PM By: [name] EMT Entered By: [name] on 11/16/2021 15:48:00 -------------------------------------------------------------------------------- Vitals Details Patient Name: Date of Service: [name], [name] 11/16/2021 1:30 PM Medical Record Number: [id number] Patient Account Number: [id number] Date of Birth/Sex: Treating RN: 1966/06/09 (56 y.o. [name] Primary Care Mannie Wineland: [name] Other Clinician: [name] Referring Laverna Dossett: Treating Taden Witter/Extender: [name] [facility], [name] in Treatment: 4 Vital Signs Time Taken: 13:20 Temperature (F): 97.9 Height (in): 69 Pulse (bpm): 94 Weight (lbs): 180 Respiratory Rate (breaths/min): 16 Body Mass Index (BMI): 26.6 Blood Pressure (mmHg): 145/93 Capillary Blood Glucose (mg/dl): 826 Reference Range: 80 - 120 mg / dl Notes The patient had a wound care clinic visit, before today's HBO treatment. Vital signs were from wound care clinic visit. Electronic Signature(s) Signed: 11/16/2021 2:06:10 PM By: [name] EMT Entered By: [name] on 11/16/2021 14:06:10

## 2021-11-16 NOTE — Progress Notes (Addendum)
[name] ([id number]) Visit Report for 11/16/2021 HBO Details Patient Name: Date of Service: [name] [facility] 2 11/16/2021 1:30 PM Medical Record Number: [id number] Patient Account Number: [id number] Date of Birth/Sex: Treating RN: June 12, 1966 (56 y.o. [name] Primary Care [name]: Clinic, [name] Other Clinician: Karl [name] Referring [name]: Treating [name]: [name] Clinic, [name] in Treatment: 4 HBO Treatment Course Details Treatment Course Number: 1 Ordering [name]: [name] T Treatments Ordered: otal 40 HBO Treatment Start Date: 11/06/2021 HBO Indication: Diabetic Ulcer(s) of the Lower Extremity Standard/Conservative Wound Care tried and failed greater than or equal to 30 days Wound #1 Right, Medial T Great oe HBO Treatment Details Treatment Number: 8 Patient Type: Outpatient Chamber Type: Monoplace Chamber Serial #: [id number] Treatment Protocol: 2.0 ATA with 90 minutes oxygen, with two 5 minute air breaks Treatment Details Compression Rate Down: 2.0 psi / minute De-Compression Rate Up: A breaks and breathing ir Compress Tx Pressure periods Decompress Decompress Begins Reached (leave unused spaces Begins Ends blank) Chamber Pressure (ATA 1 2 2 2 2 2  --2 1 ) Clock Time (24 hr) 13:28 13:35 14:05 14:10 14:41 14:46 - - 15:16 15:22 Treatment Length: 114 (minutes) Treatment Segments: 4 Vital Signs Capillary Blood Glucose Reference Range: 80 - 120 mg / dl HBO Diabetic Blood Glucose Intervention Range: <131 mg/dl or >782 mg/dl Time Vitals Blood Respiratory Capillary Blood Glucose Pulse Action Type: Pulse: Temperature: Taken: Pressure: Rate: Glucose (mg/dl): Meter #: Oximetry (%) Taken: Pre 13:20 145/93 94 16 97.9 392 Post 15:28 140/91 89 16 98.7 345 Treatment Response Treatment Toleration: Well Treatment Completion Status: Treatment Completed without Adverse Event Treatment Notes Treatment went well  today, with no problems noted. Additional Procedure Documentation Tissue Sevierity: Necrosis of bone Physician HBO Attestation: I certify that I supervised this HBO treatment in accordance with Medicare guidelines. A trained emergency response team is readily available per Yes hospital policies and procedures. Continue HBOT as ordered. Yes Electronic [name](s) Signed: 11/16/2021 4:11:28 PM By: Christian Guess MD FACS [name]: 11/16/2021 3:43:05 PM Version By: [name] EMT [name]: 11/16/2021 2:11:48 PM Version By: [name] EMT Entered By: [name] on 11/16/2021 16:11:28 -------------------------------------------------------------------------------- HBO Safety Checklist Details Patient Name: Date of Service: [name] 11/16/2021 1:30 PM Medical Record Number: [id number] Patient Account Number: [id number] Date of Birth/Sex: Treating RN: Jan 08, 1966 (56 y.o. [name] Primary Care [name]: Clinic, [name] Other Clinician: Karl [name] Referring [name]: Treating [name]: [name] Clinic, [name] in Treatment: 4 HBO Safety Checklist Items Safety Checklist Consent Form Signed Patient voided / foley secured and emptied When did you last eato 1000 Last dose of injectable or oral agent Monday 11/13/2021 Ostomy pouch emptied and vented if applicable NA All implantable devices assessed, documented and approved NA Intravenous access site secured and place NA Valuables secured Linens and cotton and cotton/polyester blend (less than 51% polyester) Personal oil-based products / skin lotions / body lotions removed Wigs or hairpieces removed NA Smoking or tobacco materials removed Books / newspapers / magazines / loose paper removed Cologne, aftershave, perfume and deodorant removed Jewelry removed (may wrap wedding band) NA Make-up removed NA Hair care products removed Battery operated  devices (external) removed Heating patches and chemical warmers removed Titanium eyewear removed NA Frames approver by [employer] Nail polish cured greater than 10 hours NA Casting material cured greater than 10 hours NA Hearing aids removed NA Loose dentures or partials removed NA Prosthetics have been removed NA Patient demonstrates correct use  of air break device (if applicable) Patient concerns have been addressed Patient grounding bracelet on and cord attached to chamber Specifics for Inpatients (complete in addition to above) Medication sheet sent with patient NA Intravenous medications needed or due during therapy sent with patient NA Drainage tubes (e.g. nasogastric tube or chest tube secured and vented) NA Endotracheal or Tracheotomy tube secured NA [name] deflated of air and inflated with saline NA Airway suctioned NA Notes The Safety checklist was done before treatment started. Electronic [name](s) Signed: 11/16/2021 2:09:34 PM By: [name] EMT Entered By: [name] on 11/16/2021 14:09:33

## 2021-11-17 ENCOUNTER — Ambulatory Visit (INDEPENDENT_AMBULATORY_CARE_PROVIDER_SITE_OTHER): Payer: No Typology Code available for payment source | Admitting: Ophthalmology

## 2021-11-17 ENCOUNTER — Encounter (INDEPENDENT_AMBULATORY_CARE_PROVIDER_SITE_OTHER): Payer: Self-pay | Admitting: Ophthalmology

## 2021-11-17 ENCOUNTER — Encounter (HOSPITAL_BASED_OUTPATIENT_CLINIC_OR_DEPARTMENT_OTHER): Payer: No Typology Code available for payment source | Admitting: General Surgery

## 2021-11-17 DIAGNOSIS — I10 Essential (primary) hypertension: Secondary | ICD-10-CM

## 2021-11-17 DIAGNOSIS — E113413 Type 2 diabetes mellitus with severe nonproliferative diabetic retinopathy with macular edema, bilateral: Secondary | ICD-10-CM | POA: Diagnosis not present

## 2021-11-17 DIAGNOSIS — H3582 Retinal ischemia: Secondary | ICD-10-CM

## 2021-11-17 DIAGNOSIS — H35033 Hypertensive retinopathy, bilateral: Secondary | ICD-10-CM | POA: Diagnosis not present

## 2021-11-17 DIAGNOSIS — E11621 Type 2 diabetes mellitus with foot ulcer: Secondary | ICD-10-CM | POA: Diagnosis not present

## 2021-11-17 DIAGNOSIS — H25813 Combined forms of age-related cataract, bilateral: Secondary | ICD-10-CM

## 2021-11-17 LAB — GLUCOSE, CAPILLARY
Glucose-Capillary: 337 mg/dL — ABNORMAL HIGH (ref 70–99)
Glucose-Capillary: 448 mg/dL — ABNORMAL HIGH (ref 70–99)

## 2021-11-17 NOTE — Progress Notes (Addendum)
MUSCAB, GOODLOW ([id number]) Visit Report for 11/17/2021 HBO Details Patient Name: Date of Service: [name] [facility] 11/17/2021 11:30 A M Medical Record Number: [id number] Patient Account Number: [id number] Date of Birth/Sex: Treating RN: [birth year] (56 y.o. [name] Primary Care Jamise Pentland: Clinic, [name] Other Clinician: [name] Referring Jerrold Haskell: Treating Chaske Paskett/Extender: [name] Clinic, [name] in Treatment: 4 HBO Treatment Course Details Treatment Course Number: 1 Ordering Brandom Kerwin: [name] T Treatments Ordered: otal 40 HBO Treatment Start Date: 11/06/2021 HBO Indication: Diabetic Ulcer(s) of the Lower Extremity Standard/Conservative Wound Care tried and failed greater than or equal to 30 days Wound #1 Right, Medial T Great oe HBO Treatment Details Treatment Number: 9 Patient Type: Outpatient Chamber Type: Monoplace Chamber Serial #: [id number] Treatment Protocol: 2.0 ATA with 90 minutes oxygen, with two 5 minute air breaks Treatment Details Compression Rate Down: 2.0 psi / minute De-Compression Rate Up: A breaks and breathing ir Compress Tx Pressure periods Decompress Decompress Begins Reached (leave unused spaces Begins Ends blank) Chamber Pressure (ATA 1 2 2 2 2 2  --2 1 ) Clock Time (24 hr) 11:49 11:57 12:27 12:32 13:02 13:07 - - 13:37 13:43 Treatment Length: 114 (minutes) Treatment Segments: 4 Vital Signs Capillary Blood Glucose Reference Range: 80 - 120 mg / dl HBO Diabetic Blood Glucose Intervention Range: <131 mg/dl or >564 mg/dl Time Vitals Blood Respiratory Capillary Blood Glucose Pulse Action Type: Pulse: Temperature: Taken: Pressure: Rate: Glucose (mg/dl): Meter #: Oximetry (%) Taken: Pre 11:44 142/96 90 18 98.1 Post 13:55 155/100 94 16 97.3 448 Treatment Response Treatment Toleration: Well Treatment Completion Status: Treatment Completed without Adverse Event Treatment Notes Dr. Lady Gary informed of pre  and post CBG. No problems noted from today's treatment. Additional Procedure Documentation Tissue Sevierity: Fat layer exposed Physician HBO Attestation: I certify that I supervised this HBO treatment in accordance with Medicare guidelines. A trained emergency response team is readily available per Yes hospital policies and procedures. Continue HBOT as ordered. Yes Electronic Signature(s) Signed: 11/20/2021 7:41:58 AM By: [name] MD FACS Previous Signature: 11/17/2021 2:18:44 PM Version By: [name] EMT Previous Signature: 11/17/2021 2:18:20 PM Version By: [name] EMT Previous Signature: 11/17/2021 12:15:18 PM Version By: [name] CHT EMT BS , , Previous Signature: 11/17/2021 12:15:18 PM Version By: [name] CHT EMT BS , , Previous Signature: 11/17/2021 12:16:20 PM Version By: [name] MD FACS Entered By: [name] on 11/20/2021 07:41:58 -------------------------------------------------------------------------------- HBO Safety Checklist Details Patient Name: Date of Service: [name] RDAlvino [facility] 11/17/2021 11:30 A M Medical Record Number: [id number] Patient Account Number: [id number] Date of Birth/Sex: Treating RN: 10/13/1965 (56 y.o. [name] Primary Care Lidiya Reise: Clinic, [name] Other Clinician: [name] Referring Tichina Koebel: Treating Beverlyn Mcginness/Extender: [name] Clinic, [name] in Treatment: 4 HBO Safety Checklist Items Safety Checklist Consent Form Signed Patient voided / foley secured and emptied When did you last eato 1139 Last dose of injectable or oral agent 0500 Ostomy pouch emptied and vented if applicable NA All implantable devices assessed, documented and approved NA Intravenous access site secured and place NA Valuables secured Linens and cotton and cotton/polyester blend (less than 51% polyester) Personal oil-based products / skin lotions / body lotions removed Wigs or  hairpieces removed NA Smoking or tobacco materials removed NA Books / newspapers / magazines / loose paper removed Cologne, aftershave, perfume and deodorant removed Jewelry removed (may wrap wedding band) Make-up removed NA Hair care products removed Battery operated devices (external) removed Heating patches and chemical warmers  removed Titanium eyewear removed NA Frames cleared, no titanium Nail polish cured greater than 10 hours NA Casting material cured greater than 10 hours NA Hearing aids removed NA Loose dentures or partials removed NA Prosthetics have been removed NA Patient demonstrates correct use of air break device (if applicable) Patient concerns have been addressed Patient grounding bracelet on and cord attached to chamber Specifics for Inpatients (complete in addition to above) Medication sheet sent with patient NA Intravenous medications needed or due during therapy sent with patient NA Drainage tubes (e.g. nasogastric tube or chest tube secured and vented) NA Endotracheal or Tracheotomy tube secured NA Cuff deflated of air and inflated with saline NA Airway suctioned NA Notes Paper version used prior to treatment. Electronic Signature(s) Signed: 11/17/2021 12:15:18 PM By: [name] CHT EMT BS , , Entered By: [name] on 11/17/2021 16:10:96

## 2021-11-17 NOTE — Progress Notes (Addendum)
[name], [name] ([id number]) Visit Report for 11/17/2021 Arrival Information Details Patient Name: Date of Service: [name] [facility] 11/17/2021 11:30 A M Medical Record Number: [id number] Patient Account Number: [id number] Date of Birth/Sex: Treating RN: 10-Jan-1966 (56 y.o. [name] Primary Care Pasqualina Colasurdo: Clinic, [name] Other Clinician: [name] Referring Odesser Tourangeau: Treating Tyliah Schlereth/Extender: [name], [name] in Treatment: 4 Visit Information History Since Last Visit All ordered tests and consults were completed: Yes Patient Arrived: Ambulatory Added or deleted any medications: No Arrival Time: 11:36 Any new allergies or adverse reactions: No Accompanied By: self Had a fall or experienced change in No Transfer Assistance: None activities of daily living that may affect Patient Identification Verified: Yes risk of falls: Secondary Verification Process Completed: Yes Signs or symptoms of abuse/neglect since last visito No Patient Requires Transmission-Based Precautions: No Hospitalized since last visit: No Patient Has Alerts: No Implantable device outside of the clinic excluding No cellular tissue based products placed in the center since last visit: Pain Present Now: No Electronic Signature(s) Signed: 11/17/2021 12:15:18 PM By: [name] CHT EMT BS , , Entered By: [name] on 11/17/2021 12:06:16 -------------------------------------------------------------------------------- Encounter Discharge Information Details Patient Name: Date of Service: [name], [name] 11/17/2021 11:30 A M Medical Record Number: [id number] Patient Account Number: [id number] Date of Birth/Sex: Treating RN: 11-Jan-1966 (56 y.o. [name] Primary Care Styles Fambro: Clinic, [name] Other Clinician: [name] Referring Fatumata Kashani: Treating Kameko Hukill/Extender: [name] Clinic, [name] in Treatment:  4 Encounter Discharge Information Items Discharge Condition: Stable Ambulatory Status: Ambulatory Discharge Destination: Home Transportation: Other Accompanied By: None Schedule Follow-up Appointment: Yes Clinical Summary of Care: Electronic Signature(s) Signed: 11/17/2021 2:20:47 PM By: [name] EMT Entered By: [name] on 11/17/2021 14:20:47 -------------------------------------------------------------------------------- Vitals Details Patient Name: Date of Service: [name], [name] 11/17/2021 11:30 A M Medical Record Number: [id number] Patient Account Number: [id number] Date of Birth/Sex: Treating RN: December 12, 1965 (56 y.o. [name] Primary Care Saud Bail: Clinic, [name] Other Clinician: [name] Referring Georgie Eduardo: Treating Joee Iovine/Extender: [name], [name] in Treatment: 4 Vital Signs Time Taken: 11:44 Temperature (F): 98.1 Height (in): 69 Pulse (bpm): 90 Weight (lbs): 180 Respiratory Rate (breaths/min): 18 Body Mass Index (BMI): 26.6 Blood Pressure (mmHg): 142/96 Reference Range: 80 - 120 mg / dl Notes manual BP Electronic Signature(s) Signed: 11/17/2021 12:15:18 PM By: [name] CHT EMT BS , , Entered By: [name] on 11/17/2021 12:06:45

## 2021-11-17 NOTE — Progress Notes (Signed)
PENG, THORSTENSON ([id number]) Visit Report for 11/16/2021 Arrival Information Details Patient Name: Date of Service: [name] 11/16/2021 12:30 PM Medical Record Number: [id number] Patient Account Number: [id number] Date of Birth/Sex: Treating RN: 01-31-1966 (56 y.o. [name] Primary Care Adithya Difrancesco: [name] Other Clinician: Referring Kveon Casanas: Treating Kalyb Pemble/Extender: [facility], [name] in Treatment: 4 Visit Information History Since Last Visit Added or deleted any medications: Yes Patient Arrived: Ambulatory Any new allergies or adverse reactions: No Arrival Time: 12:38 Had a fall or experienced change in No Transfer Assistance: None activities of daily living that may affect Patient Identification Verified: Yes risk of falls: Secondary Verification Process Completed: Yes Signs or symptoms of abuse/neglect since last visito No Patient Requires Transmission-Based Precautions: No Hospitalized since last visit: No Patient Has Alerts: No Implantable device outside of the clinic excluding No cellular tissue based products placed in the center since last visit: Has Dressing in Place as Prescribed: Yes Pain Present Now: No Electronic Signature(s) Signed: 11/16/2021 6:05:14 PM By: Lorrin Jackson Entered By: Lorrin Jackson on 11/16/2021 12:41:37 -------------------------------------------------------------------------------- Encounter Discharge Information Details Patient Name: Date of Service: [name] RD, [place] [facility] 11/16/2021 12:30 PM Medical Record Number: [id number] Patient Account Number: [id number] Date of Birth/Sex: Treating RN: 10-Mar-1966 (56 y.o. [name] Primary Care Kaylenn Civil: [name] Other Clinician: Referring Tynslee Bowlds: Treating Yazmyne Sara/Extender: [facility] [name] in Treatment: 4 Encounter Discharge Information Items Post Procedure Vitals Discharge Condition:  Stable Temperature (F): 97.9 Ambulatory Status: Ambulatory Pulse (bpm): 94 Discharge Destination: Home Respiratory Rate (breaths/min): 16 Transportation: Private Auto Blood Pressure (mmHg): 145/93 Schedule Follow-up Appointment: Yes Clinical Summary of Care: Provided on 11/16/2021 Form Type Recipient Paper Patient Patient Electronic Signature(s) Signed: 11/16/2021 6:05:14 PM By: Lorrin Jackson Entered By: Lorrin Jackson on 11/16/2021 13:15:07 -------------------------------------------------------------------------------- Lower Extremity Assessment Details Patient Name: Date of Service: [name] [facility] 11/16/2021 12:30 PM Medical Record Number: [id number] Patient Account Number: [id number] Date of Birth/Sex: Treating RN: [birth year] (56 y.o. [name] Primary Care Despina Boan: [name] Other Clinician: Referring Deonna Krummel: Treating Kealohilani Maiorino/Extender: [facility], [name] in Treatment: 4 Edema Assessment Assessed: [Left: No] [Right: Yes] Edema: [Left: Ye] [Right: s] Calf Left: Right: Point of Measurement: 37 cm From Medial Instep 38 cm Ankle Left: Right: Point of Measurement: 9 cm From Medial Instep 25.7 cm Vascular Assessment Pulses: Dorsalis Pedis Palpable: [Right:Yes] Electronic Signature(s) Signed: 11/16/2021 6:05:14 PM By: Lorrin Jackson Entered By: Lorrin Jackson on 11/16/2021 12:46:57 -------------------------------------------------------------------------------- Multi Wound Chart Details Patient Name: Date of Service: [name] RD, [name] [facility] 11/16/2021 12:30 PM Medical Record Number: [id number] Patient Account Number: [id number] Date of Birth/Sex: Treating RN: 1966-01-07 (56 y.o. 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] [N/A:N/A] Primary Etiology: [1:Extremity Hypertension, Type II Diabetes,] [N/A:N/A] Comorbid History: [1:Osteomyelitis, Neuropathy 10/10/2021] [N/A:N/A] Date Acquired: [1:4] [N/A:N/A] Weeks of Treatment: [1:Open] [N/A:N/A] Wound Status: [1:No] [N/A:N/A] Wound Recurrence: [1:1.1x1.6x0.3] [N/A:N/A] Measurements L x W x D (cm) [1:1.382] [N/A:N/A] A (cm) : rea [1:0.415] [N/A:N/A] Volume (cm) : [1:70.70%] [N/A:N/A] % Reduction in A [1:rea: 89.00%] [N/A:N/A] % Reduction in Volume: [1:Grade 3] [N/A:N/A] Classification: [1:Medium] [N/A:N/A] Exudate A mount: [1:Serosanguineous] [N/A:N/A] Exudate Type: [  1:red, brown] [N/A:N/A] Exudate Color: [1:Distinct, outline attached] [N/A:N/A] Wound Margin: [1:Large (67-100%)] [N/A:N/A] Granulation A mount: [1:Red, Pink] [N/A:N/A] Granulation Quality: [1:Small (1-33%)] [N/A:N/A] Necrotic A mount: [1:Fat Layer (Subcutaneous Tissue): Yes N/A] Exposed Structures: [1:Fascia: No Tendon: No Muscle: No Joint: No Bone: No None] [N/A:N/A] Epithelialization: [1:Debridement - Excisional] [N/A:N/A] Debridement: Pre-procedure Verification/Time Out 12:56 [N/A:N/A] Taken: [1:Other] [N/A:N/A] Pain Control: [1:Callus, Subcutaneous, Slough] [N/A:N/A] Tissue Debrided: [1:Skin/Subcutaneous Tissue] [N/A:N/A] Level: [1:1.98] [N/A:N/A] Debridement A (sq cm): [1:rea Curette] [N/A:N/A] Instrument: [1:Minimum] [N/A:N/A] Bleeding: [1:Pressure] [N/A:N/A] Hemostasis A chieved: [1:Procedure was tolerated well] [N/A:N/A] Debridement Treatment Response: [1:1.1x1.6x0.3] [N/A:N/A] Post Debridement Measurements L x W x D (cm) [1:0.415] [N/A:N/A] Post Debridement Volume: (cm)  [1:Debridement] [N/A:N/A] Treatment Notes Wound #1 (Toe Great) Wound Laterality: Right, Medial Cleanser Soap and Water Discharge Instruction: May shower and wash wound with dial antibacterial soap and water prior to dressing change. Wound Cleanser Discharge Instruction: Cleanse the wound with wound cleanser prior to applying a clean dressing using gauze sponges, not tissue or cotton balls. Peri-Wound Care Topical Primary Dressing Dakin's Solution 0.25%, 16 (oz) Discharge Instruction: Moisten gauze with Dakin's solution Secondary Dressing Bordered Gauze, 4x4 in Discharge Instruction: Apply over primary dressing as directed. Secured With Conforming Stretch Gauze Bandage, Sterile 2x75 (in/in) Discharge Instruction: Secure with stretch gauze as directed. 49M Medipore H Soft Cloth Surgical T ape, 4 x 10 (in/yd) Discharge Instruction: Secure with tape as directed. Compression Wrap Compression Stockings Add-Ons Electronic Signature(s) Signed: 11/16/2021 1:47:26 PM By: Kalman Shan DO Entered By: Kalman Shan on 11/16/2021 13:39:41 -------------------------------------------------------------------------------- Multi-Disciplinary Care Plan Details Patient Name: Date of Service: [name] RD, [place] [facility] 11/16/2021 12:30 PM Medical Record Number: [id number] Patient Account Number: [id number] Date of Birth/Sex: Treating RN: 09-Feb-1966 (56 y.o. [name] Primary Care Katoya Amato: [name] Other Clinician: Referring Berk Pilot: Treating Bethann Qualley/Extender: [facility], [name] in Treatment: 4 Active Inactive Wound/Skin Impairment Nursing Diagnoses: Impaired tissue integrity Knowledge deficit related to ulceration/compromised skin integrity Goals: Patient will demonstrate a reduced rate of smoking or cessation of smoking Date Initiated: 10/19/2021 Target Resolution Date: 12/02/2021 Goal Status: Active Patient/caregiver will verbalize understanding  of skin care regimen Date Initiated: 10/19/2021 Target Resolution Date: 12/02/2021 Goal Status: Active Ulcer/skin breakdown will have a volume reduction of 30% by week 4 Date Initiated: 10/19/2021 Date Inactivated: 10/31/2021 Target Resolution Date: 11/02/2021 Goal Status: Met Ulcer/skin breakdown will have a volume reduction of 50% by week 8 Date Initiated: 10/31/2021 Target Resolution Date: 11/28/2021 Goal Status: Active Interventions: Assess patient/caregiver ability to obtain necessary supplies Assess patient/caregiver ability to perform ulcer/skin care regimen upon admission and as needed Assess ulceration(s) every visit Provide education on smoking Notes: Electronic Signature(s) Signed: 11/16/2021 6:05:14 PM By: Lorrin Jackson Entered By: Lorrin Jackson on 11/16/2021 12:48:40 -------------------------------------------------------------------------------- Pain Assessment Details Patient Name: Date of Service: [name] RD, [name] [facility] 11/16/2021 12:30 PM Medical Record Number: [id number] Patient Account Number: [id number] Date of Birth/Sex: Treating RN: [birth year] (56 y.o. [name] Primary Care Kham Zuckerman: [name] Other Clinician: Referring Galilea Quito: Treating Anurag Scarfo/Extender: [facility], [name] in Treatment: 4 Active Problems Location of Pain Severity and Description of Pain Patient Has Paino No Site Locations Pain Management and Medication Current Pain Management: Electronic Signature(s) Signed: 11/16/2021 6:05:14 PM By: Lorrin Jackson Entered By: Lorrin Jackson on 11/16/2021 12:46:30 -------------------------------------------------------------------------------- Patient/Caregiver Education Details Patient Name: Date of Service: [name] 5/18/2023andnbsp12:30 PM Medical Record Number: [id number] Patient Account Number: [id number] Date of Birth/Gender: Treating RN: 05-Sep-1965 (56 y.o. [name]  PENG, THORSTENSON ([id number]) Visit Report for 11/16/2021 Arrival Information Details Patient Name: Date of Service: [name] 11/16/2021 12:30 PM Medical Record Number: [id number] Patient Account Number: [id number] Date of Birth/Sex: Treating RN: 01-31-1966 (56 y.o. [name] Primary Care Adithya Difrancesco: [name] Other Clinician: Referring Kveon Casanas: Treating Kalyb Pemble/Extender: [facility], [name] in Treatment: 4 Visit Information History Since Last Visit Added or deleted any medications: Yes Patient Arrived: Ambulatory Any new allergies or adverse reactions: No Arrival Time: 12:38 Had a fall or experienced change in No Transfer Assistance: None activities of daily living that may affect Patient Identification Verified: Yes risk of falls: Secondary Verification Process Completed: Yes Signs or symptoms of abuse/neglect since last visito No Patient Requires Transmission-Based Precautions: No Hospitalized since last visit: No Patient Has Alerts: No Implantable device outside of the clinic excluding No cellular tissue based products placed in the center since last visit: Has Dressing in Place as Prescribed: Yes Pain Present Now: No Electronic Signature(s) Signed: 11/16/2021 6:05:14 PM By: Lorrin Jackson Entered By: Lorrin Jackson on 11/16/2021 12:41:37 -------------------------------------------------------------------------------- Encounter Discharge Information Details Patient Name: Date of Service: [name] RD, [place] [facility] 11/16/2021 12:30 PM Medical Record Number: [id number] Patient Account Number: [id number] Date of Birth/Sex: Treating RN: 10-Mar-1966 (56 y.o. [name] Primary Care Kaylenn Civil: [name] Other Clinician: Referring Tynslee Bowlds: Treating Yazmyne Sara/Extender: [facility] [name] in Treatment: 4 Encounter Discharge Information Items Post Procedure Vitals Discharge Condition:  Stable Temperature (F): 97.9 Ambulatory Status: Ambulatory Pulse (bpm): 94 Discharge Destination: Home Respiratory Rate (breaths/min): 16 Transportation: Private Auto Blood Pressure (mmHg): 145/93 Schedule Follow-up Appointment: Yes Clinical Summary of Care: Provided on 11/16/2021 Form Type Recipient Paper Patient Patient Electronic Signature(s) Signed: 11/16/2021 6:05:14 PM By: Lorrin Jackson Entered By: Lorrin Jackson on 11/16/2021 13:15:07 -------------------------------------------------------------------------------- Lower Extremity Assessment Details Patient Name: Date of Service: [name] RDDenice [facility] 11/16/2021 12:30 PM Medical Record Number: [id number] Patient Account Number: [id number] Date of Birth/Sex: Treating RN: [birth year] (56 y.o. [name] Primary Care Despina Boan: [name] Other Clinician: Referring Deonna Krummel: Treating Kealohilani Maiorino/Extender: [facility] [name] in Treatment: 4 Edema Assessment Assessed: [Left: No] [Right: Yes] Edema: [Left: Ye] [Right: s] Calf Left: Right: Point of Measurement: 37 cm From Medial Instep 38 cm Ankle Left: Right: Point of Measurement: 9 cm From Medial Instep 25.7 cm Vascular Assessment Pulses: Dorsalis Pedis Palpable: [Right:Yes] Electronic Signature(s) Signed: 11/16/2021 6:05:14 PM By: Lorrin Jackson Entered By: Lorrin Jackson on 11/16/2021 12:46:57 -------------------------------------------------------------------------------- Multi Wound Chart Details Patient Name: Date of Service: [name] RD, [name] [facility] 11/16/2021 12:30 PM Medical Record Number: [id number] Patient Account Number: [id number] Date of Birth/Sex: Treating RN: 1966-01-07 (56 y.o. 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] [N/A:N/A] Primary Etiology: [1:Extremity Hypertension, Type II Diabetes,] [N/A:N/A] Comorbid History: [1:Osteomyelitis, Neuropathy 10/10/2021] [N/A:N/A] Date Acquired: [1:4] [N/A:N/A] Weeks of Treatment: [1:Open] [N/A:N/A] Wound Status: [1:No] [N/A:N/A] Wound Recurrence: [1:1.1x1.6x0.3] [N/A:N/A] Measurements L x W x D (cm) [1:1.382] [N/A:N/A] A (cm) : rea [1:0.415] [N/A:N/A] Volume (cm) : [1:70.70%] [N/A:N/A] % Reduction in A [1:rea: 89.00%] [N/A:N/A] % Reduction in Volume: [1:Grade 3] [N/A:N/A] Classification: [1:Medium] [N/A:N/A] Exudate A mount: [1:Serosanguineous] [N/A:N/A] Exudate Type: [

## 2021-11-17 NOTE — Progress Notes (Signed)
shower and wash wound with dial antibacterial soap and water prior to dressing change. Cleanser: Wound Cleanser (Generic) 1 x Per Day/30 Days Discharge Instructions: Cleanse the wound with wound cleanser prior to applying a clean dressing using gauze sponges, not tissue or cotton balls. Prim Dressing: Dakin's Solution 0.25%, 16 (oz) (Generic) 1 x Per Day/30 Days ary Discharge Instructions: Moisten gauze with Dakin's solution Secondary Dressing: Bordered Gauze, 4x4 in (Generic) 1 x Per Day/30 Days Discharge Instructions: Apply over primary dressing as directed. Secured With: Insurance underwriter, Sterile 2x75 (in/in) (Generic) 1 x Per Day/30 Days Discharge Instructions: Secure with stretch gauze as directed. Secured With: 30M Medipore H Soft Cloth Surgical T ape, 4 x 10 (in/yd) (Generic) 1 x Per Day/30 Days Discharge Instructions: Secure with tape as directed. GLYCEMIA INTERVENTIONS PROTOCOL PRE-HBO GLYCEMIA INTERVENTIONS ACTION INTERVENTION Obtain pre-HBO capillary blood glucose (ensure 1 physician order is in chart). A. Notify HBO physician and await physician orders. 2 If result is 70 mg/dl or below: B. If the result meets the hospital definition of a critical result, follow hospital policy. A. Give patient an 8 ounce Glucerna Shake, an 8 ounce Ensure, or 8 ounces of  a Glucerna/Ensure equivalent dietary supplement*. B. Wait 30 minutes. If result is 71 mg/dl to 952 mg/dl: C. Retest patients capillary blood glucose (CBG). D. If result greater than or equal to 110 mg/dl, proceed with HBO. If result less than 110 mg/dl, notify HBO physician and consider holding HBO. If result is 131 mg/dl to 841 mg/dl: A. Proceed with HBO. A. Notify HBO physician and await physician orders. B. It is recommended to hold HBO and do If result is 250 mg/dl or greater: blood/urine ketone testing. C. If the result meets the hospital definition of a critical result, follow hospital policy. POST-HBO GLYCEMIA INTERVENTIONS ACTION INTERVENTION Obtain post HBO capillary blood glucose (ensure 1 physician order is in chart). A. Notify HBO physician and await physician orders. 2 If result is 70 mg/dl or below: B. If the result meets the hospital definition of a critical result, follow hospital policy. A. Give patient an 8 ounce Glucerna Shake, an 8 ounce Ensure, or 8 ounces of a Glucerna/Ensure equivalent dietary supplement*. B. Wait 15 minutes for symptoms of If result is 71 mg/dl to 324 mg/dl: hypoglycemia (i.e. nervousness, anxiety, sweating, chills, clamminess, irritability, confusion, tachycardia or dizziness). C. If patient asymptomatic, discharge patient. If patient symptomatic, repeat capillary blood glucose (CBG) and notify HBO physician. If result is 101 mg/dl to 401 mg/dl: A. Discharge patient. A. Notify HBO physician and await physician orders. B. It is recommended to do blood/urine ketone If result is 250 mg/dl or greater: testing. C. If the result meets the hospital definition of a critical result, follow hospital policy. *Juice or candies are NOT equivalent products. If patient refuses the Glucerna or Ensure, please consult the hospital dietitian for an appropriate substitute. Electronic Signature(s) Signed: 11/16/2021 1:47:26 PM By: [name] DO Entered By: [name] on 11/16/2021 13:43:39 -------------------------------------------------------------------------------- Problem List Details Patient Name: Date of Service: [name], [name] 11/16/2021 12:30 PM Medical Record Number: [id number] Patient Account Number: [id number] Date of Birth/Sex: Treating RN: [birth year] (56 y.o. 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 M) Primary Care Provider: [name] Other Clinician: Referring Provider: Treating Provider/Extender: [name] [name] in Treatment: 4 Information Obtained from: Patient Chief Complaint 10/19/2021; Right great toe wound Electronic Signature(s) Signed: 11/16/2021 1:47:26 PM By: [name] DO Entered By: [name] on 11/16/2021 13:40:19 -------------------------------------------------------------------------------- Debridement Details Patient Name: Date of Service: [name], [name] 11/16/2021 12:30 PM Medical Record Number: [id number] Patient Account Number: [id number] Date of Birth/Sex: Treating RN: [birth year] (56 y.o. Lytle Michaels Primary Care Provider: [name] Other Clinician: Referring Provider: Treating Provider/Extender: [name] [name] in Treatment: 4 Debridement Performed for Assessment: Wound #1 Right,Medial T Great oe Performed By: Physician [name], DO Debridement Type: Debridement Severity of Tissue Pre Debridement: Fat layer exposed Level of Consciousness (Pre-procedure): Awake and Alert Pre-procedure Verification/Time Out Yes - 12:56 Taken: Start Time: 12:57 Pain Control: Other : Benzocaine T Area Debrided (L x W): otal 1.1 (cm) x 1.8 (cm) = 1.98 (cm) Tissue and other material debrided: Non-Viable, Callus, Slough, Subcutaneous, Slough Level: Skin/Subcutaneous Tissue Debridement Description: Excisional Instrument: Curette Bleeding: Minimum Hemostasis Achieved: Pressure End Time: 13:02 Response to Treatment: Procedure was tolerated well Level of Consciousness (Post- Awake and  Alert procedure): Post Debridement Measurements of Total Wound Length: (cm) 1.1 Width: (cm) 1.6 Depth: (cm) 0.3 Volume: (cm) 0.415 Character of Wound/Ulcer Post Debridement: Stable Severity of Tissue Post Debridement: Fat layer exposed Post Procedure Diagnosis Same as Pre-procedure Electronic Signature(s) Signed: 11/16/2021 1:47:26 PM By: [name] DO Signed: 11/16/2021 6:05:14 PM By: Antonieta Iba Entered By: Antonieta Iba on 11/16/2021 13:15:28 -------------------------------------------------------------------------------- HPI Details Patient Name: Date of Service: [name], [name] 11/16/2021 12:30 PM Medical Record Number: [id number] Patient Account Number: [id number] Date of Birth/Sex: Treating RN: [birth year] (55 y.o. 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 He had an MRI of the right foot on 10/16/2021 that showed signal changes in the first proximal and distal phalanx consistent with acute osteomyelitis. He states that he developed a wound spontaneously to this area 10 days ago. He does have significant peripheral neuropathy and does not have sensation to the wound area or bottom of his feet. He reports being on 3 different antibiotics including clindamycin and doxycycline in the past couple of weeks. He is currently on Augmentin based on wound cultures done in the ED. He has currently been keeping the area covered. He currently denies systemic signs of infection. 4/25; patient presents for follow-up. HBO orders have been placed and we are awaiting [facility]. He  obtained his chest x-ray with no cardiopulmonary abnormalities. He has been using Dakin's wet-to-dry dressings to the toe wound. He currently denies systemic signs of infection. He is currently taking Augmentin. He has an infectious disease appointment on 5/2. He has not heard back about his ABIs with TBI's. 5/2; patient presents for follow-up. He has been using Dakin's wet-to-dry dressings without issues. He continues to take Augmentin daily. He saw  JAMEIS, [place] ([id number]) Visit Report for 11/16/2021 Chief Complaint Document Details Patient Name: Date of Service: [name] [facility] 11/16/2021 12:30 PM Medical Record Number: [id number] Patient Account Number: [id number] Date of Birth/Sex: Treating RN: 1966/06/28 (56 y.o. M) Primary Care Provider: [name] Other Clinician: Referring Provider: Treating Provider/Extender: [name] [name] in Treatment: 4 Information Obtained from: Patient Chief Complaint 10/19/2021; Right great toe wound Electronic Signature(s) Signed: 11/16/2021 1:47:26 PM By: [name] DO Entered By: [name] on 11/16/2021 13:40:19 -------------------------------------------------------------------------------- Debridement Details Patient Name: Date of Service: [name], [name] 11/16/2021 12:30 PM Medical Record Number: [id number] Patient Account Number: [id number] Date of Birth/Sex: Treating RN: [birth year] (56 y.o. Lytle Michaels Primary Care Provider: [name] Other Clinician: Referring Provider: Treating Provider/Extender: [name] [name] in Treatment: 4 Debridement Performed for Assessment: Wound #1 Right,Medial T Great oe Performed By: Physician [name], DO Debridement Type: Debridement Severity of Tissue Pre Debridement: Fat layer exposed Level of Consciousness (Pre-procedure): Awake and Alert Pre-procedure Verification/Time Out Yes - 12:56 Taken: Start Time: 12:57 Pain Control: Other : Benzocaine T Area Debrided (L x W): otal 1.1 (cm) x 1.8 (cm) = 1.98 (cm) Tissue and other material debrided: Non-Viable, Callus, Slough, Subcutaneous, Slough Level: Skin/Subcutaneous Tissue Debridement Description: Excisional Instrument: Curette Bleeding: Minimum Hemostasis Achieved: Pressure End Time: 13:02 Response to Treatment: Procedure was tolerated well Level of Consciousness (Post- Awake and  Alert procedure): Post Debridement Measurements of Total Wound Length: (cm) 1.1 Width: (cm) 1.6 Depth: (cm) 0.3 Volume: (cm) 0.415 Character of Wound/Ulcer Post Debridement: Stable Severity of Tissue Post Debridement: Fat layer exposed Post Procedure Diagnosis Same as Pre-procedure Electronic Signature(s) Signed: 11/16/2021 1:47:26 PM By: [name] DO Signed: 11/16/2021 6:05:14 PM By: Antonieta Iba Entered By: Antonieta Iba on 11/16/2021 13:15:28 -------------------------------------------------------------------------------- HPI Details Patient Name: Date of Service: [name], [name] 11/16/2021 12:30 PM Medical Record Number: [id number] Patient Account Number: [id number] Date of Birth/Sex: Treating RN: [birth year] (55 y.o. 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 He had an MRI of the right foot on 10/16/2021 that showed signal changes in the first proximal and distal phalanx consistent with acute osteomyelitis. He states that he developed a wound spontaneously to this area 10 days ago. He does have significant peripheral neuropathy and does not have sensation to the wound area or bottom of his feet. He reports being on 3 different antibiotics including clindamycin and doxycycline in the past couple of weeks. He is currently on Augmentin based on wound cultures done in the ED. He has currently been keeping the area covered. He currently denies systemic signs of infection. 4/25; patient presents for follow-up. HBO orders have been placed and we are awaiting [facility] approval. He  obtained his chest x-ray with no cardiopulmonary abnormalities. He has been using Dakin's wet-to-dry dressings to the toe wound. He currently denies systemic signs of infection. He is currently taking Augmentin. He has an infectious disease appointment on 5/2. He has not heard back about his ABIs with TBI's. 5/2; patient presents for follow-up. He has been using Dakin's wet-to-dry dressings without issues. He continues to take Augmentin daily. He saw  JAMEIS, [place] ([id number]) Visit Report for 11/16/2021 Chief Complaint Document Details Patient Name: Date of Service: [name] [facility] 11/16/2021 12:30 PM Medical Record Number: [id number] Patient Account Number: [id number] Date of Birth/Sex: Treating RN: 1966/06/28 (56 y.o. M) Primary Care Provider: [name] Other Clinician: Referring Provider: Treating Provider/Extender: [name] [name] in Treatment: 4 Information Obtained from: Patient Chief Complaint 10/19/2021; Right great toe wound Electronic Signature(s) Signed: 11/16/2021 1:47:26 PM By: [name] DO Entered By: [name] on 11/16/2021 13:40:19 -------------------------------------------------------------------------------- Debridement Details Patient Name: Date of Service: [name], [name] 11/16/2021 12:30 PM Medical Record Number: [id number] Patient Account Number: [id number] Date of Birth/Sex: Treating RN: [birth year] (56 y.o. Lytle Michaels Primary Care Provider: [name] Other Clinician: Referring Provider: Treating Provider/Extender: [name] [name] in Treatment: 4 Debridement Performed for Assessment: Wound #1 Right,Medial T Great oe Performed By: Physician [name], DO Debridement Type: Debridement Severity of Tissue Pre Debridement: Fat layer exposed Level of Consciousness (Pre-procedure): Awake and Alert Pre-procedure Verification/Time Out Yes - 12:56 Taken: Start Time: 12:57 Pain Control: Other : Benzocaine T Area Debrided (L x W): otal 1.1 (cm) x 1.8 (cm) = 1.98 (cm) Tissue and other material debrided: Non-Viable, Callus, Slough, Subcutaneous, Slough Level: Skin/Subcutaneous Tissue Debridement Description: Excisional Instrument: Curette Bleeding: Minimum Hemostasis Achieved: Pressure End Time: 13:02 Response to Treatment: Procedure was tolerated well Level of Consciousness (Post- Awake and  Alert procedure): Post Debridement Measurements of Total Wound Length: (cm) 1.1 Width: (cm) 1.6 Depth: (cm) 0.3 Volume: (cm) 0.415 Character of Wound/Ulcer Post Debridement: Stable Severity of Tissue Post Debridement: Fat layer exposed Post Procedure Diagnosis Same as Pre-procedure Electronic Signature(s) Signed: 11/16/2021 1:47:26 PM By: [name] DO Signed: 11/16/2021 6:05:14 PM By: Antonieta Iba Entered By: Antonieta Iba on 11/16/2021 13:15:28 -------------------------------------------------------------------------------- HPI Details Patient Name: Date of Service: [name], [name] 11/16/2021 12:30 PM Medical Record Number: [id number] Patient Account Number: [id number] Date of Birth/Sex: Treating RN: [birth year] (55 y.o. 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 He had an MRI of the right foot on 10/16/2021 that showed signal changes in the first proximal and distal phalanx consistent with acute osteomyelitis. He states that he developed a wound spontaneously to this area 10 days ago. He does have significant peripheral neuropathy and does not have sensation to the wound area or bottom of his feet. He reports being on 3 different antibiotics including clindamycin and doxycycline in the past couple of weeks. He is currently on Augmentin based on wound cultures done in the ED. He has currently been keeping the area covered. He currently denies systemic signs of infection. 4/25; patient presents for follow-up. HBO orders have been placed and we are awaiting [facility] approval. He  obtained his chest x-ray with no cardiopulmonary abnormalities. He has been using Dakin's wet-to-dry dressings to the toe wound. He currently denies systemic signs of infection. He is currently taking Augmentin. He has an infectious disease appointment on 5/2. He has not heard back about his ABIs with TBI's. 5/2; patient presents for follow-up. He has been using Dakin's wet-to-dry dressings without issues. He continues to take Augmentin daily. He saw  JAMEIS, [place] ([id number]) Visit Report for 11/16/2021 Chief Complaint Document Details Patient Name: Date of Service: [name] [facility] 11/16/2021 12:30 PM Medical Record Number: [id number] Patient Account Number: [id number] Date of Birth/Sex: Treating RN: 1966/06/28 (56 y.o. M) Primary Care Provider: [name] Other Clinician: Referring Provider: Treating Provider/Extender: [name] [name] in Treatment: 4 Information Obtained from: Patient Chief Complaint 10/19/2021; Right great toe wound Electronic Signature(s) Signed: 11/16/2021 1:47:26 PM By: [name] DO Entered By: [name] on 11/16/2021 13:40:19 -------------------------------------------------------------------------------- Debridement Details Patient Name: Date of Service: [name], [name] 11/16/2021 12:30 PM Medical Record Number: [id number] Patient Account Number: [id number] Date of Birth/Sex: Treating RN: [birth year] (56 y.o. Lytle Michaels Primary Care Provider: [name] Other Clinician: Referring Provider: Treating Provider/Extender: [name] [name] in Treatment: 4 Debridement Performed for Assessment: Wound #1 Right,Medial T Great oe Performed By: Physician [name], DO Debridement Type: Debridement Severity of Tissue Pre Debridement: Fat layer exposed Level of Consciousness (Pre-procedure): Awake and Alert Pre-procedure Verification/Time Out Yes - 12:56 Taken: Start Time: 12:57 Pain Control: Other : Benzocaine T Area Debrided (L x W): otal 1.1 (cm) x 1.8 (cm) = 1.98 (cm) Tissue and other material debrided: Non-Viable, Callus, Slough, Subcutaneous, Slough Level: Skin/Subcutaneous Tissue Debridement Description: Excisional Instrument: Curette Bleeding: Minimum Hemostasis Achieved: Pressure End Time: 13:02 Response to Treatment: Procedure was tolerated well Level of Consciousness (Post- Awake and  Alert procedure): Post Debridement Measurements of Total Wound Length: (cm) 1.1 Width: (cm) 1.6 Depth: (cm) 0.3 Volume: (cm) 0.415 Character of Wound/Ulcer Post Debridement: Stable Severity of Tissue Post Debridement: Fat layer exposed Post Procedure Diagnosis Same as Pre-procedure Electronic Signature(s) Signed: 11/16/2021 1:47:26 PM By: [name] DO Signed: 11/16/2021 6:05:14 PM By: Antonieta Iba Entered By: Antonieta Iba on 11/16/2021 13:15:28 -------------------------------------------------------------------------------- HPI Details Patient Name: Date of Service: [name], [name] 11/16/2021 12:30 PM Medical Record Number: [id number] Patient Account Number: [id number] Date of Birth/Sex: Treating RN: [birth year] (55 y.o. 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 He had an MRI of the right foot on 10/16/2021 that showed signal changes in the first proximal and distal phalanx consistent with acute osteomyelitis. He states that he developed a wound spontaneously to this area 10 days ago. He does have significant peripheral neuropathy and does not have sensation to the wound area or bottom of his feet. He reports being on 3 different antibiotics including clindamycin and doxycycline in the past couple of weeks. He is currently on Augmentin based on wound cultures done in the ED. He has currently been keeping the area covered. He currently denies systemic signs of infection. 4/25; patient presents for follow-up. HBO orders have been placed and we are awaiting [facility] approval. He  obtained his chest x-ray with no cardiopulmonary abnormalities. He has been using Dakin's wet-to-dry dressings to the toe wound. He currently denies systemic signs of infection. He is currently taking Augmentin. He has an infectious disease appointment on 5/2. He has not heard back about his ABIs with TBI's. 5/2; patient presents for follow-up. He has been using Dakin's wet-to-dry dressings without issues. He continues to take Augmentin daily. He saw  Wagner Grade 3/ + osteomyelitis Right Great T oe If appropriate for treatment, begin HBOT per protocol: 2.0 ATA for 90 Minutes with 2 Five (5) Minute Air Breaks T Number of Treatments: - 40 otal One treatments per day (delivered Monday through Friday unless otherwise specified in Special Instructions below): Finger stick Blood Glucose Pre- and Post- HBOT Treatment. Follow Hyperbaric Oxygen Glycemia Protocol Afrin (Oxymetazoline HCL) 0.05% nasal spray - 1 spray in both nostrils daily as needed prior to HBO treatment for difficulty clearing ears WOUND #1: - T Great Wound Laterality: Right, Medial oe Cleanser: Soap and Water 1 x Per Day/30 Days Discharge Instructions: May shower and wash wound with dial antibacterial soap and water prior to dressing change. Cleanser: Wound Cleanser (Generic) 1 x Per Day/30 Days Discharge Instructions: Cleanse the wound with wound cleanser prior to applying a clean dressing using gauze sponges, not tissue or cotton balls. Prim Dressing: Dakin's Solution  0.25%, 16 (oz) (Generic) 1 x Per Day/30 Days ary Discharge Instructions: Moisten gauze with Dakin's solution Secondary Dressing: Bordered Gauze, 4x4 in (Generic) 1 x Per Day/30 Days Discharge Instructions: Apply over primary dressing as directed. Secured With: Insurance underwriter, Sterile 2x75 (in/in) (Generic) 1 x Per Day/30 Days Discharge Instructions: Secure with stretch gauze as directed. Secured With: 6M Medipore H Soft Cloth Surgical T ape, 4 x 10 (in/yd) (Generic) 1 x Per Day/30 Days Discharge Instructions: Secure with tape as directed. 1. In office sharp debridement 2. Dakin's wet-to-dry dressings 3. Follow-up in 1 week 4. Continue antibiotics per ID 5. Continue HBO therapy Electronic Signature(s) Signed: 11/16/2021 1:47:26 PM By: [name] DO Entered By: [name] on 11/16/2021 13:46:32 -------------------------------------------------------------------------------- HxROS Details Patient Name: Date of Service: [name], [name] 11/16/2021 12:30 PM Medical Record Number: [id number] Patient Account Number: [id number] Date of Birth/Sex: Treating RN: September 27, 1965 (56 y.o. M) Primary Care Provider: [name] Other Clinician: Referring Provider: Treating Provider/Extender: [name] [name] in Treatment: 4 Information Obtained From Patient Caregiver Chart Respiratory Medical History: Negative for: Aspiration; Asthma; Chronic Obstructive Pulmonary Disease (COPD) Cardiovascular Medical History: Positive for: Hypertension Negative for: Angina; Arrhythmia; Congestive Heart Failure; Coronary Artery Disease; Deep Vein Thrombosis; Hypotension; Myocardial Infarction; Peripheral Arterial Disease; Peripheral Venous Disease; Phlebitis; Vasculitis Endocrine Medical History: Positive for: Type II Diabetes Treated with: Insulin Blood sugar tested every day: No Musculoskeletal Medical History: Positive for: Osteomyelitis -  Right [place]!!! Neurologic Medical History: Positive for: Neuropathy Psychiatric Medical History: Past Medical History Notes: PTSD Immunizations Pneumococcal Vaccine: Received Pneumococcal Vaccination: No Implantable Devices None Hospitalization / Surgery History Type of Hospitalization/Surgery I and D back Family and Social History Unknown History: Yes; Current some day smoker - cigar; Alcohol Use: Never; Drug Use: No History; Caffeine Use: Moderate; Financial Concerns: No; Food, Clothing or Shelter Needs: No; Support System Lacking: No; Transportation Concerns: No Electronic Signature(s) Signed: 11/16/2021 1:47:26 PM By: [name] DO Entered By: [name] on 11/16/2021 13:42:56 -------------------------------------------------------------------------------- SuperBill Details Patient Name: Date of Service: [name], [name] 11/16/2021 Medical Record Number: [id number] Patient Account Number: [id number] Date of Birth/Sex: Treating RN: 19-Jun-1966 (56 y.o. 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 Cleanser: Wound Cleanser (Generic) 1 x Per Day/30 Days Discharge Instructions: Cleanse the wound with wound cleanser prior to applying a clean dressing using gauze sponges, not tissue or cotton balls. Prim Dressing: Dakin's Solution 0.25%, 16 (oz) (Generic) 1 x Per Day/30 Days ary Discharge Instructions: Moisten gauze with Dakin's solution Secondary Dressing: Bordered Gauze, 4x4 in (Generic) 1 x Per Day/30 Days Discharge Instructions: Apply over primary dressing as directed. Secured With: Insurance underwriter, Sterile 2x75 (in/in) (Generic) 1 x Per Day/30 Days Discharge Instructions: Secure with stretch gauze as directed. Secured With: 30M Medipore H Soft Cloth Surgical T ape, 4 x 10 (in/yd) (Generic) 1 x Per Day/30 Days Discharge Instructions: Secure with tape as directed. GLYCEMIA INTERVENTIONS PROTOCOL PRE-HBO GLYCEMIA INTERVENTIONS ACTION INTERVENTION Obtain pre-HBO capillary blood glucose (ensure 1 physician order is in chart). A. Notify HBO physician and await physician orders. 2 If result is 70 mg/dl or below: B. If the result meets the hospital definition of a critical result, follow hospital policy. A. Give patient an 8 ounce Glucerna Shake, an 8 ounce Ensure, or 8 ounces of  a Glucerna/Ensure equivalent dietary supplement*. B. Wait 30 minutes. If result is 71 mg/dl to 952 mg/dl: C. Retest patients capillary blood glucose (CBG). D. If result greater than or equal to 110 mg/dl, proceed with HBO. If result less than 110 mg/dl, notify HBO physician and consider holding HBO. If result is 131 mg/dl to 841 mg/dl: A. Proceed with HBO. A. Notify HBO physician and await physician orders. B. It is recommended to hold HBO and do If result is 250 mg/dl or greater: blood/urine ketone testing. C. If the result meets the hospital definition of a critical result, follow hospital policy. POST-HBO GLYCEMIA INTERVENTIONS ACTION INTERVENTION Obtain post HBO capillary blood glucose (ensure 1 physician order is in chart). A. Notify HBO physician and await physician orders. 2 If result is 70 mg/dl or below: B. If the result meets the hospital definition of a critical result, follow hospital policy. A. Give patient an 8 ounce Glucerna Shake, an 8 ounce Ensure, or 8 ounces of a Glucerna/Ensure equivalent dietary supplement*. B. Wait 15 minutes for symptoms of If result is 71 mg/dl to 324 mg/dl: hypoglycemia (i.e. nervousness, anxiety, sweating, chills, clamminess, irritability, confusion, tachycardia or dizziness). C. If patient asymptomatic, discharge patient. If patient symptomatic, repeat capillary blood glucose (CBG) and notify HBO physician. If result is 101 mg/dl to 401 mg/dl: A. Discharge patient. A. Notify HBO physician and await physician orders. B. It is recommended to do blood/urine ketone If result is 250 mg/dl or greater: testing. C. If the result meets the hospital definition of a critical result, follow hospital policy. *Juice or candies are NOT equivalent products. If patient refuses the Glucerna or Ensure, please consult the hospital dietitian for an appropriate substitute. Electronic Signature(s) Signed: 11/16/2021 1:47:26 PM By: [name] DO Entered By: [name] on 11/16/2021 13:43:39 -------------------------------------------------------------------------------- Problem List Details Patient Name: Date of Service: [name], [name] 11/16/2021 12:30 PM Medical Record Number: [id number] Patient Account Number: [id number] Date of Birth/Sex: Treating RN: [birth year] (56 y.o. 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

## 2021-11-20 ENCOUNTER — Encounter (HOSPITAL_BASED_OUTPATIENT_CLINIC_OR_DEPARTMENT_OTHER): Payer: No Typology Code available for payment source | Admitting: Internal Medicine

## 2021-11-20 ENCOUNTER — Encounter (INDEPENDENT_AMBULATORY_CARE_PROVIDER_SITE_OTHER): Payer: Self-pay | Admitting: Ophthalmology

## 2021-11-20 DIAGNOSIS — E11621 Type 2 diabetes mellitus with foot ulcer: Secondary | ICD-10-CM | POA: Diagnosis not present

## 2021-11-20 LAB — GLUCOSE, CAPILLARY: Glucose-Capillary: 410 mg/dL — ABNORMAL HIGH (ref 70–99)

## 2021-11-20 MED ORDER — BEVACIZUMAB CHEMO INJECTION 1.25MG/0.05ML SYRINGE FOR KALEIDOSCOPE
1.2500 mg | INTRAVITREAL | Status: AC | PRN
  Administered 2021-11-20: 1.25 mg via INTRAVITREAL

## 2021-11-20 NOTE — Progress Notes (Signed)
ULRIK, GUIRGUIS ([id number]) Visit Report for 11/17/2021 Problem List Details Patient Name: Date of Service: [name] [facility] 11/17/2021 11:30 A M Medical Record Number: [id number] Patient Account Number: [id number] Date of Birth/Sex: Treating RN: [birth year] (56 y.o. 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 11/17/2021 Medical Record Number: [id number] Patient Account Number: [id number] Date of Birth/Sex: Treating RN: April 22, 1966 (56 y.o. 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, right ankle and foot Facility Procedures CPT4 Code: [id number] Description: G0277-(Facility Use Only) HBOT full body chamber, 72min , ICD-10 Diagnosis Description E11.621 Type 2 diabetes  mellitus with foot ulcer L97.514 Non-pressure chronic ulcer of other part of right foot with necrosis of bone M86.071 Acute  hematogenous osteomyelitis, right ankle and foot Modifier: Quantity: 4 Physician Procedures : CPT4 Code Description Modifier [id number] - WC PHYS HYPERBARIC OXYGEN THERAPY ICD-10 Diagnosis Description E11.621 Type 2 diabetes mellitus with foot ulcer L97.514 Non-pressure chronic ulcer of other part of right foot with necrosis of bone M86.071  Acute hematogenous osteomyelitis, right ankle and foot Quantity: 1 Electronic Signature(s) Signed: 11/17/2021 2:19:10 PM By: Valeria Batman EMT Signed: 11/20/2021 7:39:29 AM By: Fredirick Maudlin MD FACS Entered By: Valeria Batman on 11/17/2021 14:19:10

## 2021-11-20 NOTE — Progress Notes (Signed)
SHYHEIM, TANNEY ([id number]) Visit Report for 11/20/2021 HBO Safety Checklist Details Patient Name: Date of Service: [name] [facility] 11/20/2021 10:00 A M Medical Record Number: [id number] Patient Account Number: [id number] Date of Birth/Sex: Treating RN: [birth year] (56 y.o. [name] Primary Care Kemara Quigley: [name] Other Clinician: [name] Referring Madigan Rosensteel: Treating Ammi Hutt/Extender: [name], [name] in Treatment: 4 HBO Safety Checklist Items Safety Checklist Consent Form Signed Patient voided / foley secured and emptied When did you last eato 0930 Last dose of injectable or oral agent 0700 Ostomy pouch emptied and vented if applicable NA All implantable devices assessed, documented and approved NA Intravenous access site secured and place NA Valuables secured Linens and cotton and cotton/polyester blend (less than 51% polyester) Personal oil-based products / skin lotions / body lotions removed Wigs or hairpieces removed NA Smoking or tobacco materials removed NA Books / newspapers / magazines / loose paper removed Cologne, aftershave, perfume and deodorant removed Jewelry removed (may wrap wedding band) Make-up removed NA Hair care products removed Battery operated devices (external) removed Heating patches and chemical warmers removed Titanium eyewear removed Nail polish cured greater than 10 hours NA Casting material cured greater than 10 hours NA Hearing aids removed NA Loose dentures or partials removed NA Prosthetics have been removed NA Patient demonstrates correct use of air break device (if applicable) Patient concerns have been addressed Patient grounding bracelet on and cord attached to chamber Specifics for Inpatients (complete in addition to above) Medication sheet sent with patient NA Intravenous medications needed or due during therapy sent with patient NA Drainage tubes (e.g. nasogastric  tube or chest tube secured and vented) NA Endotracheal or Tracheotomy tube secured NA Cuff deflated of air and inflated with saline NA Airway suctioned NA Notes Paper version used prior to treatment. Electronic Signature(s) Signed: 11/20/2021 10:53:58 AM By: [name] CHT EMT BS , , Entered By: [name] on 11/20/2021 10:53:58

## 2021-11-20 NOTE — Progress Notes (Addendum)
[name] ([id number]) Visit Report for 11/20/2021 Arrival Information Details Patient Name: Date of Service: [name] [facility] 11/20/2021 10:00 A M Medical Record Number: [id number] Patient Account Number: [id number] Date of Birth/Sex: Treating RN: 09-12-1965 (56 y.o. [name] Primary Care Ivory Bail: [name] Other Clinician: [name] Referring Mckinnley Cottier: Treating Renate Danh/Extender: [name], [name] in Treatment: 4 Visit Information History Since Last Visit All ordered tests and consults were completed: Yes Patient Arrived: Ambulatory Added or deleted any medications: No Arrival Time: 09:34 Any new allergies or adverse reactions: No Accompanied By: self Had a fall or experienced change in No Transfer Assistance: None activities of daily living that may affect Patient Identification Verified: Yes risk of falls: Secondary Verification Process Completed: Yes Signs or symptoms of abuse/neglect since last visito No Patient Requires Transmission-Based Precautions: No Hospitalized since last visit: No Patient Has Alerts: No Implantable device outside of the clinic excluding No cellular tissue based products placed in the center since last visit: Pain Present Now: No Electronic Signature(s) Signed: 11/20/2021 10:52:40 AM By: [name] CHT EMT BS , , Entered By: [name] on 11/20/2021 10:52:40 -------------------------------------------------------------------------------- Clinic Level of Care Assessment Details Patient Name: Date of Service: [name] [facility] 11/20/2021 10:00 A M Medical Record Number: [id number] Patient Account Number: [id number] Date of Birth/Sex: Treating RN: 1965-08-18 (56 y.o. [name] Primary Care Marguerita Stapp: [name] Other Clinician: [name] Referring Monic Engelmann: Treating Early Steel/Extender: [name], [name] in Treatment: 4 Clinic  Level of Care Assessment Items TOOL 4 Quantity Score []  - 0 Use when only an EandM is performed on FOLLOW-UP visit ASSESSMENTS - Nursing Assessment / Reassessment []  - 0 Reassessment of Co-morbidities (includes updates in patient status) []  - 0 Reassessment of Adherence to Treatment Plan ASSESSMENTS - Wound and Skin A ssessment / Reassessment []  - 0 Simple Wound Assessment / Reassessment - one wound []  - 0 Complex Wound Assessment / Reassessment - multiple wounds []  - 0 Dermatologic / Skin Assessment (not related to wound area) ASSESSMENTS - Focused Assessment []  - 0 Circumferential Edema Measurements - multi extremities []  - 0 Nutritional Assessment / Counseling / Intervention []  - 0 Lower Extremity Assessment (monofilament, tuning fork, pulses) []  - 0 Peripheral Arterial Disease Assessment (using hand held doppler) ASSESSMENTS - Ostomy and/or Continence Assessment and Care []  - 0 Incontinence Assessment and Management []  - 0 Ostomy Care Assessment and Management (repouching, etc.) PROCESS - Coordination of Care []  - 0 Simple Patient / Family Education for ongoing care []  - 0 Complex (extensive) Patient / Family Education for ongoing care []  - 0 Staff obtains Chiropractor, Records, T Results / Process Orders est []  - 0 Staff telephones HHA, Nursing Homes / Clarify orders / etc []  - 0 Routine Transfer to another Facility (non-emergent condition) []  - 0 Routine Hospital Admission (non-emergent condition) []  - 0 New Admissions / Manufacturing engineer / Ordering NPWT Apligraf, etc. , []  - 0 Emergency Hospital Admission (emergent condition) []  - 0 Simple Discharge Coordination []  - 0 Complex (extensive) Discharge Coordination PROCESS - Special Needs []  - 0 Pediatric / Minor Patient Management []  - 0 Isolation Patient Management []  - 0 Hearing / Language / Visual special needs []  - 0 Assessment of Community assistance (transportation, D/C planning, etc.) []  -  0 Additional assistance / Altered mentation []  - 0 Support Surface(s) Assessment (bed, cushion, seat, etc.) INTERVENTIONS - Wound Cleansing / Measurement []  - 0 Simple Wound Cleansing - one wound []  -  0 Complex Wound Cleansing - multiple wounds []  - 0 Wound Imaging (photographs - any number of wounds) []  - 0 Wound Tracing (instead of photographs) []  - 0 Simple Wound Measurement - one wound []  - 0 Complex Wound Measurement - multiple wounds INTERVENTIONS - Wound Dressings []  - 0 Small Wound Dressing one or multiple wounds []  - 0 Medium Wound Dressing one or multiple wounds []  - 0 Large Wound Dressing one or multiple wounds []  - 0 Application of Medications - topical []  - 0 Application of Medications - injection INTERVENTIONS - Miscellaneous []  - 0 External ear exam X- 1 5 Specimen Collection (cultures, biopsies, blood, body fluids, etc.) []  - 0 Specimen(s) / Culture(s) sent or taken to Lab for analysis []  - 0 Patient Transfer (multiple staff / Nurse, adult / Similar devices) []  - 0 Simple Staple / Suture removal (25 or less) []  - 0 Complex Staple / Suture removal (26 or more) []  - 0 Hypo / Hyperglycemic Management (close monitor of Blood Glucose) []  - 0 Ankle / Brachial Index (ABI) - do not check if billed separately X- 1 5 Vital Signs Has the patient been seen at the hospital within the last three years: Yes Total Score: 10 Level Of Care: New/Established - Level 1 Electronic Signature(s) Signed: 11/29/2021 2:58:35 PM By: [name] CHT EMT BS , , Entered By: [name] on 11/20/2021 10:56:59 -------------------------------------------------------------------------------- Vitals Details Patient Name: Date of Service: [name] 11/20/2021 10:00 A M Medical Record Number: [id number] Patient Account Number: [id number] Date of Birth/Sex: Treating RN: 1966/04/30 (56 y.o. [name] Primary Care Tavon Corriher: [name] Other  Clinician: [name] Referring Laurena Valko: Treating Anterrio Mccleery/Extender: [name], [name] in Treatment: 4 Vital Signs Time Taken: 09:46 Temperature (F): 98.5 Height (in): 69 Pulse (bpm): 102 Weight (lbs): 180 Respiratory Rate (breaths/min): 18 Body Mass Index (BMI): 26.6 Blood Pressure (mmHg): 130/83 Capillary Blood Glucose (mg/dl): 517 Reference Range: 80 - 120 mg / dl Electronic Signature(s) Signed: 11/20/2021 10:53:02 AM By: [name] CHT EMT BS , , Entered By: [name] on 11/20/2021 10:53:02

## 2021-11-21 ENCOUNTER — Telehealth (HOSPITAL_BASED_OUTPATIENT_CLINIC_OR_DEPARTMENT_OTHER): Payer: Self-pay

## 2021-11-21 ENCOUNTER — Encounter (HOSPITAL_BASED_OUTPATIENT_CLINIC_OR_DEPARTMENT_OTHER): Payer: No Typology Code available for payment source | Admitting: Internal Medicine

## 2021-11-21 DIAGNOSIS — L97514 Non-pressure chronic ulcer of other part of right foot with necrosis of bone: Secondary | ICD-10-CM | POA: Diagnosis not present

## 2021-11-21 DIAGNOSIS — E11621 Type 2 diabetes mellitus with foot ulcer: Secondary | ICD-10-CM

## 2021-11-21 DIAGNOSIS — M86071 Acute hematogenous osteomyelitis, right ankle and foot: Secondary | ICD-10-CM | POA: Diagnosis not present

## 2021-11-21 LAB — GLUCOSE, CAPILLARY
Glucose-Capillary: 358 mg/dL — ABNORMAL HIGH (ref 70–99)
Glucose-Capillary: 308 mg/dL — ABNORMAL HIGH (ref 70–99)

## 2021-11-21 NOTE — Progress Notes (Signed)
ZEEV, DEAKINS ([id number]) Visit Report for 11/21/2021 Problem List Details Patient Name: Date of Service: [name] [facility] 11/21/2021 10:00 A M Medical Record Number: [id number] Patient Account Number: [id number] Date of Birth/Sex: Treating RN: 1965/10/15 (56 y.o. 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, right ankle and foot Facility Procedures CPT4 Code: [id number] Description: G0277-(Facility Use Only) HBOT full body chamber, , ICD-10 Diagnosis Description E11.621 Type 2 diabetes mellitus  with foot ulcer L97.514 Non-pressure chronic ulcer of other part of right foot with necrosis of bone M86.071 Acute  hematogenous osteomyelitis, right ankle and foot Modifier: Quantity: 4 Physician Procedures : CPT4 Code Description Modifier [id number] - WC PHYS HYPERBARIC OXYGEN THERAPY ICD-10 Diagnosis Description E11.621 Type 2 diabetes mellitus with foot ulcer L97.514 Non-pressure chronic ulcer of other part of right foot with necrosis of bone M86.071  Acute hematogenous osteomyelitis, right ankle and foot Quantity: 1 Electronic Signature(s) Signed: 11/21/2021 3:38:27 PM By: [name] EMT Signed: 11/21/2021 4:09:00 PM By: [name] DO Entered By: [name] on 11/21/2021 15:38:26

## 2021-11-21 NOTE — Progress Notes (Signed)
[name], [name] ([id number]) Visit Report for 11/20/2021 SuperBill Details Patient Name: Date of Service: [name] [facility] 11/20/2021 Medical Record Number: [id number] Patient Account Number: [id number] Date of Birth/Sex: Treating RN: 1965-09-19 (56 y.o. 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

## 2021-11-21 NOTE — Progress Notes (Signed)
Christian, [name] ([id number]) Visit Report for 11/21/2021 Arrival Information Details Patient Name: Date of Service: [name] [facility] 11/21/2021 10:00 A M Medical Record Number: [id number] Patient Account Number: [id number] Date of Birth/Sex: Treating RN: 11/26/1965 (56 y.o. [name] Primary Care Antwion Carpenter: [name] Other Clinician: [name] Referring Na Waldrip: Treating Alysabeth Scalia/Extender: [name], [name] in Treatment: 4 Visit Information History Since Last Visit All ordered tests and consults were completed: Yes Patient Arrived: Ambulatory Added or deleted any medications: No Arrival Time: 09:37 Any new allergies or adverse reactions: No Accompanied By: None Had a fall or experienced change in No Transfer Assistance: None activities of daily living that may affect Patient Identification Verified: Yes risk of falls: Secondary Verification Process Completed: Yes Signs or symptoms of abuse/neglect since last visito No Patient Requires Transmission-Based Precautions: No Hospitalized since last visit: No Patient Has Alerts: No Implantable device outside of the clinic excluding No cellular tissue based products placed in the center since last visit: Pain Present Now: No Electronic Signature(s) Signed: 11/21/2021 3:32:54 PM By: [name] EMT Entered By: [name] on 11/21/2021 15:32:54 -------------------------------------------------------------------------------- Encounter Discharge Information Details Patient Name: Date of Service: Christian Regal RD, [name] 11/21/2021 10:00 A M Medical Record Number: [id number] Patient Account Number: [id number] Date of Birth/Sex: Treating RN: 1965-12-29 (56 y.o. [name] Primary Care Lacretia Tindall: [name] Other Clinician: [name] Referring Chancy Smigiel: Treating [place]/Extender: [name], [name] in Treatment: 4 Encounter Discharge Information  Items Discharge Condition: Stable Ambulatory Status: Ambulatory Discharge Destination: Home Transportation: Private Auto Accompanied By: None Schedule Follow-up Appointment: Yes Clinical Summary of Care: Electronic Signature(s) Signed: 11/21/2021 3:39:10 PM By: [name] EMT Entered By: [name] on 11/21/2021 15:39:10 -------------------------------------------------------------------------------- Vitals Details Patient Name: Date of Service: Christian Regal RD, [name] 11/21/2021 10:00 A M Medical Record Number: [id number] Patient Account Number: [id number] Date of Birth/Sex: Treating RN: Sep 23, 1965 (56 y.o. [name] Primary Care Yahaira Bruski: [name] Other Clinician: [name] Referring Kyasia Steuck: Treating Rickiya Picariello/Extender: [name], [name] in Treatment: 4 Vital Signs Time Taken: 09:51 Temperature (F): 98.5 Height (in): 69 Pulse (bpm): 97 Weight (lbs): 180 Respiratory Rate (breaths/min): 16 Body Mass Index (BMI): 26.6 Blood Pressure (mmHg): 125/80 Capillary Blood Glucose (mg/dl): 400 Reference Range: 80 - 120 mg / dl Electronic Signature(s) Signed: 11/21/2021 3:33:44 PM By: [name] EMT Entered By: [name] on 11/21/2021 15:33:43

## 2021-11-21 NOTE — Progress Notes (Addendum)
Christian, [name] ([id number]) Visit Report for 11/21/2021 HBO Details Patient Name: Date of Service: [name] 11/21/2021 10:00 A M Medical Record Number: [id number] Patient Account Number: [id number] Date of Birth/Sex: Treating RN: [birth year] (56 y.o. [name] Primary Care Trever Streater: [name] Other Clinician: [name] Referring Rosea Dory: Treating Sentoria Brent/Extender: [name] [facility] [name] in Treatment: 4 HBO Treatment Course Details Treatment Course Number: 1 Ordering Elenna Spratling: [name] T Treatments Ordered: otal 40 HBO Treatment Start Date: 11/06/2021 HBO Indication: Diabetic Ulcer(s) of the Lower Extremity Standard/Conservative Wound Care tried and failed greater than or equal to 30 days Wound #1 Right, Medial T Great oe HBO Treatment Details Treatment Number: 10 Patient Type: Outpatient Chamber Type: Monoplace Chamber Serial #: [id number] Treatment Protocol: 2.0 ATA with 90 minutes oxygen, with two 5 minute air breaks Treatment Details Compression Rate Down: 2.0 psi / minute De-Compression Rate Up: 2.0 psi / minute A breaks and breathing ir Compress Tx Pressure periods Decompress Decompress Begins Reached (leave unused spaces Begins Ends blank) Chamber Pressure (ATA 1 2 2 2 2 2  --2 1 ) Clock Time (24 hr) 10:14 10:23 10:53 10:58 11:28 11:34 - - 12:04 12:11 Treatment Length: 117 (minutes) Treatment Segments: 4 Vital Signs Capillary Blood Glucose Reference Range: 80 - 120 mg / dl HBO Diabetic Blood Glucose Intervention Range: <131 mg/dl or >782 mg/dl Time Vitals Blood Respiratory Capillary Blood Glucose Pulse Action Type: Pulse: Temperature: Taken: Pressure: Rate: Glucose (mg/dl): Meter #: Oximetry (%) Taken: Pre 09:51 125/80 97 16 98.5 358 Post 12:16 146/92 100 14 97.8 308 Treatment Response Treatment Toleration: Well Treatment Completion Status: Treatment Completed without Adverse Event Additional Procedure  Documentation Tissue Sevierity: Necrosis of bone Physician HBO Attestation: I certify that I supervised this HBO treatment in accordance with Medicare guidelines. A trained emergency response team is readily available per [name] policies and procedures. Continue HBOT as ordered. Yes Electronic Signature(s) Signed: 11/21/2021 4:09:00 PM By: [name] DO Previous Signature: 11/21/2021 3:38:07 PM Version By: [name] EMT Entered By: [name] on 11/21/2021 16:08:34 -------------------------------------------------------------------------------- HBO Safety Checklist Details Patient Name: Date of Service: [name], [name] 11/21/2021 10:00 A M Medical Record Number: [id number] Patient Account Number: [id number] Date of Birth/Sex: Treating RN: 1966/04/28 (56 y.o. [name] Primary Care Flavia Bruss: [name] Other Clinician: [name] Referring Yulia Ulrich: Treating Hydie Langan/Extender: [name] [name] in Treatment: 4 HBO Safety Checklist Items Safety Checklist Consent Form Signed Patient voided / foley secured and emptied When did you last eato 0940 Last dose of injectable or oral agent 0800 Ostomy pouch emptied and vented if applicable NA All implantable devices assessed, documented and approved NA Intravenous access site secured and place NA Valuables secured Linens and cotton and cotton/polyester blend (less than 51% polyester) Personal oil-based products / skin lotions / body lotions removed Wigs or hairpieces removed NA Smoking or tobacco materials removed Books / newspapers / magazines / loose paper removed Cologne, aftershave, perfume and deodorant removed Jewelry removed (may wrap wedding band) NA Make-up removed NA Hair care products removed Battery operated devices (external) removed Heating patches and chemical warmers removed Titanium eyewear removed NA Nail polish cured greater than 10  hours NA Casting material cured greater than 10 hours NA Hearing aids removed NA Loose dentures or partials removed NA Prosthetics have been removed Patient demonstrates correct use of air break device (if applicable) Patient concerns have been addressed Patient grounding bracelet on and cord attached to chamber Specifics for  Inpatients (complete in addition to above) Medication sheet sent with patient NA Intravenous medications needed or due during therapy sent with patient NA Drainage tubes (e.g. nasogastric tube or chest tube secured and vented) NA Endotracheal or Tracheotomy tube secured NA Cuff deflated of air and inflated with saline NA Airway suctioned NA Notes Paper version used prior to treatment. Electronic Signature(s) Signed: 11/21/2021 3:35:08 PM By: [name] EMT Entered By: [name] on 11/21/2021 15:35:08

## 2021-11-22 ENCOUNTER — Encounter (HOSPITAL_BASED_OUTPATIENT_CLINIC_OR_DEPARTMENT_OTHER): Payer: No Typology Code available for payment source | Admitting: General Surgery

## 2021-11-22 DIAGNOSIS — E11621 Type 2 diabetes mellitus with foot ulcer: Secondary | ICD-10-CM | POA: Diagnosis not present

## 2021-11-22 LAB — GLUCOSE, CAPILLARY
Glucose-Capillary: 348 mg/dL — ABNORMAL HIGH (ref 70–99)
Glucose-Capillary: 287 mg/dL — ABNORMAL HIGH (ref 70–99)

## 2021-11-22 NOTE — Progress Notes (Signed)
[name], [name] ([id number]) Visit Report for 11/22/2021 HBO Details Patient Name: Date of Service: [name] 11/22/2021 10:00 A M Medical Record Number: [id number] Patient Account Number: [id number] Date of Birth/Sex: Treating RN: 1966-04-09 (56 y.o. [name] Primary Care Sharetta Ricchio: [name] Other Clinician: [name] Referring Cyanna Neace: Treating Alzina Golda/Extender: [name], [name] in Treatment: 4 HBO Treatment Course Details Treatment Course Number: 1 Ordering Julias Mould: [name] T Treatments Ordered: otal 40 HBO Treatment Start Date: 11/06/2021 HBO Indication: Diabetic Ulcer(s) of the Lower Extremity Standard/Conservative Wound Care tried and failed greater than or equal to 30 days Wound #1 Right, Medial T Great oe HBO Treatment Details Treatment Number: 11 Patient Type: Outpatient Chamber Type: Monoplace Chamber Serial #: [id number] Treatment Protocol: 2.0 ATA with 90 minutes oxygen, with two 5 minute air breaks Treatment Details Compression Rate Down: 2.0 psi / minute De-Compression Rate Up: 2.0 psi / minute A breaks and breathing ir Compress Tx Pressure periods Decompress Decompress Begins Reached (leave unused spaces Begins Ends blank) Chamber Pressure (ATA 1 2 2 2 2 2  --2 1 ) Clock Time (24 hr) 10:25 10:34 11:04 11:09 11:39 11:44 - - 12:14 12:26 Treatment Length: 121 (minutes) Treatment Segments: 4 Vital Signs Capillary Blood Glucose Reference Range: 80 - 120 mg / dl HBO Diabetic Blood Glucose Intervention Range: <131 mg/dl or >308 mg/dl Time Vitals Blood Respiratory Capillary Blood Glucose Pulse Action Type: Pulse: Temperature: Taken: Pressure: Rate: Glucose (mg/dl): Meter #: Oximetry (%) Taken: Pre 10:21 138/99 99 16 98.1 348 Post 12:28 153/101 97 16 98.1 287 Treatment Response Treatment Toleration: Well Treatment Completion Status: Treatment Completed without Adverse Event Treatment Notes Dr.  Lady Gary informed of the patient per and post CBG. Additional Procedure Documentation Tissue Sevierity: Necrosis of bone Physician HBO Attestation: I certify that I supervised this HBO treatment in accordance with Medicare guidelines. A trained emergency response team is readily available per Yes hospital policies and procedures. Continue HBOT as ordered. Yes Electronic Signature(s) Signed: 11/22/2021 6:04:42 PM By: [name] MD FACS Previous Signature: 11/22/2021 5:27:22 PM Version By: [name] EMT Entered By: [name] on 11/22/2021 18:04:42 -------------------------------------------------------------------------------- HBO Safety Checklist Details Patient Name: Date of Service: [name], [name] 11/22/2021 10:00 A M Medical Record Number: [id number] Patient Account Number: [id number] Date of Birth/Sex: Treating RN: Apr 09, 1966 (56 y.o. [name] Primary Care Muneer Leider: [name] Other Clinician: [name] Referring Brookes Craine: Treating Emmelyn Schmale/Extender: [name] [name] in Treatment: 4 HBO Safety Checklist Items Safety Checklist Consent Form Signed Patient voided / foley secured and emptied When did you last eato 0930 Last dose of injectable or oral agent 0930 Ostomy pouch emptied and vented if applicable NA All implantable devices assessed, documented and approved NA Intravenous access site secured and place NA Valuables secured Linens and cotton and cotton/polyester blend (less than 51% polyester) Personal oil-based products / skin lotions / body lotions removed Wigs or hairpieces removed NA Smoking or tobacco materials removed Books / newspapers / magazines / loose paper removed Cologne, aftershave, perfume and deodorant removed Jewelry removed (may wrap wedding band) NA Make-up removed NA Hair care products removed Battery operated devices (external) removed Heating patches and chemical warmers  removed Titanium eyewear removed NA Nail polish cured greater than 10 hours NA Casting material cured greater than 10 hours NA Hearing aids removed NA Loose dentures or partials removed NA Prosthetics have been removed NA Patient demonstrates correct use of air break device (if applicable) Patient concerns  have been addressed Patient grounding bracelet on and cord attached to chamber Specifics for Inpatients (complete in addition to above) Medication sheet sent with patient NA Intravenous medications needed or due during therapy sent with patient NA Drainage tubes (e.g. nasogastric tube or chest tube secured and vented) NA Endotracheal or Tracheotomy tube secured NA Cuff deflated of air and inflated with saline NA Airway suctioned NA Notes The safety checklist was done before treatment started. Electronic Signature(s) Signed: 11/22/2021 5:24:39 PM By: [name] EMT Entered By: [name] on 11/22/2021 17:24:39

## 2021-11-22 NOTE — Progress Notes (Signed)
TYJAE, ISSA ([id number]) Visit Report for 11/22/2021 Problem List Details Patient Name: Date of Service: [name] [facility] 11/22/2021 10:00 A M Medical Record Number: [id number] Patient Account Number: [id number] Date of Birth/Sex: Treating RN: 1965/11/07 (56 y.o. 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, right ankle and foot Facility Procedures CPT4 Code: [id number] Description: G0277-(Facility Use Only) HBOT full body chamber, , ICD-10 Diagnosis Description E11.621 Type 2 diabetes  mellitus with foot ulcer L97.514 Non-pressure chronic ulcer of other part of right foot with necrosis of bone M86.071 Acute  hematogenous osteomyelitis, right ankle and foot Modifier: Quantity: 4 Physician Procedures : CPT4 Code Description Modifier [id number] - WC PHYS HYPERBARIC OXYGEN THERAPY ICD-10 Diagnosis Description E11.621 Type 2 diabetes mellitus with foot ulcer L97.514 Non-pressure chronic ulcer of other part of right foot with necrosis of bone M86.071  Acute hematogenous osteomyelitis, right ankle and foot Quantity: 1 Electronic Signature(s) Signed: 11/22/2021 5:27:44 PM By: [name] EMT Signed: 11/22/2021 6:00:25 PM By: [name] MD FACS Entered By: [name] on 11/22/2021 17:27:43

## 2021-11-22 NOTE — Progress Notes (Signed)
JIOVANNI, HEETER ([id number]) Visit Report for 11/22/2021 Arrival Information Details Patient Name: Date of Service: [name] 11/22/2021 10:00 A M Medical Record Number: [id number] Patient Account Number: [id number] Date of Birth/Sex: Treating RN: 1965/08/25 (56 y.o. [name] Primary Care Lando Alcalde: [name] Other Clinician: [name] Referring Maha Fischel: Treating Zenith Kercheval/Extender: [name] [name] in Treatment: 4 Visit Information History Since Last Visit All ordered tests and consults were completed: Yes Patient Arrived: Ambulatory Added or deleted any medications: No Arrival Time: 09:46 Any new allergies or adverse reactions: No Accompanied By: None Had a fall or experienced change in No Transfer Assistance: None activities of daily living that may affect Patient Identification Verified: Yes risk of falls: Secondary Verification Process Completed: Yes Signs or symptoms of abuse/neglect since last visito No Patient Requires Transmission-Based Precautions: No Hospitalized since last visit: No Patient Has Alerts: No Implantable device outside of the clinic excluding No cellular tissue based products placed in the center since last visit: Pain Present Now: No Electronic Signature(s) Signed: 11/22/2021 5:22:45 PM By: [name] EMT Entered By: [name] on 11/22/2021 17:22:44 -------------------------------------------------------------------------------- Encounter Discharge Information Details Patient Name: Date of Service: [name] RD, [name] 11/22/2021 10:00 A M Medical Record Number: [id number] Patient Account Number: [id number] Date of Birth/Sex: Treating RN: Jul 04, 1965 (56 y.o. [name] Primary Care [name]: [name] Other Clinician: [name] Referring [name]: Treating Andre Gallego/Extender: [name] [name] in Treatment: 4 Encounter Discharge Information  Items Discharge Condition: Stable Ambulatory Status: Ambulatory Discharge Destination: Home Transportation: Private Auto Accompanied By: None Schedule Follow-up Appointment: Yes Clinical Summary of Care: Electronic Signature(s) Signed: 11/22/2021 5:28:38 PM By: [name] EMT Entered By: [name] on 11/22/2021 17:28:38 -------------------------------------------------------------------------------- Vitals Details Patient Name: Date of Service: [name] RD, [name] 11/22/2021 10:00 A M Medical Record Number: [id number] Patient Account Number: [id number] Date of Birth/Sex: Treating RN: 10-22-1965 (56 y.o. [name] Primary Care Floraine Buechler: [name] Other Clinician: [name] Referring [name]: Treating Newton Frutiger/Extender: [name] [name] in Treatment: 4 Vital Signs Time Taken: 10:21 Temperature (F): 98.1 Height (in): 69 Pulse (bpm): 99 Weight (lbs): 180 Respiratory Rate (breaths/min): 16 Body Mass Index (BMI): 26.6 Blood Pressure (mmHg): 138/99 Capillary Blood Glucose (mg/dl): 923 Reference Range: 80 - 120 mg / dl Electronic Signature(s) Signed: 11/22/2021 5:23:37 PM By: [name] EMT Entered By: [name] on 11/22/2021 17:23:37

## 2021-11-23 ENCOUNTER — Encounter (HOSPITAL_BASED_OUTPATIENT_CLINIC_OR_DEPARTMENT_OTHER): Payer: No Typology Code available for payment source | Admitting: Internal Medicine

## 2021-11-23 ENCOUNTER — Encounter (HOSPITAL_BASED_OUTPATIENT_CLINIC_OR_DEPARTMENT_OTHER): Payer: No Typology Code available for payment source | Admitting: General Surgery

## 2021-11-23 DIAGNOSIS — M86071 Acute hematogenous osteomyelitis, right ankle and foot: Secondary | ICD-10-CM

## 2021-11-23 DIAGNOSIS — L97514 Non-pressure chronic ulcer of other part of right foot with necrosis of bone: Secondary | ICD-10-CM | POA: Diagnosis not present

## 2021-11-23 DIAGNOSIS — E11621 Type 2 diabetes mellitus with foot ulcer: Secondary | ICD-10-CM | POA: Diagnosis not present

## 2021-11-23 LAB — GLUCOSE, CAPILLARY
Glucose-Capillary: 349 mg/dL — ABNORMAL HIGH (ref 70–99)
Glucose-Capillary: 298 mg/dL — ABNORMAL HIGH (ref 70–99)

## 2021-11-23 NOTE — Progress Notes (Signed)
JERIMEY, BURRIDGE ([id number]) Visit Report for 11/23/2021 Arrival Information Details Patient Name: Date of Service: [name] [facility] 11/23/2021 8:45 A M Medical Record Number: [id number] Patient Account Number: [id number] Date of Birth/Sex: Treating RN: 1966-02-03 (56 y.o. [name] Primary Care Zoelle Markus: [name] Other Clinician: Referring Florentina Marquart: Treating Chanette Demo/Extender: [name] in Treatment: 5 Visit Information History Since Last Visit Added or deleted any medications: No Patient Arrived: Ambulatory Any new allergies or adverse reactions: No Arrival Time: 09:10 Had a fall or experienced change in No Transfer Assistance: None activities of daily living that may affect Patient Identification Verified: Yes risk of falls: Secondary Verification Process Completed: Yes Signs or symptoms of abuse/neglect since last visito No Patient Requires Transmission-Based Precautions: No Hospitalized since last visit: No Patient Has Alerts: No Implantable device outside of the clinic excluding No cellular tissue based products placed in the center since last visit: Has Dressing in Place as Prescribed: Yes Pain Present Now: No Electronic Signature(s) Signed: 11/23/2021 4:54:47 PM By: Lorrin Jackson Entered By: Lorrin Jackson on 11/23/2021 09:12:00 -------------------------------------------------------------------------------- Encounter Discharge Information Details Patient Name: Date of Service: [name] RD, [place] [facility] 11/23/2021 8:45 A M Medical Record Number: [id number] Patient Account Number: [id number] Date of Birth/Sex: Treating RN: 1965/09/03 (56 y.o. [name] Primary Care Leontyne Manville: [name] Other Clinician: Referring Linden Mikes: Treating Asyah Candler/Extender: [name] in Treatment: 5 Encounter Discharge Information Items Post Procedure Vitals Discharge Condition: Stable Temperature  (F): 98.2 Ambulatory Status: Ambulatory Pulse (bpm): 100 Discharge Destination: Home Respiratory Rate (breaths/min): 16 Transportation: Private Auto Blood Pressure (mmHg): 169/102 Schedule Follow-up Appointment: Yes Clinical Summary of Care: Provided on 11/23/2021 Form Type Recipient Paper Patient Patient Electronic Signature(s) Signed: 11/23/2021 4:54:47 PM By: Lorrin Jackson Entered By: Lorrin Jackson on 11/23/2021 10:01:34 -------------------------------------------------------------------------------- Lower Extremity Assessment Details Patient Name: Date of Service: [name] 11/23/2021 8:45 A M Medical Record Number: [id number] Patient Account Number: [id number] Date of Birth/Sex: Treating RN: 1965/12/05 (56 y.o. [name] Primary Care Esmeralda Blanford: [name] Other Clinician: Referring Kadejah Sandiford: Treating Amiel Mccaffrey/Extender: [name] in Treatment: 5 Edema Assessment Assessed: [Left: No] [Right: Yes] Edema: [Left: Ye] [Right: s] Calf Left: Right: Point of Measurement: 37 cm From Medial Instep 38 cm Ankle Left: Right: Point of Measurement: 9 cm From Medial Instep 26 cm Vascular Assessment Pulses: Dorsalis Pedis Palpable: [Right:Yes] Electronic Signature(s) Signed: 11/23/2021 4:54:47 PM By: Lorrin Jackson Entered By: Lorrin Jackson on 11/23/2021 09:16:15 -------------------------------------------------------------------------------- Multi Wound Chart Details Patient Name: Date of Service: [name] RD, [name] [facility] 11/23/2021 8:45 A M Medical Record Number: [id number] Patient Account Number: [id number] Date of Birth/Sex: Treating RN: [birth year] (56 y.o. [name] Primary Care Pantelis Elgersma: [name] Other Clinician: Referring Aasia Peavler: Treating Natsha Guidry/Extender: [name] in Treatment: 5 Vital Signs Height(in): 69 Pulse(bpm): 100 Weight(lbs): 180 Blood  Pressure(mmHg): 169/102 Body Mass Index(BMI): 26.6 Temperature(F): 98.2 Respiratory Rate(breaths/min): 16 Photos: [1:Right, Medial T Great oe] [N/A:N/A N/A] Wound Location: [1:Gradually Appeared] [N/A:N/A] Wounding Event: [1:Diabetic Wound/Ulcer of the Lower] [N/A:N/A] Primary Etiology: [1:Extremity Hypertension, Type II Diabetes,] [N/A:N/A] Comorbid History: [1:Osteomyelitis, Neuropathy 10/10/2021] [N/A:N/A] Date Acquired: [1:5] [N/A:N/A] Weeks of Treatment: [1:Open] [N/A:N/A] Wound Status: [1:No] [N/A:N/A] Wound Recurrence: [1:1x0.8x0.3] [N/A:N/A] Measurements L x W x D (cm) [1:0.628] [N/A:N/A] A (cm) : rea [1:0.188] [N/A:N/A] Volume (cm) : [1:86.70%] [N/A:N/A] % Reduction in A [1:rea: 95.00%] [N/A:N/A] % Reduction in Volume: [1:Grade 3] [N/A:N/A] Classification: [1:Medium] [N/A:N/A] Exudate A mount: [1:Serosanguineous] [N/A:N/A]  Patient Account Number: [id number] Date of Birth/Gender: Treating  RN: [birth year] (56 y.o. [name] Primary Care Physician: [name] Other Clinician: Referring Physician: Treating Physician/Extender: [facility] [name] in Treatment: 5 Education Assessment Education Provided To: Patient Education Topics Provided Smoking and Wound Healing: Methods: Explain/Verbal, Printed Responses: State content correctly Wound/Skin Impairment: Methods: Explain/Verbal, Printed Responses: State content correctly Electronic Signature(s) Signed: 11/23/2021 4:54:47 PM By: Lorrin Jackson Entered By: Lorrin Jackson on 11/23/2021 09:18:17 -------------------------------------------------------------------------------- Wound Assessment Details Patient Name: Date of Service: [name] RD, [name] [facility] 11/23/2021 8:45 A M Medical Record Number: [id number] Patient Account Number: [id number] Date of Birth/Sex: Treating RN: 04-03-1966 (56 y.o. [name] Primary Care Chaitra Mast: [name] Other Clinician: Referring Yohance Hathorne: Treating Solei Wubben/Extender: [facility] [name] in Treatment: 5 Wound Status Wound Number: 1 Primary Etiology: Diabetic Wound/Ulcer of the Lower Extremity Wound Location: Right, Medial T Great oe Wound Status: Open Wounding Event: Gradually Appeared Comorbid Hypertension, Type II Diabetes, Osteomyelitis, Neuropathy History: Date Acquired: 10/10/2021 Weeks Of Treatment: 5 Clustered Wound: No Photos Wound Measurements Length: (cm) 1 Width: (cm) 0.8 Depth: (cm) 0.3 Area: (cm) 0.628 Volume: (cm) 0.188 % Reduction in Area: 86.7% % Reduction in Volume: 95% Epithelialization: Medium (34-66%) Tunneling: No Undermining: No Wound Description Classification: Grade 3 Wound Margin: Distinct, outline attached Exudate Amount: Medium Exudate Type: Serosanguineous Exudate Color: red, brown Foul Odor After Cleansing: No Slough/Fibrino Yes Wound Bed Granulation Amount:  Large (67-100%) Exposed Structure Granulation Quality: Red, Pink Fascia Exposed: No Necrotic Amount: Small (1-33%) Fat Layer (Subcutaneous Tissue) Exposed: Yes Necrotic Quality: Adherent Slough Tendon Exposed: No Muscle Exposed: No Joint Exposed: No Bone Exposed: No Assessment Notes maceration noted, callous Treatment Notes Wound #1 (Toe Great) Wound Laterality: Right, Medial Cleanser Soap and Water Discharge Instruction: May shower and wash wound with dial antibacterial soap and water prior to dressing change. Wound Cleanser Discharge Instruction: Cleanse the wound with wound cleanser prior to applying a clean dressing using gauze sponges, not tissue or cotton balls. Peri-Wound Care Topical Gentamicin Discharge Instruction: As directed by physician Primary Dressing Secondary Dressing Woven Gauze Sponges 2x2 in Discharge Instruction: Apply over primary dressing as directed. Secured With Conforming Stretch Gauze Bandage, Sterile 2x75 (in/in) Discharge Instruction: Secure with stretch gauze as directed. 75M Medipore H Soft Cloth Surgical T ape, 4 x 10 (in/yd) Discharge Instruction: Secure with tape as directed. Compression Wrap Compression Stockings Add-Ons Electronic Signature(s) Signed: 11/23/2021 4:54:47 PM By: Lorrin Jackson Entered By: Lorrin Jackson on 11/23/2021 09:21:18 -------------------------------------------------------------------------------- Vitals Details Patient Name: Date of Service: [name] RD, [place] [facility] 11/23/2021 8:45 A M Medical Record Number: [id number] Patient Account Number: [id number] Date of Birth/Sex: Treating RN: 12-13-1965 (56 y.o. [name] Primary Care Vergia Chea: [name] Other Clinician: Referring Rosely Fernandez: Treating Shantavia Jha/Extender: [facility] [name] in Treatment: 5 Vital Signs Time Taken: 09:12 Temperature (F): 98.2 Height (in): 69 Pulse (bpm): 100 Weight (lbs): 180 Respiratory Rate  (breaths/min): 16 Body Mass Index (BMI): 26.6 Blood Pressure (mmHg): 169/102 Reference Range: 80 - 120 mg / dl Electronic Signature(s) Signed: 11/23/2021 4:54:47 PM By: Lorrin Jackson Entered By: Lorrin Jackson on 11/23/2021 09:13:44  Patient Account Number: [id number] Date of Birth/Gender: Treating  RN: [birth year] (56 y.o. [name] Primary Care Physician: [name] Other Clinician: Referring Physician: Treating Physician/Extender: [facility] [name] in Treatment: 5 Education Assessment Education Provided To: Patient Education Topics Provided Smoking and Wound Healing: Methods: Explain/Verbal, Printed Responses: State content correctly Wound/Skin Impairment: Methods: Explain/Verbal, Printed Responses: State content correctly Electronic Signature(s) Signed: 11/23/2021 4:54:47 PM By: Lorrin Jackson Entered By: Lorrin Jackson on 11/23/2021 09:18:17 -------------------------------------------------------------------------------- Wound Assessment Details Patient Name: Date of Service: [name] RD, [name] [facility] 11/23/2021 8:45 A M Medical Record Number: [id number] Patient Account Number: [id number] Date of Birth/Sex: Treating RN: 04-03-1966 (56 y.o. [name] Primary Care Chaitra Mast: [name] Other Clinician: Referring Yohance Hathorne: Treating Solei Wubben/Extender: [facility] [name] in Treatment: 5 Wound Status Wound Number: 1 Primary Etiology: Diabetic Wound/Ulcer of the Lower Extremity Wound Location: Right, Medial T Great oe Wound Status: Open Wounding Event: Gradually Appeared Comorbid Hypertension, Type II Diabetes, Osteomyelitis, Neuropathy History: Date Acquired: 10/10/2021 Weeks Of Treatment: 5 Clustered Wound: No Photos Wound Measurements Length: (cm) 1 Width: (cm) 0.8 Depth: (cm) 0.3 Area: (cm) 0.628 Volume: (cm) 0.188 % Reduction in Area: 86.7% % Reduction in Volume: 95% Epithelialization: Medium (34-66%) Tunneling: No Undermining: No Wound Description Classification: Grade 3 Wound Margin: Distinct, outline attached Exudate Amount: Medium Exudate Type: Serosanguineous Exudate Color: red, brown Foul Odor After Cleansing: No Slough/Fibrino Yes Wound Bed Granulation Amount:  Large (67-100%) Exposed Structure Granulation Quality: Red, Pink Fascia Exposed: No Necrotic Amount: Small (1-33%) Fat Layer (Subcutaneous Tissue) Exposed: Yes Necrotic Quality: Adherent Slough Tendon Exposed: No Muscle Exposed: No Joint Exposed: No Bone Exposed: No Assessment Notes maceration noted, callous Treatment Notes Wound #1 (Toe Great) Wound Laterality: Right, Medial Cleanser Soap and Water Discharge Instruction: May shower and wash wound with dial antibacterial soap and water prior to dressing change. Wound Cleanser Discharge Instruction: Cleanse the wound with wound cleanser prior to applying a clean dressing using gauze sponges, not tissue or cotton balls. Peri-Wound Care Topical Gentamicin Discharge Instruction: As directed by physician Primary Dressing Secondary Dressing Woven Gauze Sponges 2x2 in Discharge Instruction: Apply over primary dressing as directed. Secured With Conforming Stretch Gauze Bandage, Sterile 2x75 (in/in) Discharge Instruction: Secure with stretch gauze as directed. 75M Medipore H Soft Cloth Surgical T ape, 4 x 10 (in/yd) Discharge Instruction: Secure with tape as directed. Compression Wrap Compression Stockings Add-Ons Electronic Signature(s) Signed: 11/23/2021 4:54:47 PM By: Lorrin Jackson Entered By: Lorrin Jackson on 11/23/2021 09:21:18 -------------------------------------------------------------------------------- Vitals Details Patient Name: Date of Service: [name] RD, [place] [facility] 11/23/2021 8:45 A M Medical Record Number: [id number] Patient Account Number: [id number] Date of Birth/Sex: Treating RN: 12-13-1965 (56 y.o. [name] Primary Care Vergia Chea: [name] Other Clinician: Referring Rosely Fernandez: Treating Shantavia Jha/Extender: [facility] [name] in Treatment: 5 Vital Signs Time Taken: 09:12 Temperature (F): 98.2 Height (in): 69 Pulse (bpm): 100 Weight (lbs): 180 Respiratory Rate  (breaths/min): 16 Body Mass Index (BMI): 26.6 Blood Pressure (mmHg): 169/102 Reference Range: 80 - 120 mg / dl Electronic Signature(s) Signed: 11/23/2021 4:54:47 PM By: Lorrin Jackson Entered By: Lorrin Jackson on 11/23/2021 09:13:44

## 2021-11-23 NOTE — Progress Notes (Addendum)
[name], [name] ([id number]) Visit Report for 11/23/2021 HBO Details Patient Name: Date of Service: [name] [facility] 11/23/2021 10:00 A M Medical Record Number: [id number] Patient Account Number: [id number] Date of Birth/Sex: Treating RN: 02-09-1966 (56 y.o. [name] Primary Care [name]: Clinic, [name] Other Clinician: Karl [name] Referring Revere Maahs: Treating [name]/Extender: [name] [facility], [name] in Treatment: 5 HBO Treatment Course Details Treatment Course Number: 1 Ordering [name]: [name] T Treatments Ordered: otal 40 HBO Treatment Start Date: 11/06/2021 HBO Indication: Diabetic Ulcer(s) of the Lower Extremity Standard/Conservative Wound Care tried and failed greater than or equal to 30 days Wound #1 Right, Medial T Great oe HBO Treatment Details Treatment Number: 12 Patient Type: Outpatient Chamber Type: Monoplace Chamber Serial #: [id number] Treatment Protocol: 2.0 ATA with 90 minutes oxygen, with two 5 minute air breaks Treatment Details A breaks and breathing ir Compress Tx Pressure periods Decompress Decompress Begins Reached (leave unused spaces Begins Ends blank) Chamber Pressure (ATA 1 2 2 2 2 2  --2 1 ) Clock Time (24 hr) 10:32 10:40 11:10 11:15 11:45 11:50 - - 12:20 12:29 Treatment Length: 117 (minutes) Treatment Segments: 4 Vital Signs Capillary Blood Glucose Reference Range: 80 - 120 mg / dl HBO Diabetic Blood Glucose Intervention Range: <131 mg/dl or >284 mg/dl Time Vitals Blood Respiratory Capillary Blood Glucose Pulse Action Type: Pulse: Temperature: Taken: Pressure: Rate: Glucose (mg/dl): Meter #: Oximetry (%) Taken: Pre 09:12 169/102 100 16 98.2 349 Post 12:35 156/99 102 16 97.8 298 Treatment Response Treatment Toleration: Well Treatment Completion Status: Treatment Completed without Adverse Event Additional Procedure Documentation Tissue Sevierity: Necrosis of bone Physician HBO Attestation: I  certify that I supervised this HBO treatment in accordance with Medicare guidelines. A trained emergency response team is readily available per Yes hospital policies and procedures. Continue HBOT as ordered. Yes Electronic Signature(s) Signed: 11/23/2021 2:40:18 PM By: Christian Guess MD FACS Previous Signature: 11/23/2021 2:24:11 PM Version By: [name] EMT Entered By: [name] on 11/23/2021 14:40:17 -------------------------------------------------------------------------------- HBO Safety Checklist Details Patient Name: Date of Service: [name], [name] 11/23/2021 10:00 A M Medical Record Number: [id number] Patient Account Number: [id number] Date of Birth/Sex: Treating RN: 06/13/1966 (56 y.o. [name] Primary Care [name]: Clinic, [name] Other Clinician: Karl [name] Referring [name]: Treating [name]/Extender: [name] [facility], [name] in Treatment: 5 HBO Safety Checklist Items Safety Checklist Consent Form Signed Patient voided / foley secured and emptied When did you last eato 0700 Last dose of injectable or oral agent 0659 Ostomy pouch emptied and vented if applicable NA All implantable devices assessed, documented and approved NA Intravenous access site secured and place NA Valuables secured Linens and cotton and cotton/polyester blend (less than 51% polyester) Personal oil-based products / skin lotions / body lotions removed Wigs or hairpieces removed NA Smoking or tobacco materials removed Books / newspapers / magazines / loose paper removed Cologne, aftershave, perfume and deodorant removed Jewelry removed (may wrap wedding band) NA Make-up removed NA Hair care products removed Battery operated devices (external) removed Heating patches and chemical warmers removed Titanium eyewear removed NA Nail polish cured greater than 10 hours NA Casting material cured greater than 10 hours NA Hearing aids  removed NA Loose dentures or partials removed NA Prosthetics have been removed NA Patient demonstrates correct use of air break device (if applicable) Patient concerns have been addressed Patient grounding bracelet on and cord attached to chamber Specifics for Inpatients (complete in addition to above) Medication sheet sent with patient NA  Intravenous medications needed or due during therapy sent with patient NA Drainage tubes (e.g. nasogastric tube or chest tube secured and vented) NA Endotracheal or Tracheotomy tube secured NA Cuff deflated of air and inflated with saline NA Airway suctioned NA Notes The safety checklist was done before treatment started. Electronic Signature(s) Signed: 11/23/2021 2:20:23 PM By: [name] EMT Entered By: [name] on 11/23/2021 14:20:23

## 2021-11-23 NOTE — Progress Notes (Signed)
[name], [name] ([id number]) Visit Report for 11/23/2021 Chief Complaint Document Details Patient Name: Date of Service: [name] 11/23/2021 8:45 A M Medical Record Number: [id number] Patient Account Number: [id number] Date of Birth/Sex: Treating RN: 05-Oct-1965 (56 y.o. [name] Primary Care Provider: [name] Other Clinician: Referring Provider: Treating Provider/Extender: [name] in Treatment: 5 Information Obtained from: Patient Chief Complaint 10/19/2021; Right great toe wound Electronic Signature(s) Signed: 11/23/2021 12:44:27 PM By: Christian Shan DO Entered By: [name] on 11/23/2021 12:38:25 -------------------------------------------------------------------------------- Debridement Details Patient Name: Date of Service: [name] RD, [place] [facility] 11/23/2021 8:45 A M Medical Record Number: [id number] Patient Account Number: [id number] Date of Birth/Sex: Treating RN: [birth year] (56 y.o. [name] Primary Care Provider: [name] Other Clinician: Referring Provider: Treating Provider/Extender: [name] in Treatment: 5 Debridement Performed for Assessment: Wound #1 Right,Medial T Great oe Performed By: Physician Christian Shan, DO Debridement Type: Debridement Severity of Tissue Pre Debridement: Fat layer exposed Level of Consciousness (Pre-procedure): Awake and Alert Pre-procedure Verification/Time Out Yes - 09:49 Taken: Start Time: 09:50 Pain Control: Other : Benzocaine T Area Debrided (L x W): otal 1 (cm) x 0.8 (cm) = 0.8 (cm) Tissue and other material debrided: Non-Viable, Callus, Slough, Subcutaneous, Slough Level: Skin/Subcutaneous Tissue Debridement Description: Excisional Instrument: Curette Bleeding: Minimum Hemostasis Achieved: Pressure End Time: 09:55 Response to Treatment: Procedure was tolerated well Level of Consciousness (Post- Awake  and Alert procedure): Post Debridement Measurements of Total Wound Length: (cm) 1 Width: (cm) 0.8 Depth: (cm) 0.3 Volume: (cm) 0.188 Character of Wound/Ulcer Post Debridement: Stable Severity of Tissue Post Debridement: Fat layer exposed Post Procedure Diagnosis Same as Pre-procedure Electronic Signature(s) Signed: 11/23/2021 12:44:27 PM By: Christian Shan DO Signed: 11/23/2021 4:54:47 PM By: [name] Entered By: [name] on 11/23/2021 09:59:03 -------------------------------------------------------------------------------- HPI Details Patient Name: Date of Service: [name] RD, [place] [facility] 11/23/2021 8:45 A M Medical Record Number: [id number] Patient Account Number: [id number] Date of Birth/Sex: Treating RN: 1966-01-23 (56 y.o. 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 He had an MRI of the right foot on 10/16/2021 that showed signal changes in the first proximal and distal phalanx consistent with acute osteomyelitis. He states that he developed a wound spontaneously to this area 10 days ago. He does have significant peripheral neuropathy and does not have sensation to the wound area or bottom of his feet. He reports being on 3 different antibiotics including clindamycin and doxycycline in the past couple of weeks. He is currently on Augmentin based on wound cultures done in the ED. He has currently been keeping the area covered. He currently denies systemic signs of infection. 4/25; patient presents for follow-up. HBO orders have been placed and we are  [facility]aiting [facility]. He obtained his chest x-ray with no cardiopulmonary abnormalities. He has been using Dakin's wet-to-dry dressings to the toe wound. He currently denies systemic signs of infection. He is currently taking Augmentin. He has an infectious disease appointment on 5/2. He has not heard back about his ABIs with TBI's. 5/2; patient presents for follow-up. He has been using Dakin's wet-to-dry dressings without issues. He  continues to take Augmentin daily. He saw Dr. Drucilla [name], infectious disease today. At this time Augmentin is being continued. He has not heard to schedule his ABIs with TBI's. He currently denies signs of infection. He has been approved for HBO treatment and will start in 1 week. 5/11; patient presents for follow-up. He has been using Dakin's wet-to-dry dressings without issues. He reports doing well with HBO therapy. Today he cannot make it to his treatment however will be back Friday. He has been consistent in his care. He currently denies signs of infection. 5/18; patient presents for follow-up. He has no issues or complaints today. He has not reached out to establish care with an endocrinologist. He does not want to establish care with an endocrinologist through his [place]. He states he will let me know which providers are covered under his secondary insurance so we can place a new referral. He has been using Dakin's wet-to-dry dressings to the right great toe wound. He has been completing HBO therapy without issues. He currently denies signs of infection. 5/25; patient presents for follow-up. He has no issues or complaints today. He has been using Dakin's wet-to-dry dressings to the right great toe wound. There is more maceration today. He has no complaints about HBO therapy. He denies systemic signs of infection. He continues antibiotics per ID. He has not scheduled his ABIs with TBI's yet. Electronic Signature(s) Signed: 11/23/2021 12:44:27 PM By: Christian Shan  DO Entered By: [name] on 11/23/2021 12:39:36 -------------------------------------------------------------------------------- Physical Exam Details Patient Name: Date of Service: [name] RD, [place] [facility] 11/23/2021 8:45 A M Medical Record Number: [id number] Patient Account Number: [id number] Date of Birth/Sex: Treating RN: Oct 01, 1965 (56 y.o. [name] Primary Care Provider: [name] Other Clinician: Referring Provider: Treating Provider/Extender: [name] in Treatment: 5 Constitutional respirations regular, non-labored and within target range for patient.. Cardiovascular 2+ dorsalis pedis/posterior tibialis pulses. Psychiatric pleasant and cooperative. Notes Right foot: T the right great toe there is an open wound with granulation tissue and nonviable tissue. The surrounding periwound is macerated. Very minimal o undermining to the 9 o'clock position. No surrounding signs of infection. Electronic Signature(s) Signed: 11/23/2021 12:44:27 PM By: Christian Shan DO Entered By: [name] on 11/23/2021 12:41:11 -------------------------------------------------------------------------------- Physician Orders Details Patient Name: Date of Service: [name] RD, [place] [facility] 11/23/2021 8:45 A M Medical Record Number: [id number] Patient Account Number: [id number] Date of Birth/Sex: Treating RN: 11-Mar-1966 (56 y.o. [name] Primary Care Provider: [name] Other Clinician: Referring Provider: Treating Provider/Extender: [name] in Treatment: 5 Verbal / Phone Orders: No Diagnosis Coding ICD-10 Coding Code Description E11.621 Type 2 diabetes mellitus with foot ulcer L97.514 Non-pressure chronic ulcer of other part of right foot with necrosis of bone M86.071 Acute hematogenous osteomyelitis, right ankle and foot Follow-up Appointments ppointment in 1 week. - w/ Dr. Heber Duryea and  Leveda Anna, RN Room # 7 6/1, 6/8 and 6/15 @ 9:30am. Return A **Prescription for Gentamicin Cream sent to pharmacy** Other: - Speak with insurance to find an Endocrinologist. Let us know if you need a referral from [place]. Patient states approved for Vascular and will be getting appointment. Bathing/ Shower/ Hygiene May shower with protection but do not get wound dressing(s) wet. Edema Control - Lymphedema / SCD / Other Elevate legs to the level of the heart or above for 30 minutes daily and/or when sitting, a frequency of: Avoid standing for long periods of time. Moisturize legs daily. Off-Loading Other: - Keep  pressure off of right foot Hyperbaric Oxygen Therapy Indication: - Wagner Grade 3/ + osteomyelitis Right Great Toe If appropriate for treatment, begin HBOT per protocol: 2.0 ATA for 90 Minutes with 2 Five (5) Minute A Breaks ir Total Number of Treatments: - 40 One treatments per day (delivered Monday through Friday unless otherwise specified in Special Instructions below): Finger stick Blood Glucose Pre- and Post- HBOT Treatment. Follow Hyperbaric Oxygen Glycemia Protocol A frin (Oxymetazoline HCL) 0.05% nasal spray - 1 spray in both nostrils daily as needed prior to HBO treatment for difficulty clearing ears Wound Treatment Wound #1 - T Great oe Wound Laterality: Right, Medial Cleanser: Soap and Water 1 x Per Day/30 Days Discharge Instructions: May shower and wash wound with dial antibacterial soap and water prior to dressing change. Cleanser: Wound Cleanser (Generic) 1 x Per Day/30 Days Discharge Instructions: Cleanse the wound with wound cleanser prior to applying a clean dressing using gauze sponges, not tissue or cotton balls. Topical: Gentamicin 1 x Per Day/30 Days Discharge Instructions: As directed by physician Secondary Dressing: Woven Gauze Sponges 2x2 in 1 x Per Day/30 Days Discharge Instructions: Apply over primary dressing as directed. Secured With: International aid/development worker, Sterile 2x75 (in/in) (Generic) 1 x Per Day/30 Days Discharge Instructions: Secure with stretch gauze as directed. Secured With: 75M Medipore H Soft Cloth Surgical T ape, 4 x 10 (in/yd) (Generic) 1 x Per Day/30 Days Discharge Instructions: Secure with tape as directed. Consults Endocrinology - Referral to Dr. Hilliard Clark Ellison-Endocrinology for diabetes management. Patient has chronic toe wound and is undergoing HBO. - (ICD10 E11.621 - Type 2 diabetes mellitus with foot ulcer) Patient Medications llergies: lisinopril A Notifications Medication Indication Start End 11/23/2021 gentamicin DOSE 1 - topical 0.1 % cream - apply daily to the wound bed [place] Obtain pre-HBO capillary blood glucose (ensure 1 physician order is in chart). A. Notify HBO physician and await physician orders. 2 If result is 70 mg/dl or below: B. If the result meets the hospital definition of a critical result, follow hospital policy. A. Give patient an 8 ounce Glucerna Shake, an 8 ounce Ensure, or 8 ounces of a Glucerna/Ensure equivalent dietary supplement*. B. Wait 30 minutes. If result is 71 mg/dl to 130 mg/dl: C. Retest patients capillary blood glucose (CBG). D. If result greater than or equal to 110 mg/dl, proceed with HBO. If result less than 110 mg/dl, notify HBO physician and consider holding HBO. If result is 131 mg/dl to 249 mg/dl: A. Proceed with HBO. A. Notify HBO physician and await physician orders. B. It is recommended to hold HBO and do If result is 250 mg/dl or greater: blood/urine ketone testing. C. If the result meets the hospital definition of a critical result, follow hospital policy. POST-HBO GLYCEMIA INTERVENTIONS ACTION INTERVENTION Obtain post HBO capillary blood glucose (ensure 1 physician order is in chart). A. Notify HBO physician and await physician orders. 2 If result is 70 mg/dl or  below: B. If the result meets the hospital definition of a critical result, follow hospital policy. A. Give patient an 8 ounce Glucerna Shake, an 8 ounce Ensure, or 8 ounces of a Glucerna/Ensure equivalent dietary supplement*. B. Wait 15 minutes for symptoms of If result is 71 mg/dl to 100 mg/dl: hypoglycemia (i.e. nervousness, anxiety, sweating, chills, clamminess, irritability, confusion, tachycardia or dizziness). C. If patient asymptomatic, discharge patient. If patient symptomatic, repeat capillary blood glucose (CBG) and notify HBO physician. If result is 101 mg/dl  to 249 mg/dl: A. Discharge patient. A. Notify HBO physician and await physician orders. B. It is recommended to do blood/urine ketone If result is 250 mg/dl or greater: testing. C. If the result meets the hospital definition of a critical result, follow hospital policy. *Juice or candies are NOT equivalent products. If patient refuses the Glucerna or Ensure, please consult the hospital dietitian for an appropriate substitute. Electronic Signature(s) Signed: 11/23/2021 12:44:27 PM By: Christian Shan DO Previous Signature: 11/23/2021 10:06:29 AM Version By: Christian Shan DO Entered By: [name] on 11/23/2021 12:41:25 Prescription 11/23/2021 -------------------------------------------------------------------------------- Sharyl Nimrod, Janett Billow DO Patient Name: Provider: [birth year] [id number] Date of Birth: NPI#: Jerilynn Mages [id number] Sex: DEA #: [phone] Phone #: License #: [place] Patient Address: [address] [place] [address] , [place], [place] [address] [phone] Allergies lisinopril Provider's Orders Endocrinology - ICD10: E11.621 - Referral to Dr. Hilliard Clark Ellison-Endocrinology for diabetes management. Patient has chronic toe wound and is undergoing HBO. Hand Signature: Date(s): Electronic  Signature(s) Signed: 11/23/2021 12:44:27 PM By: Christian Shan DO Entered By: [name] on 11/23/2021 12:41:25 -------------------------------------------------------------------------------- Problem List Details Patient Name: Date of Service: [name] RD, [place] [facility] 11/23/2021 8:45 A M Medical Record Number: [id number] Patient Account Number: [id number] Date of Birth/Sex: Treating RN: [birth year] (56 y.o. [name] Primary Care Provider: [name] Other Clinician: Referring Provider: Treating Provider/Extender: [name], [name] in Treatment: 5 Active Problems ICD-10 Encounter Code Description Active Date MDM Diagnosis E11.621 Type 2 diabetes mellitus with foot ulcer 10/19/2021 No Yes L97.514 Non-pressure chronic ulcer of other part of right foot with necrosis of bone 10/19/2021 No Yes M86.071 Acute hematogenous osteomyelitis, right ankle and foot 10/19/2021 No Yes Inactive Problems Resolved Problems Electronic Signature(s) Signed: 11/23/2021 12:44:27 PM By: Christian Shan DO Entered By: [name] on 11/23/2021 12:38:12 -------------------------------------------------------------------------------- Progress Note Details Patient Name: Date of Service: [name] RD, [place] [facility] 11/23/2021 8:45 A M Medical Record Number: [id number] Patient Account Number: [id number] Date of Birth/Sex: Treating RN: 04-03-1966 (56 y.o. 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 He had an MRI of the right foot on 10/16/2021 that showed signal changes in the first proximal and distal phalanx consistent with acute osteomyelitis. He states that he developed a wound spontaneously to this area 10 days ago. He does have significant peripheral neuropathy and does not have sensation to the wound area or bottom of his feet. He reports being on 3 different antibiotics including clindamycin and doxycycline in the past couple of weeks. He is currently on Augmentin based on wound cultures done in the ED. He has currently been keeping the area covered. He currently denies systemic signs of infection. 4/25; patient presents for follow-up. HBO orders have been placed and we are awaiting [facility] approval. He obtained his chest x-ray with no cardiopulmonary abnormalities. He has been using Dakin's wet-to-dry dressings to the toe wound. He currently denies systemic signs of infection. He is currently taking Augmentin. He has an infectious disease appointment on 5/2. He has  not heard back about his ABIs with TBI's. 5/2; patient presents for follow-up. He has been using Dakin's wet-to-dry dressings without issues. He continues to take Augmentin daily. He saw Dr. Drucilla [name], infectious disease today. At this time Augmentin is being continued. He has not heard to schedule his ABIs with TBI's. He currently denies signs of infection. He has been approved for HBO treatment and will start in 1 week. 5/11; patient presents for follow-up. He has been using Dakin's wet-to-dry dressings without issues. He reports doing well with HBO therapy. Today he cannot make it to his treatment however will be back Friday. He has been consistent in his care. He currently denies signs of infection. 5/18; patient presents for follow-up. He has no issues or complaints today. He has not reached out to establish care with an endocrinologist. He does not want to establish care with an  endocrinologist through his [place]. He states he will let me know which providers are covered under his secondary insurance so we can place a new referral. He has been using Dakin's wet-to-dry dressings to the right great toe wound. He has been completing HBO therapy without issues. He currently denies signs of infection. 5/25; patient presents for follow-up. He has no issues or complaints today. He has been using Dakin's wet-to-dry dressings to the right great toe wound. There is more maceration today. He has no complaints about HBO therapy. He denies systemic signs of infection. He continues antibiotics per ID. He has not scheduled his ABIs with TBI's yet. Patient History Information obtained from Patient, Caregiver, Chart. Family History Unknown History. Social History Current some day smoker - cigar, Alcohol Use - Never, Drug Use - No History, Caffeine Use - Moderate. Medical History Respiratory Denies history of Aspiration, Asthma, Chronic Obstructive Pulmonary Disease (COPD) Cardiovascular Patient has history of Hypertension Denies history of Angina, Arrhythmia, Congestive Heart Failure, Coronary Artery Disease, Deep Vein Thrombosis, Hypotension, Myocardial Infarction, Peripheral Arterial Disease, Peripheral Venous Disease, Phlebitis, Vasculitis Endocrine Patient has history of Type II Diabetes Musculoskeletal Patient has history of Osteomyelitis - Right Great Toe!!! Neurologic Patient has history of Neuropathy Hospitalization/Surgery History - I and D back. Medical A Surgical History Notes nd Psychiatric PTSD Objective Constitutional respirations regular, non-labored and within target range for patient.. Vitals Time Taken: 9:12 AM, Height: 69 in, Weight: 180 lbs, BMI: 26.6, Temperature: 98.2 F, Pulse: 100 bpm, Respiratory Rate: 16 breaths/min, Blood Pressure: 169/102 mmHg. Cardiovascular 2+ dorsalis pedis/posterior tibialis pulses. Psychiatric pleasant and  cooperative. General Notes: Right foot: T the right great toe there is an open wound with granulation tissue and nonviable tissue. The surrounding periwound is macerated. o Very minimal undermining to the 9 o'clock position. No surrounding signs of infection. Integumentary (Hair, Skin) Wound #1 status is Open. Original cause of wound was Gradually Appeared. The date acquired was: 10/10/2021. The wound has been in treatment 5 weeks. The wound is located on the Right,Medial T Great. The wound measures 1cm length x 0.8cm width x 0.3cm depth; 0.628cm^2 area and 0.188cm^3 volume. There oe is Fat Layer (Subcutaneous Tissue) exposed. There is no tunneling or undermining noted. There is a medium amount of serosanguineous drainage noted. The wound margin is distinct with the outline attached to the wound base. There is large (67-100%) red, pink granulation within the wound bed. There is a small (1- 33%) amount of necrotic tissue within the wound bed including Adherent Slough. General Notes: maceration noted, callous Assessment Active Problems ICD-10 Type 2 diabetes mellitus with foot ulcer Non-pressure  chronic ulcer of other part of right foot with necrosis of bone Acute hematogenous osteomyelitis, right ankle and foot Patient's wound has shown improvement in size and appearance since last clinic visit. I debrided nonviable tissue. Since he is having more maceration to the periwound I recommended stopping Dakin's wet-to-dry dressings and changing to gentamicin ointment to the wound bed. He can do this daily. I recommended continuing HBO therapy. Procedures Wound #1 Pre-procedure diagnosis of Wound #1 is a Diabetic Wound/Ulcer of the Lower Extremity located on the Right,Medial T Great .Severity of Tissue Pre oe Debridement is: Fat layer exposed. There was a Excisional Skin/Subcutaneous Tissue Debridement with a total area of 0.8 sq cm performed by Christian Shan, DO. With the following  instrument(s): Curette to remove Non-Viable tissue/material. Material removed includes Callus, Subcutaneous Tissue, and Slough after achieving pain control using Other (Benzocaine). No specimens were taken. A time out was conducted at 09:49, prior to the start of the procedure. A Minimum amount of bleeding was controlled with Pressure. The procedure was tolerated well. Post Debridement Measurements: 1cm length x 0.8cm width x 0.3cm depth; 0.188cm^3 volume. Character of Wound/Ulcer Post Debridement is stable. Severity of Tissue Post Debridement is: Fat layer exposed. Post procedure Diagnosis Wound #1: Same as Pre-Procedure Plan Follow-up Appointments: Return Appointment in 1 week. - w/ Dr. Heber [place] and Leveda Anna, RN Room # 7 6/1, 6/8 and 6/15 @ 9:30am. **Prescription for Gentamicin Cream sent to pharmacy** Other: - Speak with insurance to find an Endocrinologist. Let us know if you need a referral from [place]. Patient states approved for Vascular and will be getting appointment. Bathing/ Shower/ Hygiene: May shower with protection but do not get wound dressing(s) wet. Edema Control - Lymphedema / SCD / Other: Elevate legs to the level of the heart or above for 30 minutes daily and/or when sitting, a frequency of: Avoid standing for long periods of time. Moisturize legs daily. Off-Loading: Other: - Keep pressure off of right foot Hyperbaric Oxygen Therapy: Indication: - Wagner Grade 3/ + osteomyelitis Right Great Toe If appropriate for treatment, begin HBOT per protocol: 2.0 ATA for 90 Minutes with 2 Five (5) Minute Air Breaks T Number of Treatments: - 40 otal One treatments per day (delivered Monday through Friday unless otherwise specified in Special Instructions below): Finger stick Blood Glucose Pre- and Post- HBOT Treatment. Follow Hyperbaric Oxygen Glycemia Protocol Afrin (Oxymetazoline HCL) 0.05% nasal spray - 1 spray in both nostrils daily as needed prior to HBO treatment for difficulty  clearing ears Consults ordered were: Endocrinology - Referral to Dr. Hilliard Clark Ellison-Endocrinology for diabetes management. Patient has chronic toe wound and is undergoing HBO. The following medication(s) was prescribed: gentamicin topical 0.1 % cream 1 apply daily to the wound bed starting 11/23/2021 WOUND #1: - T Great Wound Laterality: Right, Medial oe Cleanser: Soap and Water 1 x Per Day/30 Days Discharge Instructions: May shower and wash wound with dial antibacterial soap and water prior to dressing change. Cleanser: Wound Cleanser (Generic) 1 x Per Day/30 Days Discharge Instructions: Cleanse the wound with wound cleanser prior to applying a clean dressing using gauze sponges, not tissue or cotton balls. Topical: Gentamicin 1 x Per Day/30 Days Discharge Instructions: As directed by physician Secondary Dressing: Woven Gauze Sponges 2x2 in 1 x Per Day/30 Days Discharge Instructions: Apply over primary dressing as directed. Secured With: Child psychotherapist, Sterile 2x75 (in/in) (Generic) 1 x Per Day/30 Days Discharge Instructions: Secure with stretch gauze as directed. Secured With: 56M Medipore H Soft  Cloth Surgical T ape, 4 x 10 (in/yd) (Generic) 1 x Per Day/30 Days Discharge Instructions: Secure with tape as directed. 1. In office sharp debridement 2. Gentamicin ointment, stop Dakin's wet-to-dry dressings 3. Continue HBO therapy 4. Follow-up in 1 week Electronic Signature(s) Signed: 11/23/2021 12:44:27 PM By: Christian Shan DO Entered By: [name] on 11/23/2021 12:42:51 -------------------------------------------------------------------------------- HxROS Details Patient Name: Date of Service: [name] RD, [place] [facility] 11/23/2021 8:45 A M Medical Record Number: [id number] Patient Account Number: [id number] Date of Birth/Sex: Treating RN: [birth year] (56 y.o. [name] Primary Care Provider: Clinic, [name] Other Clinician: Referring Provider: Treating  Provider/Extender: [name] Clinic, [name] in Treatment: 5 Information Obtained From Patient Caregiver Chart Respiratory Medical History: Negative for: Aspiration; Asthma; Chronic Obstructive Pulmonary Disease (COPD) Cardiovascular Medical History: Positive for: Hypertension Negative for: Angina; Arrhythmia; Congestive Heart Failure; Coronary Artery Disease; Deep Vein Thrombosis; Hypotension; Myocardial Infarction; Peripheral Arterial Disease; Peripheral Venous Disease; Phlebitis; Vasculitis Endocrine Medical History: Positive for: Type II Diabetes Treated with: Insulin Blood sugar tested every day: No Musculoskeletal Medical History: Positive for: Osteomyelitis - Right Great Toe!!! Neurologic Medical History: Positive for: Neuropathy Psychiatric Medical History: Past Medical History Notes: PTSD Immunizations Pneumococcal Vaccine: Received Pneumococcal Vaccination: No Implantable Devices None Hospitalization / Surgery History Type of Hospitalization/Surgery I and D back Family and Social History Unknown History: Yes; Current some day smoker - cigar; Alcohol Use: Never; Drug Use: No History; Caffeine Use: Moderate; Financial Concerns: No; Food, Clothing or Shelter Needs: No; Support System Lacking: No; Transportation Concerns: No Electronic Signature(s) Signed: 11/23/2021 12:44:27 PM By: Christian Shan DO Signed: 11/23/2021 4:54:47 PM By: [name] Entered By: [name] on 11/23/2021 12:39:41 -------------------------------------------------------------------------------- SuperBill Details Patient Name: Date of Service: [name] RD, [place] [facility] 11/23/2021 Medical Record Number: [id number] Patient Account Number: [id number] Date of Birth/Sex: Treating RN: 29-May-1966 (56 y.o. [name] Primary Care Provider: Clinic, [name] Other Clinician: Referring Provider: Treating Provider/Extender: [name] Clinic,  [name] in Treatment: 5 Diagnosis Coding ICD-10 Codes Code Description E11.621 Type 2 diabetes mellitus with foot ulcer L97.514 Non-pressure chronic ulcer of other part of right foot with necrosis of bone M86.071 Acute hematogenous osteomyelitis, right ankle and foot Facility Procedures CPT4 Code: [id number] I Description: [id number] - DEB SUBQ TISSUE 20 SQ CM/< CD-10 Diagnosis Description L97.514 Non-pressure chronic ulcer of other part of right foot with necrosis of bon Modifier: e Quantity: 1 Physician Procedures : CPT4 Code Description Modifier [id number] - WC PHYS LEVEL 2 - EST PT ICD-10 Diagnosis Description E11.621 Type 2 diabetes mellitus with foot ulcer L97.514 Non-pressure chronic ulcer of other part of right foot with necrosis of bone M86.071 Acute  hematogenous osteomyelitis, right ankle and foot Quantity: 1 : [id number] - WC PHYS SUBQ TISS 20 SQ CM ICD-10 Diagnosis Description L97.514 Non-pressure chronic ulcer of other part of right foot with necrosis of bone Quantity: 1 Electronic Signature(s) Signed: 11/23/2021 12:44:27 PM By: Christian Shan DO Entered By: [name] on 11/23/2021 12:44:04

## 2021-11-23 NOTE — Progress Notes (Signed)
CURITS, VINING ([id number]) Visit Report for 11/23/2021 Problem List Details Patient Name: Date of Service: [name] [facility] 11/23/2021 10:00 [place] Record Number: [id number] Patient Account Number: [id number] Date of Birth/Sex: Treating RN: 1966/02/07 (56 y.o. Janyth Contes Primary Care Provider: Clinic, [name] Other Clinician: Valeria Batman Referring Provider: Treating Provider/Extender: Fredirick Maudlin Clinic, [name] in Treatment: 5 Active Problems ICD-10 Encounter Code Description Active Date MDM Diagnosis E11.621 Type 2 diabetes mellitus with foot ulcer 10/19/2021 No Yes L97.514 Non-pressure chronic ulcer of other part of right foot with necrosis of bone 10/19/2021 No Yes M86.071 Acute hematogenous osteomyelitis, right ankle and foot 10/19/2021 No Yes Inactive Problems Resolved Problems Electronic Signature(s) Signed: 11/23/2021 2:24:51 PM By: Valeria Batman EMT Signed: 11/23/2021 2:38:58 PM By: Fredirick Maudlin MD FACS Entered By: Valeria Batman on 11/23/2021 14:24:50 -------------------------------------------------------------------------------- SuperBill Details Patient Name: Date of Service: [name] RD, [name] [facility] 11/23/2021 Medical Record Number: [id number] Patient Account Number: [id number] Date of Birth/Sex: Treating RN: 09-26-1965 (56 y.o. Janyth Contes Primary Care Provider: Clinic, [name] Other Clinician: Valeria Batman Referring Provider: Treating Provider/Extender: Fredirick Maudlin Clinic, [name] in Treatment: 5 Diagnosis Coding ICD-10 Codes Code Description E11.621 Type 2 diabetes mellitus with foot ulcer L97.514 Non-pressure chronic ulcer of other part of right foot with necrosis of bone M86.071 Acute hematogenous osteomyelitis, right ankle and foot Facility Procedures CPT4 Code: [id number] Description: G0277-(Facility Use Only) HBOT full body chamber, 49min , ICD-10 Diagnosis Description E11.621 Type 2 diabetes  mellitus with foot ulcer L97.514 Non-pressure chronic ulcer of other part of right foot with necrosis of bone Modifier: Quantity: 4 Physician Procedures : CPT4 Code Description Modifier [id number] - WC PHYS HYPERBARIC OXYGEN THERAPY ICD-10 Diagnosis Description E11.621 Type 2 diabetes mellitus with foot ulcer L97.514 Non-pressure chronic ulcer of other part of right foot with necrosis of bone M86.071  Acute hematogenous osteomyelitis, right ankle and foot Quantity: 1 Electronic Signature(s) Signed: 11/23/2021 2:24:44 PM By: Valeria Batman EMT Signed: 11/23/2021 2:38:58 PM By: Fredirick Maudlin MD FACS Entered By: Valeria Batman on 11/23/2021 14:24:43

## 2021-11-23 NOTE — Progress Notes (Addendum)
ARES, CARDOZO ([id number]) Visit Report for 11/23/2021 Arrival Information Details Patient Name: Date of Service: [name] [facility] 11/23/2021 10:00 A M Medical Record Number: [id number] Patient Account Number: [id number] Date of Birth/Sex: Treating RN: 19-Oct-1965 (56 y.o. [name] Primary Care Nyisha Clippard: Clinic, [name] Other Clinician: [name] Referring Carlisle Torgeson: Treating Zevin Nevares/Extender: [name] Clinic, [name] in Treatment: 5 Visit Information History Since Last Visit Pain Present Now: No Patient Arrived: Ambulatory Arrival Time: 08:14 Accompanied By: None Transfer Assistance: None Patient Requires Transmission-Based Precautions: No Patient Has Alerts: No Electronic Signature(s) Signed: 11/23/2021 2:15:38 PM By: [name] EMT Entered By: [name] on 11/23/2021 14:15:38 -------------------------------------------------------------------------------- Encounter Discharge Information Details Patient Name: Date of Service: [name], [name] 11/23/2021 10:00 A M Medical Record Number: [id number] Patient Account Number: [id number] Date of Birth/Sex: Treating RN: [birth year] (56 y.o. [name] Primary Care Cambrie Sonnenfeld: Clinic, [name] Other Clinician: [name] Referring Montgomery Rothlisberger: Treating Navaeh Kehres/Extender: [name] Clinic, [name] in Treatment: 5 Encounter Discharge Information Items Discharge Condition: Stable Ambulatory Status: Ambulatory Discharge Destination: Home Transportation: Private Auto Accompanied By: None Schedule Follow-up Appointment: Yes Clinical Summary of Care: Electronic Signature(s) Signed: 11/23/2021 2:25:30 PM By: [name] EMT Entered By: [name] on 11/23/2021 14:25:29 -------------------------------------------------------------------------------- Vitals Details Patient Name: Date of Service: [name], [name] 11/23/2021 10:00 A M Medical Record Number:  [id number] Patient Account Number: [id number] Date of Birth/Sex: Treating RN: March 10, 1966 (56 y.o. [name] Primary Care Rosena Bartle: Clinic, [name] Other Clinician: [name] Referring Klever Twyford: Treating Tynetta Bachmann/Extender: [name] Clinic, [name] in Treatment: 5 Vital Signs Time Taken: 09:12 Temperature (F): 98.2 Height (in): 69 Pulse (bpm): 100 Weight (lbs): 180 Respiratory Rate (breaths/min): 16 Body Mass Index (BMI): 26.6 Blood Pressure (mmHg): 169/102 Capillary Blood Glucose (mg/dl): 242 Reference Range: 80 - 120 mg / dl Notes The patient had a Wound Care Clinic visit, before treatment started. Electronic Signature(s) Signed: 11/23/2021 2:17:06 PM By: [name] EMT Entered By: [name] on 11/23/2021 14:17:06

## 2021-11-24 ENCOUNTER — Encounter (HOSPITAL_BASED_OUTPATIENT_CLINIC_OR_DEPARTMENT_OTHER): Payer: No Typology Code available for payment source | Admitting: General Surgery

## 2021-11-24 DIAGNOSIS — E11621 Type 2 diabetes mellitus with foot ulcer: Secondary | ICD-10-CM | POA: Diagnosis not present

## 2021-11-24 LAB — GLUCOSE, CAPILLARY
Glucose-Capillary: 430 mg/dL — ABNORMAL HIGH (ref 70–99)
Glucose-Capillary: 329 mg/dL — ABNORMAL HIGH (ref 70–99)

## 2021-11-24 NOTE — Progress Notes (Signed)
Christian, [name] ([id number]) Visit Report for 11/24/2021 Arrival Information Details Patient Name: Date of Service: [name] [facility] 11/24/2021 10:00 A M Medical Record Number: [id number] Patient Account Number: [id number] Date of Birth/Sex: Treating RN: 1965/07/24 (56 y.o. [name] Primary Care Read Bonelli: Clinic, [name] Other Clinician: [name] Referring Jamiere Gulas: Treating Chaunce Winkels/Extender: [name] Clinic, [name] in Treatment: 5 Visit Information History Since Last Visit All ordered tests and consults were completed: Yes Patient Arrived: Ambulatory Added or deleted any medications: No Arrival Time: 10:01 Any new allergies or adverse reactions: No Accompanied By: None Had a fall or experienced change in No Transfer Assistance: None activities of daily living that may affect Patient Requires Transmission-Based Precautions: No risk of falls: Patient Has Alerts: No Signs or symptoms of abuse/neglect since last visito No Hospitalized since last visit: No Implantable device outside of the clinic excluding No cellular tissue based products placed in the center since last visit: Pain Present Now: No Electronic Signature(s) Signed: 11/24/2021 1:31:20 PM By: [name] EMT Entered By: [name] on 11/24/2021 13:31:20 -------------------------------------------------------------------------------- Encounter Discharge Information Details Patient Name: Date of Service: [name] 11/24/2021 10:00 A M Medical Record Number: [id number] Patient Account Number: [id number] Date of Birth/Sex: Treating RN: 11/06/1965 (56 y.o. [name] Primary Care Semya Klinke: Clinic, [name] Other Clinician: [name] Referring Chayse Zatarain: Treating Patty Lopezgarcia/Extender: [name] Clinic, [name] in Treatment: 5 Encounter Discharge Information Items Discharge Condition: Stable Ambulatory Status: Ambulatory Discharge  Destination: Home Transportation: Private Auto Accompanied By: Wife Schedule Follow-up Appointment: No Clinical Summary of Care: Electronic Signature(s) Signed: 11/24/2021 1:37:10 PM By: [name] EMT Entered By: [name] on 11/24/2021 13:37:09 -------------------------------------------------------------------------------- Vitals Details Patient Name: Date of Service: [name] 11/24/2021 10:00 A M Medical Record Number: [id number] Patient Account Number: [id number] Date of Birth/Sex: Treating RN: 1966/04/08 (56 y.o. [name] Primary Care Apryle Stowell: Clinic, [name] Other Clinician: [name] Referring Danese Dorsainvil: Treating Hailei Besser/Extender: [name] Clinic, [name] in Treatment: 5 Vital Signs Time Taken: 10:18 Temperature (F): 97.8 Height (in): 69 Pulse (bpm): 100 Weight (lbs): 180 Respiratory Rate (breaths/min): 16 Body Mass Index (BMI): 26.6 Blood Pressure (mmHg): 138/87 Capillary Blood Glucose (mg/dl): 629 Reference Range: 80 - 120 mg / dl Electronic Signature(s) Signed: 11/24/2021 1:31:54 PM By: [name] EMT Entered By: [name] on 11/24/2021 13:31:54

## 2021-11-24 NOTE — Progress Notes (Addendum)
BALEN, PINAULT ([id number]) Visit Report for 11/24/2021 HBO Details Patient Name: Date of Service: [name] [facility] 11/24/2021 10:00 A M Medical Record Number: [id number] Patient Account Number: [id number] Date of Birth/Sex: Treating RN: 1965-10-09 (56 y.o. [name] Primary Care Cabell Lazenby: Clinic, [name] Other Clinician: [name] Referring Mikahla Wisor: Treating Suda Forbess/Extender: [name] Clinic, [name] in Treatment: 5 HBO Treatment Course Details Treatment Course Number: 1 Ordering Prudie Guthridge: [name] T Treatments Ordered: otal 40 HBO Treatment Start Date: 11/06/2021 HBO Indication: Diabetic Ulcer(s) of the Lower Extremity Standard/Conservative Wound Care tried and failed greater than or equal to 30 days Wound #1 Right, Medial T Great oe HBO Treatment Details Treatment Number: 13 Patient Type: Outpatient Chamber Type: Monoplace Chamber Serial #: [id number] Treatment Protocol: 2.0 ATA with 90 minutes oxygen, with two 5 minute air breaks Treatment Details Compression Rate Down: 2.0 psi / minute De-Compression Rate Up: 2.0 psi / minute A breaks and breathing ir Compress Tx Pressure periods Decompress Decompress Begins Reached (leave unused spaces Begins Ends blank) Chamber Pressure (ATA 1 2 2 2 2 2  --2 1 ) Clock Time (24 hr) 10:22 10:31 11:01 11:06 11:36 11:41 - - 12:11 12:18 Treatment Length: 116 (minutes) Treatment Segments: 4 Vital Signs Capillary Blood Glucose Reference Range: 80 - 120 mg / dl HBO Diabetic Blood Glucose Intervention Range: <131 mg/dl or >284 mg/dl Time Vitals Blood Respiratory Capillary Blood Glucose Pulse Action Type: Pulse: Temperature: Taken: Pressure: Rate: Glucose (mg/dl): Meter #: Oximetry (%) Taken: Pre 10:18 138/87 100 16 97.8 430 Post 12:20 166/96 100 16 97.8 329 Treatment Response Treatment Toleration: Well Treatment Completion Status: Treatment Completed without Adverse Event Treatment  Notes Dr. Lady Gary informed of the patient Blood Sugar. Additional Procedure Documentation Tissue Sevierity: Necrosis of bone Physician HBO Attestation: I certify that I supervised this HBO treatment in accordance with Medicare guidelines. A trained emergency response team is readily available per Yes hospital policies and procedures. Continue HBOT as ordered. Yes Electronic Signature(s) Signed: 11/24/2021 4:17:31 PM By: [name] MD FACS Previous Signature: 11/24/2021 1:35:55 PM Version By: [name] EMT Entered By: [name] on 11/24/2021 16:17:30 -------------------------------------------------------------------------------- HBO Safety Checklist Details Patient Name: Date of Service: [name], [name] 11/24/2021 10:00 A M Medical Record Number: [id number] Patient Account Number: [id number] Date of Birth/Sex: Treating RN: 07-09-1965 (56 y.o. [name] Primary Care Inaaya Vellucci: Clinic, [name] Other Clinician: [name] Referring Bettyanne Dittman: Treating Sanav Remer/Extender: [name] Clinic, [name] in Treatment: 5 HBO Safety Checklist Items Safety Checklist Consent Form Signed Patient voided / foley secured and emptied When did you last eato 0945 Last dose of injectable or oral agent 0700 Ostomy pouch emptied and vented if applicable NA All implantable devices assessed, documented and approved NA Intravenous access site secured and place NA Valuables secured Linens and cotton and cotton/polyester blend (less than 51% polyester) Personal oil-based products / skin lotions / body lotions removed Wigs or hairpieces removed NA Smoking or tobacco materials removed Books / newspapers / magazines / loose paper removed Cologne, aftershave, perfume and deodorant removed Jewelry removed (may wrap wedding band) NA Make-up removed NA Hair care products removed Battery operated devices (external) removed Heating patches and chemical  warmers removed Titanium eyewear removed NA Nail polish cured greater than 10 hours NA Casting material cured greater than 10 hours NA Hearing aids removed NA Loose dentures or partials removed NA Prosthetics have been removed NA Patient demonstrates correct use of air break device (if applicable) Patient concerns have been  addressed Patient grounding bracelet on and cord attached to chamber Specifics for Inpatients (complete in addition to above) Medication sheet sent with patient NA Intravenous medications needed or due during therapy sent with patient NA Drainage tubes (e.g. nasogastric tube or chest tube secured and vented) NA Endotracheal or Tracheotomy tube secured NA [name] deflated of air and inflated with saline NA Airway suctioned NA Notes The safety checklist was done before treatment started. Electronic Signature(s) Signed: 11/24/2021 1:33:51 PM By: [name] EMT Entered By: [name] on 11/24/2021 13:33:51

## 2021-11-24 NOTE — Progress Notes (Signed)
CHARLIS, HARNER ([id number]) Visit Report for 11/24/2021 Problem List Details Patient Name: Date of Service: [name] [facility] 11/24/2021 10:00 A M Medical Record Number: [id number] Patient Account Number: [id number] Date of Birth/Sex: Treating RN: [birth year] (55 y.o. [name] Primary Care Provider: Clinic, [name] Other Clinician: [name] Referring Provider: Treating Provider/Extender: [name] Clinic, [name] in Treatment: 5 Active Problems ICD-10 Encounter Code Description Active Date MDM Diagnosis E11.621 Type 2 diabetes mellitus with foot ulcer 10/19/2021 No Yes L97.514 Non-pressure chronic ulcer of other part of right foot with necrosis of bone 10/19/2021 No Yes M86.071 Acute hematogenous osteomyelitis, right ankle and foot 10/19/2021 No Yes Inactive Problems Resolved Problems Electronic Signature(s) Signed: 11/24/2021 1:36:26 PM By: [name] EMT Signed: 11/24/2021 4:19:40 PM By: [name] MD FACS Entered By: [name] on 11/24/2021 13:36:26 -------------------------------------------------------------------------------- SuperBill Details Patient Name: Date of Service: [name], [name] 11/24/2021 Medical Record Number: [id number] Patient Account Number: [id number] Date of Birth/Sex: Treating RN: 10/08/1965 (56 y.o. [name] Primary Care Provider: Clinic, [name] Other Clinician: [name] Referring Provider: Treating Provider/Extender: [name] Clinic, [name] in Treatment: 5 Diagnosis Coding ICD-10 Codes Code Description E11.621 Type 2 diabetes mellitus with foot ulcer L97.514 Non-pressure chronic ulcer of other part of right foot with necrosis of bone M86.071 Acute hematogenous osteomyelitis, right ankle and foot Facility Procedures CPT4 Code: [id number] Description: G0277-(Facility Use Only) HBOT full body chamber, , ICD-10 Diagnosis Description E11.621 Type 2 diabetes  mellitus with foot ulcer L97.514 Non-pressure chronic ulcer of other part of right foot with necrosis of bone M86.071 Acute  hematogenous osteomyelitis, right ankle and foot Modifier: Quantity: 4 Physician Procedures : CPT4 Code Description Modifier [id number] 99183 - WC PHYS HYPERBARIC OXYGEN THERAPY ICD-10 Diagnosis Description E11.621 Type 2 diabetes mellitus with foot ulcer L97.514 Non-pressure chronic ulcer of other part of right foot with necrosis of bone M86.071  Acute hematogenous osteomyelitis, right ankle and foot Quantity: 1 Electronic Signature(s) Signed: 11/24/2021 1:36:21 PM By: [name] EMT Signed: 11/24/2021 4:19:40 PM By: [name] MD FACS Entered By: [name] on 11/24/2021 13:36:21

## 2021-11-28 ENCOUNTER — Encounter (HOSPITAL_BASED_OUTPATIENT_CLINIC_OR_DEPARTMENT_OTHER): Payer: No Typology Code available for payment source | Admitting: Internal Medicine

## 2021-11-28 DIAGNOSIS — M86071 Acute hematogenous osteomyelitis, right ankle and foot: Secondary | ICD-10-CM | POA: Diagnosis not present

## 2021-11-28 DIAGNOSIS — E11621 Type 2 diabetes mellitus with foot ulcer: Secondary | ICD-10-CM

## 2021-11-28 DIAGNOSIS — L97514 Non-pressure chronic ulcer of other part of right foot with necrosis of bone: Secondary | ICD-10-CM

## 2021-11-28 LAB — GLUCOSE, CAPILLARY
Glucose-Capillary: 330 mg/dL — ABNORMAL HIGH (ref 70–99)
Glucose-Capillary: 307 mg/dL — ABNORMAL HIGH (ref 70–99)

## 2021-11-28 NOTE — Progress Notes (Signed)
[name], [name] ([id number]) Visit Report for 11/28/2021 SuperBill Details Patient Name: Date of Service: [name] [facility] 11/28/2021 Medical Record Number: [id number] Patient Account Number: [id number] Date of Birth/Sex: Treating RN: 1966-01-05 (56 y.o. [name] Primary Care Provider: Clinic, [name] Other Clinician: [name] Referring Provider: Treating Provider/Extender: [name] Clinic, [name] in Treatment: 5 Diagnosis Coding ICD-10 Codes Code Description E11.621 Type 2 diabetes mellitus with foot ulcer L97.514 Non-pressure chronic ulcer of other part of right foot with necrosis of bone M86.071 Acute hematogenous osteomyelitis, right ankle and foot Facility Procedures CPT4 Code Description Modifier Quantity [id number] G0277-(Facility Use Only) HBOT full body chamber, , 4 ICD-10 Diagnosis Description E11.621 Type 2 diabetes mellitus with foot ulcer L97.514 Non-pressure chronic ulcer of other part of right foot with necrosis of bone M86.071 Acute hematogenous osteomyelitis, right ankle and foot Physician Procedures Quantity CPT4 Code Description Modifier [id number] 99183 - WC PHYS HYPERBARIC OXYGEN THERAPY 1 ICD-10 Diagnosis Description E11.621 Type 2 diabetes mellitus with foot ulcer L97.514 Non-pressure chronic ulcer of other part of right foot with necrosis of bone M86.071 Acute hematogenous osteomyelitis, right ankle and foot Electronic Signature(s) Signed: 11/28/2021 3:53:38 PM By: [name] CHT EMT BS , , Signed: 11/28/2021 3:58:54 PM By: [name] DO Entered By: [name] on 11/28/2021 15:53:38

## 2021-11-28 NOTE — Progress Notes (Addendum)
DELYLE, WEIDER ([id number]) Visit Report for 11/28/2021 Arrival Information Details Patient Name: Date of Service: [name] [facility] 11/28/2021 10:00 A M Medical Record Number: [id number] Patient Account Number: [id number] Date of Birth/Sex: Treating RN: 09-01-1965 (56 y.o. [name] Primary Care Mykaylah Ballman: Clinic, [name] Other Clinician: [name] Referring Bibi Economos: Treating Allice Garro/Extender: [name] [facility], [name] in Treatment: 5 Visit Information History Since Last Visit All ordered tests and consults were completed: Yes Patient Arrived: Ambulatory Added or deleted any medications: No Arrival Time: 09:58 Any new allergies or adverse reactions: No Accompanied By: self Had a fall or experienced change in No Transfer Assistance: None activities of daily living that may affect Patient Identification Verified: Yes risk of falls: Secondary Verification Process Completed: Yes Signs or symptoms of abuse/neglect since last visito No Patient Requires Transmission-Based Precautions: No Hospitalized since last visit: No Patient Has Alerts: No Implantable device outside of the clinic excluding No cellular tissue based products placed in the center since last visit: Pain Present Now: No Electronic Signature(s) Signed: 11/28/2021 3:12:21 PM By: [name] CHT EMT BS , , Entered By: [name] on 11/28/2021 15:12:21 -------------------------------------------------------------------------------- Encounter Discharge Information Details Patient Name: Date of Service: [name], [name] 11/28/2021 10:00 A M Medical Record Number: [id number] Patient Account Number: [id number] Date of Birth/Sex: Treating RN: 1966/04/02 (56 y.o. [name] Primary Care Sharni Negron: Clinic, [name] Other Clinician: [name] Referring Sanjana Folz: Treating Dorthy Magnussen/Extender: [name] [facility], [name] in Treatment:  5 Encounter Discharge Information Items Discharge Condition: Stable Ambulatory Status: Ambulatory Discharge Destination: Home Transportation: Private Auto Accompanied By: self Schedule Follow-up Appointment: No Clinical Summary of Care: Electronic Signature(s) Signed: 11/28/2021 3:54:05 PM By: [name] CHT EMT BS , , Entered By: [name] on 11/28/2021 15:54:05 -------------------------------------------------------------------------------- Vitals Details Patient Name: Date of Service: [name], [name] 11/28/2021 10:00 A M Medical Record Number: [id number] Patient Account Number: [id number] Date of Birth/Sex: Treating RN: [birth year] (56 y.o. [name] Primary Care Tanasha Menees: Clinic, [name] Other Clinician: [name] Referring Sha Amer: Treating Marna Weniger/Extender: [name], [name] in Treatment: 5 Vital Signs Time Taken: 10:01 Temperature (F): 98.2 Height (in): 69 Pulse (bpm): 96 Weight (lbs): 180 Respiratory Rate (breaths/min): 18 Body Mass Index (BMI): 26.6 Blood Pressure (mmHg): 144/92 Capillary Blood Glucose (mg/dl): 010 Reference Range: 80 - 120 mg / dl Electronic Signature(s) Signed: 11/28/2021 3:13:00 PM By: [name] CHT EMT BS , , Entered By: [name] on 11/28/2021 15:13:00

## 2021-11-28 NOTE — Progress Notes (Addendum)
CHAUNCE, ORLOFF ([id number]) Visit Report for 11/28/2021 HBO Details Patient Name: Date of Service: [name] [facility] 11/28/2021 10:00 A M Medical Record Number: [id number] Patient Account Number: [id number] Date of Birth/Sex: Treating RN: 1965/10/02 (56 y.o. [name] Primary Care Temika Sutphin: Clinic, [name] Other Clinician: [name] Referring Sayge Brienza: Treating Jeremiyah Cullens/Extender: [name], [name] in Treatment: 5 HBO Treatment Course Details Treatment Course Number: 1 Ordering Destry Bezdek: [name] T Treatments Ordered: otal 40 HBO Treatment Start Date: 11/06/2021 HBO Indication: Diabetic Ulcer(s) of the Lower Extremity Standard/Conservative Wound Care tried and failed greater than or equal to 30 days Wound #1 Right, Medial T Great oe HBO Treatment Details Treatment Number: 14 Patient Type: Outpatient Chamber Type: Monoplace Chamber Serial #: [id number] Treatment Protocol: 2.0 ATA with 90 minutes oxygen, with two 5 minute air breaks Treatment Details Compression Rate Down: 2.0 psi / minute De-Compression Rate Up: 2.0 psi / minute A breaks and breathing ir Compress Tx Pressure periods Decompress Decompress Begins Reached (leave unused spaces Begins Ends blank) Chamber Pressure (ATA 1 2 2 2 2 2  --2 1 ) Clock Time (24 hr) 10:14 10:24 10:54 10:59 11:29 11:34 - - 12:04 12:14 Treatment Length: 120 (minutes) Treatment Segments: 4 Vital Signs Capillary Blood Glucose Reference Range: 80 - 120 mg / dl HBO Diabetic Blood Glucose Intervention Range: <131 mg/dl or >696 mg/dl Time Vitals Blood Respiratory Capillary Blood Glucose Pulse Action Type: Pulse: Temperature: Taken: Pressure: Rate: Glucose (mg/dl): Meter #: Oximetry (%) Taken: Pre 10:01 144/92 96 18 98.2 330 Post 12:14 154/92 92 18 98.1 307 Treatment Response Treatment Toleration: Well Treatment Completion Status: Treatment Completed without Adverse Event Additional Procedure  Documentation Tissue Sevierity: Fat layer exposed Physician HBO Attestation: I certify that I supervised this HBO treatment in accordance with Medicare guidelines. A trained emergency response team is readily available per Yes hospital policies and procedures. Continue HBOT as ordered. Yes Electronic Signature(s) Signed: 11/28/2021 3:58:54 PM By: [name] DO Previous Signature: 11/28/2021 3:53:08 PM Version By: [name] CHT EMT BS , , Previous Signature: 11/28/2021 3:52:39 PM Version By: [name] CHT EMT BS , , Previous Signature: 11/28/2021 3:20:39 PM Version By: [name] CHT EMT BS , , Entered By: [name] on 11/28/2021 15:57:52 -------------------------------------------------------------------------------- HBO Safety Checklist Details Patient Name: Date of Service: [name] RD, [name] [facility] 11/28/2021 10:00 A M Medical Record Number: [id number] Patient Account Number: [id number] Date of Birth/Sex: Treating RN: December 17, 1965 (56 y.o. [name] Primary Care Curtez Brallier: Clinic, [name] Other Clinician: [name] Referring Runette Scifres: Treating Lesha Jager/Extender: [name] [facility], [name] in Treatment: 5 HBO Safety Checklist Items Safety Checklist Consent Form Signed Patient voided / foley secured and emptied When did you last eato 0400 Last dose of injectable or oral agent 0400 Ostomy pouch emptied and vented if applicable NA All implantable devices assessed, documented and approved NA Intravenous access site secured and place NA Valuables secured Linens and cotton and cotton/polyester blend (less than 51% polyester) Personal oil-based products / skin lotions / body lotions removed Wigs or hairpieces removed NA Smoking or tobacco materials removed NA Books / newspapers / magazines / loose paper removed Cologne, aftershave, perfume and deodorant removed Jewelry removed (may wrap wedding band) Make-up  removed NA Hair care products removed Battery operated devices (external) removed Heating patches and chemical warmers removed Titanium eyewear removed NA Nail polish cured greater than 10 hours NA Casting material cured greater than 10 hours NA Hearing aids removed NA Loose dentures or partials  removed NA Prosthetics have been removed NA Patient demonstrates correct use of air break device (if applicable) Patient concerns have been addressed Patient grounding bracelet on and cord attached to chamber Specifics for Inpatients (complete in addition to above) Medication sheet sent with patient NA Intravenous medications needed or due during therapy sent with patient NA Drainage tubes (e.g. nasogastric tube or chest tube secured and vented) NA Endotracheal or Tracheotomy tube secured NA [name] deflated of air and inflated with saline NA Airway suctioned NA Notes The safety checklist was done before treatment started. Electronic Signature(s) Signed: 11/28/2021 3:16:10 PM By: [name] CHT EMT BS , , Entered By: [name] on 11/28/2021 15:16:10

## 2021-11-29 ENCOUNTER — Ambulatory Visit: Payer: No Typology Code available for payment source | Admitting: Infectious Disease

## 2021-11-29 ENCOUNTER — Other Ambulatory Visit: Payer: Self-pay

## 2021-11-29 ENCOUNTER — Encounter (HOSPITAL_BASED_OUTPATIENT_CLINIC_OR_DEPARTMENT_OTHER): Payer: No Typology Code available for payment source | Admitting: General Surgery

## 2021-11-29 DIAGNOSIS — E11621 Type 2 diabetes mellitus with foot ulcer: Secondary | ICD-10-CM | POA: Diagnosis not present

## 2021-11-29 LAB — GLUCOSE, CAPILLARY
Glucose-Capillary: 363 mg/dL — ABNORMAL HIGH (ref 70–99)
Glucose-Capillary: 341 mg/dL — ABNORMAL HIGH (ref 70–99)

## 2021-11-29 NOTE — Progress Notes (Signed)
KIRK, BASQUEZ ([id number]) Visit Report for 11/29/2021 Arrival Information Details Patient Name: Date of Service: [name] [facility] 11/29/2021 10:00 A M Medical Record Number: [id number] Patient Account Number: [id number] Date of Birth/Sex: Treating RN: [birth year] (56 y.o. [name] Primary Care Caelum Federici: Clinic, [name] Other Clinician: [name] Referring Mandy Fitzwater: Treating Twala Collings/Extender: [name] Clinic, [name] in Treatment: 5 Visit Information History Since Last Visit All ordered tests and consults were completed: Yes Patient Arrived: Ambulatory Added or deleted any medications: No Arrival Time: 09:40 Any new allergies or adverse reactions: No Accompanied By: self Had a fall or experienced change in No Transfer Assistance: None activities of daily living that may affect Patient Identification Verified: Yes risk of falls: Secondary Verification Process Completed: Yes Signs or symptoms of abuse/neglect since last visito No Patient Requires Transmission-Based Precautions: No Hospitalized since last visit: No Patient Has Alerts: No Implantable device outside of the clinic excluding No cellular tissue based products placed in the center since last visit: Has Dressing in Place as Prescribed: Yes Pain Present Now: No Electronic Signature(s) Signed: 11/29/2021 2:57:52 PM By: [name] CHT EMT BS , , Entered By: [name] on 11/29/2021 14:25:10 -------------------------------------------------------------------------------- Encounter Discharge Information Details Patient Name: Date of Service: [name], [name] 11/29/2021 10:00 A M Medical Record Number: [id number] Patient Account Number: [id number] Date of Birth/Sex: Treating RN: 05/01/1966 (56 y.o. [name] Primary Care Paymon Rosensteel: Clinic, [name] Other Clinician: [name] Referring Eddie Koc: Treating Ayleen Mckinstry/Extender: [name] Clinic,  [name] in Treatment: 5 Encounter Discharge Information Items Discharge Condition: Stable Ambulatory Status: Ambulatory Discharge Destination: Home Transportation: Private Auto Accompanied By: self Schedule Follow-up Appointment: No Clinical Summary of Care: Electronic Signature(s) Signed: 11/29/2021 2:57:52 PM By: [name] CHT EMT BS , , Entered By: [name] on 11/29/2021 14:23:21 -------------------------------------------------------------------------------- Vitals Details Patient Name: Date of Service: [name], [name] 11/29/2021 10:00 A M Medical Record Number: [id number] Patient Account Number: [id number] Date of Birth/Sex: Treating RN: 04/30/1966 (56 y.o. [name] Primary Care Derrall Hicks: Clinic, [name] Other Clinician: [name] Referring Priya Matsen: Treating Deshayla Empson/Extender: [name] [facility], [name] in Treatment: 5 Vital Signs Time Taken: 10:05 Temperature (F): 98.3 Height (in): 69 Pulse (bpm): 97 Weight (lbs): 180 Respiratory Rate (breaths/min): 18 Body Mass Index (BMI): 26.6 Blood Pressure (mmHg): 134/88 Capillary Blood Glucose (mg/dl): 850 Reference Range: 80 - 120 mg / dl Electronic Signature(s) Signed: 11/29/2021 2:57:52 PM By: [name] CHT EMT BS , , Entered By: [name] on 11/29/2021 10:49:59

## 2021-11-29 NOTE — Progress Notes (Signed)
GANESH, DEEG ([id number]) Visit Report for 11/29/2021 SuperBill Details Patient Name: Date of Service: [name] [facility] 11/29/2021 Medical Record Number: [id number] Patient Account Number: [id number] Date of Birth/Sex: Treating RN: [birth year] (55 y.o. 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 hematogenous osteomyelitis, right ankle and foot Facility Procedures CPT4 Code Description Modifier Quantity [id number] G0277-(Facility Use Only) HBOT full body chamber, , 4 ICD-10 Diagnosis Description E11.621 Type 2 diabetes mellitus with foot ulcer 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

## 2021-11-29 NOTE — Progress Notes (Signed)
[name], [name] ([id number]) Visit Report for 11/29/2021 HBO Details Patient Name: Date of Service: [name] [facility] 11/29/2021 10:00 A M Medical Record Number: [id number] Patient Account Number: [id number] Date of Birth/Sex: Treating RN: [birth year] (56 y.o. [name] Primary Care Soley Harriss: Clinic, [name] Other Clinician: [name] Referring Embrie Mikkelsen: Treating Kemia Wendel/Extender: [name] [facility], [name] in Treatment: 5 HBO Treatment Course Details Treatment Course Number: 1 Ordering Calianna Kim: [name] T Treatments Ordered: otal 40 HBO Treatment Start Date: 11/06/2021 HBO Indication: Diabetic Ulcer(s) of the Lower Extremity Standard/Conservative Wound Care tried and failed greater than or equal to 30 days Wound #1 Right, Medial T Great oe HBO Treatment Details Treatment Number: 15 Patient Type: Outpatient Chamber Type: Monoplace Chamber Serial #: [id number] Treatment Protocol: 2.0 ATA with 90 minutes oxygen, with two 5 minute air breaks Treatment Details Compression Rate Down: 2.0 psi / minute De-Compression Rate Up: 2.0 psi / minute A breaks and breathing ir Compress Tx Pressure periods Decompress Decompress Begins Reached (leave unused spaces Begins Ends blank) Chamber Pressure (ATA 1 2 2 2 2 2  --2 1 ) Clock Time (24 hr) 10:09 10:19 10:49 10:54 11:24 11:29 - - 11:59 12:07 Treatment Length: 118 (minutes) Treatment Segments: 4 Vital Signs Capillary Blood Glucose Reference Range: 80 - 120 mg / dl HBO Diabetic Blood Glucose Intervention Range: <131 mg/dl or >644 mg/dl Type: Time Vitals Blood Pulse: Respiratory Temperature: Capillary Blood Glucose Pulse Action Taken: Pressure: Rate: Glucose (mg/dl): Meter #: Oximetry (%) Taken: Pre 10:05 134/88 97 18 98.3 363 Alerted physician glucose level Post 12:10 158/94 92 18 98.6 341 Alerted physician glucose level Treatment Response Treatment Toleration: Well Treatment Completion Status:  Treatment Completed without Adverse Event Additional Procedure Documentation Tissue Sevierity: Fat layer exposed Physician HBO Attestation: I certify that I supervised this HBO treatment in accordance with Medicare guidelines. A trained emergency response team is readily available per Yes hospital policies and procedures. Continue HBOT as ordered. Yes Electronic Signature(s) Signed: 11/29/2021 2:57:52 PM By: [name] CHT EMT BS , , Signed: 11/29/2021 5:02:34 PM By: [name] MD FACS Previous Signature: 11/29/2021 12:09:11 PM Version By: [name] MD FACS Entered By: [name] on 11/29/2021 14:22:58 -------------------------------------------------------------------------------- HBO Safety Checklist Details Patient Name: Date of Service: [name] RD, [name] 11/29/2021 10:00 A M Medical Record Number: [id number] Patient Account Number: [id number] Date of Birth/Sex: Treating RN: 08/14/1965 (56 y.o. [name] Primary Care Hareem Surowiec: Clinic, [name] Other Clinician: [name] Referring Whitley Patchen: Treating Seerat Peaden/Extender: [name] [facility], [name] in Treatment: 5 HBO Safety Checklist Items Safety Checklist Consent Form Signed Patient voided / foley secured and emptied When did you last eato 0944 Last dose of injectable or oral agent 0600 Ostomy pouch emptied and vented if applicable NA All implantable devices assessed, documented and approved NA Intravenous access site secured and place NA Valuables secured Linens and cotton and cotton/polyester blend (less than 51% polyester) Personal oil-based products / skin lotions / body lotions removed Wigs or hairpieces removed NA Smoking or tobacco materials removed NA Books / newspapers / magazines / loose paper removed Cologne, aftershave, perfume and deodorant removed Jewelry removed (may wrap wedding band) Make-up removed NA Hair care products removed Battery  operated devices (external) removed Heating patches and chemical warmers removed Eyewear doesn't contain titanium per Titanium eyewear removed NA manufacturer Nail polish cured greater than 10 hours NA Casting material cured greater than 10 hours NA Hearing aids removed NA Loose dentures or partials removed NA Prosthetics have  been removed NA Patient demonstrates correct use of air break device (if applicable) Patient concerns have been addressed Patient grounding bracelet on and cord attached to chamber Specifics for Inpatients (complete in addition to above) Medication sheet sent with patient NA Intravenous medications needed or due during therapy sent with patient NA Drainage tubes (e.g. nasogastric tube or chest tube secured and vented) NA Endotracheal or Tracheotomy tube secured NA [name] deflated of air and inflated with saline NA Airway suctioned NA Notes Paper version used prior to treatment. Electronic Signature(s) Signed: 11/29/2021 2:57:52 PM By: [name] CHT EMT BS , , Entered By: [name] on 11/29/2021 10:53:41

## 2021-11-30 ENCOUNTER — Encounter (HOSPITAL_BASED_OUTPATIENT_CLINIC_OR_DEPARTMENT_OTHER): Payer: No Typology Code available for payment source | Attending: Internal Medicine | Admitting: Internal Medicine

## 2021-11-30 ENCOUNTER — Encounter (HOSPITAL_BASED_OUTPATIENT_CLINIC_OR_DEPARTMENT_OTHER): Payer: No Typology Code available for payment source | Admitting: General Surgery

## 2021-11-30 DIAGNOSIS — L97514 Non-pressure chronic ulcer of other part of right foot with necrosis of bone: Secondary | ICD-10-CM | POA: Insufficient documentation

## 2021-11-30 DIAGNOSIS — E11621 Type 2 diabetes mellitus with foot ulcer: Secondary | ICD-10-CM | POA: Diagnosis not present

## 2021-11-30 DIAGNOSIS — E1165 Type 2 diabetes mellitus with hyperglycemia: Secondary | ICD-10-CM | POA: Insufficient documentation

## 2021-11-30 DIAGNOSIS — E1140 Type 2 diabetes mellitus with diabetic neuropathy, unspecified: Secondary | ICD-10-CM | POA: Diagnosis not present

## 2021-11-30 DIAGNOSIS — M86071 Acute hematogenous osteomyelitis, right ankle and foot: Secondary | ICD-10-CM | POA: Insufficient documentation

## 2021-11-30 LAB — GLUCOSE, CAPILLARY: Glucose-Capillary: 436 mg/dL — ABNORMAL HIGH (ref 70–99)

## 2021-11-30 NOTE — Progress Notes (Signed)
ELLIS, COWELL ([id number]) Visit Report for 11/30/2021 Problem List Details Patient Name: Date of Service: [name] [facility] 11/30/2021 10:15 A M Medical Record Number: [id number] Patient Account Number: [id number] Date of Birth/Sex: Treating RN: May 02, 1966 (56 y.o. 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, right ankle and foot Facility Procedures CPT4 Code: [id number] Description: G0277-(Facility Use Only) HBOT full body chamber, 29min , ICD-10 Diagnosis Description E11.621 Type 2 diabetes  mellitus with foot ulcer L97.514 Non-pressure chronic ulcer of other part of right foot with necrosis of bone M86.071 Acute  hematogenous osteomyelitis, right ankle and foot Modifier: Quantity: 4 Physician Procedures : CPT4 Code Description Modifier [id number] - WC PHYS HYPERBARIC OXYGEN THERAPY ICD-10 Diagnosis Description E11.621 Type 2 diabetes mellitus with foot ulcer L97.514 Non-pressure chronic ulcer of other part of right foot with necrosis of bone M86.071  Acute hematogenous osteomyelitis, right ankle and foot Quantity: 1 Electronic Signature(s) Signed: 11/30/2021 12:59:42 PM By: Valeria Batman EMT Signed: 11/30/2021 4:49:27 PM By: Fredirick Maudlin MD FACS Entered By: Valeria Batman on 11/30/2021 12:59:41

## 2021-11-30 NOTE — Progress Notes (Signed)
HAMMOND, OBEIRNE ([id number]) Visit Report for 11/30/2021 Arrival Information Details Patient Name: Date of Service: [name] [facility] 11/30/2021 9:30 A M Medical Record Number: [id number] Patient Account Number: [id number] Date of Birth/Sex: Treating RN: 1966/04/26 (57 y.o. [name] Primary Care Dmitriy Gair: [name] Other Clinician: Referring Cloie Wooden: Treating Tyvon Eggenberger/Extender: [facility] [name] in Treatment: 6 Visit Information History Since Last Visit Added or deleted any medications: No Patient Arrived: Ambulatory Any new allergies or adverse reactions: No Arrival Time: 09:37 Had a fall or experienced change in No Transfer Assistance: None activities of daily living that may affect Patient Identification Verified: Yes risk of falls: Secondary Verification Process Completed: Yes Signs or symptoms of abuse/neglect since last visito No Patient Requires Transmission-Based Precautions: No Hospitalized since last visit: No Patient Has Alerts: No Implantable device outside of the clinic excluding No cellular tissue based products placed in the center since last visit: Has Dressing in Place as Prescribed: Yes Pain Present Now: No Electronic Signature(s) Signed: 11/30/2021 3:50:29 PM By: Lorrin Jackson Entered By: Lorrin Jackson on 11/30/2021 09:37:34 -------------------------------------------------------------------------------- Encounter Discharge Information Details Patient Name: Date of Service: [name], [name] 11/30/2021 9:30 [place] Record Number: [id number] Patient Account Number: [id number] Date of Birth/Sex: Treating RN: 1966-02-27 (56 y.o. [name] Primary Care Amarionna Arca: [name] Other Clinician: Referring Verbie Babic: Treating Sharren Schnurr/Extender: [facility] [name] in Treatment: 6 Encounter Discharge Information Items Post Procedure Vitals Discharge Condition: Stable Temperature  (F): 98.8 Ambulatory Status: Ambulatory Pulse (bpm): 105 Discharge Destination: Home Respiratory Rate (breaths/min): 16 Transportation: Private Auto Blood Pressure (mmHg): 159/87 Schedule Follow-up Appointment: Yes Clinical Summary of Care: Provided on 11/30/2021 Form Type Recipient Paper Patient Patient Electronic Signature(s) Signed: 11/30/2021 3:50:29 PM By: Lorrin Jackson Entered By: Lorrin Jackson on 11/30/2021 09:55:44 -------------------------------------------------------------------------------- Lower Extremity Assessment Details Patient Name: Date of Service: [name], [name] 11/30/2021 9:30 A M Medical Record Number: [id number] Patient Account Number: [id number] Date of Birth/Sex: Treating RN: 1966/01/30 (56 y.o. [name] Primary Care Felicitas Sine: [name] Other Clinician: Referring Cara Thaxton: Treating Golda Zavalza/Extender: [facility] [name] in Treatment: 6 Edema Assessment Assessed: [Left: No] [Right: Yes] Edema: [Left: Ye] [Right: s] Calf Left: Right: Point of Measurement: 37 cm From Medial Instep 38 cm Ankle Left: Right: Point of Measurement: 9 cm From Medial Instep 26 cm Vascular Assessment Pulses: Dorsalis Pedis Palpable: [Right:Yes] Electronic Signature(s) Signed: 11/30/2021 3:50:29 PM By: Lorrin Jackson Entered By: Lorrin Jackson on 11/30/2021 09:44:09 -------------------------------------------------------------------------------- Multi Wound Chart Details Patient Name: Date of Service: [name], [name] 11/30/2021 9:30 A M Medical Record Number: [id number] Patient Account Number: [id number] Date of Birth/Sex: Treating RN: Nov 09, 1965 (56 y.o. [name] Primary Care Toria Monte: [name] Other Clinician: Referring Shawnee Gambone: Treating Tahsin Benyo/Extender: [facility] [name] in Treatment: 6 Vital Signs Height(in): 69 Capillary Blood Glucose(mg/dl): 347 Weight(lbs):  180 Pulse(bpm): 105 Body Mass Index(BMI): 26.6 Blood Pressure(mmHg): 159/87 Temperature(F): 98.8 Respiratory Rate(breaths/min): 16 Photos: [1:Right, Medial T Great oe] [N/A:N/A N/A] Wound Location: [1:Gradually Appeared] [N/A:N/A] Wounding Event: [1:Diabetic Wound/Ulcer of the Lower] [N/A:N/A] Primary Etiology: [1:Extremity Hypertension, Type II Diabetes,] [N/A:N/A] Comorbid History: [1:Osteomyelitis, Neuropathy 10/10/2021] [N/A:N/A] Date Acquired: [1:6] [N/A:N/A] Weeks of Treatment: [1:Open] [N/A:N/A] Wound Status: [1:No] [N/A:N/A] Wound Recurrence: [1:0.5x0.4x0.3] [N/A:N/A] Measurements L x W x D (cm) [1:0.157] [N/A:N/A] A (cm) : rea [1:0.047] [N/A:N/A] Volume (cm) : [1:96.70%] [N/A:N/A] % Reduction in A [1:rea: 98.80%] [N/A:N/A] % Reduction in Volume: [1:Grade 3] [N/A:N/A] Classification: [1:Medium] [N/A:N/A] Exudate  Number: [id number] Patient Account Number: [id number] Date of Birth/Gender: Treating RN: 20-May-1966  (56 y.o. [name] Primary Care Physician: [name] Other Clinician: Referring Physician: Treating Physician/Extender: [facility] [name] in Treatment: 6 Education Assessment Education Provided To: Patient Education Topics Provided Smoking and Wound Healing: Methods: Explain/Verbal Responses: State content correctly Wound/Skin Impairment: Methods: Explain/Verbal, Printed Responses: State content correctly Electronic Signature(s) Signed: 11/30/2021 3:50:29 PM By: Lorrin Jackson Entered By: Lorrin Jackson on 11/30/2021 09:37:14 -------------------------------------------------------------------------------- Wound Assessment Details Patient Name: Date of Service: [name], [name] 11/30/2021 9:30 A M Medical Record Number: [id number] Patient Account Number: [id number] Date of Birth/Sex: Treating RN: [birth year] (56 y.o. [name] Primary Care Mimie Goering: [name] Other Clinician: Referring Danielle Mink: Treating Kharis Lapenna/Extender: [facility] [name] in Treatment: 6 Wound Status Wound Number: 1 Primary Etiology: Diabetic Wound/Ulcer of the Lower Extremity Wound Location: Right, Medial T Great oe Wound Status: Open Wounding Event: Gradually Appeared Comorbid Hypertension, Type II Diabetes, Osteomyelitis, Neuropathy History: Date Acquired: 10/10/2021 Weeks Of Treatment: 6 Clustered Wound: No Photos Wound Measurements Length: (cm) 0.5 Width: (cm) 0.4 Depth: (cm) 0.3 Area: (cm) 0.157 Volume: (cm) 0.047 % Reduction in Area: 96.7% % Reduction in Volume: 98.8% Epithelialization: Medium (34-66%) Tunneling: No Undermining: No Wound Description Classification: Grade 3 Wound Margin: Distinct, outline attached Exudate Amount: Medium Exudate Type: Serosanguineous Exudate Color: red, brown Foul Odor After Cleansing: No Slough/Fibrino Yes Wound Bed Granulation Amount: Large (67-100%) Exposed  Structure Granulation Quality: Red, Pink Fascia Exposed: No Necrotic Amount: Small (1-33%) Fat Layer (Subcutaneous Tissue) Exposed: Yes Necrotic Quality: Adherent Slough Tendon Exposed: No Muscle Exposed: No Joint Exposed: No Bone Exposed: No Assessment Notes Slight maceration, callous Treatment Notes Wound #1 (Toe Great) Wound Laterality: Right, Medial Cleanser Soap and Water Discharge Instruction: May shower and wash wound with dial antibacterial soap and water prior to dressing change. Wound Cleanser Discharge Instruction: Cleanse the wound with wound cleanser prior to applying a clean dressing using gauze sponges, not tissue or cotton balls. Peri-Wound Care Topical Gentamicin Discharge Instruction: As directed by physician Primary Dressing Secondary Dressing Woven Gauze Sponges 2x2 in Discharge Instruction: Apply over primary dressing as directed. Secured With Conforming Stretch Gauze Bandage, Sterile 2x75 (in/in) Discharge Instruction: Secure with stretch gauze as directed. 29M Medipore H Soft Cloth Surgical T ape, 4 x 10 (in/yd) Discharge Instruction: Secure with tape as directed. Compression Wrap Compression Stockings Add-Ons Electronic Signature(s) Signed: 11/30/2021 3:50:29 PM By: Lorrin Jackson Entered By: Lorrin Jackson on 11/30/2021 09:46:06 -------------------------------------------------------------------------------- Vitals Details Patient Name: Date of Service: [name], [name] 11/30/2021 9:30 A M Medical Record Number: [id number] Patient Account Number: [id number] Date of Birth/Sex: Treating RN: 1966-02-23 (56 y.o. [name] Primary Care Ethal Gotay: [name] Other Clinician: Referring Krishan Mcbreen: Treating Janecia Palau/Extender: [facility] [name] in Treatment: 6 Vital Signs Time Taken: 09:40 Temperature (F): 98.8 Height (in): 69 Pulse (bpm): 105 Weight (lbs): 180 Respiratory Rate (breaths/min): 16 Body Mass  Index (BMI): 26.6 Blood Pressure (mmHg): 159/87 Capillary Blood Glucose (mg/dl): 347 Reference Range: 80 - 120 mg / dl Electronic Signature(s) Signed: 11/30/2021 3:50:29 PM By: Lorrin Jackson Entered By: Lorrin Jackson on 11/30/2021 09:41:16  Number: [id number] Patient Account Number: [id number] Date of Birth/Gender: Treating RN: 20-May-1966  (56 y.o. [name] Primary Care Physician: [name] Other Clinician: Referring Physician: Treating Physician/Extender: [facility] [name] in Treatment: 6 Education Assessment Education Provided To: Patient Education Topics Provided Smoking and Wound Healing: Methods: Explain/Verbal Responses: State content correctly Wound/Skin Impairment: Methods: Explain/Verbal, Printed Responses: State content correctly Electronic Signature(s) Signed: 11/30/2021 3:50:29 PM By: Lorrin Jackson Entered By: Lorrin Jackson on 11/30/2021 09:37:14 -------------------------------------------------------------------------------- Wound Assessment Details Patient Name: Date of Service: [name], [name] 11/30/2021 9:30 A M Medical Record Number: [id number] Patient Account Number: [id number] Date of Birth/Sex: Treating RN: [birth year] (56 y.o. [name] Primary Care Mimie Goering: [name] Other Clinician: Referring Danielle Mink: Treating Kharis Lapenna/Extender: [facility] [name] in Treatment: 6 Wound Status Wound Number: 1 Primary Etiology: Diabetic Wound/Ulcer of the Lower Extremity Wound Location: Right, Medial T Great oe Wound Status: Open Wounding Event: Gradually Appeared Comorbid Hypertension, Type II Diabetes, Osteomyelitis, Neuropathy History: Date Acquired: 10/10/2021 Weeks Of Treatment: 6 Clustered Wound: No Photos Wound Measurements Length: (cm) 0.5 Width: (cm) 0.4 Depth: (cm) 0.3 Area: (cm) 0.157 Volume: (cm) 0.047 % Reduction in Area: 96.7% % Reduction in Volume: 98.8% Epithelialization: Medium (34-66%) Tunneling: No Undermining: No Wound Description Classification: Grade 3 Wound Margin: Distinct, outline attached Exudate Amount: Medium Exudate Type: Serosanguineous Exudate Color: red, brown Foul Odor After Cleansing: No Slough/Fibrino Yes Wound Bed Granulation Amount: Large (67-100%) Exposed  Structure Granulation Quality: Red, Pink Fascia Exposed: No Necrotic Amount: Small (1-33%) Fat Layer (Subcutaneous Tissue) Exposed: Yes Necrotic Quality: Adherent Slough Tendon Exposed: No Muscle Exposed: No Joint Exposed: No Bone Exposed: No Assessment Notes Slight maceration, callous Treatment Notes Wound #1 (Toe Great) Wound Laterality: Right, Medial Cleanser Soap and Water Discharge Instruction: May shower and wash wound with dial antibacterial soap and water prior to dressing change. Wound Cleanser Discharge Instruction: Cleanse the wound with wound cleanser prior to applying a clean dressing using gauze sponges, not tissue or cotton balls. Peri-Wound Care Topical Gentamicin Discharge Instruction: As directed by physician Primary Dressing Secondary Dressing Woven Gauze Sponges 2x2 in Discharge Instruction: Apply over primary dressing as directed. Secured With Conforming Stretch Gauze Bandage, Sterile 2x75 (in/in) Discharge Instruction: Secure with stretch gauze as directed. 29M Medipore H Soft Cloth Surgical T ape, 4 x 10 (in/yd) Discharge Instruction: Secure with tape as directed. Compression Wrap Compression Stockings Add-Ons Electronic Signature(s) Signed: 11/30/2021 3:50:29 PM By: Lorrin Jackson Entered By: Lorrin Jackson on 11/30/2021 09:46:06 -------------------------------------------------------------------------------- Vitals Details Patient Name: Date of Service: [name], [name] 11/30/2021 9:30 A M Medical Record Number: [id number] Patient Account Number: [id number] Date of Birth/Sex: Treating RN: 1966-02-23 (56 y.o. [name] Primary Care Ethal Gotay: [name] Other Clinician: Referring Krishan Mcbreen: Treating Janecia Palau/Extender: [facility] [name] in Treatment: 6 Vital Signs Time Taken: 09:40 Temperature (F): 98.8 Height (in): 69 Pulse (bpm): 105 Weight (lbs): 180 Respiratory Rate (breaths/min): 16 Body Mass  Index (BMI): 26.6 Blood Pressure (mmHg): 159/87 Capillary Blood Glucose (mg/dl): 347 Reference Range: 80 - 120 mg / dl Electronic Signature(s) Signed: 11/30/2021 3:50:29 PM By: Lorrin Jackson Entered By: Lorrin Jackson on 11/30/2021 09:41:16

## 2021-11-30 NOTE — Progress Notes (Signed)
Ensure, please consult the hospital dietitian for an appropriate substitute. Electronic Signature(s) Signed: 11/30/2021 11:55:17 AM By: [name] DO Entered By: [name] on 11/30/2021 11:07:54 -------------------------------------------------------------------------------- Problem List Details Patient Name: Date of Service: [name] RD, [name] 11/30/2021 9:30 A M Medical Record Number: [id number] Patient Account Number: [id number] Date of Birth/Sex: Treating RN: 07-04-1965 (56 y.o. Lytle Michaels Primary Care Provider: [name] Other Clinician: Referring Provider: Treating Provider/Extender: [name] [name] in Treatment: 6 Active Problems ICD-10 Encounter Code Description Active Date MDM Diagnosis E11.621 Type 2 diabetes mellitus with foot ulcer 10/19/2021 No Yes L97.514 Non-pressure chronic ulcer of other part of right foot with necrosis of bone 10/19/2021 No Yes M86.071 Acute hematogenous osteomyelitis, right ankle and foot 10/19/2021 No Yes Inactive Problems Resolved Problems Electronic Signature(s) Signed: 11/30/2021 11:55:17 AM By: [name] DO Entered By: [name] on 11/30/2021 11:05:32 -------------------------------------------------------------------------------- Progress Note Details Patient Name: Date of Service: [name] RD, [name] 11/30/2021 9:30 A M Medical Record Number: [id number] Patient  Account Number: [id number] Date of Birth/Sex: Treating RN: 07/16/1965 (56 y.o. 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 He had an MRI of the right foot on 10/16/2021 that showed signal changes in the first proximal and distal phalanx consistent with acute osteomyelitis. He states that he developed a wound spontaneously to this area 10 days ago. He does have significant peripheral neuropathy and does not have sensation to the wound area or bottom of his feet. He reports being on 3 different antibiotics including clindamycin and doxycycline in the past couple of weeks. He is currently on Augmentin based on wound cultures done in the ED. He has currently been keeping the area covered. He currently denies systemic signs of infection. 4/25; patient presents for follow-up. HBO orders have been placed and we are [facility]. He obtained his chest x-ray with no cardiopulmonary abnormalities. He has been using Dakin's wet-to-dry dressings to the toe wound. He currently denies systemic signs of infection. He is currently taking Augmentin. He has an infectious disease appointment on 5/2. He has not heard back about his ABIs with TBI's. 5/2; patient presents for follow-up. He has been using Dakin's wet-to-dry dressings without issues. He continues to take Augmentin daily. He saw Dr. Algis Liming, infectious disease today. At this time Augmentin is being continued. He has not heard to schedule his ABIs  with TBI's. He currently denies signs of infection. He has been approved for HBO treatment and will start in 1 week. 5/11; patient presents for follow-up. He has been using Dakin's wet-to-dry dressings without issues. He reports doing well with HBO therapy. Today he cannot make it to his treatment however will be back Friday. He has been consistent in his care. He currently denies signs of infection. 5/18; patient presents for follow-up. He has no issues or complaints today. He has not reached out to establish care with an endocrinologist. He does not want to establish care with an endocrinologist through his [place]. He states he will let me know which providers are covered under his secondary insurance so we  Ensure, please consult the hospital dietitian for an appropriate substitute. Electronic Signature(s) Signed: 11/30/2021 11:55:17 AM By: [name] DO Entered By: [name] on 11/30/2021 11:07:54 -------------------------------------------------------------------------------- Problem List Details Patient Name: Date of Service: [name] RD, [name] 11/30/2021 9:30 A M Medical Record Number: [id number] Patient Account Number: [id number] Date of Birth/Sex: Treating RN: 07-04-1965 (56 y.o. Lytle Michaels Primary Care Provider: [name] Other Clinician: Referring Provider: Treating Provider/Extender: [name] [name] in Treatment: 6 Active Problems ICD-10 Encounter Code Description Active Date MDM Diagnosis E11.621 Type 2 diabetes mellitus with foot ulcer 10/19/2021 No Yes L97.514 Non-pressure chronic ulcer of other part of right foot with necrosis of bone 10/19/2021 No Yes M86.071 Acute hematogenous osteomyelitis, right ankle and foot 10/19/2021 No Yes Inactive Problems Resolved Problems Electronic Signature(s) Signed: 11/30/2021 11:55:17 AM By: [name] DO Entered By: [name] on 11/30/2021 11:05:32 -------------------------------------------------------------------------------- Progress Note Details Patient Name: Date of Service: [name] RD, [name] 11/30/2021 9:30 A M Medical Record Number: [id number] Patient  Account Number: [id number] Date of Birth/Sex: Treating RN: 07/16/1965 (56 y.o. 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 He had an MRI of the right foot on 10/16/2021 that showed signal changes in the first proximal and distal phalanx consistent with acute osteomyelitis. He states that he developed a wound spontaneously to this area 10 days ago. He does have significant peripheral neuropathy and does not have sensation to the wound area or bottom of his feet. He reports being on 3 different antibiotics including clindamycin and doxycycline in the past couple of weeks. He is currently on Augmentin based on wound cultures done in the ED. He has currently been keeping the area covered. He currently denies systemic signs of infection. 4/25; patient presents for follow-up. HBO orders have been placed and we are awaiting [facility] approval. He obtained his chest x-ray with no cardiopulmonary abnormalities. He has been using Dakin's wet-to-dry dressings to the toe wound. He currently denies systemic signs of infection. He is currently taking Augmentin. He has an infectious disease appointment on 5/2. He has not heard back about his ABIs with TBI's. 5/2; patient presents for follow-up. He has been using Dakin's wet-to-dry dressings without issues. He continues to take Augmentin daily. He saw Dr. Algis Liming, infectious disease today. At this time Augmentin is being continued. He has not heard to schedule his ABIs  with TBI's. He currently denies signs of infection. He has been approved for HBO treatment and will start in 1 week. 5/11; patient presents for follow-up. He has been using Dakin's wet-to-dry dressings without issues. He reports doing well with HBO therapy. Today he cannot make it to his treatment however will be back Friday. He has been consistent in his care. He currently denies signs of infection. 5/18; patient presents for follow-up. He has no issues or complaints today. He has not reached out to establish care with an endocrinologist. He does not want to establish care with an endocrinologist through his [place]. He states he will let me know which providers are covered under his secondary insurance so we  Ensure, please consult the hospital dietitian for an appropriate substitute. Electronic Signature(s) Signed: 11/30/2021 11:55:17 AM By: [name] DO Entered By: [name] on 11/30/2021 11:07:54 -------------------------------------------------------------------------------- Problem List Details Patient Name: Date of Service: [name] RD, [name] 11/30/2021 9:30 A M Medical Record Number: [id number] Patient Account Number: [id number] Date of Birth/Sex: Treating RN: 07-04-1965 (56 y.o. Lytle Michaels Primary Care Provider: [name] Other Clinician: Referring Provider: Treating Provider/Extender: [name] [name] in Treatment: 6 Active Problems ICD-10 Encounter Code Description Active Date MDM Diagnosis E11.621 Type 2 diabetes mellitus with foot ulcer 10/19/2021 No Yes L97.514 Non-pressure chronic ulcer of other part of right foot with necrosis of bone 10/19/2021 No Yes M86.071 Acute hematogenous osteomyelitis, right ankle and foot 10/19/2021 No Yes Inactive Problems Resolved Problems Electronic Signature(s) Signed: 11/30/2021 11:55:17 AM By: [name] DO Entered By: [name] on 11/30/2021 11:05:32 -------------------------------------------------------------------------------- Progress Note Details Patient Name: Date of Service: [name] RD, [name] 11/30/2021 9:30 A M Medical Record Number: [id number] Patient  Account Number: [id number] Date of Birth/Sex: Treating RN: 07/16/1965 (56 y.o. 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 He had an MRI of the right foot on 10/16/2021 that showed signal changes in the first proximal and distal phalanx consistent with acute osteomyelitis. He states that he developed a wound spontaneously to this area 10 days ago. He does have significant peripheral neuropathy and does not have sensation to the wound area or bottom of his feet. He reports being on 3 different antibiotics including clindamycin and doxycycline in the past couple of weeks. He is currently on Augmentin based on wound cultures done in the ED. He has currently been keeping the area covered. He currently denies systemic signs of infection. 4/25; patient presents for follow-up. HBO orders have been placed and we are awaiting [facility] approval. He obtained his chest x-ray with no cardiopulmonary abnormalities. He has been using Dakin's wet-to-dry dressings to the toe wound. He currently denies systemic signs of infection. He is currently taking Augmentin. He has an infectious disease appointment on 5/2. He has not heard back about his ABIs with TBI's. 5/2; patient presents for follow-up. He has been using Dakin's wet-to-dry dressings without issues. He continues to take Augmentin daily. He saw Dr. Algis Liming, infectious disease today. At this time Augmentin is being continued. He has not heard to schedule his ABIs  with TBI's. He currently denies signs of infection. He has been approved for HBO treatment and will start in 1 week. 5/11; patient presents for follow-up. He has been using Dakin's wet-to-dry dressings without issues. He reports doing well with HBO therapy. Today he cannot make it to his treatment however will be back Friday. He has been consistent in his care. He currently denies signs of infection. 5/18; patient presents for follow-up. He has no issues or complaints today. He has not reached out to establish care with an endocrinologist. He does not want to establish care with an endocrinologist through his [place]. He states he will let me know which providers are covered under his secondary insurance so we  Ensure, please consult the hospital dietitian for an appropriate substitute. Electronic Signature(s) Signed: 11/30/2021 11:55:17 AM By: [name] DO Entered By: [name] on 11/30/2021 11:07:54 -------------------------------------------------------------------------------- Problem List Details Patient Name: Date of Service: [name] RD, [name] 11/30/2021 9:30 A M Medical Record Number: [id number] Patient Account Number: [id number] Date of Birth/Sex: Treating RN: 07-04-1965 (56 y.o. Lytle Michaels Primary Care Provider: [name] Other Clinician: Referring Provider: Treating Provider/Extender: [name] [name] in Treatment: 6 Active Problems ICD-10 Encounter Code Description Active Date MDM Diagnosis E11.621 Type 2 diabetes mellitus with foot ulcer 10/19/2021 No Yes L97.514 Non-pressure chronic ulcer of other part of right foot with necrosis of bone 10/19/2021 No Yes M86.071 Acute hematogenous osteomyelitis, right ankle and foot 10/19/2021 No Yes Inactive Problems Resolved Problems Electronic Signature(s) Signed: 11/30/2021 11:55:17 AM By: [name] DO Entered By: [name] on 11/30/2021 11:05:32 -------------------------------------------------------------------------------- Progress Note Details Patient Name: Date of Service: [name] RD, [name] 11/30/2021 9:30 A M Medical Record Number: [id number] Patient  Account Number: [id number] Date of Birth/Sex: Treating RN: 07/16/1965 (56 y.o. 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 He had an MRI of the right foot on 10/16/2021 that showed signal changes in the first proximal and distal phalanx consistent with acute osteomyelitis. He states that he developed a wound spontaneously to this area 10 days ago. He does have significant peripheral neuropathy and does not have sensation to the wound area or bottom of his feet. He reports being on 3 different antibiotics including clindamycin and doxycycline in the past couple of weeks. He is currently on Augmentin based on wound cultures done in the ED. He has currently been keeping the area covered. He currently denies systemic signs of infection. 4/25; patient presents for follow-up. HBO orders have been placed and we are awaiting [facility] approval. He obtained his chest x-ray with no cardiopulmonary abnormalities. He has been using Dakin's wet-to-dry dressings to the toe wound. He currently denies systemic signs of infection. He is currently taking Augmentin. He has an infectious disease appointment on 5/2. He has not heard back about his ABIs with TBI's. 5/2; patient presents for follow-up. He has been using Dakin's wet-to-dry dressings without issues. He continues to take Augmentin daily. He saw Dr. Algis Liming, infectious disease today. At this time Augmentin is being continued. He has not heard to schedule his ABIs  with TBI's. He currently denies signs of infection. He has been approved for HBO treatment and will start in 1 week. 5/11; patient presents for follow-up. He has been using Dakin's wet-to-dry dressings without issues. He reports doing well with HBO therapy. Today he cannot make it to his treatment however will be back Friday. He has been consistent in his care. He currently denies signs of infection. 5/18; patient presents for follow-up. He has no issues or complaints today. He has not reached out to establish care with an endocrinologist. He does not want to establish care with an endocrinologist through his [place]. He states he will let me know which providers are covered under his secondary insurance so we  to take Augmentin daily. He saw Dr. Algis Liming, infectious disease today. At this time Augmentin is being continued. He has not heard to schedule his ABIs with TBI's. He currently denies signs of infection. He has been approved for HBO treatment and will start in 1 week. 5/11; patient presents for follow-up. He has been using Dakin's wet-to-dry dressings without issues. He reports doing well with HBO therapy. Today he cannot make it to his treatment however will be back Friday. He has been consistent in his care. He currently denies signs of infection. 5/18; patient presents for follow-up. He has no issues or complaints today. He has not reached out to establish care with an endocrinologist. He does not want to establish care with an endocrinologist through his [place]. He states he will let me know which providers are covered under his secondary insurance so we can place a new referral. He has been using Dakin's wet-to-dry dressings to the right great toe wound. He has been completing HBO therapy without issues. He currently denies signs of infection. 5/25; patient presents for follow-up. He has no issues or complaints today. He has been using Dakin's wet-to-dry dressings to the right great toe wound. There is more maceration today. He has no complaints about HBO therapy. He denies systemic signs of infection. He continues antibiotics per ID. He has not scheduled his ABIs with TBI's yet. 6/1; patient presents for follow-up. He has been doing gentamicin ointment to the wound bed  daily. He reports improvement in wound healing. He continues HBO therapy without issues. He is working on getting his blood glucose levels under control. Electronic Signature(s) Signed: 11/30/2021 11:55:17 AM By: [name] DO Entered By: [name] on 11/30/2021 11:06:43 -------------------------------------------------------------------------------- Physical Exam Details Patient Name: Date of Service: [name] RD, [name] 11/30/2021 9:30 A M Medical Record Number: [id number] Patient Account Number: [id number] Date of Birth/Sex: Treating RN: 03/06/1966 (56 y.o. Lytle Michaels Primary Care Provider: [name] Other Clinician: Referring Provider: Treating Provider/Extender: [name] [name] in Treatment: 6 Constitutional respirations regular, non-labored and within target range for patient.. Cardiovascular 2+ dorsalis pedis/posterior tibialis pulses. Psychiatric pleasant and cooperative. Notes Right foot: T the right great toe there is an open wound with granulation tissue and [place]. Minimal undermining at the 9 o'clock position. No surrounding signs o of infection. Electronic Signature(s) Signed: 11/30/2021 11:55:17 AM By: [name] DO Entered By: [name] on 11/30/2021 11:07:45 -------------------------------------------------------------------------------- Physician Orders Details Patient Name: Date of Service: [name] RD, [name] 11/30/2021 9:30 A M Medical Record Number: [id number] Patient Account Number: [id number] Date of Birth/Sex: Treating RN: 28-Jun-1966 (56 y.o. Lytle Michaels Primary Care Provider: [name] Other Clinician: Referring Provider: Treating Provider/Extender: [name] [name] in Treatment: 6 Verbal / Phone Orders: No Diagnosis Coding ICD-10 Coding Code Description E11.621 Type 2 diabetes mellitus with foot ulcer L97.514 Non-pressure chronic ulcer of other  part of right foot with necrosis of bone M86.071 Acute hematogenous osteomyelitis, right ankle and foot Follow-up Appointments ppointment in 1 week. - w/ Dr. Mikey Bussing and Lennox Laity, RN Room # 7 6/8 and 6/15 @ 9:30am. Return A Other: - [facility] appt 12/05/21 Bathing/ Shower/ Hygiene May shower with protection but do not get wound dressing(s) wet. Edema Control - Lymphedema / SCD / Other Elevate legs to the level of the heart or above for 30 minutes daily and/or when sitting, a frequency of: Avoid standing for long periods of time. Moisturize legs daily. Off-Loading Other: - Keep pressure  Ensure, please consult the hospital dietitian for an appropriate substitute. Electronic Signature(s) Signed: 11/30/2021 11:55:17 AM By: [name] DO Entered By: [name] on 11/30/2021 11:07:54 -------------------------------------------------------------------------------- Problem List Details Patient Name: Date of Service: [name] RD, [name] 11/30/2021 9:30 A M Medical Record Number: [id number] Patient Account Number: [id number] Date of Birth/Sex: Treating RN: 07-04-1965 (56 y.o. Lytle Michaels Primary Care Provider: [name] Other Clinician: Referring Provider: Treating Provider/Extender: [name] [name] [facility] Treatment: 6 Active Problems ICD-10 Encounter Code Description Active Date MDM Diagnosis E11.621 Type 2 diabetes mellitus with foot ulcer 10/19/2021 No Yes L97.514 Non-pressure chronic ulcer of other part of right foot with necrosis of bone 10/19/2021 No Yes M86.071 Acute hematogenous osteomyelitis, right ankle and foot 10/19/2021 No Yes Inactive Problems Resolved Problems Electronic Signature(s) Signed: 11/30/2021 11:55:17 AM By: [name] DO Entered By: [name] on 11/30/2021 11:05:32 -------------------------------------------------------------------------------- Progress Note Details Patient Name: Date of Service: [name] RD, [name] 11/30/2021 9:30 A M Medical Record Number: [id number] Patient  Account Number: [id number] Date of Birth/Sex: Treating RN: 07/16/1965 (56 y.o. 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 He had an MRI of the right foot on 10/16/2021 that showed signal changes in the first proximal and distal phalanx consistent with acute osteomyelitis. He states that he developed a wound spontaneously to this area 10 days ago. He does have significant peripheral neuropathy and does not have sensation to the wound area or bottom of his feet. He reports being on 3 different antibiotics including clindamycin and doxycycline in the past couple of weeks. He is currently on Augmentin based on wound cultures done in the ED. He has currently been keeping the area covered. He currently denies systemic signs of infection. 4/25; patient presents for follow-up. HBO orders have been placed and we are awaiting [facility] approval. He obtained his chest x-ray with no cardiopulmonary abnormalities. He has been using Dakin's wet-to-dry dressings to the toe wound. He currently denies systemic signs of infection. He is currently taking Augmentin. He has an infectious disease appointment on 5/2. He has not heard back about his ABIs with TBI's. 5/2; patient presents for follow-up. He has been using Dakin's wet-to-dry dressings without issues. He continues to take Augmentin daily. He saw Dr. Algis Liming, infectious disease today. At this time Augmentin is being continued. He has not heard to schedule his ABIs  with TBI's. He currently denies signs of infection. He has been approved for HBO treatment and will start in 1 week. 5/11; patient presents for follow-up. He has been using Dakin's wet-to-dry dressings without issues. He reports doing well with HBO therapy. Today he cannot make it to his treatment however will be back Friday. He has been consistent in his care. He currently denies signs of infection. 5/18; patient presents for follow-up. He has no issues or complaints today. He has not reached out to establish care with an endocrinologist. He does not want to establish care with an endocrinologist through his [place]. He states he will let me know which providers are covered under his secondary insurance so we  to take Augmentin daily. He saw Dr. Algis Liming, infectious disease today. At this time Augmentin is being continued. He has not heard to schedule his ABIs with TBI's. He currently denies signs of infection. He has been approved for HBO treatment and will start in 1 week. 5/11; patient presents for follow-up. He has been using Dakin's wet-to-dry dressings without issues. He reports doing well with HBO therapy. Today he cannot make it to his treatment however will be back Friday. He has been consistent in his care. He currently denies signs of infection. 5/18; patient presents for follow-up. He has no issues or complaints today. He has not reached out to establish care with an endocrinologist. He does not want to establish care with an endocrinologist through his [place]. He states he will let me know which providers are covered under his secondary insurance so we can place a new referral. He has been using Dakin's wet-to-dry dressings to the right great toe wound. He has been completing HBO therapy without issues. He currently denies signs of infection. 5/25; patient presents for follow-up. He has no issues or complaints today. He has been using Dakin's wet-to-dry dressings to the right great toe wound. There is more maceration today. He has no complaints about HBO therapy. He denies systemic signs of infection. He continues antibiotics per ID. He has not scheduled his ABIs with TBI's yet. 6/1; patient presents for follow-up. He has been doing gentamicin ointment to the wound bed  daily. He reports improvement in wound healing. He continues HBO therapy without issues. He is working on getting his blood glucose levels under control. Electronic Signature(s) Signed: 11/30/2021 11:55:17 AM By: [name] DO Entered By: [name] on 11/30/2021 11:06:43 -------------------------------------------------------------------------------- Physical Exam Details Patient Name: Date of Service: [name] RD, [name] 11/30/2021 9:30 A M Medical Record Number: [id number] Patient Account Number: [id number] Date of Birth/Sex: Treating RN: 03/06/1966 (56 y.o. Lytle Michaels Primary Care Provider: [name] Other Clinician: Referring Provider: Treating Provider/Extender: [name] [facility], [name] in Treatment: 6 Constitutional respirations regular, non-labored and within target range for patient.. Cardiovascular 2+ dorsalis pedis/posterior tibialis pulses. Psychiatric pleasant and cooperative. Notes Right foot: T the right great toe there is an open wound with granulation tissue and Slough. Minimal undermining at the 9 o'clock position. No surrounding signs o of infection. Electronic Signature(s) Signed: 11/30/2021 11:55:17 AM By: [name] DO Entered By: [name] on 11/30/2021 11:07:45 -------------------------------------------------------------------------------- Physician Orders Details Patient Name: Date of Service: [name] RD, [name] 11/30/2021 9:30 A M Medical Record Number: [id number] Patient Account Number: [id number] Date of Birth/Sex: Treating RN: 28-Jun-1966 (56 y.o. Lytle Michaels Primary Care Provider: [name] Other Clinician: Referring Provider: Treating Provider/Extender: [name], [name] in Treatment: 6 Verbal / Phone Orders: No Diagnosis Coding ICD-10 Coding Code Description E11.621 Type 2 diabetes mellitus with foot ulcer L97.514 Non-pressure chronic ulcer of other  part of right foot with necrosis of bone M86.071 Acute hematogenous osteomyelitis, right ankle and foot Follow-up Appointments ppointment in 1 week. - w/ Dr. Mikey Bussing and Lennox Laity, RN Room # 7 6/8 and 6/15 @ 9:30am. Return A Other: - [facility] appt 12/05/21 Bathing/ Shower/ Hygiene May shower with protection but do not get wound dressing(s) wet. Edema Control - Lymphedema / SCD / Other Elevate legs to the level of the heart or above for 30 minutes daily and/or when sitting, a frequency of: Avoid standing for long periods of time. Moisturize legs daily. Off-Loading Other: - Keep pressure

## 2021-12-01 ENCOUNTER — Encounter (HOSPITAL_BASED_OUTPATIENT_CLINIC_OR_DEPARTMENT_OTHER): Payer: No Typology Code available for payment source | Admitting: Internal Medicine

## 2021-12-01 DIAGNOSIS — L97514 Non-pressure chronic ulcer of other part of right foot with necrosis of bone: Secondary | ICD-10-CM | POA: Diagnosis not present

## 2021-12-01 DIAGNOSIS — M86071 Acute hematogenous osteomyelitis, right ankle and foot: Secondary | ICD-10-CM

## 2021-12-01 DIAGNOSIS — E11621 Type 2 diabetes mellitus with foot ulcer: Secondary | ICD-10-CM | POA: Diagnosis not present

## 2021-12-01 LAB — GLUCOSE, CAPILLARY
Glucose-Capillary: 269 mg/dL — ABNORMAL HIGH (ref 70–99)
Glucose-Capillary: 276 mg/dL — ABNORMAL HIGH (ref 70–99)
Glucose-Capillary: 226 mg/dL — ABNORMAL HIGH (ref 70–99)

## 2021-12-04 ENCOUNTER — Encounter: Payer: Self-pay | Admitting: Infectious Disease

## 2021-12-04 ENCOUNTER — Other Ambulatory Visit: Payer: Self-pay

## 2021-12-04 ENCOUNTER — Encounter (HOSPITAL_BASED_OUTPATIENT_CLINIC_OR_DEPARTMENT_OTHER): Payer: No Typology Code available for payment source | Admitting: General Surgery

## 2021-12-04 ENCOUNTER — Ambulatory Visit (INDEPENDENT_AMBULATORY_CARE_PROVIDER_SITE_OTHER): Payer: No Typology Code available for payment source | Admitting: Infectious Disease

## 2021-12-04 VITALS — BP 156/91 | HR 96 | Temp 97.8°F | Wt 182.0 lb

## 2021-12-04 DIAGNOSIS — M869 Osteomyelitis, unspecified: Secondary | ICD-10-CM

## 2021-12-04 DIAGNOSIS — E11621 Type 2 diabetes mellitus with foot ulcer: Secondary | ICD-10-CM

## 2021-12-04 DIAGNOSIS — I10 Essential (primary) hypertension: Secondary | ICD-10-CM

## 2021-12-04 DIAGNOSIS — F4310 Post-traumatic stress disorder, unspecified: Secondary | ICD-10-CM | POA: Diagnosis not present

## 2021-12-04 DIAGNOSIS — F17200 Nicotine dependence, unspecified, uncomplicated: Secondary | ICD-10-CM

## 2021-12-04 LAB — GLUCOSE, CAPILLARY
Glucose-Capillary: 286 mg/dL — ABNORMAL HIGH (ref 70–99)
Glucose-Capillary: 244 mg/dL — ABNORMAL HIGH (ref 70–99)

## 2021-12-04 MED ORDER — AMOXICILLIN-POT CLAVULANATE 875-125 MG PO TABS: 1.0000 | ORAL_TABLET | Freq: Two times a day (BID) | ORAL | 3 refills | Status: DC

## 2021-12-04 NOTE — Progress Notes (Signed)
Subjective:  Complaint follow-up for diabetic foot ulcer with osteomyelitis   Patient ID: [name], male    DOB: 02-Jan-1966, 56 y.o.   MRN: [id number]  HPI   [name] is a very pleasant veteran who has history of diabetes mellitus with neuropathy and retinopathy, history of small bowel obstruction PTSD gastroesophageal reflux disease hypertension cigar smoking who has been dealing with a wound in his right big toe for many years.  He was having it debrided by one of the podiatrist at the [facility].  He had had several x-rays and an MRI even done this spring that it failed to show evidence of osteomyelitis.  In the interim however he had experienced extreme swelling of his toe and was evaluated in an emergency department in [place].  Note shortly before his toe became grossly swollen and tender he had also had an MRI that showed osteomyelitis involving the first distal phalanx and first proximal phalanx on the right side.  When he went to the emergency department apparently was gross pus coming from the wound which was cultured and grew Klebsiella enterology needs a group B streptococcus and a Prevotella species.  Had been on clindamycin was changed to Augmentin.  Note the Klebsiella was resistant to cefazolin cefoxitin sensitive to Zosyn ceftriaxone ciprofloxacin and Bactrim.  The Augmentin would not of been active against this organism that would have been against the group B strep in the Prevotella.  Regardless he had dramatic improvement in the size of his toe which went down considerably.  He has been seen by [name] with wound care and is following with her closely with improvement in the physical appearance of his toe he is also planned for hyperbaric oxygen therapy.  He was referred to [place] for evaluation and treatment of his osteomyelitis.  I opted to continue his Augmentin since he had a clinical response to it despite the fact that was not covering  all the organisms isolated on culture.  I referred him to [name] with [facility] but that required prior authorization the [facility] association which is just come in in the last 2 days.  His ABI is also on schedule finally.  He has been going to hyperbaric oxygen therapy and wound care diligently and is quite happy with the progress of his wound.    Past Medical History:  Diagnosis Date   Arthritis    Diabetes mellitus    Fistula, perirectal    GERD (gastroesophageal reflux disease)    Hypertension    Osteomyelitis of great toe of right foot (HCC) 10/31/2021   Polymicrobial bacterial infection 10/31/2021   PTSD (post-traumatic stress disorder)    PTSD (post-traumatic stress disorder) 10/31/2021   Small bowel obstruction (HCC)     Past Surgical History:  Procedure Laterality Date   RECTAL SURGERY      No family history on file.    Social History   Socioeconomic History   Marital status: Married    Spouse name: Not on file   Number of children: Not on file   Years of education: Not on file   Highest education level: Not on file  Occupational History   Not on file  Tobacco Use   Smoking status: Some Days    Types: Cigars   Smokeless tobacco: Never   Tobacco comments:    Occasional cigar  Vaping Use   Vaping Use: Never used  Substance and Sexual Activity   Alcohol use: Not Currently  Comment: occ   Drug use: No   Sexual activity: Not on file  Other Topics Concern   Not on file  Social History Narrative   Not on file   Social Determinants of Health   Financial Resource Strain: Not on file  Food Insecurity: Not on file  Transportation Needs: Not on file  Physical Activity: Not on file  Stress: Not on file  Social Connections: Not on file    Allergies  Allergen Reactions   Lisinopril Other (See Comments)    Other reaction(s): Electrocardiogram abnormal   Sildenafil Nausea And Vomiting     Current Outpatient Medications:     amLODipine (NORVASC) 10 MG tablet, Take 10 mg by mouth daily., Disp: , Rfl:    amoxicillin-clavulanate (AUGMENTIN) 875-125 MG tablet, Take 1 tablet by mouth every 12 (twelve) hours., Disp: 60 tablet, Rfl: 2   diphenhydrAMINE (BENADRYL) 25 MG tablet, Take 1 tablet (25 mg total) by mouth every 6 (six) hours as needed for itching or allergies., Disp: 12 tablet, Rfl: 0   docusate sodium (COLACE) 100 MG capsule, Take 1 capsule (100 mg total) by mouth every 12 (twelve) hours. (Patient not taking: Reported on 10/31/2021), Disp: 60 capsule, Rfl: 0   Dulaglutide (TRULICITY [place]), Inject into the skin. (Patient not taking: Reported on 10/31/2021), Disp: , Rfl:    escitalopram (LEXAPRO) 10 MG tablet, Take 10 mg by mouth daily. (Patient not taking: Reported on 10/31/2021), Disp: , Rfl:    glipiZIDE (GLUCOTROL) 10 MG tablet, Take 1 tablet (10 mg total) by mouth 2 (two) times daily before a meal. (Patient not taking: Reported on 10/31/2021), Disp: 60 tablet, Rfl: 2   HYDROCHLOROTHIAZIDE PO, Take by mouth., Disp: , Rfl:    insulin aspart protamine- aspart (NOVOLOG MIX 70/30) (70-30) 100 UNIT/ML injection, Inject into the skin. (Patient not taking: Reported on 06/23/2021), Disp: , Rfl:    lisinopril (PRINIVIL,ZESTRIL) 10 MG tablet, Take 10 mg by mouth daily. (Patient not taking: Reported on 10/31/2021), Disp: , Rfl:    loratadine (CLARITIN) 10 MG tablet, TAKE 1 TABLET BY MOUTH ONCE DAILY, Disp: 90 tablet, Rfl: 30   losartan (COZAAR) 100 MG tablet, Take 100 mg by mouth daily. (Patient not taking: Reported on 06/23/2021), Disp: , Rfl:    OMEPRAZOLE PO, Take by mouth. (Patient not taking: Reported on 10/31/2021), Disp: , Rfl:    oxyCODONE-acetaminophen (PERCOCET/ROXICET) 5-325 MG per tablet, Take 2 tablets by mouth every 4 (four) hours as needed for pain., Disp: 6 tablet, Rfl: 0   polyethylene glycol (MIRALAX / GLYCOLAX) 17 g packet, Take 17 g by mouth daily., Disp: 14 each, Rfl: 1   Semaglutide (OZEMPIC, 1 MG/DOSE, [place]), Inject 1 mg  into the skin once a week., Disp: , Rfl:    SUMAtriptan (IMITREX) 50 MG tablet, Take 50 mg by mouth every 2 (two) hours as needed for migraine or headache. May repeat in 2 hours if headache persists or recurs., Disp: , Rfl:    testosterone cypionate (DEPOTESTOTERONE CYPIONATE) 100 MG/ML injection, Inject 100 mg into the muscle every 14 (fourteen) days. For IM use only, Disp: , Rfl:    Zolpidem Tartrate (AMBIEN PO), Take by mouth., Disp: , Rfl:    Review of Systems  Constitutional:  Negative for activity change, appetite change, chills, diaphoresis, fatigue, fever and unexpected weight change.  HENT:  Negative for congestion, rhinorrhea, sinus pressure, sneezing, sore throat and trouble swallowing.   Eyes:  Negative for photophobia and visual disturbance.  Respiratory:  Negative for  cough, chest tightness, shortness of breath, wheezing and stridor.   Cardiovascular:  Negative for chest pain, palpitations and leg swelling.  Gastrointestinal:  Negative for abdominal distention, abdominal pain, anal bleeding, blood in stool, constipation, diarrhea, nausea and vomiting.  Genitourinary:  Negative for difficulty urinating, dysuria, flank pain and hematuria.  Musculoskeletal:  Negative for arthralgias, back pain, gait problem, joint swelling and myalgias.  Skin:  Negative for color change, pallor, rash and wound.  Neurological:  Negative for dizziness, tremors, weakness and light-headedness.  Hematological:  Negative for adenopathy. Does not bruise/bleed easily.  Psychiatric/Behavioral:  Negative for agitation, behavioral problems, confusion, decreased concentration, dysphoric mood and sleep disturbance.       Objective:   Physical Exam Constitutional:      Appearance: He is well-developed.  HENT:     Head: Normocephalic and atraumatic.  Eyes:     Conjunctiva/sclera: Conjunctivae normal.  Cardiovascular:     Rate and Rhythm: Normal rate and regular rhythm.  Pulmonary:     Effort: Pulmonary  effort is normal. No respiratory distress.     Breath sounds: No wheezing.  Abdominal:     General: There is no distension.     Palpations: Abdomen is soft.  Musculoskeletal:        General: No tenderness. Normal range of motion.     Cervical back: Normal range of motion and neck supple.  Skin:    General: Skin is warm and dry.     Coloration: Skin is not pale.     Findings: No erythema or rash.  Neurological:     General: No focal deficit present.     Mental Status: He is alert and oriented to person, place, and time.  Psychiatric:        Mood and Affect: Mood normal.        Behavior: Behavior normal.        Thought Content: Thought content normal.        Judgment: Judgment normal.    Great toe 10/31/2021:     12/04/2021:          Assessment & Plan:  Polymicrobial osteomyelitis of the great toe: Continue Augmentin which have given him additional refills and sent to [facility].  Check a sed rate CRP BMP and CBC with differential.  I referred to [name] he should be seeing him now that prior approval has been made the patient does not want to have surgery if at all possible though as I have mentioned in the past is likely he will ultimately need to have this done.  ABIs are also on schedule   Hypertension:    Diabetes mellitus: Recently well controlled with A1c down to 7.5 he is lost quite a fair bit amount of weight on Ozempic  Tobacco abuse: No longer smoking

## 2021-12-04 NOTE — Progress Notes (Signed)
[name], [name] ([id number]) Visit Report for 12/01/2021 SuperBill Details Patient Name: Date of Service: [name] [facility] 12/01/2021 Medical Record Number: [id number] Patient Account Number: [id number] Date of Birth/Sex: Treating RN: 1965/09/29 (56 y.o. [name] Primary Care Provider: [name] Other Clinician: [name] Referring Provider: Treating Provider/Extender: [name], [name] in Treatment: 6 Diagnosis Coding ICD-10 Codes Code Description E11.621 Type 2 diabetes mellitus with foot ulcer L97.514 Non-pressure chronic ulcer of other part of right foot with necrosis of bone M86.071 Acute hematogenous osteomyelitis, right ankle and foot Facility Procedures CPT4 Code Description Modifier Quantity [id number] G0277-(Facility Use Only) HBOT full body chamber, , 4 ICD-10 Diagnosis Description E11.621 Type 2 diabetes mellitus with foot ulcer L97.514 Non-pressure chronic ulcer of other part of right foot with necrosis of bone M86.071 Acute hematogenous osteomyelitis, right ankle and foot Physician Procedures Quantity CPT4 Code Description Modifier [id number] [id number] - WC PHYS HYPERBARIC OXYGEN THERAPY 1 ICD-10 Diagnosis Description E11.621 Type 2 diabetes mellitus with foot ulcer L97.514 Non-pressure chronic ulcer of other part of right foot with necrosis of bone M86.071 Acute hematogenous osteomyelitis, right ankle and foot Electronic Signature(s) Signed: 12/03/2021 12:16:43 AM By: [name] DO Signed: 12/04/2021 11:16:55 AM By: [name] CHT EMT BS , , Entered By: [name] on 12/01/2021 12:18:54

## 2021-12-04 NOTE — Progress Notes (Signed)
LUCAN, RINER ([id number]) Visit Report for 12/01/2021 Arrival Information Details Patient Name: Date of Service: [name] [facility] 12/01/2021 10:00 A M Medical Record Number: [id number] Patient Account Number: [id number] Date of Birth/Sex: Treating RN: 16-Oct-1965 (56 y.o. [name] Primary Care Jahnyla Parrillo: [name] Other Clinician: [name] Referring Timisha Mondry: Treating Shailene Demonbreun/Extender: [name], [name] in Treatment: 6 Visit Information History Since Last Visit All ordered tests and consults were completed: Yes Patient Arrived: Ambulatory Added or deleted any medications: No Arrival Time: 09:38 Any new allergies or adverse reactions: No Accompanied By: self Had a fall or experienced change in No Transfer Assistance: None activities of daily living that may affect Patient Identification Verified: Yes risk of falls: Secondary Verification Process Completed: Yes Signs or symptoms of abuse/neglect since last visito No Patient Requires Transmission-Based Precautions: No Hospitalized since last visit: No Patient Has Alerts: No Implantable device outside of the clinic excluding No cellular tissue based products placed in the center since last visit: Has Dressing in Place as Prescribed: Yes Pain Present Now: No Electronic Signature(s) Signed: 12/04/2021 11:16:55 AM By: [name] CHT EMT BS , , Entered By: [name] on 12/01/2021 10:41:07 -------------------------------------------------------------------------------- Encounter Discharge Information Details Patient Name: Date of Service: [name], [name] 12/01/2021 10:00 A M Medical Record Number: [id number] Patient Account Number: [id number] Date of Birth/Sex: Treating RN: 03/22/1966 (56 y.o. [name] Primary Care Roen Macgowan: [name] Other Clinician: [name] Referring Charlette Hennings: Treating Kalesha Irving/Extender: [name]  [name] in Treatment: 6 Encounter Discharge Information Items Discharge Condition: Stable Ambulatory Status: Ambulatory Discharge Destination: Home Transportation: Private Auto Accompanied By: self Schedule Follow-up Appointment: No Clinical Summary of Care: Electronic Signature(s) Signed: 12/04/2021 11:16:55 AM By: [name] CHT EMT BS , , Entered By: [name] on 12/01/2021 12:19:13 -------------------------------------------------------------------------------- Vitals Details Patient Name: Date of Service: [name], [name] 12/01/2021 10:00 A M Medical Record Number: [id number] Patient Account Number: [id number] Date of Birth/Sex: Treating RN: [name] (56 y.o. [name] Primary Care Staysha Truby: [name] Other Clinician: [name] Referring Kemora Pinard: Treating Gabryel Files/Extender: [name] [name] in Treatment: 6 Vital Signs Time Taken: 09:53 Temperature (F): 99.0 Height (in): 69 Pulse (bpm): 93 Weight (lbs): 180 Respiratory Rate (breaths/min): 16 Body Mass Index (BMI): 26.6 Blood Pressure (mmHg): 134/87 Capillary Blood Glucose (mg/dl): 568 Reference Range: 80 - 120 mg / dl Electronic Signature(s) Signed: 12/04/2021 11:16:55 AM By: [name] CHT EMT BS , , Entered By: [name] on 12/01/2021 10:43:03

## 2021-12-04 NOTE — Progress Notes (Signed)
MONTEZ, CUDA ([id number]) Visit Report for 12/04/2021 Problem List Details Patient Name: Date of Service: [name] [facility] 12/04/2021 11:00 A M Medical Record Number: [id number] Patient Account Number: [id number] Date of Birth/Sex: Treating RN: [birth year] (56 y.o. Tammy Sours Primary Care Provider: [name] Other Clinician: [name] Referring Provider: Treating Provider/Extender: [name] [facility], [name] in Treatment: 6 Active Problems ICD-10 Encounter Code Description Active Date MDM Diagnosis E11.621 Type 2 diabetes mellitus with foot ulcer 10/19/2021 No Yes L97.514 Non-pressure chronic ulcer of other part of right foot with necrosis of bone 10/19/2021 No Yes M86.071 Acute hematogenous osteomyelitis, right ankle and foot 10/19/2021 No Yes Inactive Problems Resolved Problems Electronic Signature(s) Signed: 12/04/2021 2:14:37 PM By: [name] EMT Signed: 12/04/2021 4:31:59 PM By: [name] MD FACS Entered By: [name] on 12/04/2021 14:14:37 -------------------------------------------------------------------------------- SuperBill Details Patient Name: Date of Service: [name], [name] 12/04/2021 Medical Record Number: [id number] Patient Account Number: [id number] Date of Birth/Sex: Treating RN: 02/27/1966 (56 y.o. Tammy Sours Primary Care Provider: [facility] [place] Other Clinician: [name] Referring Provider: Treating Provider/Extender: [name] [facility] [name] in Treatment: 6 Diagnosis Coding ICD-10 Codes Code Description E11.621 Type 2 diabetes mellitus with foot ulcer L97.514 Non-pressure chronic ulcer of other part of right foot with necrosis of bone M86.071 Acute hematogenous osteomyelitis, right ankle and foot Facility Procedures CPT4 Code: [id number] Description: G0277-(Facility Use Only) HBOT full body chamber, , ICD-10 Diagnosis Description E11.621 Type 2 diabetes mellitus  with foot ulcer L97.514 Non-pressure chronic ulcer of other part of right foot with necrosis of bone M86.071 Acute  hematogenous osteomyelitis, right ankle and foot Modifier: Quantity: 4 Physician Procedures : CPT4 Code Description Modifier [id number] - WC PHYS HYPERBARIC OXYGEN THERAPY ICD-10 Diagnosis Description E11.621 Type 2 diabetes mellitus with foot ulcer L97.514 Non-pressure chronic ulcer of other part of right foot with necrosis of bone M86.071  Acute hematogenous osteomyelitis, right ankle and foot Quantity: 1 Electronic Signature(s) Signed: 12/04/2021 2:14:30 PM By: [name] EMT Signed: 12/04/2021 4:31:59 PM By: [name] MD FACS Entered By: [name] on 12/04/2021 14:14:30

## 2021-12-04 NOTE — Progress Notes (Signed)
HANS, FREIRE ([id number]) Visit Report for 12/01/2021 HBO Details Patient Name: Date of Service: [name] [facility] 12/01/2021 10:00 A M Medical Record Number: [id number] Patient Account Number: [id number] Date of Birth/Sex: Treating RN: 07-08-1965 (56 y.o. [name] Primary Care [name]: Clinic, [name] Other Clinician: Haywood [name] Referring [name]: Treating [name]/Extender: [name] [facility], [name] in Treatment: 6 HBO Treatment Course Details Treatment Course Number: 1 Ordering Colen Eltzroth: [name] T Treatments Ordered: otal 40 HBO Treatment Start Date: 11/06/2021 HBO Indication: Diabetic Ulcer(s) of the Lower Extremity Standard/Conservative Wound Care tried and failed greater than or equal to 30 days Wound #1 Right, Medial T Great oe HBO Treatment Details Treatment Number: 17 Patient Type: Outpatient Chamber Type: Monoplace Chamber Serial #: [id number] Treatment Protocol: 2.0 ATA with 90 minutes oxygen, with two 5 minute air breaks Treatment Details Compression Rate Down: 2.0 psi / minute De-Compression Rate Up: 2.0 psi / minute A breaks and breathing ir Compress Tx Pressure periods Decompress Decompress Begins Reached (leave unused spaces Begins Ends blank) Chamber Pressure (ATA 1 2 2 2 2 2  --2 1 ) Clock Time (24 hr) 09:54 10:05 10:35 10:40 11:10 11:15 - - 11:45 11:53 Treatment Length: 119 (minutes) Treatment Segments: 4 Vital Signs Capillary Blood Glucose Reference Range: 80 - 120 mg / dl HBO Diabetic Blood Glucose Intervention Range: <131 mg/dl or >782 mg/dl Type: Time Vitals Blood Respiratory Capillary Blood Glucose Pulse Action Pulse: Temperature: Taken: Pressure: Rate: Glucose (mg/dl): Meter #: Oximetry (%) Taken: Pre 09:53 134/87 93 16 99 276 2 none per protocol Post 11:56 160/91 102 16 98.3 226 2 none per protocol Treatment Response Treatment Toleration: Well Treatment Completion Status: Treatment Completed  without Adverse Event Additional Procedure Documentation Tissue Sevierity: Fat layer exposed Physician HBO Attestation: I certify that I supervised this HBO treatment in accordance with Medicare guidelines. A trained emergency response team is readily available per Yes hospital policies and procedures. Continue HBOT as ordered. Yes Electronic Signature(s) Signed: 12/03/2021 12:16:24 AM By: Christian Corwin DO Previous Signature: 12/01/2021 7:04:05 PM Version By: [name] CHT EMT BS , , Entered By: [name] on 12/03/2021 00:16:24 -------------------------------------------------------------------------------- HBO Safety Checklist Details Patient Name: Date of Service: [name], [name] 12/01/2021 10:00 A M Medical Record Number: [id number] Patient Account Number: [id number] Date of Birth/Sex: Treating RN: [birth year] (56 y.o. [name] Primary Care [name]: Clinic, [name] Other Clinician: Haywood [name] Referring [name]: Treating [name]/Extender: [name] [facility], [name] in Treatment: 6 HBO Safety Checklist Items Safety Checklist Consent Form Signed Patient voided / foley secured and emptied When did you last eato 0845 Last dose of injectable or oral agent 0700 Ostomy pouch emptied and vented if applicable NA All implantable devices assessed, documented and approved NA Intravenous access site secured and place NA Valuables secured Linens and cotton and cotton/polyester blend (less than 51% polyester) Personal oil-based products / skin lotions / body lotions removed Wigs or hairpieces removed NA Smoking or tobacco materials removed NA Books / newspapers / magazines / loose paper removed Cologne, aftershave, perfume and deodorant removed Jewelry removed (may wrap wedding band) Make-up removed NA Hair care products removed Battery operated devices (external) removed Heating patches and chemical warmers  removed Titanium eyewear removed NA Plastic eyewear without titanium Nail polish cured greater than 10 hours NA Casting material cured greater than 10 hours NA Hearing aids removed NA Loose dentures or partials removed NA Prosthetics have been removed NA Patient demonstrates correct use of air break device (if  applicable) Patient concerns have been addressed Patient grounding bracelet on and cord attached to chamber Specifics for Inpatients (complete in addition to above) Medication sheet sent with patient NA Intravenous medications needed or due during therapy sent with patient NA Drainage tubes (e.g. nasogastric tube or chest tube secured and vented) NA Endotracheal or Tracheotomy tube secured NA [name] deflated of air and inflated with saline NA Airway suctioned NA Notes Paper version used prior to treatment. Electronic Signature(s) Signed: 12/04/2021 11:16:55 AM By: [name] CHT EMT BS , , Entered By: [name] on 12/01/2021 10:43:53

## 2021-12-04 NOTE — Progress Notes (Addendum)
Christian [name], Christian [name] ([id number]) Visit Report for 12/04/2021 HBO Details Patient Name: Date of Service: Christian [name] [facility] 12/04/2021 11:00 A M Medical Record Number: [id number] Patient Account Number: [id number] Date of Birth/Sex: Treating RN: [birth year] (56 y.o. Christian [name] Primary Care Christian [name]: Clinic, [place] Other Clinician: Karl [name] Referring Christian [name]: Treating Christian [name]/Extender: Christian [name] Clinic, [name] in Treatment: 6 HBO Treatment Course Details Treatment Course Number: 1 Ordering Christian [name]: Christian [name] T Treatments Ordered: otal 40 HBO Treatment Start Date: 11/06/2021 HBO Indication: Diabetic Ulcer(s) of the Lower Extremity Standard/Conservative Wound Care tried and failed greater than or equal to 30 days Wound #1 Right, Medial T Great oe HBO Treatment Details Treatment Number: 18 Patient Type: Outpatient Chamber Type: Monoplace Chamber Serial #: [id number] Treatment Protocol: 2.0 ATA with 90 minutes oxygen, with two 5 minute air breaks Treatment Details Compression Rate Down: 2.0 psi / minute De-Compression Rate Up: 2.0 psi / minute A breaks and breathing ir Compress Tx Pressure periods Decompress Decompress Begins Reached (leave unused spaces Begins Ends blank) Chamber Pressure (ATA 1 2 2 2 2 2  --2 1 ) Clock Time (24 hr) 11:18 11:27 11:57 12:02 12:32 12:37 - - 13:07 13:16 Treatment Length: 118 (minutes) Treatment Segments: 4 Vital Signs Capillary Blood Glucose Reference Range: 80 - 120 mg / dl HBO Diabetic Blood Glucose Intervention Range: <131 mg/dl or >606 mg/dl Time Vitals Blood Respiratory Capillary Blood Glucose Pulse Action Type: Pulse: Temperature: Taken: Pressure: Rate: Glucose (mg/dl): Meter #: Oximetry (%) Taken: Pre 11:07 122/92 92 16 97.7 286 Post 13:17 139/87 83 16 97.7 244 Treatment Response Treatment Toleration: Well Treatment Completion Status: Treatment Completed without Adverse Event Additional Procedure  Documentation Tissue Sevierity: Necrosis of bone Physician HBO Attestation: I certify that I supervised this HBO treatment in accordance with Medicare guidelines. A trained emergency response team is readily available per Yes hospital policies and procedures. Continue HBOT as ordered. Yes Electronic Signature(s) Signed: 12/04/2021 4:31:06 PM By: Christian Guess MD FACS Previous Signature: 12/04/2021 2:14:05 PM Version By: Christian [name] EMT Entered By: Christian [name] on 12/04/2021 16:31:05 -------------------------------------------------------------------------------- HBO Safety Checklist Details Patient Name: Date of Service: Christian [name] RD, Christian [name] 12/04/2021 11:00 A M Medical Record Number: [id number] Patient Account Number: [id number] Date of Birth/Sex: Treating RN: 1966/05/20 (56 y.o. Christian [name] Primary Care Christian [name]: Clinic, [place] [name] Other Clinician: Karl [name] Referring Christian [name]: Treating Christian [name]/Extender: Christian [name] Clinic, [name] in Treatment: 6 HBO Safety Checklist Items Safety Checklist Consent Form Signed Patient voided / foley secured and emptied When did you last eato 0300 Last dose of injectable or oral agent 0915 Ostomy pouch emptied and vented if applicable NA All implantable devices assessed, documented and approved NA Intravenous access site secured and place NA Valuables secured Linens and cotton and cotton/polyester blend (less than 51% polyester) Personal oil-based products / skin lotions / body lotions removed Wigs or hairpieces removed NA Smoking or tobacco materials removed Books / newspapers / magazines / loose paper removed Cologne, aftershave, perfume and deodorant removed Jewelry removed (may wrap wedding band) NA Make-up removed NA Hair care products removed Battery operated devices (external) removed Heating patches and chemical warmers removed Titanium eyewear removed NA Frames approver by [employer] Nail  polish cured greater than 10 hours NA Casting material cured greater than 10 hours NA Hearing aids removed NA Loose dentures or partials removed NA Prosthetics have been removed NA Patient demonstrates correct use of air break device (if applicable) Patient concerns have been addressed Patient grounding bracelet on  and cord attached to chamber Specifics for Inpatients (complete in addition to above) Medication sheet sent with patient NA Intravenous medications needed or due during therapy sent with patient NA Drainage tubes (e.g. nasogastric tube or chest tube secured and vented) NA Endotracheal or Tracheotomy tube secured NA [name] deflated of air and inflated with saline NA Airway suctioned NA Notes Safety checklist was done before treatment started. Electronic Signature(s) Signed: 12/04/2021 2:12:20 PM By: Christian [name] EMT Entered By: Christian [name] on 12/04/2021 14:12:20

## 2021-12-04 NOTE — Progress Notes (Signed)
[name], Christian ([id number]) Visit Report for 11/30/2021 HBO Details Patient Name: Date of Service: [name] [facility] 11/30/2021 10:15 A M Medical Record Number: [id number] Patient Account Number: [id number] Date of Birth/Sex: Treating RN: [birth year] (56 y.o. [name] Primary Care Carlinda Ohlson: Clinic, [name] Other Clinician: [name] Referring Rayshad Riviello: Treating Deryk Bozman/Extender: [name] Clinic, [name] in Treatment: 6 HBO Treatment Course Details Treatment Course Number: 1 Ordering Djuan Talton: [name] T Treatments Ordered: otal 40 HBO Treatment Start Date: 11/06/2021 HBO Indication: Diabetic Ulcer(s) of the Lower Extremity Standard/Conservative Wound Care tried and failed greater than or equal to 30 days Wound #1 Right, Medial T Great oe HBO Treatment Details Treatment Number: 16 Patient Type: Outpatient Chamber Type: Monoplace Chamber Serial #: [id number] Treatment Protocol: 2.0 ATA with 90 minutes oxygen, with two 5 minute air breaks Treatment Details Compression Rate Down: 2.0 psi / minute De-Compression Rate Up: 2.0 psi / minute A breaks and breathing ir Compress Tx Pressure periods Decompress Decompress Begins Reached (leave unused spaces Begins Ends blank) Chamber Pressure (ATA 1 2 2 2 2 2  --2 1 ) Clock Time (24 hr) 10:12 10:23 10:52 10:57 11:27 11:32 - - 12:02 12:11 Treatment Length: 119 (minutes) Treatment Segments: 4 Vital Signs Capillary Blood Glucose Reference Range: 80 - 120 mg / dl HBO Diabetic Blood Glucose Intervention Range: <131 mg/dl or >130 mg/dl Type: Time Vitals Blood Pulse: Respiratory Temperature: Capillary Blood Glucose Pulse Action Taken: Pressure: Rate: Glucose (mg/dl): Meter #: Oximetry (%) Taken: Pre 09:40 159/87 105 16 98.8 436 2 Alerted physician glucose level per protocol. Post 12:12 145/91 98 16 98.9 269 Alerted physician glucose level per protocol. Treatment Response Treatment Completion Status:  Treatment Completed without Adverse Event Treatment Notes Dr. Lady Gary informed of the patient blood sugar. Additional Procedure Documentation Tissue Sevierity: Fat layer exposed Physician HBO Attestation: I certify that I supervised this HBO treatment in accordance with Medicare guidelines. A trained emergency response team is readily available per Yes hospital policies and procedures. Continue HBOT as ordered. Yes Electronic Signature(s) Signed: 11/30/2021 1:53:01 PM By: [name] MD FACS Previous Signature: 11/30/2021 1:06:49 PM Version By: [name] EMT Previous Signature: 11/30/2021 12:59:14 PM Version By: [name] EMT Entered By: [name] on 11/30/2021 13:53:01 -------------------------------------------------------------------------------- HBO Safety Checklist Details Patient Name: Date of Service: [name], [name] 11/30/2021 10:15 A M Medical Record Number: [id number] Patient Account Number: [id number] Date of Birth/Sex: Treating RN: May 29, 1966 (56 y.o. [name] Primary Care Kasyn Rolph: [facility], [name] Other Clinician: [name] Referring Tyshea Imel: Treating Afia Messenger/Extender: [name] [facility], [name] in Treatment: 6 HBO Safety Checklist Items Safety Checklist Consent Form Signed Patient voided / foley secured and emptied When did you last eato 0600 Last dose of injectable or oral agent 0600 Ostomy pouch emptied and vented if applicable NA All implantable devices assessed, documented and approved NA Intravenous access site secured and place NA Valuables secured Linens and cotton and cotton/polyester blend (less than 51% polyester) Personal oil-based products / skin lotions / body lotions removed Wigs or hairpieces removed NA Smoking or tobacco materials removed NA Books / newspapers / magazines / loose paper removed Cologne, aftershave, perfume and deodorant removed Jewelry removed (may wrap wedding  band) Make-up removed NA Hair care products removed Battery operated devices (external) removed Heating patches and chemical warmers removed Titanium eyewear removed NA Plastic eyewear without titanium Nail polish cured greater than 10 hours NA Casting material cured greater than 10 hours NA Hearing aids removed NA Loose  dentures or partials removed NA Prosthetics have been removed NA Patient demonstrates correct use of air break device (if applicable) Patient concerns have been addressed Patient grounding bracelet on and cord attached to chamber Specifics for Inpatients (complete in addition to above) Medication sheet sent with patient NA Intravenous medications needed or due during therapy sent with patient NA Drainage tubes (e.g. nasogastric tube or chest tube secured and vented) NA Endotracheal or Tracheotomy tube secured NA [name] deflated of air and inflated with saline NA Airway suctioned NA Notes Paper version used prior to treatment. Electronic Signature(s) Signed: 12/04/2021 11:16:55 AM By: [name] CHT EMT BS , , Entered By: [name] on 11/30/2021 10:46:07

## 2021-12-04 NOTE — Progress Notes (Signed)
Christian, [name] ([id number]) Visit Report for 12/04/2021 Arrival Information Details Patient Name: Date of Service: [name] [facility] 12/04/2021 11:00 A M Medical Record Number: [id number] Patient Account Number: [id number] Date of Birth/Sex: Treating RN: 09/07/1965 (56 y.o. Tammy Sours Primary Care Min Collymore: Clinic, [name] Other Clinician: [name] Referring Colby Reels: Treating Ragan Duhon/Extender: [name] Clinic, [name] in Treatment: 6 Visit Information History Since Last Visit All ordered tests and consults were completed: Yes Patient Arrived: Ambulatory Added or deleted any medications: No Arrival Time: 10:48 Any new allergies or adverse reactions: No Accompanied By: None Had a fall or experienced change in No Transfer Assistance: None activities of daily living that may affect Patient Identification Verified: Yes risk of falls: Secondary Verification Process Completed: Yes Signs or symptoms of abuse/neglect since last visito No Patient Requires Transmission-Based Precautions: No Hospitalized since last visit: No Patient Has Alerts: No Implantable device outside of the clinic excluding No cellular tissue based products placed in the center since last visit: Pain Present Now: No Electronic Signature(s) Signed: 12/04/2021 2:10:29 PM By: [name] EMT Entered By: [name] on 12/04/2021 14:10:29 -------------------------------------------------------------------------------- Vitals Details Patient Name: Date of Service: [name] 12/04/2021 11:00 A M Medical Record Number: [id number] Patient Account Number: [id number] Date of Birth/Sex: Treating RN: 09/04/1965 (56 y.o. Tammy Sours Primary Care Clairessa Boulet: Clinic, [place] Other Clinician: [name] Referring Otis Portal: Treating Diyan Dave/Extender: [name] Clinic, [name] in Treatment: 6 Vital Signs Time Taken: 11:07 Temperature (F):  97.7 Height (in): 69 Pulse (bpm): 92 Weight (lbs): 180 Respiratory Rate (breaths/min): 16 Body Mass Index (BMI): 26.6 Blood Pressure (mmHg): 122/92 Capillary Blood Glucose (mg/dl): 425 Reference Range: 80 - 120 mg / dl Electronic Signature(s) Signed: 12/04/2021 2:11:01 PM By: [name] EMT Entered By: [name] on 12/04/2021 14:11:01

## 2021-12-04 NOTE — Progress Notes (Signed)
[name], [name] ([id number]) Visit Report for 11/30/2021 Arrival Information Details Patient Name: Date of Service: [name] [facility] 11/30/2021 10:15 A M Medical Record Number: [id number] Patient Account Number: [id number] Date of Birth/Sex: Treating RN: [birth year] (56 y.o. [name] Primary Care Jacari Iannello: [facility], [name] Other Clinician: [name] Referring Shawnn Bouillon: Treating Edenilson Austad/Extender: [name] [facility], [name] in Treatment: 6 Visit Information History Since Last Visit All ordered tests and consults were completed: Yes Patient Arrived: Ambulatory Added or deleted any medications: No Arrival Time: 09:58 Any new allergies or adverse reactions: No Accompanied By: self Had a fall or experienced change in No Transfer Assistance: None activities of daily living that may affect Patient Identification Verified: Yes risk of falls: Secondary Verification Process Completed: Yes Signs or symptoms of abuse/neglect since last visito No Patient Requires Transmission-Based Precautions: No Hospitalized since last visit: No Patient Has Alerts: No Implantable device outside of the clinic excluding No cellular tissue based products placed in the center since last visit: Pain Present Now: No Electronic Signature(s) Signed: 12/04/2021 11:16:55 AM By: [name] CHT EMT BS , , Entered By: [name] on 11/30/2021 10:43:41 -------------------------------------------------------------------------------- Encounter Discharge Information Details Patient Name: Date of Service: [name], [name] [name] 11/30/2021 10:15 A M Medical Record Number: [id number] Patient Account Number: [id number] Date of Birth/Sex: Treating RN: 1965-12-11 (56 y.o. [name] Primary Care Tiesha Marich: [facility], [name] Other Clinician: [name] Referring Praneel Haisley: Treating Matisyn Cabeza/Extender: [name] [facility], [name] in Treatment: 6 Encounter  Discharge Information Items Discharge Condition: Stable Ambulatory Status: Ambulatory Discharge Destination: Home Transportation: Private Auto Accompanied By: None Schedule Follow-up Appointment: Yes Clinical Summary of Care: Electronic Signature(s) Signed: 11/30/2021 1:01:18 PM By: [name] EMT Entered By: [name] on 11/30/2021 13:01:17 -------------------------------------------------------------------------------- Vitals Details Patient Name: Date of Service: [name], [name] 11/30/2021 10:15 A M Medical Record Number: [id number] Patient Account Number: [id number] Date of Birth/Sex: Treating RN: December 03, 1965 (56 y.o. [name] Primary Care Kregg Cihlar: [facility], [name] Other Clinician: [name] Referring Kennard Fildes: Treating Markcus Lazenby/Extender: [name] [facility], [name] in Treatment: 6 Vital Signs Time Taken: 09:40 Temperature (F): 98.8 Height (in): 69 Pulse (bpm): 105 Weight (lbs): 180 Respiratory Rate (breaths/min): 16 Body Mass Index (BMI): 26.6 Blood Pressure (mmHg): 159/87 Capillary Blood Glucose (mg/dl): 557 Reference Range: 80 - 120 mg / dl Electronic Signature(s) Signed: 12/04/2021 11:16:55 AM By: [name] CHT EMT BS , , Entered By: [name] on 11/30/2021 10:45:01

## 2021-12-05 ENCOUNTER — Ambulatory Visit (HOSPITAL_COMMUNITY)
Admission: RE | Admit: 2021-12-05 | Discharge: 2021-12-05 | Disposition: A | Discharge status: Home / Self Care | Payer: No Typology Code available for payment source | Source: Ambulatory Visit | Attending: Internal Medicine | Admitting: Internal Medicine

## 2021-12-05 ENCOUNTER — Encounter (HOSPITAL_BASED_OUTPATIENT_CLINIC_OR_DEPARTMENT_OTHER): Payer: No Typology Code available for payment source | Admitting: General Surgery

## 2021-12-05 ENCOUNTER — Other Ambulatory Visit (HOSPITAL_COMMUNITY): Payer: Self-pay | Admitting: Internal Medicine

## 2021-12-05 DIAGNOSIS — L97514 Non-pressure chronic ulcer of other part of right foot with necrosis of bone: Secondary | ICD-10-CM | POA: Insufficient documentation

## 2021-12-05 DIAGNOSIS — E11621 Type 2 diabetes mellitus with foot ulcer: Secondary | ICD-10-CM | POA: Diagnosis not present

## 2021-12-05 LAB — BASIC METABOLIC PANEL WITH GFR
BUN: 12 mg/dL (ref 7–25)
CO2: 29 mmol/L (ref 20–32)
Calcium: 9.3 mg/dL (ref 8.6–10.3)
Chloride: 99 mmol/L (ref 98–110)
Creat: 0.94 mg/dL (ref 0.70–1.30)
Glucose, Bld: 313 mg/dL — ABNORMAL HIGH (ref 65–99)
Potassium: 4.3 mmol/L (ref 3.5–5.3)
Sodium: 134 mmol/L — ABNORMAL LOW (ref 135–146)
eGFR: 96 mL/min/{1.73_m2} (ref >=60)

## 2021-12-05 LAB — CBC WITH DIFFERENTIAL/PLATELET
Absolute Monocytes: 449 cells/uL (ref 200–950)
Basophils Absolute: 41 cells/uL (ref 0–200)
Basophils Relative: 0.8 %
Eosinophils Absolute: 117 cells/uL (ref 15–500)
Eosinophils Relative: 2.3 %
HCT: 47.3 % (ref 38.5–50.0)
Hemoglobin: 15.9 g/dL (ref 13.2–17.1)
Lymphs Abs: 1897 cells/uL (ref 850–3900)
MCH: 31.3 pg (ref 27.0–33.0)
MCHC: 33.6 g/dL (ref 32.0–36.0)
MCV: 93.1 fL (ref 80.0–100.0)
MPV: 9.1 fL (ref 7.5–12.5)
Monocytes Relative: 8.8 %
Neutro Abs: 2596 cells/uL (ref 1500–7800)
Neutrophils Relative %: 50.9 %
Platelets: 262 10*3/uL (ref 140–400)
RBC: 5.08 10*6/uL (ref 4.20–5.80)
RDW: 13 % (ref 11.0–15.0)
Total Lymphocyte: 37.2 %
WBC: 5.1 10*3/uL (ref 3.8–10.8)

## 2021-12-05 LAB — GLUCOSE, CAPILLARY
Glucose-Capillary: 264 mg/dL — ABNORMAL HIGH (ref 70–99)
Glucose-Capillary: 240 mg/dL — ABNORMAL HIGH (ref 70–99)

## 2021-12-05 LAB — C-REACTIVE PROTEIN: CRP: 13.1 mg/L — ABNORMAL HIGH (ref <8.0)

## 2021-12-05 LAB — SEDIMENTATION RATE: Sed Rate: 6 mm/h (ref 0–20)

## 2021-12-05 NOTE — Progress Notes (Signed)
WINDSOR, FAZ ([id number]) Visit Report for 12/05/2021 Arrival Information Details Patient Name: Date of Service: [name] [facility] 12/05/2021 10:00 [place] Record Number: [id number] Patient Account Number: [id number] Date of Birth/Sex: Treating RN: 14-Oct-1965 (56 y.o. [name] Primary Care Kearney Evitt: Clinic, [name] Other Clinician: Valeria [name] Referring [name] : Treating [name]/Extender: [name] [facility], [name] in Treatment: 6 Visit Information History Since Last Visit All ordered tests and consults were completed: Yes Patient Arrived: Ambulatory Added or deleted any medications: No Arrival Time: 10:55 Any new allergies or adverse reactions: No Accompanied By: None Had a fall or experienced change in No Transfer Assistance: None activities of daily living that may affect Patient Identification Verified: Yes risk of falls: Secondary Verification Process Completed: Yes Signs or symptoms of abuse/neglect since last visito No Patient Requires Transmission-Based Precautions: No Hospitalized since last visit: No Patient Has Alerts: No Implantable device outside of the clinic excluding No cellular tissue based products placed in the center since last visit: Pain Present Now: No Electronic Signature(s) Signed: 12/05/2021 4:20:01 PM By: [name] EMT Entered By: [name] on 12/05/2021 16:20:00 -------------------------------------------------------------------------------- Encounter Discharge Information Details Patient Name: Date of Service: [name] RD, [place] [facility] 12/05/2021 10:00 [place] Record Number: [id number] Patient Account Number: [id number] Date of Birth/Sex: Treating RN: 12-05-1965 (56 y.o. [name] Primary Care Imanol Bihl: [facility], [name] Other Clinician: Valeria [name] Referring [name]: Treating [name]/Extender: [name] [facility], [name] in Treatment: 6 Encounter Discharge Information  Items Discharge Condition: Stable Ambulatory Status: Ambulatory Discharge Destination: Home Transportation: Private Auto Accompanied By: None Schedule Follow-up Appointment: Yes Clinical Summary of Care: Electronic Signature(s) Signed: 12/05/2021 4:27:47 PM By: [name] EMT Entered By: [name] on 12/05/2021 16:27:47 -------------------------------------------------------------------------------- [place] Details Patient Name: Date of Service: [name] RD, [place] 12/05/2021 10:00 [place] Record Number: [id number] Patient Account Number: [id number] Date of Birth/Sex: Treating RN: 12-05-1965 (56 y.o. [name] Primary Care Brittanie Dosanjh: [facility], [name] Other Clinician: Valeria [name] Referring [name]: Treating [name]/Extender: [name] [facility], [name] in Treatment: 6 Vital Signs Time Taken: 11:00 Temperature (F): 98.1 Height (in): 69 Pulse (bpm): 93 Weight (lbs): 180 Respiratory Rate (breaths/min): 16 Body Mass Index (BMI): 26.6 Blood Pressure (mmHg): 136/86 Capillary Blood Glucose (mg/dl): 264 Reference Range: 80 - 120 mg / dl Electronic Signature(s) Signed: 12/05/2021 4:20:37 PM By: [name] EMT Entered By: [name] on 12/05/2021 16:20:36

## 2021-12-05 NOTE — Progress Notes (Signed)
FAELAN, MOSCATELLI ([id number]) Visit Report for 12/05/2021 HBO Details Patient Name: Date of Service: [name] [facility] 12/05/2021 10:00 A M Medical Record Number: [id number] Patient Account Number: [id number] Date of Birth/Sex: Treating RN: [birth year] (56 y.o. [name] Primary Care Jeanean Hollett: [name] Other Clinician: [name] Referring Eraina Winnie: Treating Kayson Tasker/Extender: [name] [facility], [name] in Treatment: 6 HBO Treatment Course Details Treatment Course Number: 1 Ordering Devanie Galanti: [name] T Treatments Ordered: otal 40 HBO Treatment Start Date: 11/06/2021 HBO Indication: Diabetic Ulcer(s) of the Lower Extremity Standard/Conservative Wound Care tried and failed greater than or equal to 30 days Wound #1 Right, Medial T Great oe HBO Treatment Details Treatment Number: 19 Patient Type: Outpatient Chamber Type: Monoplace Chamber Serial #: [id number] Treatment Protocol: 2.0 ATA with 90 minutes oxygen, with two 5 minute air breaks Treatment Details Compression Rate Down: 2.0 psi / minute De-Compression Rate Up: 2.0 psi / minute A breaks and ir breathing Compress Tx Pressure Decompress Decompress periods Begins Reached Begins Ends (leave unused spaces blank) Chamber Pressure (ATA 1 2 2 2  22--2 1 ) Clock Time (24 hr) 11:04 11:12 11:42 11:47 - - - - 12:06 12:13 Treatment Length: 69 (minutes) Treatment Segments: 2 Vital Signs Capillary Blood Glucose Reference Range: 80 - 120 mg / dl HBO Diabetic Blood Glucose Intervention Range: <131 mg/dl or >010 mg/dl Time Vitals Blood Respiratory Capillary Blood Glucose Pulse Action Type: Pulse: Temperature: Taken: Pressure: Rate: Glucose (mg/dl): Meter #: Oximetry (%) Taken: Pre 11:00 136/86 93 16 98.1 264 Post 12:16 135/86 90 16 98.1 240 Treatment Response Treatment Toleration: Well Treatment Completion Status: Treatment Aborted/Not Restarted Reason: Patient Choice Treatment  Notes The patient asked to have his treatment stopped early today due to him having a doctor's appointment today at 1300. Dr. Lady Gary informed of same. Additional Procedure Documentation Tissue Sevierity: Necrosis of bone Physician HBO Attestation: I certify that I supervised this HBO treatment in accordance with Medicare guidelines. A trained emergency response team is readily available per Yes hospital policies and procedures. Continue HBOT as ordered. Yes Electronic Signature(s) Signed: 12/05/2021 4:59:46 PM By: [name] MD FACS Previous Signature: 12/05/2021 4:24:36 PM Version By: [name] EMT Previous Signature: 12/05/2021 4:24:36 PM Version By: [name] EMT Entered By: [name] on 12/05/2021 16:59:46 -------------------------------------------------------------------------------- HBO Safety Checklist Details Patient Name: Date of Service: [name] 12/05/2021 10:00 A M Medical Record Number: [id number] Patient Account Number: [id number] Date of Birth/Sex: Treating RN: [birth year] (56 y.o. [name] Primary Care Maddox Hlavaty: [name] Other Clinician: [name] Referring Poetry Cerro: Treating Zaryah Seckel/Extender: [name], [name] in Treatment: 6 HBO Safety Checklist Items Safety Checklist Consent Form Signed Patient voided / foley secured and emptied When did you last eato 1000 Last dose of injectable or oral agent 0900 Ostomy pouch emptied and vented if applicable NA All implantable devices assessed, documented and approved NA Intravenous access site secured and place NA Valuables secured Linens and cotton and cotton/polyester blend (less than 51% polyester) Personal oil-based products / skin lotions / body lotions removed Wigs or hairpieces removed NA Smoking or tobacco materials removed Books / newspapers / magazines / loose paper removed Cologne, aftershave, perfume and deodorant  removed Jewelry removed (may wrap wedding band) NA Make-up removed NA Hair care products removed Battery operated devices (external) removed Heating patches and chemical warmers removed Titanium eyewear removed NA Frames approver by [employer] Nail polish cured greater than 10 hours NA Casting material cured greater than 10  hours NA Hearing aids removed NA Loose dentures or partials removed NA Prosthetics have been removed NA Patient demonstrates correct use of air break device (if applicable) Patient concerns have been addressed Patient grounding bracelet on and cord attached to chamber Specifics for Inpatients (complete in addition to above) Medication sheet sent with patient NA Intravenous medications needed or due during therapy sent with patient NA Drainage tubes (e.g. nasogastric tube or chest tube secured and vented) NA Endotracheal or Tracheotomy tube secured NA [name] deflated of air and inflated with saline NA Airway suctioned NA Electronic Signature(s) Signed: 12/05/2021 4:21:31 PM By: [name] EMT Entered By: [name] on 12/05/2021 16:21:30

## 2021-12-05 NOTE — Progress Notes (Addendum)
DUANNE, DUCHESNE ([id number]) Visit Report for 12/05/2021 Physician Orders Details Patient Name: Date of Service: [name] [facility] 12/05/2021 10:00 A M Medical Record Number: [id number] Patient Account Number: [id number] Date of Birth/Sex: Treating RN: 04-18-1966 (56 y.o. M) Primary Care Provider: [name] Other Clinician: Referring Provider: Treating Provider/Extender: [name] [facility], [name] in Treatment: 6 Verbal / Phone Orders: No Diagnosis Coding Hyperbaric Oxygen Therapy Other - 12/05/2021 only: 2.0 ATA for 60 minutes, 1 five minute air break (due to conflicting appointment) Wound Treatment Electronic Signature(s) Signed: 12/05/2021 10:49:27 AM By: [name] MD FACS Entered By: [name] on 12/05/2021 10:49:27 -------------------------------------------------------------------------------- Problem List Details Patient Name: Date of Service: [name], [name] 12/05/2021 10:00 A M Medical Record Number: [id number] Patient Account Number: [id number] Date of Birth/Sex: Treating RN: [birth year] (56 y.o. 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, right ankle and foot Facility Procedures CPT4 Code: [id number] Description: G0277-(Facility Use Only) HBOT full body chamber, , ICD-10 Diagnosis Description E11.621 Type 2 diabetes mellitus with foot ulcer L97.514 Non-pressure chronic ulcer of other part of right foot with necrosis of bone M86.071 Acute  hematogenous osteomyelitis, right ankle and foot Modifier: Quantity: 2 Physician Procedures : CPT4 Code Description Modifier [id number] - WC PHYS HYPERBARIC OXYGEN THERAPY ICD-10 Diagnosis Description E11.621 Type 2 diabetes mellitus with foot ulcer L97.514 Non-pressure chronic ulcer of other part of right foot with necrosis of bone M86.071  Acute hematogenous osteomyelitis, right ankle and foot Quantity: 1 Electronic Signature(s) Signed: 12/05/2021 4:27:00 PM By: [name] EMT Signed: 12/05/2021 4:58:27 PM By: [name] MD FACS Entered By: [name] on 12/05/2021 16:26:59

## 2021-12-06 ENCOUNTER — Encounter (HOSPITAL_BASED_OUTPATIENT_CLINIC_OR_DEPARTMENT_OTHER): Payer: No Typology Code available for payment source | Admitting: General Surgery

## 2021-12-06 DIAGNOSIS — E11621 Type 2 diabetes mellitus with foot ulcer: Secondary | ICD-10-CM | POA: Diagnosis not present

## 2021-12-06 LAB — GLUCOSE, CAPILLARY
Glucose-Capillary: 227 mg/dL — ABNORMAL HIGH (ref 70–99)
Glucose-Capillary: 169 mg/dL — ABNORMAL HIGH (ref 70–99)

## 2021-12-06 NOTE — Progress Notes (Signed)
DOYEL, TALBOT ([id number]) Visit Report for 12/06/2021 HBO Details Patient Name: Date of Service: [name] [facility] 12/06/2021 10:00 A M Medical Record Number: [id number] Patient Account Number: [id number] Date of Birth/Sex: Treating RN: 12/25/1965 (56 y.o. [name] Primary Care Shahram Alexopoulos: [name] Other Clinician: [name] Referring Falon Flinchum: Treating Krishauna Schatzman/Extender: [name] [name] in Treatment: 6 HBO Treatment Course Details Treatment Course Number: 1 Ordering Kevon Tench: [name] T Treatments Ordered: otal 40 HBO Treatment Start Date: 11/06/2021 HBO Indication: Diabetic Ulcer(s) of the Lower Extremity Standard/Conservative Wound Care tried and failed greater than or equal to 30 days Wound #1 Right, Medial T Great oe HBO Treatment Details Treatment Number: 20 Patient Type: Outpatient Chamber Type: Monoplace Chamber Serial #: [id number] Treatment Protocol: 2.0 ATA with 90 minutes oxygen, with two 5 minute air breaks Treatment Details Compression Rate Down: 2.0 psi / minute De-Compression Rate Up: 2.0 psi / minute A breaks and breathing ir Compress Tx Pressure periods Decompress Decompress Begins Reached (leave unused spaces Begins Ends blank) Chamber Pressure (ATA 1 2 2 2 2 2  --2 1 ) Clock Time (24 hr) 10:16 10:25 10:55 11:00 11:30 11:35 - - 12:05 12:12 Treatment Length: 116 (minutes) Treatment Segments: 4 Vital Signs Capillary Blood Glucose Reference Range: 80 - 120 mg / dl HBO Diabetic Blood Glucose Intervention Range: <131 mg/dl or >643 mg/dl Time Vitals Blood Respiratory Capillary Blood Glucose Pulse Action Type: Pulse: Temperature: Taken: Pressure: Rate: Glucose (mg/dl): Meter #: Oximetry (%) Taken: Pre 10:13 119/78 90 18 97.5 227 Post 12:18 134/88 90 16 98.1 169 Treatment Response Treatment Toleration: Well Treatment Completion Status: Treatment Completed without Adverse Event Additional Procedure  Documentation Tissue Sevierity: Necrosis of bone Physician HBO Attestation: I certify that I supervised this HBO treatment in accordance with Medicare guidelines. A trained emergency response team is readily available per Yes hospital policies and procedures. Continue HBOT as ordered. Yes Electronic Signature(s) Signed: 12/06/2021 4:36:24 PM By: [name] MD FACS Previous Signature: 12/06/2021 4:23:55 PM Version By: [name] EMT Entered By: [name] on 12/06/2021 16:36:23 -------------------------------------------------------------------------------- HBO Safety Checklist Details Patient Name: Date of Service: [name] RD, [name] 12/06/2021 10:00 A M Medical Record Number: [id number] Patient Account Number: [id number] Date of Birth/Sex: Treating RN: Apr 22, 1966 (56 y.o. [name] Primary Care Nakaila Freeze: [name] Other Clinician: [name] Referring Aviyah Swetz: Treating Graylin Sperling/Extender: [name] [name] in Treatment: 6 HBO Safety Checklist Items Safety Checklist Consent Form Signed Patient voided / foley secured and emptied When did you last eato 1000 Last dose of injectable or oral agent 0900 Ostomy pouch emptied and vented if applicable NA All implantable devices assessed, documented and approved NA Intravenous access site secured and place NA Valuables secured Linens and cotton and cotton/polyester blend (less than 51% polyester) Personal oil-based products / skin lotions / body lotions removed Wigs or hairpieces removed NA Smoking or tobacco materials removed Books / newspapers / magazines / loose paper removed Cologne, aftershave, perfume and deodorant removed Jewelry removed (may wrap wedding band) NA Make-up removed NA Hair care products removed Battery operated devices (external) removed Heating patches and chemical warmers removed Titanium eyewear removed NA Nail polish cured greater than 10  hours NA Casting material cured greater than 10 hours NA Hearing aids removed NA Loose dentures or partials removed NA Prosthetics have been removed NA Patient demonstrates correct use of air break device (if applicable) Patient concerns have been addressed Patient grounding bracelet on and cord attached to chamber  Specifics for Inpatients (complete in addition to above) Medication sheet sent with patient NA Intravenous medications needed or due during therapy sent with patient NA Drainage tubes (e.g. nasogastric tube or chest tube secured and vented) NA Endotracheal or Tracheotomy tube secured NA Cuff deflated of air and inflated with saline NA Airway suctioned NA Notes safety checklist was done before treatment started. Electronic Signature(s) Signed: 12/06/2021 4:24:42 PM By: [name] EMT Previous Signature: 12/06/2021 4:22:33 PM Version By: [name] EMT Entered By: [name] on 12/06/2021 16:24:42

## 2021-12-06 NOTE — Progress Notes (Signed)
EDGE, MAUGER ([id number]) Visit Report for 12/06/2021 Problem List Details Patient Name: Date of Service: [name] [facility] 12/06/2021 10:00 A M Medical Record Number: [id number] Patient Account Number: [id number] Date of Birth/Sex: Treating RN: [birth year] (56 y.o. 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, right ankle and foot Facility Procedures CPT4 Code: [id number] Description: G0277-(Facility Use Only) HBOT full body chamber, , ICD-10 Diagnosis Description E11.621 Type 2 diabetes mellitus  with foot ulcer L97.514 Non-pressure chronic ulcer of other part of right foot with necrosis of bone M86.071 Acute  hematogenous osteomyelitis, right ankle and foot Modifier: Quantity: 4 Physician Procedures : CPT4 Code Description Modifier [id number] - WC PHYS HYPERBARIC OXYGEN THERAPY ICD-10 Diagnosis Description E11.621 Type 2 diabetes mellitus with foot ulcer L97.514 Non-pressure chronic ulcer of other part of right foot with necrosis of bone M86.071  Acute hematogenous osteomyelitis, right ankle and foot Quantity: 1 Electronic Signature(s) Signed: 12/06/2021 4:25:18 PM By: [name] EMT Signed: 12/06/2021 4:37:34 PM By: [name] MD FACS Entered By: [name] on 12/06/2021 16:25:17

## 2021-12-06 NOTE — Progress Notes (Signed)
[name], [name] ([id number]) Visit Report for 12/06/2021 Arrival Information Details Patient Name: Date of Service: [name] [facility] 12/06/2021 10:00 A M Medical Record Number: [id number] Patient Account Number: [id number] Date of Birth/Sex: Treating RN: [birth year] (56 y.o. [name] Primary Care Nusaiba Guallpa: [name] Other Clinician: [name] Referring Yoltzin Ransom: Treating Trayven Lumadue/Extender: [name] [name] in Treatment: 6 Visit Information History Since Last Visit All ordered tests and consults were completed: Yes Patient Arrived: Ambulatory Added or deleted any medications: No Arrival Time: 10:00 Any new allergies or adverse reactions: No Accompanied By: None Had a fall or experienced change in No Transfer Assistance: None activities of daily living that may affect Patient Identification Verified: Yes risk of falls: Secondary Verification Process Completed: Yes Signs or symptoms of abuse/neglect since last visito No Patient Requires Transmission-Based Precautions: No Hospitalized since last visit: No Patient Has Alerts: No Implantable device outside of the clinic excluding No cellular tissue based products placed in the center since last visit: Pain Present Now: No Electronic Signature(s) Signed: 12/06/2021 4:19:54 PM By: [name] EMT Entered By: [name] on 12/06/2021 16:19:54 -------------------------------------------------------------------------------- Encounter Discharge Information Details Patient Name: Date of Service: [name], [name] 12/06/2021 10:00 A M Medical Record Number: [id number] Patient Account Number: [id number] Date of Birth/Sex: Treating RN: [birth year] (56 y.o. [name] Primary Care Lauralie Blacksher: [name] Other Clinician: [name] Referring Tian Mcmurtrey: Treating Jabaree Mercado/Extender: [name] [name] in Treatment: 6 Encounter Discharge Information  Items Discharge Condition: Stable Ambulatory Status: Ambulatory Discharge Destination: Home Transportation: Private Auto Accompanied By: None Schedule Follow-up Appointment: Yes Clinical Summary of Care: Electronic Signature(s) Signed: 12/06/2021 4:26:03 PM By: [name] EMT Entered By: [name] on 12/06/2021 16:26:03 -------------------------------------------------------------------------------- Vitals Details Patient Name: Date of Service: [name], [name] 12/06/2021 10:00 A M Medical Record Number: [id number] Patient Account Number: [id number] Date of Birth/Sex: Treating RN: [name], [name] (56 y.o. [name] Primary Care Beauden Tremont: [name] Other Clinician: [name] Referring Clara Smolen: Treating Eustace Hur/Extender: [name] [name] in Treatment: 6 Vital Signs Time Taken: 10:13 Temperature (F): 97.5 Height (in): 69 Pulse (bpm): 90 Weight (lbs): 180 Respiratory Rate (breaths/min): 18 Body Mass Index (BMI): 26.6 Blood Pressure (mmHg): 119/78 Capillary Blood Glucose (mg/dl): 244 Reference Range: 80 - 120 mg / dl Electronic Signature(s) Signed: 12/06/2021 4:20:30 PM By: [name] EMT Entered By: [name] on 12/06/2021 16:20:30

## 2021-12-07 ENCOUNTER — Encounter (HOSPITAL_BASED_OUTPATIENT_CLINIC_OR_DEPARTMENT_OTHER): Payer: No Typology Code available for payment source | Admitting: General Surgery

## 2021-12-07 ENCOUNTER — Encounter (HOSPITAL_BASED_OUTPATIENT_CLINIC_OR_DEPARTMENT_OTHER): Payer: No Typology Code available for payment source | Admitting: Internal Medicine

## 2021-12-07 DIAGNOSIS — E11621 Type 2 diabetes mellitus with foot ulcer: Secondary | ICD-10-CM | POA: Diagnosis not present

## 2021-12-07 LAB — GLUCOSE, CAPILLARY: Glucose-Capillary: 148 mg/dL — ABNORMAL HIGH (ref 70–99)

## 2021-12-07 NOTE — Progress Notes (Addendum)
ESMERALDA, BLANFORD ([id number]) Visit Report for 12/07/2021 Arrival Information Details Patient Name: Date of Service: [name] [facility] 12/07/2021 10:30 A M Medical Record Number: [id number] Patient Account Number: [id number] Date of Birth/Sex: Treating RN: 05/03/1966 (56 y.o. [name] Primary Care Cevin Rubinstein: [name] Other Clinician: [name] Referring Loneta Tamplin: Treating Cameo Schmiesing/Extender: [name] [name] in Treatment: 7 Visit Information History Since Last Visit All ordered tests and consults were completed: Yes Patient Arrived: Ambulatory Added or deleted any medications: No Arrival Time: 09:52 Any new allergies or adverse reactions: No Accompanied By: None Had a fall or experienced change in No Transfer Assistance: None activities of daily living that may affect Patient Identification Verified: Yes risk of falls: Secondary Verification Process Completed: Yes Signs or symptoms of abuse/neglect since last visito No Patient Requires Transmission-Based Precautions: No Hospitalized since last visit: No Patient Has Alerts: No Implantable device outside of the clinic excluding No cellular tissue based products placed in the center since last visit: Pain Present Now: No Electronic Signature(s) Signed: 12/07/2021 2:46:15 PM By: [name] EMT Entered By: [name] on 12/07/2021 14:46:15 -------------------------------------------------------------------------------- Encounter Discharge Information Details Patient Name: Date of Service: [name], [name] 12/07/2021 10:30 A M Medical Record Number: [id number] Patient Account Number: [id number] Date of Birth/Sex: Treating RN: 1965/08/03 (56 y.o. [name] Primary Care Vennie Waymire: [name] Other Clinician: [name] Referring Raja Caputi: Treating Laurel Harnden/Extender: [name] [name] in Treatment: 7 Encounter Discharge Information  Items Discharge Condition: Stable Ambulatory Status: Ambulatory Discharge Destination: Home Transportation: Private Auto Accompanied By: None Schedule Follow-up Appointment: Yes Clinical Summary of Care: Electronic Signature(s) Signed: 12/07/2021 3:17:51 PM By: [name] EMT Entered By: [name] on 12/07/2021 15:17:51 -------------------------------------------------------------------------------- Vitals Details Patient Name: Date of Service: [name], [name] 12/07/2021 10:30 A M Medical Record Number: [id number] Patient Account Number: [id number] Date of Birth/Sex: Treating RN: June 05, 1966 (56 y.o. [name] Primary Care Mylik Pro: [name] Other Clinician: [name] Referring Tiye Huwe: Treating Kayode Petion/Extender: [name] [name] in Treatment: 7 Vital Signs Time Taken: 10:05 Temperature (F): 98.5 Height (in): 69 Pulse (bpm): 101 Weight (lbs): 180 Respiratory Rate (breaths/min): 18 Body Mass Index (BMI): 26.6 Blood Pressure (mmHg): 131/84 Reference Range: 80 - 120 mg / dl Notes The patient had a Wound Care Clinic visit before his treatment today. Electronic Signature(s) Signed: 12/07/2021 2:47:16 PM By: [name] EMT Entered By: [name] on 12/07/2021 14:47:16

## 2021-12-07 NOTE — Progress Notes (Signed)
Christian, [name] ([id number]) Visit Report for 12/07/2021 Debridement Details Patient Name: Date of Service: [name] [facility] 12/07/2021 9:30 A M Medical Record Number: [id number] Patient Account Number: [id number] Date of Birth/Sex: Treating RN: 11-17-1965 (56 y.o. [name] Primary Care Provider: Clinic, [name] Other Clinician: Referring Provider: Treating Provider/Extender: [name], [name] in Treatment: 7 Debridement Performed for Assessment: Wound #1 Right,Medial T Great oe Performed By: Physician Christian Caul., MD Debridement Type: Debridement Severity of Tissue Pre Debridement: Fat layer exposed Level of Consciousness (Pre-procedure): Awake and Alert Pre-procedure Verification/Time Out Yes - 10:22 Taken: Start Time: 10:23 Pain Control: Other : benzocaine 20% T Area Debrided (L x W): otal 0.2 (cm) x 0.3 (cm) = 0.06 (cm) Tissue and other material debrided: Non-Viable, Subcutaneous Level: Skin/Subcutaneous Tissue Debridement Description: Excisional Instrument: Curette Specimen: Swab, Number of Specimens T aken: 1 Bleeding: Minimum Hemostasis Achieved: Pressure End Time: 10:27 Response to Treatment: Procedure was tolerated well Level of Consciousness (Post- Awake and Alert procedure): Post Debridement Measurements of Total Wound Length: (cm) 0.2 Width: (cm) 0.3 Depth: (cm) 2 Volume: (cm) 0.094 Character of Wound/Ulcer Post Debridement: Stable Severity of Tissue Post Debridement: Fat layer exposed Post Procedure Diagnosis Same as Pre-procedure Electronic Signature(s) Signed: 12/07/2021 4:23:09 PM By: Christian Najjar MD Signed: 12/07/2021 4:46:06 PM By: [name] Entered By: [name] on 12/07/2021 10:35:10 -------------------------------------------------------------------------------- HPI Details Patient Name: Date of Service: [name], [name] 12/07/2021 9:30 A M Medical Record Number: [id number] Patient Account  Number: [id number] Date of Birth/Sex: Treating RN: Nov 01, 1965 (56 y.o. 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 He had an MRI of the right foot on 10/16/2021 that showed signal changes in the first proximal and distal phalanx consistent with acute osteomyelitis. He states that he developed a wound spontaneously to this area 10 days ago. He does have significant peripheral neuropathy and does not have sensation to the wound area or bottom of his feet. He reports being on 3 different antibiotics including clindamycin and doxycycline in the past couple of weeks. He is currently on Augmentin based on wound cultures done in the ED. He has currently been keeping the area covered. He currently denies systemic signs of infection. 4/25; patient presents for follow-up. HBO orders have been placed and we are [facility]. He obtained his chest x-ray with no cardiopulmonary abnormalities. He has been using Dakin's wet-to-dry dressings to the toe wound. He currently denies systemic signs of infection. He is currently taking Augmentin. He has an infectious disease appointment on 5/2. He has not heard back about his ABIs with TBI's. 5/2; patient presents for follow-up. He has been using Dakin's wet-to-dry dressings without issues. He continues to take Augmentin daily. He saw Dr. Algis Liming, infectious disease today. At this time Augmentin is being continued. He has not heard to schedule his ABIs with TBI's. He currently denies signs of infection. He has been approved for HBO treatment and  will start in 1 week. 5/11; patient presents for follow-up. He has been using Dakin's wet-to-dry dressings without issues. He reports doing well with HBO therapy. Today he cannot make it to his treatment however  will be back Friday. He has been consistent in his care. He currently denies signs of infection. 5/18; patient presents for follow-up. He has no issues or complaints today. He has not reached out to establish care with an endocrinologist. He does not want to establish care with an endocrinologist through his [place]. He states he will let me know which providers are covered under his secondary insurance so we can place a new referral. He has been using Dakin's wet-to-dry dressings to the right great toe wound. He has been completing HBO therapy without issues. He currently denies signs of infection. 5/25; patient presents for follow-up. He has no issues or complaints today. He has been using Dakin's wet-to-dry dressings to the right great toe wound. There is more maceration today. He has no complaints about HBO therapy. He denies systemic signs of infection. He continues antibiotics per ID. He has not scheduled his ABIs with TBI's yet. 6/1; patient presents for follow-up. He has been doing gentamicin ointment to the wound bed daily. He reports improvement in wound healing. He continues HBO therapy without issues. He is working on getting his blood glucose levels under control. 6/8; patient is seen today in conjunction with HBO. He is tolerating this well. 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 Involving the right great toe. His wound is on the medial aspect of the right great toe and the first second webspace. He has been using topical gentamicin and gauze. He comes in today with the wound looking benign however significant undermining. This does not probe to bone Electronic Signature(s) Signed: 12/07/2021 4:23:09 PM By: Christian Najjar MD Entered By: [name] on  12/07/2021 10:31:59 -------------------------------------------------------------------------------- Physical Exam Details Patient Name: Date of Service: [name], [name] 12/07/2021 9:30 A M Medical Record Number: [id number] Patient Account Number: [id number] Date of Birth/Sex: Treating RN: Mar 02, 1966 (56 y.o. [name] Primary Care Provider: Clinic, [name] Other Clinician: Referring Provider: Treating Provider/Extender: [name], [name] in Treatment: 7 Constitutional Sitting or standing Blood Pressure is within target range for patient.. Pulse regular and within target range for patient.Marland Kitchen Respirations regular, non-labored and within target range.. Temperature is normal and within the target range for the patient.Marland Kitchen Appears in no distress. Notes Wound exam right foot medial part of the right great toe. Using a #5 curette I removed some surrounding skin to expose the undermining. A relatively large amount of serosanguineous drainage looking purulent was obtained. A specimen was obtained for culture. This does not probe to bone there is no surrounding erythema. Electronic Signature(s) Signed: 12/07/2021 4:23:09 PM By: Christian Najjar MD Entered By: [name] on 12/07/2021 10:33:08 -------------------------------------------------------------------------------- Physician Orders Details Patient Name: Date of Service: [name], [name] 12/07/2021 9:30 A M Medical Record Number: [id number] Patient Account Number: [id number] Date of Birth/Sex: Treating RN: 01-31-1966 (56 y.o. [name] Primary Care Provider: Clinic, [name] Other Clinician: Referring Provider: Treating Provider/Extender: [name], [name] in Treatment: 7 Verbal / Phone Orders: No Diagnosis Coding ICD-10 Coding Code Description E11.621 Type 2 diabetes mellitus with foot ulcer L97.514 Non-pressure chronic ulcer of other part of right foot with  necrosis of bone M86.071 Acute hematogenous osteomyelitis, right ankle and foot Follow-up Appointments ppointment in 1 week. - w/ Dr. Mikey Bussing and Lennox Laity, RN Room # 7 6/15 @ 9:30am. Return A Bathing/ Shower/ Hygiene May shower with protection but do not get wound dressing(s) wet. Edema Control - Lymphedema / SCD / Other Elevate legs to the  level of the heart or above for 30 minutes daily and/or when sitting, a frequency of: Avoid standing for long periods of time. Moisturize legs daily. Off-Loading Other: - Keep pressure off of right foot Hyperbaric Oxygen Therapy Indication: - Wagner Grade 3/ + osteomyelitis Right Great Toe If appropriate for treatment, begin HBOT per protocol: 2.0 ATA for 90 Minutes with 2 Five (5) Minute A Breaks ir Total Number of Treatments: - 40 One treatments per day (delivered Monday through Friday unless otherwise specified in Special Instructions below): Finger stick Blood Glucose Pre- and Post- HBOT Treatment. Follow Hyperbaric Oxygen Glycemia Protocol A frin (Oxymetazoline HCL) 0.05% nasal spray - 1 spray in both nostrils daily as needed prior to HBO treatment for difficulty clearing ears Wound Treatment Wound #1 - T Great oe Wound Laterality: Right, Medial Cleanser: Soap and Water 1 x Per Day/30 Days Discharge Instructions: May shower and wash wound with dial antibacterial soap and water prior to dressing change. Cleanser: Wound Cleanser (Generic) 1 x Per Day/30 Days Discharge Instructions: Cleanse the wound with wound cleanser prior to applying a clean dressing using gauze sponges, not tissue or cotton balls. Topical: Gentamicin 1 x Per Day/30 Days Discharge Instructions: As directed by physician Prim Dressing: KerraCel Ag Gelling Fiber Dressing, 2x2 in (silver alginate) 1 x Per Day/30 Days ary Discharge Instructions: Apply silver alginate to wound bed as instructed Secondary Dressing: Woven Gauze Sponges 2x2 in 1 x Per Day/30 Days Discharge  Instructions: Apply over primary dressing as directed. Secured With: Insurance underwriterConforming Stretch Gauze Bandage, Sterile 2x75 (in/in) (Generic) 1 x Per Day/30 Days Discharge Instructions: Secure with stretch gauze as directed. Secured With: 45M Medipore H Soft Cloth Surgical T ape, 4 x 10 (in/yd) (Generic) 1 x Per Day/30 Days Discharge Instructions: Secure with tape as directed. Laboratory erobe culture (MICRO) - (ICD10 L97.514 - Non-pressure chronic ulcer of other part of right foot Bacteria identified in Unspecified specimen by A with necrosis of bone) LOINC Code: 634-6 Convenience Name: Aerobic culture-specimen not specified GLYCEMIA INTERVENTIONS PROTOCOL PRE-HBO GLYCEMIA INTERVENTIONS ACTION INTERVENTION Obtain pre-HBO capillary blood glucose (ensure 1 physician order is in chart). A. Notify HBO physician and await physician orders. 2 If result is 70 mg/dl or below: B. If the result meets the hospital definition of a critical result, follow hospital policy. A. Give patient an 8 ounce Glucerna Shake, an 8 ounce Ensure, or 8 ounces of a Glucerna/Ensure equivalent dietary supplement*. B. Wait 30 minutes. If result is 71 mg/dl to 161130 mg/dl: C. Retest patients capillary blood glucose (CBG). D. If result greater than or equal to 110 mg/dl, proceed with HBO. If result less than 110 mg/dl, notify HBO physician and consider holding HBO. If result is 131 mg/dl to 096249 mg/dl: A. Proceed with HBO. A. Notify HBO physician and await physician orders. B. It is recommended to hold HBO and do If result is 250 mg/dl or greater: blood/urine ketone testing. C. If the result meets the hospital definition of a critical result, follow hospital policy. POST-HBO GLYCEMIA INTERVENTIONS ACTION INTERVENTION Obtain post HBO capillary blood glucose (ensure 1 physician order is in chart). A. Notify HBO physician and await physician orders. 2 If result is 70 mg/dl or below: B. If the result meets the  hospital definition of a critical result, follow hospital policy. A. Give patient an 8 ounce Glucerna Shake, an 8 ounce Ensure, or 8 ounces of a Glucerna/Ensure equivalent dietary supplement*. B. Wait 15 minutes for symptoms of If result is 71 mg/dl to  100 mg/dl: hypoglycemia (i.e. nervousness, anxiety, sweating, chills, clamminess, irritability, confusion, tachycardia or dizziness). C. If patient asymptomatic, discharge patient. If patient symptomatic, repeat capillary blood glucose (CBG) and notify HBO physician. If result is 101 mg/dl to 324 mg/dl: A. Discharge patient. A. Notify HBO physician and await physician orders. B. It is recommended to do blood/urine ketone If result is 250 mg/dl or greater: testing. C. If the result meets the hospital definition of a critical result, follow hospital policy. *Juice or candies are NOT equivalent products. If patient refuses the Glucerna or Ensure, please consult the hospital dietitian for an appropriate substitute. Electronic Signature(s) Signed: 12/07/2021 4:23:09 PM By: Christian Najjar MD Signed: 12/07/2021 4:46:06 PM By: [name] Entered By: [name] on 12/07/2021 10:33:37 -------------------------------------------------------------------------------- Problem List Details Patient Name: Date of Service: [name], [name] 12/07/2021 9:30 A M Medical Record Number: [id number] Patient Account Number: [id number] Date of Birth/Sex: Treating RN: 12/08/1965 (56 y.o. [name] Primary Care Provider: [facility], [name] Other Clinician: Referring Provider: Treating Provider/Extender: [name], [name] in Treatment: 7 Active Problems ICD-10 Encounter Code Description Active Date MDM Diagnosis E11.621 Type 2 diabetes mellitus with foot ulcer 10/19/2021 No Yes L97.514 Non-pressure chronic ulcer of other part of right foot with necrosis of bone 10/19/2021 No Yes M86.071 Acute hematogenous  osteomyelitis, right ankle and foot 10/19/2021 No Yes Inactive Problems Resolved Problems Electronic Signature(s) Signed: 12/07/2021 4:23:09 PM By: Christian Najjar MD Previous Signature: 12/07/2021 10:09:05 AM Version By: [name] Entered By: [name] on 12/07/2021 10:30:27 -------------------------------------------------------------------------------- Progress Note Details Patient Name: Date of Service: [name], [name] 12/07/2021 9:30 A M Medical Record Number: [id number] Patient Account Number: [id number] Date of Birth/Sex: Treating RN: 07-02-1966 (56 y.o. 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 He had an MRI of the right foot on 10/16/2021 that showed signal changes in the first proximal and distal phalanx consistent with acute osteomyelitis. He states that he developed a wound spontaneously to this area 10 days ago. He does have significant peripheral neuropathy and does not have sensation to the wound area or bottom of his feet. He reports being on 3 different antibiotics including clindamycin and doxycycline in the past couple of weeks. He is currently on Augmentin based on wound cultures done in the ED. He has currently been keeping the area covered. He currently denies systemic signs of infection. 4/25; patient presents for follow-up. HBO orders have been placed and we are awaiting [facility] approval. He obtained his chest x-ray with no cardiopulmonary abnormalities. He has been using Dakin's wet-to-dry dressings to the toe wound. He currently denies systemic  signs of infection. He is currently taking Augmentin. He has an infectious disease appointment on 5/2. He has not heard back about his ABIs with TBI's. 5/2; patient presents for follow-up. He has been using Dakin's wet-to-dry dressings without issues. He continues to take Augmentin daily. He saw Dr. Algis Liming, infectious disease today. At this time Augmentin is being continued. He has not heard to schedule his ABIs with TBI's. He currently denies signs of infection. He has been approved for HBO treatment and will start in 1 week.  5/11; patient presents for follow-up. He has been using Dakin's wet-to-dry dressings without issues. He reports doing well with HBO therapy. Today he cannot make it to his treatment however will be back Friday. He has been consistent in his care. He currently denies signs of infection. 5/18; patient presents for follow-up. He has no issues or complaints today. He has not reached out to establish care with an endocrinologist. He does not want to establish care with an endocrinologist through his [place]. He states he will let me know which providers are covered under his secondary insurance so we can place a new referral. He has been using Dakin's wet-to-dry dressings to the right great toe wound. He has been completing HBO therapy without issues. He currently denies signs of infection. 5/25; patient presents for follow-up. He has no issues or complaints today. He has been using Dakin's wet-to-dry dressings to the right great toe wound. There is more maceration today. He has no complaints about HBO therapy. He denies systemic signs of infection. He continues antibiotics per ID. He has not scheduled his ABIs with TBI's yet. 6/1; patient presents for follow-up. He has been doing gentamicin ointment to the wound bed daily. He reports improvement in wound healing. He continues HBO therapy without issues. He is working on getting his blood glucose levels under control. 6/8; patient is  seen today in conjunction with HBO. He is tolerating this well. 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 Involving the right great toe. His wound is on the medial aspect of the right great toe and the first second webspace. He has been using topical gentamicin and gauze. He comes in today with the wound looking benign however significant undermining. This does not probe to bone Objective Constitutional Sitting or standing Blood Pressure is within target range for patient.. Pulse regular and within target range for patient.Marland Kitchen Respirations regular, non-labored and within target range.. Temperature is normal and within the target range for the patient.Marland Kitchen Appears in no distress. Vitals Time Taken: 10:05 AM, Height: 69 in, Weight: 180 lbs, BMI: 26.6, Temperature: 98.5 F, Pulse: 101 bpm, Respiratory Rate: 18 breaths/min, Blood Pressure: 131/84 mmHg. General Notes: Wound exam right foot medial part of the right great toe. Using a #5 curette I removed some surrounding skin to expose the undermining. A relatively large amount of serosanguineous drainage looking purulent was obtained. A specimen was obtained for culture. This does not probe to bone there is no surrounding erythema. Integumentary (Hair, Skin) Wound #1 status is Open. Original cause of wound was Gradually Appeared. The date acquired was: 10/10/2021. The wound has been in treatment 7 weeks. The wound is located on the Right,Medial T Great. The wound measures 0.2cm length x 0.3cm width x 2cm depth; 0.047cm^2 area and 0.094cm^3 volume. There oe is Fat Layer (Subcutaneous Tissue) exposed. There is no tunneling or undermining noted. There is a medium amount of serosanguineous drainage noted. The wound margin is distinct with the outline attached to the wound base. There is large (67-100%) red, pink granulation within the wound bed. There is a small (1- 33%) amount of necrotic tissue within the wound  bed. Assessment Active Problems ICD-10 Type 2 diabetes mellitus with foot ulcer Non-pressure chronic ulcer of other part of right foot with necrosis of bone Acute hematogenous osteomyelitis, right ankle and foot Procedures Wound #1 Pre-procedure diagnosis of Wound #1 is a Diabetic Wound/Ulcer of the Lower Extremity located on the Right,Medial T Great .Severity of Tissue Pre oe Debridement is:  Fat layer exposed. There was a Excisional Skin/Subcutaneous Tissue Debridement with a total area of 0.06 sq cm performed by Christian Caul., MD. With the following instrument(s): Curette to remove Non-Viable tissue/material. Material removed includes Subcutaneous Tissue after achieving pain control using Other (benzocaine 20%). 1 specimen was taken by a Swab and sent to the lab per facility protocol. A time out was conducted at 10:22, prior to the start of the procedure. A Minimum amount of bleeding was controlled with Pressure. The procedure was tolerated well. Post Debridement Measurements: 0.2cm length x 0.3cm width x 2cm depth; 0.094cm^3 volume. Character of Wound/Ulcer Post Debridement is stable. Severity of Tissue Post Debridement is: Fat layer exposed. Post procedure Diagnosis Wound #1: Same as Pre-Procedure Plan 1. During the course of my debridement an abscess was unroofed. Specimen obtained for culture 2. The patient is followed by infectious disease and is on Augmentin. Apparently saw Dr. Algis Liming last week and had the Augmentin renewed so the culture that I did today is on Augmentin. 3. Depending on the results of the culture I did antibiotics may need to be altered or additional antibiotics provided. 4. I am not certain whether any additional imaging/MRI is going to be necessary 5. I provided him with a surgical shoe so that the first and second toes could be adequately separated to provide pressure relief. Electronic Signature(s) Signed: 12/07/2021 4:23:09 PM By: Christian Najjar  MD Entered By: [name] on 12/07/2021 10:34:42 -------------------------------------------------------------------------------- SuperBill Details Patient Name: Date of Service: [name], [name] 12/07/2021 Medical Record Number: [id number] Patient Account Number: [id number] Date of Birth/Sex: Treating RN: May 01, 1966 (56 y.o. [name] Primary Care Provider: Clinic, [name] Other Clinician: Referring Provider: Treating Provider/Extender: [name], [name] in Treatment: 7 Diagnosis Coding ICD-10 Codes Code Description E11.621 Type 2 diabetes mellitus with foot ulcer L97.514 Non-pressure chronic ulcer of other part of right foot with necrosis of bone M86.071 Acute hematogenous osteomyelitis, right ankle and foot Facility Procedures CPT4 Code: [id number] Description: 11042 - DEB SUBQ TISSUE 20 SQ CM/< ICD-10 Diagnosis Description L97.514 Non-pressure chronic ulcer of other part of right foot with necrosis of bone M86.071 Acute hematogenous osteomyelitis, right ankle and foot Modifier: Quantity: 1 Physician Procedures : CPT4 Code Description Modifier [id number] 11042 - WC PHYS SUBQ TISS 20 SQ CM ICD-10 Diagnosis Description L97.514 Non-pressure chronic ulcer of other part of right foot with necrosis of bone M86.071 Acute hematogenous osteomyelitis, right ankle and foot Quantity: 1 Electronic Signature(s) Signed: 12/07/2021 4:23:09 PM By: Christian Najjar MD Signed: 12/07/2021 4:46:06 PM By: [name] Entered By: [name] on 12/07/2021 10:45:16

## 2021-12-07 NOTE — Progress Notes (Addendum)
RYLANN, CRISTINA ([id number]) Visit Report for 12/07/2021 HBO Details Patient Name: Date of Service: [name] [facility] 12/07/2021 10:30 A M Medical Record Number: [id number] Patient Account Number: [id number] Date of Birth/Sex: Treating RN: [birth year] (56 y.o. [name] Primary Care Graham Doukas: [name] Other Clinician: [name] Referring Enaya Howze: Treating Airon Sahni/Extender: [name] [facility], [name] in Treatment: 7 HBO Treatment Course Details Treatment Course Number: 1 Ordering Paytin Ramakrishnan: [name] T Treatments Ordered: otal 40 HBO Treatment Start Date: 11/06/2021 HBO Indication: Diabetic Ulcer(s) of the Lower Extremity Standard/Conservative Wound Care tried and failed greater than or equal to 30 days Wound #1 Right, Medial T Great oe HBO Treatment Details Treatment Number: 21 Patient Type: Outpatient Chamber Type: Monoplace Chamber Serial #: [id number] Treatment Protocol: 2.0 ATA with 90 minutes oxygen, with two 5 minute air breaks Treatment Details Compression Rate Down: 2.0 psi / minute De-Compression Rate Up: 2.0 psi / minute A breaks and breathing ir Compress Tx Pressure periods Decompress Decompress Begins Reached (leave unused spaces Begins Ends blank) Chamber Pressure (ATA 1 2 2 2 2 2  --2 1 ) Clock Time (24 hr) 11:07 11:15 11:46 11:51 12:21 12:26 - - 15:56 13:04 Treatment Length: 117 (minutes) Treatment Segments: 4 Vital Signs Capillary Blood Glucose Reference Range: 80 - 120 mg / dl HBO Diabetic Blood Glucose Intervention Range: <131 mg/dl or >782 mg/dl Time Vitals Blood Respiratory Capillary Blood Glucose Pulse Action Type: Pulse: Temperature: Taken: Pressure: Rate: Glucose (mg/dl): Meter #: Oximetry (%) Taken: Pre 10:05 131/84 101 18 98.5 Post 13:09 134/92 104 16 98.7 119 Pre 11:01 148 Treatment Response Treatment Toleration: Well Treatment Completion Status: Treatment Completed without Adverse Event Additional  Procedure Documentation Tissue Sevierity: Necrosis of bone Physician HBO Attestation: I certify that I supervised this HBO treatment in accordance with Medicare guidelines. A trained emergency response team is readily available per Yes hospital policies and procedures. Continue HBOT as ordered. Yes Electronic Signature(s) Signed: 12/07/2021 3:55:50 PM By: [name] MD FACS Previous Signature: 12/07/2021 2:51:07 PM Version By: [name] EMT Entered By: [name] on 12/07/2021 15:55:50 -------------------------------------------------------------------------------- HBO Safety Checklist Details Patient Name: Date of Service: [name], [name] 12/07/2021 10:30 A M Medical Record Number: [id number] Patient Account Number: [id number] Date of Birth/Sex: Treating RN: [birth year] (56 y.o. [name] Primary Care Yeraldy Spike: [name] Other Clinician: [name] Referring Clair Bardwell: Treating Angle Karel/Extender: [name] [facility], [name] in Treatment: 7 HBO Safety Checklist Items Safety Checklist Consent Form Signed Patient voided / foley secured and emptied When did you last eato 0845 Last dose of injectable or oral agent 0830 Ostomy pouch emptied and vented if applicable NA All implantable devices assessed, documented and approved NA Intravenous access site secured and place NA Valuables secured Linens and cotton and cotton/polyester blend (less than 51% polyester) Personal oil-based products / skin lotions / body lotions removed Wigs or hairpieces removed NA Smoking or tobacco materials removed Books / newspapers / magazines / loose paper removed Cologne, aftershave, perfume and deodorant removed Jewelry removed (may wrap wedding band) NA Make-up removed NA Hair care products removed Battery operated devices (external) removed Heating patches and chemical warmers removed Titanium eyewear removed NA Frames approver by [employer] Nail polish cured greater than 10 hours NA Casting material cured greater than 10 hours NA Hearing aids removed NA Loose dentures or partials removed NA Prosthetics have been removed NA Patient demonstrates correct use of air break device (if applicable) Patient concerns have been addressed Patient grounding  bracelet on and cord attached to chamber Specifics for Inpatients (complete in addition to above) Medication sheet sent with patient NA Intravenous medications needed or due during therapy sent with patient NA Drainage tubes (e.g. nasogastric tube or chest tube secured and vented) NA Endotracheal or Tracheotomy tube secured NA Cuff deflated of air and inflated with saline NA Airway suctioned NA Notes The safety checklist was done before treatment started. Electronic Signature(s) Signed: 12/07/2021 2:48:59 PM By: [name] EMT Entered By: [name] on 12/07/2021 14:48:59

## 2021-12-07 NOTE — Progress Notes (Signed)
DAISHON, CHUI ([id number]) Visit Report for 12/07/2021 Problem List Details Patient Name: Date of Service: [name] [facility] 12/07/2021 10:30 A M Medical Record Number: [id number] Patient Account Number: [id number] Date of Birth/Sex: Treating RN: 1965/10/19 (56 y.o. [name] Primary Care Provider: [name] Other Clinician: [name] Referring Provider: Treating Provider/Extender: [name] [facility], [name] in Treatment: 7 Active Problems ICD-10 Encounter Code Description Active Date MDM Diagnosis E11.621 Type 2 diabetes mellitus with foot ulcer 10/19/2021 No Yes L97.514 Non-pressure chronic ulcer of other part of right foot with necrosis of bone 10/19/2021 No Yes M86.071 Acute hematogenous osteomyelitis, right ankle and foot 10/19/2021 No Yes Inactive Problems Resolved Problems Electronic Signature(s) Signed: 12/07/2021 2:51:39 PM By: [name] EMT Signed: 12/07/2021 3:54:01 PM By: [name] MD FACS Entered By: [name] on 12/07/2021 14:51:38 -------------------------------------------------------------------------------- SuperBill Details Patient Name: Date of Service: [name], [name] 12/07/2021 Medical Record Number: [id number] Patient Account Number: [id number] Date of Birth/Sex: Treating RN: 01-14-1966 (57 y.o. 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, right ankle and foot Facility Procedures CPT4 Code: [id number] Description: G0277-(Facility Use Only) HBOT full body chamber, , ICD-10 Diagnosis Description E11.621 Type 2 diabetes mellitus  with foot ulcer L97.514 Non-pressure chronic ulcer of other part of right foot with necrosis of bone M86.071 Acute  hematogenous osteomyelitis, right ankle and foot Modifier: Quantity: 4 Physician Procedures : CPT4 Code Description Modifier [id number] - WC PHYS HYPERBARIC OXYGEN THERAPY ICD-10 Diagnosis Description E11.621 Type 2 diabetes mellitus with foot ulcer L97.514 Non-pressure chronic ulcer of other part of right foot with necrosis of bone M86.071  Acute hematogenous osteomyelitis, right ankle and foot Quantity: 1 Electronic Signature(s) Signed: 12/07/2021 2:51:32 PM By: [name] EMT Signed: 12/07/2021 3:54:01 PM By: [name] MD FACS Entered By: [name] on 12/07/2021 14:51:31

## 2021-12-08 ENCOUNTER — Encounter (HOSPITAL_BASED_OUTPATIENT_CLINIC_OR_DEPARTMENT_OTHER): Payer: No Typology Code available for payment source | Admitting: General Surgery

## 2021-12-08 LAB — GLUCOSE, CAPILLARY: Glucose-Capillary: 119 mg/dL — ABNORMAL HIGH (ref 70–99)

## 2021-12-09 LAB — AEROBIC CULTURE W GRAM STAIN (SUPERFICIAL SPECIMEN)

## 2021-12-11 ENCOUNTER — Encounter (HOSPITAL_BASED_OUTPATIENT_CLINIC_OR_DEPARTMENT_OTHER): Payer: No Typology Code available for payment source | Admitting: Internal Medicine
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
## 2021-12-12 ENCOUNTER — Encounter (HOSPITAL_BASED_OUTPATIENT_CLINIC_OR_DEPARTMENT_OTHER): Payer: No Typology Code available for payment source | Admitting: Internal Medicine

## 2021-12-12 DIAGNOSIS — M86071 Acute hematogenous osteomyelitis, right ankle and foot: Secondary | ICD-10-CM

## 2021-12-12 DIAGNOSIS — E11621 Type 2 diabetes mellitus with foot ulcer: Secondary | ICD-10-CM

## 2021-12-12 DIAGNOSIS — L97514 Non-pressure chronic ulcer of other part of right foot with necrosis of bone: Secondary | ICD-10-CM

## 2021-12-12 LAB — GLUCOSE, CAPILLARY
Glucose-Capillary: 232 mg/dL — ABNORMAL HIGH (ref 70–99)
Glucose-Capillary: 213 mg/dL — ABNORMAL HIGH (ref 70–99)

## 2021-12-12 NOTE — Progress Notes (Signed)
[facility] Note  12/15/2021     CHIEF COMPLAINT Patient presents for Retina Follow Up   HISTORY OF PRESENT ILLNESS: [name] is a 56 y.o. male who presents to the clinic today for:   HPI     Retina Follow Up   Patient presents with  Diabetic Retinopathy.  In both eyes.  Severity is severe.  Duration of 4 weeks.  Since onset it is stable.  I, the attending physician,  performed the HPI with the patient and updated documentation appropriately.        Comments   Patient states vision the same OU. BS under better control.        Last edited by Bernarda Caffey, MD on 12/15/2021 10:02 AM.    Patient states he found out he need sx to amputate his toe.     Referring physician: [facility], [facility] [place],  [place] [address]  HISTORICAL INFORMATION:   Selected notes from the MEDICAL RECORD NUMBER Referred by [facility] for retinal hemes LEE:  Ocular Hx- PMH-    CURRENT MEDICATIONS: No current outpatient medications on file. (Ophthalmic Drugs)   No current facility-administered medications for this visit. (Ophthalmic Drugs)   Current Outpatient Medications (Other)  Medication Sig   amLODipine (NORVASC) 10 MG tablet Take 10 mg by mouth daily.   amoxicillin-clavulanate (AUGMENTIN) 875-125 MG tablet Take 1 tablet by mouth every 12 (twelve) hours.   diphenhydrAMINE (BENADRYL) 25 MG tablet Take 1 tablet (25 mg total) by mouth every 6 (six) hours as needed for itching or allergies.   oxyCODONE-acetaminophen (PERCOCET/ROXICET) 5-325 MG per tablet Take 2 tablets by mouth every 4 (four) hours as needed for pain.   polyethylene glycol (MIRALAX / GLYCOLAX) 17 g packet Take 17 g by mouth daily.   Semaglutide (OZEMPIC, 1 MG/DOSE, [place]) Inject 1 mg into the skin once a week.   SUMAtriptan (IMITREX) 50 MG tablet Take 50 mg by mouth every 2 (two) hours as needed for migraine or headache. May repeat in 2 hours if headache persists  or recurs.   testosterone cypionate (DEPOTESTOTERONE CYPIONATE) 100 MG/ML injection Inject 100 mg into the muscle every 14 (fourteen) days. For IM use only   Zolpidem Tartrate (AMBIEN PO) Take by mouth.   docusate sodium (COLACE) 100 MG capsule Take 1 capsule (100 mg total) by mouth every 12 (twelve) hours. (Patient not taking: Reported on 10/31/2021)   Dulaglutide (TRULICITY [place]) Inject into the skin. (Patient not taking: Reported on 10/31/2021)   escitalopram (LEXAPRO) 10 MG tablet Take 10 mg by mouth daily. (Patient not taking: Reported on 10/31/2021)   glipiZIDE (GLUCOTROL) 10 MG tablet Take 1 tablet (10 mg total) by mouth 2 (two) times daily before a meal. (Patient not taking: Reported on 10/31/2021)   HYDROCHLOROTHIAZIDE PO Take by mouth. (Patient not taking: Reported on 12/04/2021)   insulin aspart protamine- aspart (NOVOLOG MIX 70/30) (70-30) 100 UNIT/ML injection Inject into the skin. (Patient not taking: Reported on 06/23/2021)   lisinopril (PRINIVIL,ZESTRIL) 10 MG tablet Take 10 mg by mouth daily. (Patient not taking: Reported on 10/31/2021)   loratadine (CLARITIN) 10 MG tablet TAKE 1 TABLET BY MOUTH ONCE DAILY   losartan (COZAAR) 100 MG tablet Take 100 mg by mouth daily. (Patient not taking: Reported on 06/23/2021)   OMEPRAZOLE PO Take by mouth. (Patient not taking: Reported on 10/31/2021)   No current facility-administered medications for this visit. (Other)   REVIEW OF SYSTEMS: ROS  Positive for: Endocrine, Eyes Negative for: Constitutional, Gastrointestinal, Neurological, Skin, Genitourinary, Musculoskeletal, HENT, Cardiovascular, Respiratory, Psychiatric, Allergic/Imm, Heme/Lymph Last edited by Jobe Marker, COT on 12/15/2021  8:38 AM.     ALLERGIES Allergies  Allergen Reactions   Lisinopril Other (See Comments)    Other reaction(s): Electrocardiogram abnormal   Sildenafil Nausea And Vomiting   PAST MEDICAL HISTORY Past Medical History:  Diagnosis Date   Arthritis    Diabetes  mellitus    Fistula, perirectal    GERD (gastroesophageal reflux disease)    Hypertension    Osteomyelitis of great toe of right foot ([place]) 10/31/2021   Polymicrobial bacterial infection 10/31/2021   PTSD (post-traumatic stress disorder)    PTSD (post-traumatic stress disorder) 10/31/2021   Small bowel obstruction (HCC)    Past Surgical History:  Procedure Laterality Date   RECTAL SURGERY     FAMILY HISTORY History reviewed. No pertinent family history.  SOCIAL HISTORY Social History   Tobacco Use   Smoking status: Some Days    Types: Cigars   Smokeless tobacco: Never   Tobacco comments:    Occasional cigar  Vaping Use   Vaping Use: Never used  Substance Use Topics   Alcohol use: Not Currently    Comment: occ   Drug use: No       OPHTHALMIC EXAM:  Base Eye Exam     Visual Acuity (Snellen - Linear)       Right Left   Dist cc 20/100 +1 20/25 -2   Dist ph cc NI 20/25 +2    Correction: Glasses         Tonometry (Tonopen, 8:46 AM)       Right Left   Pressure 19 15         Pupils       Dark Light Shape React APD   Right 3 2 Round Minimal None   Left 3 2 Round Minimal None         Visual Fields (Counting fingers)       Left Right    Full Full         Extraocular Movement       Right Left    Full, Ortho Full, Ortho         Neuro/Psych     Oriented x3: Yes   Mood/Affect: Normal         Dilation     Both eyes: 1.0% Mydriacyl, 2.5% Phenylephrine @ 8:46 AM           Slit Lamp and Fundus Exam     Slit Lamp Exam       Right Left   Lids/Lashes Dermatochalasis - upper lid Dermatochalasis - upper lid   Conjunctiva/Sclera mild melanosis, nasal pingeucula mild melanosis, nasal and temporal pinguecula   Cornea arcus, trace PEE, trace tear film debris arcus, trace PEE, mild tear film debris   Anterior Chamber deep, clear, narrow temporal angle Deep and quiet   Iris Round and dilated, No NVI Round and dilated, No NVI   Lens 3+ Nuclear  sclerosis with brunescence, 2-3+ Cortical cataract 2-3+ Nuclear sclerosis with brunescence, 2-3+ Cortical cataract   Anterior Vitreous Vitreous syneresis, mild blood stained vitreous condensations - improved Vitreous syneresis         Fundus Exam       Right Left   Disc Pink and Sharp, no NVD, Compact Pink and Sharp, no NVD   C/D Ratio 0.2 0.2   Macula Flat, good foveal reflex,  scattered MA/DBH, CWS SN / IT mac Blunted foveal reflex, central edema with cluster of DBH temporal fovea - improved, scattered DBH and CWS temporal mac   Vessels attenuated, Tortuous attenuated, Tortuous, mild Copper wiring   Periphery Attached, scattered DBH and CWS posteriorly Attached, scattered DBH and CWS posteriorly           Refraction     Wearing Rx       Sphere Cylinder Axis Add   Right +2.25 +0.50 165 +2.00   Left +2.25 +0.75 172 +2.00            IMAGING AND PROCEDURES  Imaging and Procedures for 12/15/2021  OCT, Retina - OU - Both Eyes       Right Eye Quality was good. Central Foveal Thickness: 244. Progression has been stable. Findings include normal foveal contour, no IRF, no SRF, vitreomacular adhesion (Stable improvement in vitreous opacities, irregular lamination, mild scattered IRHM, central ellipsoid thinning).   Left Eye Quality was good. Central Foveal Thickness: 282. Progression has been stable. Findings include normal foveal contour, no IRF, no SRF, intraretinal hyper-reflective material (Stable improvement in IRF/IRHM temporal fovea and macula - just trace cystic changes remain).   Notes *Images captured and stored on drive  Diagnosis / Impression:  OD: Stable improvement in vitreous opacities, irregular lamination, mild scattered IRHM, central ellipsoid thinning OS: Stable improvement in IRF/IRHM temporal fovea and macula - just trace cystic changes remain  Clinical management:  See below  Abbreviations: NFP - Normal foveal profile. CME - cystoid macular edema.  PED - pigment epithelial detachment. IRF - intraretinal fluid. SRF - subretinal fluid. EZ - ellipsoid zone. ERM - epiretinal membrane. ORA - outer retinal atrophy. ORT - outer retinal tubulation. SRHM - subretinal hyper-reflective material. IRHM - intraretinal hyper-reflective material      Intravitreal Injection, Pharmacologic Agent - OS - Left Eye       Time Out 12/15/2021. 9:03 AM. Confirmed correct patient, procedure, site, and patient consented.   Anesthesia Topical anesthesia was used. Anesthetic medications included Lidocaine 2%, Proparacaine 0.5%.   Procedure Preparation included 5% betadine to ocular surface, eyelid speculum. A (32g) needle was used.   Injection: 1.25 mg Bevacizumab 1.82m/0.05ml   Route: Intravitreal, Site: Left Eye   NDC: [id number] Lot:: [id number] Expiration date: 02/08/2022   Post-op Post injection exam found visual acuity of at least counting fingers. The patient tolerated the procedure well. There were no complications. The patient received written and verbal post procedure care education. Post injection medications were not given.            ASSESSMENT/PLAN:    ICD-10-CM   1. Severe nonproliferative diabetic retinopathy of both eyes with macular edema associated with type 2 diabetes mellitus (HCC)  E11.3413 OCT, Retina - OU - Both Eyes    Intravitreal Injection, Pharmacologic Agent - OS - Left Eye    Bevacizumab (AVASTIN) SOLN 1.25 mg    2. Essential hypertension  I10     3. Hypertensive retinopathy of both eyes  H35.033     4. Retinal ischemia  H35.82     5. Combined forms of age-related cataract of both eyes  H25.813      1. Severe non-proliferative diabetic retinopathy, both eyes  - pt lost to follow up from 01.11.23 to 03.22.23 -- family   - pt reports recent improvement in A1c from 11 down to 6 since starting Ozempic - s/p IVA OS #1 (03.22.23), #2 (04.21.23), #3 (05.19.23) - exam shows scattered MA,  DBH and CWS OU, mild VH OD and  central cystic changes OS -- improving - FA (12.23.22) scattered MA OU, no NV OU - OCT shows OD: Stable improvement in vitreous opacities, irregular lamination, mild scattered IRHM, central ellipsoid thinning; OS: Stable improvement in IRF/IRHM temporal fovea and macula - just trace cystic changes remain at 4 wks - recommend IVA OS #4 today, 06.16.23 w/ ext f/u to 6 wks - pt wishes to proceed - RBA of procedure discussed, questions answered - informed consent obtained and signed - see procedure note - f/u in 6 wks -- DFE/OCT, possible injection  2-4. Hypertensive retinopathy w/ retinal ischemia OU  - BP uncontrolled at initial consult -- 170s / 110-120s in office 12.23.22, today BP is significantly improved to 147/80  - FA 12.23.22 shows significant retinal ischemia OU -- enlarged FAZ OU (OD > OS) and significant patches of capillary drop out OU (OD>OS) - discussed importance of tight BP control and risk of MI and stroke w/ elevated BP  5. Mixed Cataract OU - The symptoms of cataract, surgical options, and treatments and risks were discussed with patient. - discussed diagnosis and progression - visually significant  - referred to [facility] for cat eval and establishment of primary eye care, but pt has not been there yet - will f/u on referral  Ophthalmic Meds Ordered this visit:  Meds ordered this encounter  Medications   Bevacizumab (AVASTIN) SOLN 1.25 mg     Return in about 6 weeks (around 01/26/2022) for DFE, OCT, possible injection.  There are no Patient Instructions on file for this visit.   Explained the diagnoses, plan, and follow up with the patient and they expressed understanding.  Patient expressed understanding of the importance of proper follow up care.   This document serves as a record of services personally performed by Gardiner Sleeper, MD, PhD. It was created on their behalf by Leonie Douglas, an ophthalmic technician. The creation of this record is the  provider's dictation and/or activities during the visit.    Electronically signed by: Leonie Douglas COA, 12/15/21  10:11 AM   Gardiner Sleeper, M.D., Ph.D. Diseases & Surgery of the Retina and Vitreous [facility]  I have reviewed the above documentation for accuracy and completeness, and I agree with the above. Gardiner Sleeper, M.D., Ph.D. 12/15/21 10:11 AM   Abbreviations: M myopia (nearsighted); A astigmatism; H hyperopia (farsighted); P presbyopia; Mrx spectacle prescription;  CTL contact lenses; OD right eye; OS left eye; OU both eyes  XT exotropia; ET esotropia; PEK punctate epithelial keratitis; PEE punctate epithelial erosions; DES dry eye syndrome; MGD meibomian gland dysfunction; ATs artificial tears; PFAT's preservative free artificial tears; [place] nuclear sclerotic cataract; PSC posterior subcapsular cataract; ERM epi-retinal membrane; PVD posterior vitreous detachment; RD retinal detachment; DM diabetes mellitus; DR diabetic retinopathy; NPDR non-proliferative diabetic retinopathy; PDR proliferative diabetic retinopathy; CSME clinically significant macular edema; DME diabetic macular edema; dbh dot blot hemorrhages; CWS cotton wool spot; POAG primary open angle glaucoma; C/D cup-to-disc ratio; HVF humphrey visual field; GVF goldmann visual field; OCT optical coherence tomography; IOP intraocular pressure; BRVO Branch retinal vein occlusion; CRVO central retinal vein occlusion; CRAO central retinal artery occlusion; BRAO branch retinal artery occlusion; RT retinal tear; SB scleral buckle; PPV pars plana vitrectomy; VH Vitreous hemorrhage; PRP panretinal laser photocoagulation; IVK intravitreal kenalog; VMT vitreomacular traction; MH Macular hole;  NVD neovascularization of the disc; NVE neovascularization elsewhere; AREDS age related eye disease study; ARMD  age related macular degeneration; POAG primary open angle glaucoma; EBMD epithelial/anterior basement membrane  dystrophy; ACIOL anterior chamber intraocular lens; IOL intraocular lens; PCIOL posterior chamber intraocular lens; Phaco/IOL phacoemulsification with intraocular lens placement; [place] photorefractive keratectomy; LASIK laser assisted in situ keratomileusis; HTN hypertension; DM diabetes mellitus; COPD chronic obstructive pulmonary disease

## 2021-12-12 NOTE — Progress Notes (Addendum)
DAXON, GREASON ([id number]) Visit Report for 12/12/2021 HBO Details Patient Name: Date of Service: [name] [facility] 12/12/2021 10:00 A M Medical Record Number: [id number] Patient Account Number: [id number] Date of Birth/Sex: Treating RN: 1966/04/15 (56 y.o. [name] Primary Care [place]: [facility], [name] Other Clinician: [name] Referring Joanna Hall: Treating Jackolyn Geron/Extender: [name] [facility], [name] in Treatment: 7 HBO Treatment Course Details Treatment Course Number: 1 Ordering Madina Galati: [name] T Treatments Ordered: otal 40 HBO Treatment Start Date: 11/06/2021 HBO Indication: Diabetic Ulcer(s) of the Lower Extremity Standard/Conservative Wound Care tried and failed greater than or equal to 30 days Wound #1 Right, Medial T Great oe HBO Treatment Details Treatment Number: 22 Patient Type: Outpatient Chamber Type: Monoplace Chamber Serial #: [id number] Treatment Protocol: 2.0 ATA with 90 minutes oxygen, with two 5 minute air breaks Treatment Details Compression Rate Down: 2.0 psi / minute De-Compression Rate Up: 1.5 psi / minute A breaks and breathing ir Compress Tx Pressure periods Decompress Decompress Begins Reached (leave unused spaces Begins Ends blank) Chamber Pressure (ATA 1 2 2 2 2 2  --2 1 ) Clock Time (24 hr) 09:55 10:03 10:33 10:38 11:08 11:13 - - 11:43 11:49 Treatment Length: 114 (minutes) Treatment Segments: 4 Vital Signs Capillary Blood Glucose Reference Range: 80 - 120 mg / dl HBO Diabetic Blood Glucose Intervention Range: <131 mg/dl or >621 mg/dl Time Vitals Blood Respiratory Capillary Blood Glucose Pulse Action Type: Pulse: Temperature: Taken: Pressure: Rate: Glucose (mg/dl): Meter #: Oximetry (%) Taken: Pre 09:47 148/101 96 16 97.3 232 Post 11:54 139/94 81 16 98.1 213 Treatment Response Treatment Toleration: Well Treatment Completion Status: Treatment Completed without Adverse Event Additional Procedure  Documentation Tissue Sevierity: Necrosis of bone Electronic Signature(s) Signed: 12/12/2021 3:27:45 PM By: [name] EMT Signed: 12/12/2021 5:27:49 PM By: [name] DO Entered By: [name] on 12/12/2021 15:27:45 -------------------------------------------------------------------------------- HBO Safety Checklist Details Patient Name: Date of Service: [name] 12/12/2021 10:00 A M Medical Record Number: [id number] Patient Account Number: [id number] Date of Birth/Sex: Treating RN: May 01, 1966 (56 y.o. [name] Primary Care Dian [place]: [name] Other Clinician: [name] Referring Kyree Fedorko: Treating Javarri Segal/Extender: [name], [name] in Treatment: 7 HBO Safety Checklist Items Safety Checklist Consent Form Signed Patient voided / foley secured and emptied When did you last eato Last night 2300 Last dose of injectable or oral agent 0845 Ostomy pouch emptied and vented if applicable NA All implantable devices assessed, documented and approved NA Intravenous access site secured and place NA Valuables secured Linens and cotton and cotton/polyester blend (less than 51% polyester) Personal oil-based products / skin lotions / body lotions removed Wigs or hairpieces removed NA Smoking or tobacco materials removed Books / newspapers / magazines / loose paper removed Cologne, aftershave, perfume and deodorant removed Jewelry removed (may wrap wedding band) NA Make-up removed NA Hair care products removed Battery operated devices (external) removed Heating patches and chemical warmers removed Titanium eyewear removed NA Nail polish cured greater than 10 hours NA Casting material cured greater than 10 hours NA Hearing aids removed NA Loose dentures or partials removed NA Prosthetics have been removed NA Patient demonstrates correct use of air break device (if applicable) Patient concerns have been  addressed Patient grounding bracelet on and cord attached to chamber Specifics for Inpatients (complete in addition to above) Medication sheet sent with patient NA Intravenous medications needed or due during therapy sent with patient NA Drainage tubes (e.g. nasogastric tube or chest tube secured and vented)  NA Endotracheal or Tracheotomy tube secured NA [name] deflated of air and inflated with saline NA Airway suctioned NA Notes The safety checklist was done before treatment started. Electronic Signature(s) Signed: 12/12/2021 3:25:50 PM By: [name] EMT Entered By: [name] on 12/12/2021 15:25:49

## 2021-12-12 NOTE — Progress Notes (Signed)
[name], [name] ([id number]) Visit Report for 12/07/2021 Arrival Information Details Patient Name: Date of Service: [name] [facility] 12/07/2021 9:30 A M Medical Record Number: [id number] Patient Account Number: [id number] Date of Birth/Sex: Treating RN: 04-08-1966 (56 y.o. [name] Primary Care [name]: Clinic, [name] Other Clinician: Referring [name]: Treating [name]/Extender: [name] Clinic, [name] in Treatment: 7 Visit Information History Since Last Visit Added or deleted any medications: No Patient Arrived: Ambulatory Any new allergies or adverse reactions: No Arrival Time: 09:52 Had a fall or experienced change in No Accompanied By: self activities of daily living that may affect Transfer Assistance: None risk of falls: Patient Identification Verified: Yes Signs or symptoms of abuse/neglect since last visito No Secondary Verification Process Completed: Yes Hospitalized since last visit: No Patient Requires Transmission-Based Precautions: No Implantable device outside of the clinic excluding No Patient Has Alerts: No cellular tissue based products placed in the center since last visit: Has Dressing in Place as Prescribed: Yes Pain Present Now: Yes Electronic Signature(s) Signed: 12/12/2021 8:46:10 AM By: [name] Entered By: [name] on 12/07/2021 09:52:58 -------------------------------------------------------------------------------- Encounter Discharge Information Details Patient Name: Date of Service: [name], [name] 12/07/2021 9:30 A M Medical Record Number: [id number] Patient Account Number: [id number] Date of Birth/Sex: Treating RN: January 14, 1966 (56 y.o. [name] Primary Care [name]: Clinic, [name] Other Clinician: Referring [name]: Treating [name]/Extender: [name] Clinic, [name] in Treatment: 7 Encounter Discharge Information Items Post Procedure Vitals Discharge Condition:  Stable Temperature (F): 98.5 Ambulatory Status: Ambulatory Pulse (bpm): 101 Discharge Destination: Home Respiratory Rate (breaths/min): 18 Transportation: Private Auto Blood Pressure (mmHg): 131/84 Schedule Follow-up Appointment: Yes Clinical Summary of Care: Provided on 12/07/2021 Form Type Recipient Paper Patient Patient Electronic Signature(s) Signed: 12/07/2021 4:46:06 PM By: [name] Entered By: [name] on 12/07/2021 10:46:39 -------------------------------------------------------------------------------- Lower Extremity Assessment Details Patient Name: Date of Service: [name], [name] 12/07/2021 9:30 A M Medical Record Number: [id number] Patient Account Number: [id number] Date of Birth/Sex: Treating RN: 1965-07-14 (56 y.o. [name] Primary Care Areli Frary: Clinic, [name] Other Clinician: Referring [name]: Treating [name]/Extender: [name] Clinic, [name] in Treatment: 7 Edema Assessment Assessed: [Left: No] [Right: No] Edema: [Left: Ye] [Right: s] Calf Left: Right: Point of Measurement: 37 cm From Medial Instep 38.4 cm Ankle Left: Right: Point of Measurement: 9 cm From Medial Instep 23.5 cm Vascular Assessment Pulses: Dorsalis Pedis Palpable: [Right:Yes] Electronic Signature(s) Signed: 12/07/2021 4:46:06 PM By: [name] Signed: 12/12/2021 8:46:10 AM By: [name] Entered By: [name] on 12/07/2021 09:58:07 -------------------------------------------------------------------------------- Multi Wound Chart Details Patient Name: Date of Service: [name], [name] 12/07/2021 9:30 A M Medical Record Number: [id number] Patient Account Number: [id number] Date of Birth/Sex: Treating RN: 09/17/1965 (56 y.o. [name] Primary Care [name]: Clinic, [name] Other Clinician: Referring [name]: Treating [name]/Extender: [name] Clinic, [name] in Treatment: 7 Vital  Signs Height(in): 69 Pulse(bpm): 101 Weight(lbs): 180 Blood Pressure(mmHg): 131/84 Body Mass Index(BMI): 26.6 Temperature(F): 98.5 Respiratory Rate(breaths/min): 18 Photos: [1:Right, Medial T Great] [N/A:N/A oe N/A] Wound Location: [1:Gradually Appeared] [N/A:N/A] Wounding Event: [1:Diabetic Wound/Ulcer of the Lower] [N/A:N/A] Primary Etiology: [1:Extremity Hypertension, Type II Diabetes,] [N/A:N/A] Comorbid History: [1:Osteomyelitis, Neuropathy 10/10/2021] [N/A:N/A] Date Acquired: [1:7] [N/A:N/A] Weeks of Treatment: [1:Open] [N/A:N/A] Wound Status: [1:No] [N/A:N/A] Wound Recurrence: [1:0.2x0.3x0.3] [N/A:N/A] Measurements L x W x D (cm) [1:0.047] [N/A:N/A] A (cm) : rea [1:0.014] [N/A:N/A] Volume (cm) : [1:99.00%] [N/A:N/A] % Reduction in A rea: [1:99.60%] [N/A:N/A] % Reduction in Volume: [1:Grade  3] [N/A:N/A] Classification: [1:Medium] [N/A:N/A] Exudate A mount: [1:Serosanguineous] [N/A:N/A] Exudate Type: [1:red, brown] [N/A:N/A] Exudate Color: [1:Distinct, outline attached] [N/A:N/A] Wound Margin: [1:Large (67-100%)] [N/A:N/A] Granulation A mount: [1:Red, Pink] [N/A:N/A] Granulation Quality: [1:Small (1-33%)] [N/A:N/A] Necrotic A mount: [1:Fat Layer (Subcutaneous Tissue): Yes N/A] Exposed Structures: [1:Fascia: No Tendon: No Muscle: No Joint: No Bone: No Medium (34-66%)] [N/A:N/A] Treatment Notes Electronic Signature(s) Signed: 12/07/2021 4:23:09 PM By: Christian Ham MD Signed: 12/07/2021 4:46:06 PM By: [name] Entered By: [name] on 12/07/2021 10:30:35 -------------------------------------------------------------------------------- Multi-Disciplinary Care Plan Details Patient Name: Date of Service: [name], [name] 12/07/2021 9:30 A M Medical Record Number: [id number] Patient Account Number: [id number] Date of Birth/Sex: Treating RN: August 16, 1965 (56 y.o. [name] Primary Care : Clinic, [name] Other Clinician: Referring  : Treating /Extender: [name] Clinic, [name] in Treatment: 7 Active Inactive Wound/Skin Impairment Nursing Diagnoses: Impaired tissue integrity Knowledge deficit related to ulceration/compromised skin integrity Goals: Patient will demonstrate a reduced rate of smoking or cessation of smoking Date Initiated: 10/19/2021 Target Resolution Date: 01/25/2022 Goal Status: Active Patient/caregiver will verbalize understanding of skin care regimen Date Initiated: 10/19/2021 Target Resolution Date: 01/25/2022 Goal Status: Active Ulcer/skin breakdown will have a volume reduction of 30% by week 4 Date Initiated: 10/19/2021 Date Inactivated: 10/31/2021 Target Resolution Date: 11/02/2021 Goal Status: Met Ulcer/skin breakdown will have a volume reduction of 50% by week 8 Date Initiated: 10/31/2021 Date Inactivated: 12/07/2021 Target Resolution Date: 11/28/2021 Goal Status: Met Interventions: Assess patient/caregiver ability to obtain necessary supplies Assess patient/caregiver ability to perform ulcer/skin care regimen upon admission and as needed Assess ulceration(s) every visit Provide education on smoking Notes: Electronic Signature(s) Signed: 12/07/2021 10:10:44 AM By: [name] Previous Signature: 12/07/2021 10:09:34 AM Version By: [name] Entered By: [name] on 12/07/2021 10:10:43 -------------------------------------------------------------------------------- Pain Assessment Details Patient Name: Date of Service: [name], [name] 12/07/2021 9:30 A M Medical Record Number: [id number] Patient Account Number: [id number] Date of Birth/Sex: Treating RN: Dec 05, 1965 (56 y.o. [name] Primary Care : Clinic, [name] Other Clinician: Referring : Treating /Extender: [name] Clinic, [name] in Treatment: 7 Active Problems Location of Pain Severity and Description of Pain Patient Has Paino  Yes Site Locations Pain Location: Generalized Pain, Pain in Ulcers Rate the pain. Current Pain Level: 8 Pain Management and Medication Current Pain Management: Medication: Yes Electronic Signature(s) Signed: 12/07/2021 4:46:06 PM By: [name] Signed: 12/12/2021 8:46:10 AM By: [name] Entered By: [name] on 12/07/2021 09:53:35 -------------------------------------------------------------------------------- Patient/Caregiver Education Details Patient Name: Date of Service: [name], Breathitt [name] 6/8/2023andnbsp9:30 [place] Record Number: [id number] Patient Account Number: [id number] Date of Birth/Gender: Treating RN: August 23, 1965 (56 y.o. [name] Primary Care Physician: Clinic, [name] Other Clinician: Referring Physician: Treating Physician/Extender: [name] Clinic, [name] in Treatment: 7 Education Assessment Education Provided To: Patient Education Topics Provided Elevated Blood Sugar/ Impact on Healing: Methods: Explain/Verbal, Printed Responses: State content correctly Offloading: Methods: Explain/Verbal, Printed Responses: State content correctly Smoking and Wound Healing: Methods: Explain/Verbal, Printed Responses: State content correctly Wound/Skin Impairment: Methods: Explain/Verbal, Printed Responses: State content correctly Electronic Signature(s) Signed: 12/07/2021 4:46:06 PM By: [name] Entered By: [name] on 12/07/2021 10:10:01 -------------------------------------------------------------------------------- Wound Assessment Details Patient Name: Date of Service: [name], [name] 12/07/2021 9:30 A M Medical Record Number: [id number] Patient Account Number: [id number] Date of Birth/Sex: Treating RN: 1965-09-25 (56 y.o. [name] Primary Care : Clinic, [name] Other Clinician: Referring : Treating /Extender: Marylu Lund, [name]  [name], [name] ([id number]) Visit Report for 12/07/2021 Arrival Information Details Patient Name: Date of Service: [name] [facility] 12/07/2021 9:30 A M Medical Record Number: [id number] Patient Account Number: [id number] Date of Birth/Sex: Treating RN: 04-08-1966 (56 y.o. [name] Primary Care [name]: Clinic, [name] Other Clinician: Referring [name]: Treating [name]/Extender: [name] Clinic, [name] in Treatment: 7 Visit Information History Since Last Visit Added or deleted any medications: No Patient Arrived: Ambulatory Any new allergies or adverse reactions: No Arrival Time: 09:52 Had a fall or experienced change in No Accompanied By: self activities of daily living that may affect Transfer Assistance: None risk of falls: Patient Identification Verified: Yes Signs or symptoms of abuse/neglect since last visito No Secondary Verification Process Completed: Yes Hospitalized since last visit: No Patient Requires Transmission-Based Precautions: No Implantable device outside of the clinic excluding No Patient Has Alerts: No cellular tissue based products placed in the center since last visit: Has Dressing in Place as Prescribed: Yes Pain Present Now: Yes Electronic Signature(s) Signed: 12/12/2021 8:46:10 AM By: [name] Entered By: [name] on 12/07/2021 09:52:58 -------------------------------------------------------------------------------- Encounter Discharge Information Details Patient Name: Date of Service: [name], [name] 12/07/2021 9:30 A M Medical Record Number: [id number] Patient Account Number: [id number] Date of Birth/Sex: Treating RN: January 14, 1966 (56 y.o. [name] Primary Care [name]: Clinic, [name] Other Clinician: Referring [name]: Treating [name]/Extender: [name] Clinic, [name] in Treatment: 7 Encounter Discharge Information Items Post Procedure Vitals Discharge Condition:  Stable Temperature (F): 98.5 Ambulatory Status: Ambulatory Pulse (bpm): 101 Discharge Destination: Home Respiratory Rate (breaths/min): 18 Transportation: Private Auto Blood Pressure (mmHg): 131/84 Schedule Follow-up Appointment: Yes Clinical Summary of Care: Provided on 12/07/2021 Form Type Recipient Paper Patient Patient Electronic Signature(s) Signed: 12/07/2021 4:46:06 PM By: [name] Entered By: [name] on 12/07/2021 10:46:39 -------------------------------------------------------------------------------- Lower Extremity Assessment Details Patient Name: Date of Service: [name], [name] 12/07/2021 9:30 A M Medical Record Number: [id number] Patient Account Number: [id number] Date of Birth/Sex: Treating RN: 1965-07-14 (56 y.o. [name] Primary Care Areli Frary: Clinic, [name] Other Clinician: Referring [name]: Treating [name]/Extender: [name] Clinic, [name] in Treatment: 7 Edema Assessment Assessed: [Left: No] [Right: No] Edema: [Left: Ye] [Right: s] Calf Left: Right: Point of Measurement: 37 cm From Medial Instep 38.4 cm Ankle Left: Right: Point of Measurement: 9 cm From Medial Instep 23.5 cm Vascular Assessment Pulses: Dorsalis Pedis Palpable: [Right:Yes] Electronic Signature(s) Signed: 12/07/2021 4:46:06 PM By: [name] Signed: 12/12/2021 8:46:10 AM By: [name] Entered By: [name] on 12/07/2021 09:58:07 -------------------------------------------------------------------------------- Multi Wound Chart Details Patient Name: Date of Service: [name], [name] 12/07/2021 9:30 A M Medical Record Number: [id number] Patient Account Number: [id number] Date of Birth/Sex: Treating RN: 09/17/1965 (56 y.o. [name] Primary Care [name]: Clinic, [name] Other Clinician: Referring [name]: Treating [name]/Extender: [name] Clinic, [name] in Treatment: 7 Vital  Signs Height(in): 69 Pulse(bpm): 101 Weight(lbs): 180 Blood Pressure(mmHg): 131/84 Body Mass Index(BMI): 26.6 Temperature(F): 98.5 Respiratory Rate(breaths/min): 18 Photos: [1:Right, Medial T Great] [N/A:N/A oe N/A] Wound Location: [1:Gradually Appeared] [N/A:N/A] Wounding Event: [1:Diabetic Wound/Ulcer of the Lower] [N/A:N/A] Primary Etiology: [1:Extremity Hypertension, Type II Diabetes,] [N/A:N/A] Comorbid History: [1:Osteomyelitis, Neuropathy 10/10/2021] [N/A:N/A] Date Acquired: [1:7] [N/A:N/A] Weeks of Treatment: [1:Open] [N/A:N/A] Wound Status: [1:No] [N/A:N/A] Wound Recurrence: [1:0.2x0.3x0.3] [N/A:N/A] Measurements L x W x D (cm) [1:0.047] [N/A:N/A] A (cm) : rea [1:0.014] [N/A:N/A] Volume (cm) : [1:99.00%] [N/A:N/A] % Reduction in A rea: [1:99.60%] [N/A:N/A] % Reduction in Volume: [1:Grade

## 2021-12-12 NOTE — Progress Notes (Signed)
CZAR, YSAGUIRRE ([id number]) Visit Report for 12/12/2021 Problem List Details Patient Name: Date of Service: [name] [facility] 12/12/2021 10:00 A M Medical Record Number: [id number] Patient Account Number: [id number] Date of Birth/Sex: Treating RN: 08-10-1965 (56 y.o. [name] Primary Care Provider: Clinic, [name] Other Clinician: [name] Referring Provider: Treating Provider/Extender: [name] Clinic, [name] in Treatment: 7 Active Problems ICD-10 Encounter Code Description Active Date MDM Diagnosis E11.621 Type 2 diabetes mellitus with foot ulcer 10/19/2021 No Yes L97.514 Non-pressure chronic ulcer of other part of right foot with necrosis of bone 10/19/2021 No Yes M86.071 Acute hematogenous osteomyelitis, right ankle and foot 10/19/2021 No Yes Inactive Problems Resolved Problems Electronic Signature(s) Signed: 12/12/2021 3:28:16 PM By: [name] EMT Signed: 12/12/2021 5:27:49 PM By: [name] DO Entered By: [name] on 12/12/2021 15:28:15 -------------------------------------------------------------------------------- SuperBill Details Patient Name: Date of Service: [name] RD, [name] 12/12/2021 Medical Record Number: [id number] Patient Account Number: [id number] Date of Birth/Sex: Treating RN: 08-16-1965 (56 y.o. 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.071 Acute hematogenous osteomyelitis, right ankle and foot Facility Procedures CPT4 Code: [id number] Description: G0277-(Facility Use Only) HBOT full body chamber, , ICD-10 Diagnosis Description E11.621 Type 2 diabetes mellitus  with foot ulcer L97.514 Non-pressure chronic ulcer of other part of right foot with necrosis of bone M86.071 Acute  hematogenous osteomyelitis, right ankle and foot Modifier: Quantity: 4 Physician Procedures : CPT4 Code Description Modifier [id number] - WC PHYS HYPERBARIC OXYGEN THERAPY ICD-10 Diagnosis Description E11.621 Type 2 diabetes mellitus with foot ulcer L97.514 Non-pressure chronic ulcer of other part of right foot with necrosis of bone M86.071  Acute hematogenous osteomyelitis, right ankle and foot Quantity: 1 Electronic Signature(s) Signed: 12/12/2021 3:28:10 PM By: [name] EMT Signed: 12/12/2021 5:27:49 PM By: [name] DO Entered By: [name] on 12/12/2021 15:28:09

## 2021-12-12 NOTE — Progress Notes (Addendum)
[name], [name] ([id number]) Visit Report for 12/12/2021 Arrival Information Details Patient Name: Date of Service: [name] [facility] 12/12/2021 10:00 A M Medical Record Number: [id number] Patient Account Number: [id number] Date of Birth/Sex: Treating RN: July 08, 1965 (56 y.o. [name] Primary Care Ihsan Nomura: Clinic, [name] Other Clinician: [name] Referring Karmella Bouvier: Treating Majestic Molony/Extender: [name] Clinic, [name] in Treatment: 7 Visit Information History Since Last Visit All ordered tests and consults were completed: Yes Patient Arrived: Ambulatory Added or deleted any medications: No Arrival Time: 09:33 Any new allergies or adverse reactions: No Accompanied By: None Had a fall or experienced change in No Transfer Assistance: None activities of daily living that may affect Patient Identification Verified: Yes risk of falls: Secondary Verification Process Completed: Yes Signs or symptoms of abuse/neglect since last visito No Patient Requires Transmission-Based Precautions: No Hospitalized since last visit: No Patient Has Alerts: No Implantable device outside of the clinic excluding No cellular tissue based products placed in the center since last visit: Pain Present Now: No Notes Safety checklist was done before treatment started. Electronic Signature(s) Signed: 12/12/2021 3:00:25 PM By: [name] EMT Entered By: [name] on 12/12/2021 15:00:25 -------------------------------------------------------------------------------- Encounter Discharge Information Details Patient Name: Date of Service: [name], [name] 12/12/2021 10:00 A M Medical Record Number: [id number] Patient Account Number: [id number] Date of Birth/Sex: Treating RN: 06/15/1966 (56 y.o. [name] Primary Care Raymund Manrique: Clinic, [name] Other Clinician: [name] Referring Vannie Hilgert: Treating Roselyne Stalnaker/Extender: [name] [facility],  [name] in Treatment: 7 Encounter Discharge Information Items Discharge Condition: Stable Ambulatory Status: Ambulatory Discharge Destination: Home Transportation: Private Auto Accompanied By: None Schedule Follow-up Appointment: Yes Clinical Summary of Care: Electronic Signature(s) Signed: 12/12/2021 3:28:57 PM By: [name] EMT Entered By: [name] on 12/12/2021 15:28:57 -------------------------------------------------------------------------------- Vitals Details Patient Name: Date of Service: [name], [name] 12/12/2021 10:00 A M Medical Record Number: [id number] Patient Account Number: [id number] Date of Birth/Sex: Treating RN: 1966/03/23 (56 y.o. [name] Primary Care Wilsie Kern: Clinic, [name] Other Clinician: [name] Referring Nickalus Thornsberry: Treating Arrin Pintor/Extender: [name] [facility], [name] in Treatment: 7 Vital Signs Time Taken: 09:47 Temperature (F): 97.3 Height (in): 69 Pulse (bpm): 96 Weight (lbs): 180 Respiratory Rate (breaths/min): 16 Body Mass Index (BMI): 26.6 Blood Pressure (mmHg): 148/101 Capillary Blood Glucose (mg/dl): 892 Reference Range: 80 - 120 mg / dl Electronic Signature(s) Signed: 12/12/2021 3:15:19 PM By: [name] EMT Entered By: [name] on 12/12/2021 15:15:18

## 2021-12-13 ENCOUNTER — Encounter (HOSPITAL_BASED_OUTPATIENT_CLINIC_OR_DEPARTMENT_OTHER): Payer: No Typology Code available for payment source | Admitting: General Surgery

## 2021-12-13 DIAGNOSIS — E11621 Type 2 diabetes mellitus with foot ulcer: Secondary | ICD-10-CM | POA: Diagnosis not present

## 2021-12-13 LAB — GLUCOSE, CAPILLARY
Glucose-Capillary: 220 mg/dL — ABNORMAL HIGH (ref 70–99)
Glucose-Capillary: 175 mg/dL — ABNORMAL HIGH (ref 70–99)

## 2021-12-13 NOTE — Progress Notes (Signed)
VERLE, WHEELING ([id number]) Visit Report for 12/13/2021 Problem List Details Patient Name: Date of Service: [name] [facility] 12/13/2021 10:00 A M Medical Record Number: [id number] Patient Account Number: [id number] Date of Birth/Sex: Treating RN: [birth year] (56 y.o. [name] Primary Care Provider: Clinic, [name] Other Clinician: [name] Referring Provider: Treating Provider/Extender: [name] Clinic, [name] in Treatment: 7 Active Problems ICD-10 Encounter Code Description Active Date MDM Diagnosis E11.621 Type 2 diabetes mellitus with foot ulcer 10/19/2021 No Yes L97.514 Non-pressure chronic ulcer of other part of right foot with necrosis of bone 10/19/2021 No Yes M86.071 Acute hematogenous osteomyelitis, right ankle and foot 10/19/2021 No Yes Inactive Problems Resolved Problems Electronic Signature(s) Signed: 12/13/2021 5:07:13 PM By: [name] EMT Signed: 12/13/2021 5:18:47 PM By: [name] MD FACS Entered By: [name] on 12/13/2021 17:07:13 -------------------------------------------------------------------------------- SuperBill Details Patient Name: Date of Service: [name], [name] 12/13/2021 Medical Record Number: [id number] Patient Account Number: [id number] Date of Birth/Sex: Treating RN: 03-15-1966 (56 y.o. 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.071 Acute hematogenous osteomyelitis, right ankle and foot Facility Procedures CPT4 Code: [id number] Description: G0277-(Facility Use Only) HBOT full body chamber, , ICD-10 Diagnosis Description E11.621 Type 2 diabetes  mellitus with foot ulcer L97.514 Non-pressure chronic ulcer of other part of right foot with necrosis of bone M86.071 Acute  hematogenous osteomyelitis, right ankle and foot Modifier: Quantity: 4 Physician Procedures : CPT4 Code Description Modifier [id number] - WC PHYS HYPERBARIC OXYGEN THERAPY ICD-10 Diagnosis Description E11.621 Type 2 diabetes mellitus with foot ulcer L97.514 Non-pressure chronic ulcer of other part of right foot with necrosis of bone M86.071  Acute hematogenous osteomyelitis, right ankle and foot Quantity: 1 Electronic Signature(s) Signed: 12/13/2021 5:07:04 PM By: [name] EMT Signed: 12/13/2021 5:18:47 PM By: [name] MD FACS Entered By: [name] on 12/13/2021 17:07:04

## 2021-12-13 NOTE — Progress Notes (Signed)
PRENTIS, LANGDON ([id number]) Visit Report for 12/13/2021 Arrival Information Details Patient Name: Date of Service: [name] [facility] 12/13/2021 10:00 A M Medical Record Number: [id number] Patient Account Number: [id number] Date of Birth/Sex: Treating RN: August 28, 1965 (56 y.o. [name] Primary Care Kailey Esquilin: Clinic, [name] Other Clinician: [name] Referring Fayne Mcguffee: Treating Day Greb/Extender: [name] Clinic, [name] in Treatment: 7 Visit Information History Since Last Visit All ordered tests and consults were completed: Yes Patient Arrived: Ambulatory Added or deleted any medications: No Arrival Time: 10:00 Any new allergies or adverse reactions: No Accompanied By: None Had a fall or experienced change in No Transfer Assistance: None activities of daily living that may affect Patient Identification Verified: Yes risk of falls: Secondary Verification Process Completed: Yes Signs or symptoms of abuse/neglect since last visito No Patient Requires Transmission-Based Precautions: No Hospitalized since last visit: No Patient Has Alerts: No Implantable device outside of the clinic excluding No cellular tissue based products placed in the center since last visit: Pain Present Now: No Electronic Signature(s) Signed: 12/13/2021 5:03:30 PM By: [name] EMT Entered By: [name] on 12/13/2021 17:03:30 -------------------------------------------------------------------------------- Encounter Discharge Information Details Patient Name: Date of Service: [name], [name] 12/13/2021 10:00 A M Medical Record Number: [id number] Patient Account Number: [id number] Date of Birth/Sex: Treating RN: 05/15/1966 (56 y.o. [name] Primary Care Townes Fuhs: Clinic, [name] Other Clinician: [name] Referring Georgi Tuel: Treating Tiawana Forgy/Extender: [name], [name] in Treatment: 7 Encounter Discharge  Information Items Discharge Condition: Stable Ambulatory Status: Ambulatory Discharge Destination: Home Transportation: Private Auto Accompanied By: None Schedule Follow-up Appointment: Yes Clinical Summary of Care: Electronic Signature(s) Signed: 12/13/2021 5:07:49 PM By: [name] EMT Entered By: [name] on 12/13/2021 17:07:49 -------------------------------------------------------------------------------- Vitals Details Patient Name: Date of Service: [name], [name] 12/13/2021 10:00 A M Medical Record Number: [id number] Patient Account Number: [id number] Date of Birth/Sex: Treating RN: Dec 21, 1965 (56 y.o. [name] Primary Care Ia Leeb: [facility], [name] Other Clinician: [name] Referring Akita Maxim: Treating Josephyne Tarter/Extender: [name], [name] in Treatment: 7 Vital Signs Time Taken: 10:16 Temperature (F): 97.9 Height (in): 69 Pulse (bpm): 91 Weight (lbs): 180 Respiratory Rate (breaths/min): 16 Body Mass Index (BMI): 26.6 Blood Pressure (mmHg): 155/90 Capillary Blood Glucose (mg/dl): 431 Reference Range: 80 - 120 mg / dl Electronic Signature(s) Signed: 12/13/2021 5:03:58 PM By: [name] EMT Entered By: [name] on 12/13/2021 17:03:58

## 2021-12-13 NOTE — Progress Notes (Addendum)
Christian, [name] ([id number]) Visit Report for 12/13/2021 HBO Details Patient Name: Date of Service: [name] 12/13/2021 10:00 A M Medical Record Number: [id number] Patient Account Number: [id number] Date of Birth/Sex: Treating RN: 1966/06/07 (56 y.o. [name] Primary Care [name]: Clinic, [name] Other Clinician: Karl [name] Referring [name]: Treating [name]/Extender: [name] Clinic, [name] in Treatment: 7 HBO Treatment Course Details Treatment Course Number: 1 Ordering [name]: [name] T Treatments Ordered: otal 40 HBO Treatment Start 11/06/2021 Date: HBO Indication: Diabetic Ulcer(s) of the Lower Extremity HBO Treatment End 12/13/2021 Date: Standard/Conservative Wound Care tried and failed greater than or equal to 30 days HBO Discharge Treatment Series Incomplete; Wound Progress Outcome: Plateaued Wound #1 Right, Medial T Great oe HBO Treatment Details Treatment Number: 23 Patient Type: Outpatient Chamber Type: Monoplace Chamber Serial #: [id number] Treatment Protocol: 2.0 ATA with 90 minutes oxygen, with two 5 minute air breaks Treatment Details Compression Rate Down: 2.0 psi / minute De-Compression Rate Up: 2.0 psi / minute A breaks and breathing ir Compress Tx Pressure periods Decompress Decompress Begins Reached (leave unused spaces Begins Ends blank) Chamber Pressure (ATA 1 2 2 2 2 2  --2 1 ) Clock Time (24 hr) 10:20 10:30 11:00 11:05 11:35 11:40 - - 12:10 12:18 Treatment Length: 118 (minutes) Treatment Segments: 4 Vital Signs Capillary Blood Glucose Reference Range: 80 - 120 mg / dl HBO Diabetic Blood Glucose Intervention Range: <131 mg/dl or >528 mg/dl Time Vitals Blood Respiratory Capillary Blood Glucose Pulse Action Type: Pulse: Temperature: Taken: Pressure: Rate: Glucose (mg/dl): Meter #: Oximetry (%) Taken: Pre 10:16 155/90 91 16 97.9 220 Post 12:24 154/97 92 16 97.9 175 Treatment  Response Treatment Toleration: Well Treatment Completion Status: Treatment Completed without Adverse Event Additional Procedure Documentation Tissue Sevierity: Necrosis of bone Physician HBO Attestation: I certify that I supervised this HBO treatment in accordance with Medicare guidelines. A trained emergency response team is readily available per Yes hospital policies and procedures. Continue HBOT as ordered. Yes Electronic Signature(s) Signed: 12/14/2021 10:19:48 AM By: [name] EMT Signed: 12/14/2021 10:57:41 AM By: Christian Guess MD FACS Previous Signature: 12/13/2021 5:20:05 PM Version By: Christian Guess MD FACS Previous Signature: 12/13/2021 5:06:41 PM Version By: [name] EMT Entered By: [name] on 12/14/2021 10:19:47 -------------------------------------------------------------------------------- HBO Safety Checklist Details Patient Name: Date of Service: [name] 12/13/2021 10:00 A M Medical Record Number: [id number] Patient Account Number: [id number] Date of Birth/Sex: Treating RN: [name]: Clinic, [name] Other Clinician: Karl [name] Referring [name]: Treating [name]/Extender: [name] Clinic, [name] in Treatment: 7 HBO Safety Checklist Items Safety Checklist Consent Form Signed Patient voided / foley secured and emptied When did you last eato 0945 Last dose of injectable or oral agent 0845 Ostomy pouch emptied and vented if applicable NA All implantable devices assessed, documented and approved NA Intravenous access site secured and place NA Valuables secured Linens and cotton and cotton/polyester blend (less than 51% polyester) Personal oil-based products / skin lotions / body lotions removed Wigs or hairpieces removed NA Smoking or tobacco materials removed Books / newspapers / magazines / loose paper removed Cologne, aftershave, perfume and  deodorant removed Jewelry removed (may wrap wedding band) NA Make-up removed NA Hair care products removed Battery operated devices (external) removed Heating patches and chemical warmers removed Titanium eyewear removed NA Frames approver by [employer] Nail polish cured greater than 10 hours NA Casting material cured greater than 10 hours NA Hearing  aids removed NA Loose dentures or partials removed NA Prosthetics have been removed Patient demonstrates correct use of air break device (if applicable) Patient concerns have been addressed Patient grounding bracelet on and cord attached to chamber Specifics for Inpatients (complete in addition to above) Medication sheet sent with patient NA Intravenous medications needed or due during therapy sent with patient NA Drainage tubes (e.g. nasogastric tube or chest tube secured and vented) NA Endotracheal or Tracheotomy tube secured NA Cuff deflated of air and inflated with saline NA Airway suctioned NA Notes Safety checklist was done before treatment started. Electronic Signature(s) Signed: 12/13/2021 5:05:09 PM By: [name] EMT Entered By: [name] on 12/13/2021 17:05:09

## 2021-12-14 ENCOUNTER — Encounter (HOSPITAL_BASED_OUTPATIENT_CLINIC_OR_DEPARTMENT_OTHER): Payer: Self-pay

## 2021-12-14 ENCOUNTER — Encounter (HOSPITAL_BASED_OUTPATIENT_CLINIC_OR_DEPARTMENT_OTHER): Payer: No Typology Code available for payment source | Admitting: General Surgery

## 2021-12-14 ENCOUNTER — Encounter (HOSPITAL_BASED_OUTPATIENT_CLINIC_OR_DEPARTMENT_OTHER): Payer: No Typology Code available for payment source | Admitting: Internal Medicine

## 2021-12-14 DIAGNOSIS — L97514 Non-pressure chronic ulcer of other part of right foot with necrosis of bone: Secondary | ICD-10-CM

## 2021-12-14 DIAGNOSIS — M86071 Acute hematogenous osteomyelitis, right ankle and foot: Secondary | ICD-10-CM

## 2021-12-14 DIAGNOSIS — E11621 Type 2 diabetes mellitus with foot ulcer: Secondary | ICD-10-CM | POA: Diagnosis not present

## 2021-12-14 NOTE — Progress Notes (Signed)
JORDEN, KNOPE ([id number]) Visit Report for 11/09/2021 Arrival Information Details Patient Name: Date of Service: [name] [facility] 11/09/2021 12:30 PM Medical Record Number: [id number] Patient Account Number: [id number] Date of Birth/Sex: Treating RN: [birth year] (56 y.o. Lytle Michaels Primary Care Tanis Hensarling: Clinic, [name] Other Clinician: Referring Brinden Kincheloe: Treating Esther Broyles/Extender: [name] Clinic, [name] in Treatment: 3 Visit Information History Since Last Visit Added or deleted any medications: No Patient Arrived: Ambulatory Any new allergies or adverse reactions: No Arrival Time: 12:46 Had a fall or experienced change in No Accompanied By: self activities of daily living that may affect Transfer Assistance: None risk of falls: Patient Requires Transmission-Based Precautions: No Signs or symptoms of abuse/neglect since last visito No Patient Has Alerts: No Hospitalized since last visit: No Implantable device outside of the clinic excluding No cellular tissue based products placed in the center since last visit: Has Dressing in Place as Prescribed: Yes Has Compression in Place as Prescribed: Yes Pain Present Now: Yes Electronic Signature(s) Signed: 11/10/2021 2:51:54 PM By: Fonnie Mu RN Entered By: Fonnie Mu on 11/09/2021 13:27:55 -------------------------------------------------------------------------------- Encounter Discharge Information Details Patient Name: Date of Service: [name], [name] 11/09/2021 12:30 PM Medical Record Number: [id number] Patient Account Number: [id number] Date of Birth/Sex: Treating RN: 20-Jul-1965 (56 y.o. Lucious Groves Primary Care Linsi Humann: [facility], [name] Other Clinician: Referring Emmerich Cryer: Treating Jalon Blackwelder/Extender: [name], [name] in Treatment: 3 Encounter Discharge Information Items Post Procedure Vitals Discharge Condition: Stable Temperature (F):  98.7 Ambulatory Status: Ambulatory Pulse (bpm): 74 Discharge Destination: Home Respiratory Rate (breaths/min): 17 Transportation: Private Auto Blood Pressure (mmHg): 134/74 Accompanied By: self Schedule Follow-up Appointment: Yes Clinical Summary of Care: Patient Declined Electronic Signature(s) Signed: 11/10/2021 2:51:54 PM By: Fonnie Mu RN Entered By: Fonnie Mu on 11/09/2021 13:56:31 -------------------------------------------------------------------------------- Lower Extremity Assessment Details Patient Name: Date of Service: [name] RDAlvino Chapel [facility] 11/09/2021 12:30 PM Medical Record Number: [id number] Patient Account Number: [id number] Date of Birth/Sex: Treating RN: 07/24/1965 (56 y.o. Lytle Michaels Primary Care Quran Vasco: Clinic, [name] Other Clinician: Referring Uriah Philipson: Treating Etheline Geppert/Extender: [name], [name] in Treatment: 3 Edema Assessment Assessed: [Left: No] [Right: Yes] Edema: [Left: Ye] [Right: s] Calf Left: Right: Point of Measurement: 37 cm From Medial Instep 38.5 cm Ankle Left: Right: Point of Measurement: 9 cm From Medial Instep 25.7 cm Vascular Assessment Pulses: Dorsalis Pedis Palpable: [Right:Yes] Posterior Tibial Palpable: [Right:Yes] Electronic Signature(s) Signed: 11/09/2021 4:36:14 PM By: Antonieta Iba Signed: 12/12/2021 8:46:54 AM By: Thayer Dallas Entered By: Thayer Dallas on 11/09/2021 13:14:40 -------------------------------------------------------------------------------- Multi Wound Chart Details Patient Name: Date of Service: [name], [name] 11/09/2021 12:30 PM Medical Record Number: [id number] Patient Account Number: [id number] Date of Birth/Sex: Treating RN: 1965-11-27 (56 y.o. Lytle Michaels Primary Care Ramsay Bognar: Clinic, [name] Other Clinician: Referring Esbeidy Mclaine: Treating Adyson Vanburen/Extender: [name], [name] in Treatment: 3 Vital  Signs Height(in): 69 Pulse(bpm): 96 Weight(lbs): 180 Blood Pressure(mmHg): 146/87 Body Mass Index(BMI): 26.6 Temperature(F): 98.1 Respiratory Rate(breaths/min): 18 Photos: [1:Right, Medial T Great oe] [N/A:N/A N/A] Wound Location: [1:Gradually Appeared] [N/A:N/A] Wounding Event: [1:Diabetic Wound/Ulcer of the Lower] [N/A:N/A] Primary Etiology: [1:Extremity Hypertension, Type II Diabetes,] [N/A:N/A] Comorbid History: [1:Osteomyelitis, Neuropathy 10/10/2021] [N/A:N/A] Date Acquired: [1:3] [N/A:N/A] Weeks of Treatment: [1:Open] [N/A:N/A] Wound Status: [1:No] [N/A:N/A] Wound Recurrence: [1:1.1x1.7x0.3] [N/A:N/A] Measurements L x W x D (cm) [1:1.469] [N/A:N/A] A (cm) : rea [1:0.441] [N/A:N/A] Volume (cm) : [1:68.80%] [N/A:N/A] % Reduction in A [1:rea: 88.30%] [N/A:N/A] % Reduction in Volume: [1:Grade 3] [N/A:N/A] Classification: [1:Medium] [  N/A:N/A] Exudate A mount: [1:Serosanguineous] [N/A:N/A] Exudate Type: [1:red, brown] [N/A:N/A] Exudate Color: [1:Medium (34-66%)] [N/A:N/A] Granulation A mount: [1:Medium (34-66%)] [N/A:N/A] Necrotic A mount: [1:None] [N/A:N/A] Epithelialization: [1:Debridement - Excisional] [N/A:N/A] Debridement: Pre-procedure Verification/Time Out 13:17 [N/A:N/A] Taken: [1:Lidocaine] [N/A:N/A] Pain Control: [1:Subcutaneous, Slough] [N/A:N/A] Tissue Debrided: [1:Skin/Subcutaneous Tissue] [N/A:N/A] Level: [1:1.87] [N/A:N/A] Debridement A (sq cm): [1:rea Blade, Forceps] [N/A:N/A] Instrument: [1:Minimum] [N/A:N/A] Bleeding: [1:Pressure] [N/A:N/A] Hemostasis A chieved: [1:0] [N/A:N/A] Procedural Pain: [1:0] [N/A:N/A] Post Procedural Pain: [1:Procedure was tolerated well] [N/A:N/A] Debridement Treatment Response: [1:1.1x1.7x0.3] [N/A:N/A] Post Debridement Measurements L x W x D (cm) [1:0.441] [N/A:N/A] Post Debridement Volume: (cm) [1:Debridement] [N/A:N/A] Treatment Notes Wound #1 (Toe Great) Wound Laterality: Right, Medial Cleanser Soap and  Water Discharge Instruction: May shower and wash wound with dial antibacterial soap and water prior to dressing change. Wound Cleanser Discharge Instruction: Cleanse the wound with wound cleanser prior to applying a clean dressing using gauze sponges, not tissue or cotton balls. Peri-Wound Care Topical Primary Dressing Dakin's Solution 0.25%, 16 (oz) Discharge Instruction: Moisten gauze with Dakin's solution Secondary Dressing Bordered Gauze, 4x4 in Discharge Instruction: Apply over primary dressing as directed. Secured With Conforming Stretch Gauze Bandage, Sterile 2x75 (in/in) Discharge Instruction: Secure with stretch gauze as directed. 62M Medipore H Soft Cloth Surgical T ape, 4 x 10 (in/yd) Discharge Instruction: Secure with tape as directed. Compression Wrap Compression Stockings Add-Ons Electronic Signature(s) Signed: 11/09/2021 4:21:10 PM By: [name] DO Signed: 11/09/2021 4:36:14 PM By: Antonieta Iba Entered By: [name] on 11/09/2021 15:08:13 -------------------------------------------------------------------------------- Multi-Disciplinary Care Plan Details Patient Name: Date of Service: [name], [name] 11/09/2021 12:30 PM Medical Record Number: [id number] Patient Account Number: [id number] Date of Birth/Sex: Treating RN: 1965-09-08 (56 y.o. Lytle Michaels Primary Care Corrinna Karapetyan: Clinic, [name] Other Clinician: Referring Nilsa Macht: Treating Ruble Pumphrey/Extender: [name], [name] in Treatment: 3 Active Inactive Wound/Skin Impairment Nursing Diagnoses: Impaired tissue integrity Knowledge deficit related to ulceration/compromised skin integrity Goals: Patient will demonstrate a reduced rate of smoking or cessation of smoking Date Initiated: 10/19/2021 Target Resolution Date: 12/02/2021 Goal Status: Active Patient/caregiver will verbalize understanding of skin care regimen Date Initiated: 10/19/2021 Target Resolution  Date: 12/02/2021 Goal Status: Active Ulcer/skin breakdown will have a volume reduction of 30% by week 4 Date Initiated: 10/19/2021 Date Inactivated: 10/31/2021 Target Resolution Date: 11/02/2021 Goal Status: Met Ulcer/skin breakdown will have a volume reduction of 50% by week 8 Date Initiated: 10/31/2021 Target Resolution Date: 11/28/2021 Goal Status: Active Interventions: Assess patient/caregiver ability to obtain necessary supplies Assess patient/caregiver ability to perform ulcer/skin care regimen upon admission and as needed Assess ulceration(s) every visit Provide education on smoking Notes: Electronic Signature(s) Signed: 11/09/2021 4:36:14 PM By: Antonieta Iba Signed: 12/12/2021 8:46:54 AM By: Thayer Dallas Entered By: Thayer Dallas on 11/09/2021 13:20:47 -------------------------------------------------------------------------------- Pain Assessment Details Patient Name: Date of Service: [name], [name] 11/09/2021 12:30 PM Medical Record Number: [id number] Patient Account Number: [id number] Date of Birth/Sex: Treating RN: 17-Nov-1965 (56 y.o. Lytle Michaels Primary Care Ahley Bulls: Clinic, [name] Other Clinician: Referring Mathews Stuhr: Treating Elain Wixon/Extender: [name], [name] in Treatment: 3 Active Problems Location of Pain Severity and Description of Pain Patient Has Paino Yes Site Locations Pain Location: Pain in Ulcers Duration of the Pain. Constant / Intermittento Constant Rate the pain. Current Pain Level: 8 Pain Management and Medication Current Pain Management: Electronic Signature(s) Signed: 11/09/2021 4:36:14 PM By: Antonieta Iba Signed: 12/12/2021 8:46:54 AM By: Thayer Dallas Entered By: Thayer Dallas on 11/09/2021 12:51:44 -------------------------------------------------------------------------------- Patient/Caregiver Education Details Patient Name:  Date of Service: [name] [facility] 5/11/2023andnbsp12:30 PM Medical  Record Number: [id number] Patient Account Number: [id number] Date of Birth/Gender: Treating RN: 04-30-1966 (56 y.o. Lytle Michaels Primary Care Physician: Clinic, [name] Other Clinician: Referring Physician: Treating Physician/Extender: [name] Clinic, [name] in Treatment: 3 Education Assessment Education Provided To: Patient Education Topics Provided Wound/Skin Impairment: Methods: Explain/Verbal Responses: Reinforcements needed, State content correctly Electronic Signature(s) Signed: 12/12/2021 8:46:54 AM By: Thayer Dallas Entered By: Thayer Dallas on 11/09/2021 13:21:08 -------------------------------------------------------------------------------- Wound Assessment Details Patient Name: Date of Service: [name], [name] 11/09/2021 12:30 PM Medical Record Number: [id number] Patient Account Number: [id number] Date of Birth/Sex: Treating RN: June 03, 1966 (56 y.o. Lytle Michaels Primary Care Averi Cacioppo: Clinic, [name] Other Clinician: Referring Curtina Grills: Treating Jerzey Komperda/Extender: [name], [name] in Treatment: 3 Wound Status Wound Number: 1 Primary Etiology: Diabetic Wound/Ulcer of the Lower Extremity Wound Location: Right, Medial T Great oe Wound Status: Open Wounding Event: Gradually Appeared Comorbid Hypertension, Type II Diabetes, Osteomyelitis, Neuropathy History: Date Acquired: 10/10/2021 Weeks Of Treatment: 3 Clustered Wound: No Photos Wound Measurements Length: (cm) 1.1 Width: (cm) 1.7 Depth: (cm) 0.3 Area: (cm) 1.469 Volume: (cm) 0.441 % Reduction in Area: 68.8% % Reduction in Volume: 88.3% Epithelialization: None Tunneling: No Undermining: No Wound Description Classification: Grade 3 Exudate Amount: Medium Exudate Type: Serosanguineous Exudate Color: red, brown Foul Odor After Cleansing: No Slough/Fibrino Yes Wound Bed Granulation Amount: Medium (34-66%) Necrotic Amount: Medium  (34-66%) Electronic Signature(s) Signed: 11/09/2021 4:36:14 PM By: Antonieta Iba Signed: 12/12/2021 8:46:54 AM By: Thayer Dallas Entered By: Thayer Dallas on 11/09/2021 13:04:19 -------------------------------------------------------------------------------- Vitals Details Patient Name: Date of Service: [name], [name] 11/09/2021 12:30 PM Medical Record Number: [id number] Patient Account Number: [id number] Date of Birth/Sex: Treating RN: October 21, 1965 (56 y.o. Lytle Michaels Primary Care Daltyn Degroat: Clinic, [name] Other Clinician: Referring Dayan Desa: Treating Elsi Stelzer/Extender: [name], [name] in Treatment: 3 Vital Signs Time Taken: 12:48 Temperature (F): 98.1 Height (in): 69 Pulse (bpm): 96 Weight (lbs): 180 Respiratory Rate (breaths/min): 18 Body Mass Index (BMI): 26.6 Blood Pressure (mmHg): 146/87 Reference Range: 80 - 120 mg / dl Electronic Signature(s) Signed: 12/12/2021 8:46:54 AM By: Thayer Dallas Entered By: Thayer Dallas on 11/09/2021 12:51:22

## 2021-12-14 NOTE — Progress Notes (Signed)
JAREE, TRINKA ([id number]) Visit Report for 11/09/2021 Chief Complaint Document Details Patient Name: Date of Service: [name] 11/09/2021 12:30 PM Medical Record Number: [id number] Patient Account Number: [id number] Date of Birth/Sex: Treating RN: Jun 02, 1966 (56 y.o. Lytle Michaels Primary Care Provider: [name] Other Clinician: Referring Provider: Treating Provider/Extender: [name], [name] in Treatment: 3 Information Obtained from: Patient Chief Complaint 10/19/2021; Right great toe wound Electronic Signature(s) Signed: 11/09/2021 4:21:10 PM By: [name] DO Entered By: [name] on 11/09/2021 15:08:26 -------------------------------------------------------------------------------- Debridement Details Patient Name: Date of Service: [name], [name] 11/09/2021 12:30 PM Medical Record Number: [id number] Patient Account Number: [id number] Date of Birth/Sex: Treating RN: Jul 18, 1965 (56 y.o. Lytle Michaels Primary Care Provider: [name] Other Clinician: Referring Provider: Treating Provider/Extender: [name], [name] in Treatment: 3 Debridement Performed for Assessment: Wound #1 Right,Medial T Great oe Performed By: Physician [name], DO Debridement Type: Debridement Severity of Tissue Pre Debridement: Fat layer exposed Level of Consciousness (Pre-procedure): Awake and Alert Pre-procedure Verification/Time Out Yes - 13:17 Taken: Start Time: 13:17 Pain Control: Lidocaine T Area Debrided (L x W): otal 1.1 (cm) x 1.7 (cm) = 1.87 (cm) Tissue and other material debrided: Viable, Non-Viable, Slough, Subcutaneous, Slough Level: Skin/Subcutaneous Tissue Debridement Description: Excisional Instrument: Blade, Forceps Bleeding: Minimum Hemostasis Achieved: Pressure End Time: 13:17 Procedural Pain: 0 Post Procedural Pain: 0 Response to Treatment: Procedure was tolerated  well Level of Consciousness (Post- Awake and Alert procedure): Post Debridement Measurements of Total Wound Length: (cm) 1.1 Width: (cm) 1.7 Depth: (cm) 0.3 Volume: (cm) 0.441 Character of Wound/Ulcer Post Debridement: Improved Severity of Tissue Post Debridement: Fat layer exposed Post Procedure Diagnosis Same as Pre-procedure Electronic Signature(s) Signed: 11/09/2021 4:21:10 PM By: [name] DO Signed: 11/09/2021 4:36:14 PM By: Antonieta Iba Signed: 12/12/2021 8:46:54 AM By: Thayer Dallas Entered By: Thayer Dallas on 11/09/2021 13:19:42 -------------------------------------------------------------------------------- HPI Details Patient Name: Date of Service: [name], [name] 11/09/2021 12:30 PM Medical Record Number: [id number] Patient Account Number: [id number] Date of Birth/Sex: Treating RN: 02-Apr-1966 (56 y.o. 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 He had an MRI of the right foot on 10/16/2021 that showed signal changes in the first proximal and distal phalanx consistent with acute osteomyelitis. He states that he developed a wound spontaneously to this area 10 days ago. He does have significant peripheral neuropathy and does not have sensation to the wound area or bottom of his feet. He reports being on 3 different antibiotics including clindamycin and doxycycline in the past couple of weeks. He is currently on Augmentin based on wound cultures done in the ED. He has currently been keeping the area covered. He currently denies systemic signs of  infection. 4/25; patient presents for follow-up. HBO orders have been placed and we are awaiting [facility]. He obtained his chest x-ray with no cardiopulmonary abnormalities. He has been using Dakin's wet-to-dry dressings to the toe wound. He currently denies systemic signs of infection. He is currently taking Augmentin. He has an infectious disease appointment on 5/2. He has not heard back about his ABIs with TBI's. 5/2; patient presents for follow-up.  He has been using Dakin's wet-to-dry dressings without issues. He continues to take Augmentin daily. He saw Dr. Algis Liming, infectious disease today. At this time Augmentin is being continued. He has not heard to schedule his ABIs with TBI's. He currently denies signs of infection. He has been approved for HBO treatment and will start in 1 week. 5/11; patient presents for follow-up. He has been using Dakin's wet-to-dry dressings without issues. He reports doing well with HBO therapy. Today he cannot make it to his treatment however will be back Friday. He has been consistent in his care. He currently denies signs of infection. Electronic Signature(s) Signed: 11/09/2021 4:21:10 PM By: [name] DO Entered By: [name] on 11/09/2021 15:12:10 -------------------------------------------------------------------------------- Physical Exam Details Patient Name: Date of Service: [name], [name] 11/09/2021 12:30 PM Medical Record Number: [id number] Patient Account Number: [id number] Date of Birth/Sex: Treating RN: [birth year] (56 y.o. Lytle Michaels Primary Care Provider: [name] Other Clinician: Referring Provider: Treating Provider/Extender: [name], [name] in Treatment: 3 Constitutional respirations regular, non-labored and within target range for patient.. Cardiovascular 2+ dorsalis pedis/posterior tibialis pulses. Psychiatric pleasant and cooperative. Notes Right foot: T the right great  toe there is an open wound with mostly granulation tissue present. There are some nonviable tissue and undermining at the 9 o o'clock position. No surrounding signs of infection. Electronic Signature(s) Signed: 11/09/2021 4:21:10 PM By: [name] DO Entered By: [name] on 11/09/2021 15:12:42 -------------------------------------------------------------------------------- Physician Orders Details Patient Name: Date of Service: [name], [name] 11/09/2021 12:30 PM Medical Record Number: [id number] Patient Account Number: [id number] Date of Birth/Sex: Treating RN: 1965-07-13 (56 y.o. Lytle Michaels Primary Care Provider: [name] Other Clinician: Referring Provider: Treating Provider/Extender: [name], [name] in Treatment: 3 Verbal / Phone Orders: No Diagnosis Coding Follow-up Appointments ppointment in 1 week. - w/ Dr. Mikey Bussing and Lennox Laity RN Room # 7 @ 1230 Return A Bathing/ Shower/ Hygiene May shower with protection but do not get wound dressing(s) wet. Edema Control - Lymphedema / SCD / Other Elevate legs to the level of the heart or above for 30 minutes daily and/or when sitting, a frequency of: Avoid standing for long periods of time. Moisturize legs daily. Off-Loading Other: - Keep pressure off of right foot Hyperbaric Oxygen Therapy Indication: - Wagner Grade 3/ + osteomyelitis Right Great Toe If appropriate for treatment, begin HBOT per protocol: 2.0 ATA for 90 Minutes with 2 Five (5) Minute A Breaks ir Total Number of Treatments: - 40 One treatments per day (delivered Monday through Friday unless otherwise specified in Special Instructions below): Finger stick Blood Glucose Pre- and Post- HBOT Treatment. Follow Hyperbaric Oxygen Glycemia Protocol A frin (Oxymetazoline HCL) 0.05% nasal spray - 1 spray in both nostrils daily as needed prior to HBO treatment for difficulty clearing ears Wound Treatment Wound #1 - T  Great oe Wound Laterality: Right, Medial Cleanser: Soap and Water 1 x Per Day/30 Days Discharge Instructions: May shower and wash wound with dial antibacterial soap and water prior to dressing change. Cleanser: Wound Cleanser (Generic) 1 x Per Day/30 Days Discharge Instructions: Cleanse the wound with wound cleanser prior to applying a clean dressing using gauze sponges, not tissue or cotton balls. Prim Dressing: Dakin's Solution 0.25%, 16 (oz) (Generic) 1 x Per Day/30 Days ary Discharge Instructions: Moisten gauze with Dakin's solution Secondary Dressing: Bordered Gauze, 4x4 in (Generic) 1 x Per Day/30 Days Discharge Instructions: Apply over primary dressing as directed. Secured With:  Conforming Stretch Gauze Bandage, Sterile 2x75 (in/in) (Generic) 1 x Per Day/30 Days Discharge Instructions: Secure with stretch gauze as directed. Secured With: 31M Medipore H Soft Cloth Surgical T ape, 4 x 10 (in/yd) (Generic) 1 x Per Day/30 Days Discharge Instructions: Secure with tape as directed. GLYCEMIA INTERVENTIONS PROTOCOL PRE-HBO GLYCEMIA INTERVENTIONS ACTION INTERVENTION Obtain pre-HBO capillary blood glucose (ensure 1 physician order is in chart). A. Notify HBO physician and await physician orders. 2 If result is 70 mg/dl or below: B. If the result meets the hospital definition of a critical result, follow hospital policy. A. Give patient an 8 ounce Glucerna Shake, an 8 ounce Ensure, or 8 ounces of a Glucerna/Ensure equivalent dietary supplement*. B. Wait 30 minutes. If result is 71 mg/dl to 315 mg/dl: C. Retest patients capillary blood glucose (CBG). D. If result greater than or equal to 110 mg/dl, proceed with HBO. If result less than 110 mg/dl, notify HBO physician and consider holding HBO. If result is 131 mg/dl to 400 mg/dl: A. Proceed with HBO. A. Notify HBO physician and await physician orders. B. It is recommended to hold HBO and do If result is 250 mg/dl or  greater: blood/urine ketone testing. C. If the result meets the hospital definition of a critical result, follow hospital policy. POST-HBO GLYCEMIA INTERVENTIONS ACTION INTERVENTION Obtain post HBO capillary blood glucose (ensure 1 physician order is in chart). A. Notify HBO physician and await physician orders. 2 If result is 70 mg/dl or below: B. If the result meets the hospital definition of a critical result, follow hospital policy. A. Give patient an 8 ounce Glucerna Shake, an 8 ounce Ensure, or 8 ounces of a Glucerna/Ensure equivalent dietary supplement*. B. Wait 15 minutes for symptoms of If result is 71 mg/dl to 867 mg/dl: hypoglycemia (i.e. nervousness, anxiety, sweating, chills, clamminess, irritability, confusion, tachycardia or dizziness). C. If patient asymptomatic, discharge patient. If patient symptomatic, repeat capillary blood glucose (CBG) and notify HBO physician. If result is 101 mg/dl to 619 mg/dl: A. Discharge patient. A. Notify HBO physician and await physician orders. B. It is recommended to do blood/urine ketone If result is 250 mg/dl or greater: testing. C. If the result meets the hospital definition of a critical result, follow hospital policy. *Juice or candies are NOT equivalent products. If patient refuses the Glucerna or Ensure, please consult the hospital dietitian for an appropriate substitute. Electronic Signature(s) Signed: 11/09/2021 4:21:10 PM By: [name] DO Entered By: [name] on 11/09/2021 15:12:52 -------------------------------------------------------------------------------- Problem List Details Patient Name: Date of Service: [name], [name] 11/09/2021 12:30 PM Medical Record Number: [id number] Patient Account Number: [id number] Date of Birth/Sex: Treating RN: 07-10-1965 (56 y.o. Lytle Michaels Primary Care Provider: [name] Other Clinician: Referring Provider: Treating Provider/Extender:  [name], [name] in Treatment: 3 Active Problems ICD-10 Encounter Code Description Active Date MDM Diagnosis E11.621 Type 2 diabetes mellitus with foot ulcer 10/19/2021 No Yes L97.514 Non-pressure chronic ulcer of other part of right foot with necrosis of bone 10/19/2021 No Yes M86.071 Acute hematogenous osteomyelitis, right ankle and foot 10/19/2021 No Yes Inactive Problems Resolved Problems Electronic Signature(s) Signed: 11/09/2021 4:21:10 PM By: [name] DO Entered By: [name] on 11/09/2021 15:08:08 -------------------------------------------------------------------------------- Progress Note Details Patient Name: Date of Service: [name], [name] 11/09/2021 12:30 PM Medical Record Number: [id number] Patient Account Number: [id number] Date of Birth/Sex: Treating RN: 12/30/1965 (56 y.o. 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 He had an MRI of the right foot on 10/16/2021 that showed signal changes in the first proximal and distal phalanx consistent with acute osteomyelitis. He states that he developed a wound spontaneously to this area 10 days ago. He does have significant peripheral neuropathy and does not have sensation to the wound area or bottom of his feet. He reports being on 3 different antibiotics including clindamycin and doxycycline in the past couple of weeks. He is currently on Augmentin based on wound cultures done in the  ED. He has currently been keeping the area covered. He currently denies systemic signs of infection. 4/25; patient presents for follow-up. HBO orders have been placed and we are awaiting [facility] approval. He obtained his chest x-ray with no cardiopulmonary abnormalities. He has been using Dakin's wet-to-dry dressings to the toe wound. He currently denies systemic signs of infection. He is currently taking Augmentin. He has an infectious disease appointment on 5/2. He has not heard back about his ABIs with TBI's. 5/2; patient presents for follow-up. He has been using Dakin's wet-to-dry dressings without issues. He continues to take Augmentin daily. He saw Dr. Algis LimingVandam, infectious disease today. At this time Augmentin is being continued. He has not heard to schedule his ABIs with TBI's. He currently denies signs of infection. He has been approved for HBO treatment and will start in 1 week. 5/11; patient presents for follow-up. He has been using Dakin's wet-to-dry dressings without issues. He reports doing well with HBO therapy. Today he cannot make it to his treatment however will be back Friday. He has been consistent in his care. He currently denies signs of infection. Patient History Information obtained from Patient, Caregiver, Chart. Family History Unknown History. Social History Current some day smoker - cigar, Alcohol Use - Never, Drug Use - No History, Caffeine Use - Moderate. Medical History Respiratory Denies history of Aspiration, Asthma, Chronic Obstructive Pulmonary Disease (COPD) Cardiovascular Patient has history of Hypertension Denies history of Angina, Arrhythmia, Congestive Heart Failure, Coronary Artery Disease, Deep Vein Thrombosis, Hypotension, Myocardial Infarction, Peripheral Arterial Disease, Peripheral Venous Disease, Phlebitis, Vasculitis Endocrine Patient has history of Type II Diabetes Musculoskeletal Patient has history of Osteomyelitis - Right Great  Toe!!! Neurologic Patient has history of Neuropathy Hospitalization/Surgery History - I and D back. Medical A Surgical History Notes nd Psychiatric PTSD Objective Constitutional respirations regular, non-labored and within target range for patient.. Vitals Time Taken: 12:48 PM, Height: 69 in, Weight: 180 lbs, BMI: 26.6, Temperature: 98.1 F, Pulse: 96 bpm, Respiratory Rate: 18 breaths/min, Blood Pressure: 146/87 mmHg. Cardiovascular 2+ dorsalis pedis/posterior tibialis pulses. Psychiatric pleasant and cooperative. General Notes: Right foot: T the right great toe there is an open wound with mostly granulation tissue present. There are some nonviable tissue and o undermining at the 9 o'clock position. No surrounding signs of infection. Integumentary (Hair, Skin) Wound #1 status is Open. Original cause of wound was Gradually Appeared. The date acquired was: 10/10/2021. The wound has been in treatment 3 weeks. The wound is located on the Right,Medial T Great. The wound measures 1.1cm length x 1.7cm width x 0.3cm depth; 1.469cm^2 area and 0.441cm^3 volume. oe There  is no tunneling or undermining noted. There is a medium amount of serosanguineous drainage noted. There is medium (34-66%) granulation within the wound bed. There is a medium (34-66%) amount of necrotic tissue within the wound bed. Assessment Active Problems ICD-10 Type 2 diabetes mellitus with foot ulcer Non-pressure chronic ulcer of other part of right foot with necrosis of bone Acute hematogenous osteomyelitis, right ankle and foot Patient has done well with Dakin's wet-to-dry dressings. I debrided nonviable tissue. No signs of infection on exam. His glucose levels are outside of range with mostly in the 300s and 400s when he comes in to HBO therapy. We sent a referral to endocrinology several weeks back and we gave him the number to call to schedule an appointment. We discussed the importance of glucose control in wound  healing. He knows he is at high risk for amputation.Follow-up in 1 week. Procedures Wound #1 Pre-procedure diagnosis of Wound #1 is a Diabetic Wound/Ulcer of the Lower Extremity located on the Right,Medial T Great .Severity of Tissue Pre oe Debridement is: Fat layer exposed. There was a Excisional Skin/Subcutaneous Tissue Debridement with a total area of 1.87 sq cm performed by [name], DO. With the following instrument(s): Blade, and Forceps to remove Viable and Non-Viable tissue/material. Material removed includes Subcutaneous Tissue and Slough and after achieving pain control using Lidocaine. No specimens were taken. A time out was conducted at 13:17, prior to the start of the procedure. A Minimum amount of bleeding was controlled with Pressure. The procedure was tolerated well with a pain level of 0 throughout and a pain level of 0 following the procedure. Post Debridement Measurements: 1.1cm length x 1.7cm width x 0.3cm depth; 0.441cm^3 volume. Character of Wound/Ulcer Post Debridement is improved. Severity of Tissue Post Debridement is: Fat layer exposed. Post procedure Diagnosis Wound #1: Same as Pre-Procedure Plan Follow-up Appointments: Return Appointment in 1 week. - w/ Dr. Mikey Bussing and Lennox Laity RN Room # 7 @ 1230 Bathing/ Shower/ Hygiene: May shower with protection but do not get wound dressing(s) wet. Edema Control - Lymphedema / SCD / Other: Elevate legs to the level of the heart or above for 30 minutes daily and/or when sitting, a frequency of: Avoid standing for long periods of time. Moisturize legs daily. Off-Loading: Other: - Keep pressure off of right foot Hyperbaric Oxygen Therapy: Indication: - Wagner Grade 3/ + osteomyelitis Right Great T oe If appropriate for treatment, begin HBOT per protocol: 2.0 ATA for 90 Minutes with 2 Five (5) Minute Air Breaks T Number of Treatments: - 40 otal One treatments per day (delivered Monday through Friday unless otherwise  specified in Special Instructions below): Finger stick Blood Glucose Pre- and Post- HBOT Treatment. Follow Hyperbaric Oxygen Glycemia Protocol Afrin (Oxymetazoline HCL) 0.05% nasal spray - 1 spray in both nostrils daily as needed prior to HBO treatment for difficulty clearing ears WOUND #1: - T Great Wound Laterality: Right, Medial oe Cleanser: Soap and Water 1 x Per Day/30 Days Discharge Instructions: May shower and wash wound with dial antibacterial soap and water prior to dressing change. Cleanser: Wound Cleanser (Generic) 1 x Per Day/30 Days Discharge Instructions: Cleanse the wound with wound cleanser prior to applying a clean dressing using gauze sponges, not tissue or cotton balls. Prim Dressing: Dakin's Solution 0.25%, 16 (oz) (Generic) 1 x Per Day/30 Days ary Discharge Instructions: Moisten gauze with Dakin's solution Secondary Dressing: Bordered Gauze, 4x4 in (Generic) 1 x Per Day/30 Days Discharge Instructions: Apply over primary dressing as directed. Secured With:  Conforming Stretch Gauze Bandage, Sterile 2x75 (in/in) (Generic) 1 x Per Day/30 Days Discharge Instructions: Secure with stretch gauze as directed. Secured With: 2M Medipore H Soft Cloth Surgical T ape, 4 x 10 (in/yd) (Generic) 1 x Per Day/30 Days Discharge Instructions: Secure with tape as directed. 1. In office sharp debridement 2. Dakin's wet-to-dry dressings 3. Continue HBO therapy 4. Follow-up in 1 week Electronic Signature(s) Signed: 11/09/2021 4:21:10 PM By: [name] DO Entered By: [name] on 11/09/2021 15:16:24 -------------------------------------------------------------------------------- HxROS Details Patient Name: Date of Service: [name], [name] 11/09/2021 12:30 PM Medical Record Number: [id number] Patient Account Number: [id number] Date of Birth/Sex: Treating RN: 1966/06/08 (56 y.o. Lytle Michaels Primary Care Provider: [name] Other Clinician: Referring  Provider: Treating Provider/Extender: [name] [name] in Treatment: 3 Information Obtained From Patient Caregiver Chart Respiratory Medical History: Negative for: Aspiration; Asthma; Chronic Obstructive Pulmonary Disease (COPD) Cardiovascular Medical History: Positive for: Hypertension Negative for: Angina; Arrhythmia; Congestive Heart Failure; Coronary Artery Disease; Deep Vein Thrombosis; Hypotension; Myocardial Infarction; Peripheral Arterial Disease; Peripheral Venous Disease; Phlebitis; Vasculitis Endocrine Medical History: Positive for: Type II Diabetes Treated with: Insulin Blood sugar tested every day: No Musculoskeletal Medical History: Positive for: Osteomyelitis - Right Great Toe!!! Neurologic Medical History: Positive for: Neuropathy Psychiatric Medical History: Past Medical History Notes: PTSD Immunizations Pneumococcal Vaccine: Received Pneumococcal Vaccination: No Implantable Devices None Hospitalization / Surgery History Type of Hospitalization/Surgery I and D back Family and Social History Unknown History: Yes; Current some day smoker - cigar; Alcohol Use: Never; Drug Use: No History; Caffeine Use: Moderate; Financial Concerns: No; Food, Clothing or Shelter Needs: No; Support System Lacking: No; Transportation Concerns: No Electronic Signature(s) Signed: 11/09/2021 4:21:10 PM By: [name] DO Signed: 11/09/2021 4:36:14 PM By: Antonieta Iba Entered By: [name] on 11/09/2021 15:12:15 -------------------------------------------------------------------------------- SuperBill Details Patient Name: Date of Service: [name], [name] 11/09/2021 Medical Record Number: [id number] Patient Account Number: [id number] Date of Birth/Sex: Treating RN: 1965-11-06 (56 y.o. Lytle Michaels Primary Care Provider: [name] Other Clinician: Referring Provider: Treating Provider/Extender: [name] [name] in Treatment: 3 Diagnosis Coding ICD-10 Codes Code Description E11.621 Type 2 diabetes mellitus with foot ulcer L97.514 Non-pressure chronic ulcer of other part of right foot with necrosis of bone M86.071 Acute hematogenous osteomyelitis, right ankle and foot Facility Procedures Physician Procedures : CPT4 Code Description Modifier [id number] - WC PHYS SUBQ TISS 20 SQ CM ICD-10 Diagnosis Description L97.514 Non-pressure chronic ulcer of other part of right foot with necrosis of bone Quantity: 1 Electronic Signature(s) Signed: 11/09/2021 4:21:10 PM By: [name] DO Entered By: [name] on 11/09/2021 15:16:31

## 2021-12-15 ENCOUNTER — Encounter (INDEPENDENT_AMBULATORY_CARE_PROVIDER_SITE_OTHER): Payer: Self-pay | Admitting: Ophthalmology

## 2021-12-15 ENCOUNTER — Encounter (HOSPITAL_BASED_OUTPATIENT_CLINIC_OR_DEPARTMENT_OTHER): Payer: No Typology Code available for payment source | Admitting: Internal Medicine

## 2021-12-15 ENCOUNTER — Ambulatory Visit (INDEPENDENT_AMBULATORY_CARE_PROVIDER_SITE_OTHER): Payer: No Typology Code available for payment source | Admitting: Ophthalmology

## 2021-12-15 DIAGNOSIS — H3582 Retinal ischemia: Secondary | ICD-10-CM | POA: Diagnosis not present

## 2021-12-15 DIAGNOSIS — I10 Essential (primary) hypertension: Secondary | ICD-10-CM | POA: Diagnosis not present

## 2021-12-15 DIAGNOSIS — E113413 Type 2 diabetes mellitus with severe nonproliferative diabetic retinopathy with macular edema, bilateral: Secondary | ICD-10-CM | POA: Diagnosis not present

## 2021-12-15 DIAGNOSIS — H25813 Combined forms of age-related cataract, bilateral: Secondary | ICD-10-CM

## 2021-12-15 DIAGNOSIS — H35033 Hypertensive retinopathy, bilateral: Secondary | ICD-10-CM

## 2021-12-15 MED ORDER — BEVACIZUMAB CHEMO INJECTION 1.25MG/0.05ML SYRINGE FOR KALEIDOSCOPE
1.2500 mg | INTRAVITREAL | Status: AC | PRN
  Administered 2021-12-15: 1.25 mg via INTRAVITREAL

## 2021-12-19 ENCOUNTER — Encounter (HOSPITAL_BASED_OUTPATIENT_CLINIC_OR_DEPARTMENT_OTHER): Payer: No Typology Code available for payment source | Admitting: Internal Medicine

## 2021-12-19 DIAGNOSIS — E11621 Type 2 diabetes mellitus with foot ulcer: Secondary | ICD-10-CM

## 2021-12-19 DIAGNOSIS — M86071 Acute hematogenous osteomyelitis, right ankle and foot: Secondary | ICD-10-CM | POA: Diagnosis not present

## 2021-12-19 DIAGNOSIS — L97514 Non-pressure chronic ulcer of other part of right foot with necrosis of bone: Secondary | ICD-10-CM | POA: Diagnosis not present

## 2021-12-19 NOTE — Progress Notes (Signed)
x Per Day/30 Days Discharge Instructions: Cleanse the wound with wound cleanser prior to applying a clean dressing using gauze sponges, not tissue or cotton balls. Topical: Dakins 0.25% 1 x Per Day/30 Days Discharge Instructions: Moisten gauze and pack into wound Secondary Dressing: Woven Gauze Sponges 2x2 in 1 x Per Day/30 Days Discharge Instructions: Apply over primary dressing as directed. Secured With: Insurance underwriterConforming Stretch Gauze Bandage, Sterile 2x75 (in/in) (Generic) 1 x Per Day/30 Days Discharge Instructions: Secure with stretch gauze as directed. Secured With: 45M Medipore H Soft Cloth Surgical T ape, 4 x 10 (in/yd) (Generic) 1 x Per Day/30 Days Discharge Instructions: Secure with tape as directed. Consults Podiatry - Referral to Dr. Loreta AveWagner for right great toe osteomyelitis. Evaluate for amputation. 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) Patient Medications llergies: lisinopril A Notifications Medication Indication Start End 12/14/2021 Bactrim DS DOSE 1 - oral 800 mg-160 mg tablet - 1 tablet oral BID x 7 days 12/14/2021 Dakin's Solution DOSE 1 - miscellaneous 0.125 % solution - moisten gauze for wet to dry dressings Electronic Signature(s) Signed: 12/14/2021 10:51:54 AM By: Geralyn CorwinHoffman, Kalandra Masters [name] Previous Signature: 12/14/2021 9:45:59 AM Version By: Geralyn CorwinHoffman, Madigan Rosensteel [name] Entered By: Geralyn CorwinHoffman, Greydon Betke on 12/14/2021 10:48:36 Prescription 12/14/2021 -------------------------------------------------------------------------------- Christian LorenzoWOFFORD, [name] Patient Name: Provider: 1965/09/10 [id number] Date of Birth: NPI#[place] Mast: [id number] Sex: DEA #: [phone] Phone #: License #: [facility] Patient [name][address] [place] [address] , [address] [name], [place] [address] [phone] Allergies lisinopril Provider's Orders Podiatry - ICD10: L97.514 - Referral to Dr. Loreta AveWagner for right great toe osteomyelitis. Evaluate for amputation. Hand Signature: Date(s): Electronic Signature(s) Signed: 12/14/2021 10:51:54 AM By: Geralyn CorwinHoffman, Emili Mcloughlin [name] Entered By: Geralyn CorwinHoffman, Malique Driskill on 12/14/2021 10:48:36 -------------------------------------------------------------------------------- Problem List Details Patient Name: Date of Service: [name], [name] 12/14/2021 8:45 A M Medical Record Number: [id number] Patient Account Number: [id number] Date of Birth/Sex: Treating RN: 1965/09/10 (56 y.o. Lytle MichaelsM) Barnhart, Jodi [facility] [facility] [place] Other Clinician: Referring Provider: Treating Provider/Extender: Geralyn CorwinHoffman, Ehab [facility] [place] We[place] in Treatment: 8 Active Problems ICD-10 Encounter Code Description Active Date MDM Diagnosis E11.621 Type 2 diabetes mellitus with foot ulcer 10/19/2021 No Yes L97.514 Non-pressure chronic ulcer of other part of right foot with necrosis of bone 10/19/2021 No Yes M86.071 Acute hematogenous osteomyelitis, right ankle and foot 10/19/2021 No Yes Inactive Problems Resolved Problems Electronic Signature(s) Signed: 12/14/2021 10:51:54 AM By: Geralyn CorwinHoffman, Mali Eppard [name] Entered By: Geralyn CorwinHoffman, Kevyn Boquet on 12/14/2021 10:45:43 -------------------------------------------------------------------------------- Progress Note Details Patient Name: Date of Service: [name], [name] 12/14/2021 8:45 A M Medical Record Number: [id number] Patient Account Number: [id number] Date of Birth/Sex: Treating RN: 1965/09/10 (56 y.o. 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 He had an MRI of the right foot on 10/16/2021 that showed signal changes in the first proximal and distal phalanx consistent with acute osteomyelitis. He states that he developed a wound spontaneously to this area 10 days ago. He does have significant peripheral  Instructions: May shower and wash wound with dial antibacterial soap and water prior to dressing change. Cleanser: Wound Cleanser (Generic) 1 x Per Day/30 Days Discharge Instructions: Cleanse the wound with wound cleanser prior to applying a clean dressing using gauze sponges, not tissue or cotton balls. Topical: Dakins 0.25% 1 x Per Day/30 Days Discharge Instructions: Moisten gauze and pack into wound Secondary Dressing: Woven Gauze Sponges 2x2 in 1 x Per Day/30 Days Discharge Instructions: Apply over primary dressing as directed. Secured With: Insurance underwriter, Sterile 2x75 (in/in) (Generic) 1 x Per Day/30 Days Discharge Instructions: Secure with stretch gauze as directed. Secured With: 61M Medipore H Soft Cloth Surgical T ape, 4 x 10 (in/yd) (Generic) 1 x Per Day/30 Days Discharge Instructions: Secure with tape as directed. 1. Refer to Dr. Ardelle Anton 2. Dakin's wet-to-dry dressings 3. Bactrim 4. Follow-up in 1 week Electronic Signature(s) Signed: 12/14/2021 10:51:54 AM By: [name] [name] Entered By: [name] on 12/14/2021 10:51:00 -------------------------------------------------------------------------------- HxROS Details Patient Name: Date of Service: Christian Regal [name], [name] 12/14/2021 8:45 A M Medical Record Number: [id number] Patient Account Number: [id number] Date of Birth/Sex: Treating RN: Dec 04, 1965 (56 y.o. Lytle Michaels Primary Care Provider: [name] Other  Clinician: Referring Provider: Treating Provider/Extender: [name] [facility] [name] in Treatment: 8 Information Obtained From Patient Caregiver Chart Respiratory Medical History: Negative for: Aspiration; Asthma; Chronic Obstructive Pulmonary Disease (COPD) Cardiovascular Medical History: Positive for: Hypertension Negative for: Angina; Arrhythmia; Congestive Heart Failure; Coronary Artery Disease; Deep Vein Thrombosis; Hypotension; Myocardial Infarction; Peripheral Arterial Disease; Peripheral Venous Disease; Phlebitis; Vasculitis Endocrine Medical History: Positive for: Type II Diabetes Treated with: Insulin Blood sugar tested every day: No Musculoskeletal Medical History: Positive for: Osteomyelitis - Right Great Toe!!! Neurologic Medical History: Positive for: Neuropathy Psychiatric Medical History: Past Medical History Notes: PTSD Immunizations Pneumococcal Vaccine: Received Pneumococcal Vaccination: No Implantable Devices None Hospitalization / Surgery History Type of Hospitalization/Surgery I and D back Family and Social History Unknown History: Yes; Current some day smoker - cigar; Alcohol Use: Never; Drug Use: No History; Caffeine Use: Moderate; Financial Concerns: No; Food, Clothing or Shelter Needs: No; Support System Lacking: No; Transportation Concerns: No Electronic Signature(s) Signed: 12/14/2021 10:51:54 AM By: [name] [name] Signed: 12/14/2021 5:26:14 PM By: Antonieta Iba Entered By: [name] on 12/14/2021 10:47:54 -------------------------------------------------------------------------------- SuperBill Details Patient Name: Date of Service: Christian Regal [name], [name] 12/14/2021 Medical Record Number: [id number] Patient Account Number: [id number] Date of Birth/Sex: Treating RN: December 10, 1965 (56 y.o. Lytle Michaels Primary Care Provider: [name] Other Clinician: Referring Provider: Treating Provider/Extender:  [name] [facility] [name] in Treatment: 8 Diagnosis Coding ICD-10 Codes Code Description E11.621 Type 2 diabetes mellitus with foot ulcer L97.514 Non-pressure chronic ulcer of other part of right foot with necrosis of bone M86.071 Acute hematogenous osteomyelitis, right ankle and foot Facility Procedures CPT4 Code: [id number] Description: 99213 - WOUND CARE VISIT-LEV 3 EST PT Modifier: Quantity: 1 Physician Procedures : CPT4 Code Description Modifier [id number] - WC PHYS LEVEL 4 - EST PT ICD-10 Diagnosis Description E11.621 Type 2 diabetes mellitus with foot ulcer L97.514 Non-pressure chronic ulcer of other part of right foot with necrosis of bone M86.071 Acute  hematogenous osteomyelitis, right ankle and foot Quantity: 1 Electronic Signature(s) Signed: 12/14/2021 10:51:54 AM By: [name] [name] Entered By: [name] on 12/14/2021 10:51:16  x Per Day/30 Days Discharge Instructions: Cleanse the wound with wound cleanser prior to applying a clean dressing using gauze sponges, not tissue or cotton balls. Topical: Dakins 0.25% 1 x Per Day/30 Days Discharge Instructions: Moisten gauze and pack into wound Secondary Dressing: Woven Gauze Sponges 2x2 in 1 x Per Day/30 Days Discharge Instructions: Apply over primary dressing as directed. Secured With: Insurance underwriterConforming Stretch Gauze Bandage, Sterile 2x75 (in/in) (Generic) 1 x Per Day/30 Days Discharge Instructions: Secure with stretch gauze as directed. Secured With: 45M Medipore H Soft Cloth Surgical T ape, 4 x 10 (in/yd) (Generic) 1 x Per Day/30 Days Discharge Instructions: Secure with tape as directed. Consults Podiatry - Referral to Dr. Loreta AveWagner for right great toe osteomyelitis. Evaluate for amputation. 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) Patient Medications llergies: lisinopril A Notifications Medication Indication Start End 12/14/2021 Bactrim DS DOSE 1 - oral 800 mg-160 mg tablet - 1 tablet oral BID x 7 days 12/14/2021 Dakin's Solution DOSE 1 - miscellaneous 0.125 % solution - moisten gauze for wet to dry dressings Electronic Signature(s) Signed: 12/14/2021 10:51:54 AM By: Geralyn CorwinHoffman, Kalandra Masters [name] Previous Signature: 12/14/2021 9:45:59 AM Version By: Geralyn CorwinHoffman, Madigan Rosensteel [name] Entered By: Geralyn CorwinHoffman, Greydon Betke on 12/14/2021 10:48:36 Prescription 12/14/2021 -------------------------------------------------------------------------------- Christian LorenzoWOFFORD, [name] Patient Name: Provider: 1965/09/10 [id number] Date of Birth: NPI#[place] Mast: M [id number] Sex: DEA #: [phone] Phone #: License #: [facility] Patient [name]: [address] [place] [address] , [address] [name], [place] [address] [phone] Allergies lisinopril Provider's Orders Podiatry - ICD10: L97.514 - Referral to Dr. Loreta AveWagner for right great toe osteomyelitis. Evaluate for amputation. Hand Signature: Date(s): Electronic Signature(s) Signed: 12/14/2021 10:51:54 AM By: Geralyn CorwinHoffman, Emili Mcloughlin [name] Entered By: Geralyn CorwinHoffman, Malique Driskill on 12/14/2021 10:48:36 -------------------------------------------------------------------------------- Problem List Details Patient Name: Date of Service: [name], [name] 12/14/2021 8:45 A M Medical Record Number: [id number] Patient Account Number: [id number] Date of Birth/Sex: Treating RN: 1965/09/10 (56 y.o. Lytle MichaelsM) Barnhart, Jodi [facility] Other Clinician: Referring Provider: Treating Provider/Extender: Geralyn CorwinHoffman, E[facility] [place] Weeks in Treatment: 8 Active Problems ICD-10 Encounter Code Description Active Date MDM Diagnosis E11.621 Type 2 diabetes mellitus with foot ulcer 10/19/2021 No Yes L97.514 Non-pressure chronic ulcer of other part of right foot with necrosis of bone 10/19/2021 No Yes M86.071 Acute hematogenous osteomyelitis, right ankle and foot 10/19/2021 No Yes Inactive Problems Resolved Problems Electronic Signature(s) Signed: 12/14/2021 10:51:54 AM By: Geralyn CorwinHoffman, Mali Eppard [name] Entered By: Geralyn CorwinHoffman, Kevyn Boquet on 12/14/2021 10:45:43 -------------------------------------------------------------------------------- Progress Note Details Patient Name: Date of Service: [name], [name] 12/14/2021 8:45 A M Medical Record Number: [id number] Patient Account Number: [id number] Date of Birth/Sex: Treating RN: 1965/09/10 (56 y.o. 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 He had an MRI of the right foot on 10/16/2021 that showed signal changes in the first proximal and distal phalanx consistent with acute osteomyelitis. He states that he developed a wound spontaneously to this area 10 days ago. He does have significant peripheral  x Per Day/30 Days Discharge Instructions: Cleanse the wound with wound cleanser prior to applying a clean dressing using gauze sponges, not tissue or cotton balls. Topical: Dakins 0.25% 1 x Per Day/30 Days Discharge Instructions: Moisten gauze and pack into wound Secondary Dressing: Woven Gauze Sponges 2x2 in 1 x Per Day/30 Days Discharge Instructions: Apply over primary dressing as directed. Secured With: Insurance underwriterConforming Stretch Gauze Bandage, Sterile 2x75 (in/in) (Generic) 1 x Per Day/30 Days Discharge Instructions: Secure with stretch gauze as directed. Secured With: 45M Medipore H Soft Cloth Surgical T ape, 4 x 10 (in/yd) (Generic) 1 x Per Day/30 Days Discharge Instructions: Secure with tape as directed. Consults Podiatry - Referral to Dr. Loreta AveWagner for right great toe osteomyelitis. Evaluate for amputation. 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) Patient Medications llergies: lisinopril A Notifications Medication Indication Start End 12/14/2021 Bactrim DS DOSE 1 - oral 800 mg-160 mg tablet - 1 tablet oral BID x 7 days 12/14/2021 Dakin's Solution DOSE 1 - miscellaneous 0.125 % solution - moisten gauze for wet to dry dressings Electronic Signature(s) Signed: 12/14/2021 10:51:54 AM By: Geralyn CorwinHoffman, Kalandra Masters [name] Previous Signature: 12/14/2021 9:45:59 AM Version By: Geralyn CorwinHoffman, Madigan Rosensteel [name] Entered By: Geralyn CorwinHoffman, Greydon Betke on 12/14/2021 10:48:36 Prescription 12/14/2021 -------------------------------------------------------------------------------- Christian LorenzoWOFFORD, [name] Patient Name: Provider: 1965/09/10 [id number] Date of Birth: NPI#[place] Mast: M [id number] Sex: DEA #: [phone] [id number] Phone #: License #: [facility] Patient [name]: [address] [place] [address] , [address] [name], [place] [address] [phone] Allergies lisinopril Provider's Orders Podiatry - ICD10: L97.514 - Referral to Dr. Loreta AveWagner for right great toe osteomyelitis. Evaluate for amputation. Hand Signature: Date(s): Electronic Signature(s) Signed: 12/14/2021 10:51:54 AM By: Geralyn CorwinHoffman, Emili Mcloughlin [name] Entered By: Geralyn CorwinHoffman, Malique Driskill on 12/14/2021 10:48:36 -------------------------------------------------------------------------------- Problem List Details Patient Name: Date of Service: [name], [name] 12/14/2021 8:45 A M Medical Record Number: [id number] Patient Account Number: [id number] Date of Birth/Sex: Treating RN: 1965/09/10 (56 y.o. Lytle MichaelsM) Barnhart, Jodi [facility] Other Clinician: Referring Provider: Treating Provider/Extender: Geralyn CorwinHoffman, [facility] [place] Weeks in Treatment: 8 Active Problems ICD-10 Encounter Code Description Active Date MDM Diagnosis E11.621 Type 2 diabetes mellitus with foot ulcer 10/19/2021 No Yes L97.514 Non-pressure chronic ulcer of other part of right foot with necrosis of bone 10/19/2021 No Yes M86.071 Acute hematogenous osteomyelitis, right ankle and foot 10/19/2021 No Yes Inactive Problems Resolved Problems Electronic Signature(s) Signed: 12/14/2021 10:51:54 AM By: Geralyn CorwinHoffman, Mali Eppard [name] Entered By: Geralyn CorwinHoffman, Kevyn Boquet on 12/14/2021 10:45:43 -------------------------------------------------------------------------------- Progress Note Details Patient Name: Date of Service: [name], [name] 12/14/2021 8:45 A M Medical Record Number: [id number] Patient Account Number: [id number] Date of Birth/Sex: Treating RN: 1965/09/10 (56 y.o. 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 He had an MRI of the right foot on 10/16/2021 that showed signal changes in the first proximal and distal phalanx consistent with acute osteomyelitis. He states that he developed a wound spontaneously to this area 10 days ago. He does have significant peripheral  infection. He continues antibiotics per ID. He has not scheduled his ABIs with TBI's yet. 6/1; patient presents for follow-up. He has been doing gentamicin ointment to the wound bed daily. He reports improvement in wound healing. He continues HBO therapy without issues. He is working on getting his blood glucose levels under control. 6/8; patient is seen today in conjunction with HBO. He is tolerating this well. 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 Involving the right great toe. His wound is on the medial aspect of the right great toe and the first second webspace. He has been using topical gentamicin and gauze. He comes in today with the wound looking benign however significant undermining. This does not probe to bone 6/15; patient presents for follow-up. He was seen by Dr. Leanord Hawking at last clinic visit. He debrided the wound and removed callus and reports there was drainage underneath. He cultured this and it grew E. coli resistant to ampicillin and sensitive to Bactrim. Patient is on Augmentin for osteomyelitis of the right great toe. Bone was not exposed before and today bone is protruding out of the wound bed. He currently denies systemic signs of infection. He has been using gentamicin and silver alginate to the wound bed. Electronic Signature(s) Signed: 12/14/2021 10:51:54 AM By: [name] [name] Entered By: [name] on 12/14/2021 10:47:45 -------------------------------------------------------------------------------- Physical Exam Details Patient Name: Date of Service: Christian Regal [name], [name] 12/14/2021 8:45 A M Medical Record Number: [id number] Patient Account Number: [id number] Date of Birth/Sex: Treating RN: 1965-10-10 (56 y.o. Lytle Michaels Primary Care Provider: [name] Other Clinician: Referring Provider: Treating Provider/Extender: [name] [facility] [name] in Treatment:  8 Constitutional respirations regular, non-labored and within target range for patient.. Cardiovascular 2+ dorsalis pedis/posterior tibialis pulses. Psychiatric pleasant and cooperative. Notes Right foot: T the medial aspect of the right great toe there is an open wound with granulation tissue and exposed bone. No purulent drainage noted. No o increased warmth or erythema. Electronic Signature(s) Signed: 12/14/2021 10:51:54 AM By: [name] [name] Entered By: [name] on 12/14/2021 10:48:27 -------------------------------------------------------------------------------- Physician Orders Details Patient Name: Date of Service: Christian Regal [name], [name] 12/14/2021 8:45 A M Medical Record Number: [id number] Patient Account Number: [id number] Date of Birth/Sex: Treating RN: 22-Apr-1966 (56 y.o. Lytle Michaels Primary Care Provider: [name] Other Clinician: Referring Provider: Treating Provider/Extender: [name] [facility] [name] in Treatment: 8 Verbal / Phone Orders: No Diagnosis Coding ICD-10 Coding Code Description E11.621 Type 2 diabetes mellitus with foot ulcer L97.514 Non-pressure chronic ulcer of other part of right foot with necrosis of bone M86.071 Acute hematogenous osteomyelitis, right ankle and foot Follow-up Appointments ppointment in 1 week. - w/ Dr. Mikey Bussing and Lennox Laity, RN (Room # 7) 6/20 @ 9:30 am. Return A Bathing/ Shower/ Hygiene May shower with protection but [name] not get wound dressing(s) wet. Edema Control - Lymphedema / SCD / Other Elevate legs to the level of the heart or above for 30 minutes daily and/or when sitting, a frequency of: Avoid standing for long periods of time. Moisturize legs daily. Off-Loading Other: - Keep pressure off of right foot Hyperbaric Oxygen Therapy Indication: - Wagner Grade 3/ + osteomyelitis Right Great Toe Discontinue HBO Therapy - Anticipated Amputation Wound Treatment Wound #1 - T Great oe  Wound Laterality: Right, Medial Cleanser: Soap and Water 1 x Per Day/30 Days Discharge Instructions: May shower and wash wound with dial antibacterial soap and water prior to dressing change. Cleanser: Wound Cleanser (Generic) 1  Instructions: May shower and wash wound with dial antibacterial soap and water prior to dressing change. Cleanser: Wound Cleanser (Generic) 1 x Per Day/30 Days Discharge Instructions: Cleanse the wound with wound cleanser prior to applying a clean dressing using gauze sponges, not tissue or cotton balls. Topical: Dakins 0.25% 1 x Per Day/30 Days Discharge Instructions: Moisten gauze and pack into wound Secondary Dressing: Woven Gauze Sponges 2x2 in 1 x Per Day/30 Days Discharge Instructions: Apply over primary dressing as directed. Secured With: Insurance underwriter, Sterile 2x75 (in/in) (Generic) 1 x Per Day/30 Days Discharge Instructions: Secure with stretch gauze as directed. Secured With: 61M Medipore H Soft Cloth Surgical T ape, 4 x 10 (in/yd) (Generic) 1 x Per Day/30 Days Discharge Instructions: Secure with tape as directed. 1. Refer to Dr. Ardelle Anton 2. Dakin's wet-to-dry dressings 3. Bactrim 4. Follow-up in 1 week Electronic Signature(s) Signed: 12/14/2021 10:51:54 AM By: [name] [name] Entered By: [name] on 12/14/2021 10:51:00 -------------------------------------------------------------------------------- HxROS Details Patient Name: Date of Service: Christian Regal [name], [name] 12/14/2021 8:45 A M Medical Record Number: [id number] Patient Account Number: [id number] Date of Birth/Sex: Treating RN: Dec 04, 1965 (56 y.o. Lytle Michaels Primary Care Provider: [name] Other  Clinician: Referring Provider: Treating Provider/Extender: [name] [facility], [name] in Treatment: 8 Information Obtained From Patient Caregiver Chart Respiratory Medical History: Negative for: Aspiration; Asthma; Chronic Obstructive Pulmonary Disease (COPD) Cardiovascular Medical History: Positive for: Hypertension Negative for: Angina; Arrhythmia; Congestive Heart Failure; Coronary Artery Disease; Deep Vein Thrombosis; Hypotension; Myocardial Infarction; Peripheral Arterial Disease; Peripheral Venous Disease; Phlebitis; Vasculitis Endocrine Medical History: Positive for: Type II Diabetes Treated with: Insulin Blood sugar tested every day: No Musculoskeletal Medical History: Positive for: Osteomyelitis - Right Great Toe!!! Neurologic Medical History: Positive for: Neuropathy Psychiatric Medical History: Past Medical History Notes: PTSD Immunizations Pneumococcal Vaccine: Received Pneumococcal Vaccination: No Implantable Devices None Hospitalization / Surgery History Type of Hospitalization/Surgery I and D back Family and Social History Unknown History: Yes; Current some day smoker - cigar; Alcohol Use: Never; Drug Use: No History; Caffeine Use: Moderate; Financial Concerns: No; Food, Clothing or Shelter Needs: No; Support System Lacking: No; Transportation Concerns: No Electronic Signature(s) Signed: 12/14/2021 10:51:54 AM By: [name] [name] Signed: 12/14/2021 5:26:14 PM By: Antonieta Iba Entered By: [name] on 12/14/2021 10:47:54 -------------------------------------------------------------------------------- SuperBill Details Patient Name: Date of Service: Christian Regal [name], [name] 12/14/2021 Medical Record Number: [id number] Patient Account Number: [id number] Date of Birth/Sex: Treating RN: December 10, 1965 (56 y.o. 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

## 2021-12-19 NOTE — Progress Notes (Signed)
SOUA, CALTAGIRONE ([id number]) Visit Report for 12/14/2021 Arrival Information Details Patient Name: Date of Service: [name] [facility] 12/14/2021 8:45 A M Medical Record Number: [id number] Patient Account Number: [id number] Date of Birth/Sex: Treating RN: [birth year] (56 y.o. [name] Primary Care Devaris Quirk: [facility], [name] Other Clinician: Referring Francois Elk: Treating Julius Matus/Extender: [facility], [name] in Treatment: 8 Visit Information History Since Last Visit Added or deleted any medications: No Patient Arrived: Ambulatory Any new allergies or adverse reactions: No Arrival Time: 08:55 Had a fall or experienced change in No Transfer Assistance: None activities of daily living that may affect Patient Requires Transmission-Based Precautions: No risk of falls: Patient Has Alerts: No Signs or symptoms of abuse/neglect since last visito No Hospitalized since last visit: No Implantable device outside of the clinic excluding No cellular tissue based products placed in the center since last visit: Has Dressing in Place as Prescribed: Yes Pain Present Now: No Electronic Signature(s) Signed: 12/14/2021 5:26:14 PM By: Lorrin Jackson Entered By: Lorrin Jackson on 12/14/2021 08:56:05 -------------------------------------------------------------------------------- Clinic Level of Care Assessment Details Patient Name: Date of Service: [name] [facility] 12/14/2021 8:45 A M Medical Record Number: [id number] Patient Account Number: [id number] Date of Birth/Sex: Treating RN: 1966/06/11 (56 y.o. [name] Primary Care Juwann Sherk: [facility], [name] Other Clinician: Referring Broden Holt: Treating Bao Bazen/Extender: [facility], [name] in Treatment: 8 Clinic Level of Care Assessment Items TOOL 4 Quantity Score X- 1 0 Use when only an EandM is performed on FOLLOW-UP visit ASSESSMENTS - Nursing Assessment / Reassessment X- 1  10 Reassessment of Co-morbidities (includes updates in patient status) X- 1 5 Reassessment of Adherence to Treatment Plan ASSESSMENTS - Wound and Skin A ssessment / Reassessment X - Simple Wound Assessment / Reassessment - one wound 1 5 '[]'  - 0 Complex Wound Assessment / Reassessment - multiple wounds '[]'  - 0 Dermatologic / Skin Assessment (not related to wound area) ASSESSMENTS - Focused Assessment '[]'  - 0 Circumferential Edema Measurements - multi extremities '[]'  - 0 Nutritional Assessment / Counseling / Intervention '[]'  - 0 Lower Extremity Assessment (monofilament, tuning fork, pulses) '[]'  - 0 Peripheral Arterial Disease Assessment (using hand held doppler) ASSESSMENTS - Ostomy and/or Continence Assessment and Care '[]'  - 0 Incontinence Assessment and Management '[]'  - 0 Ostomy Care Assessment and Management (repouching, etc.) PROCESS - Coordination of Care '[]'  - 0 Simple Patient / Family Education for ongoing care X- 1 20 Complex (extensive) Patient / Family Education for ongoing care X- 1 10 Staff obtains Programmer, systems, Records, T Results / Process Orders est '[]'  - 0 Staff telephones HHA, Nursing Homes / Clarify orders / etc '[]'  - 0 Routine Transfer to another Facility (non-emergent condition) '[]'  - 0 Routine Hospital Admission (non-emergent condition) '[]'  - 0 New Admissions / Biomedical engineer / Ordering NPWT Apligraf, etc. , '[]'  - 0 Emergency Hospital Admission (emergent condition) '[]'  - 0 Simple Discharge Coordination '[]'  - 0 Complex (extensive) Discharge Coordination PROCESS - Special Needs '[]'  - 0 Pediatric / Minor Patient Management '[]'  - 0 Isolation Patient Management '[]'  - 0 Hearing / Language / Visual special needs '[]'  - 0 Assessment of Community assistance (transportation, D/C planning, etc.) '[]'  - 0 Additional assistance / Altered mentation '[]'  - 0 Support Surface(s) Assessment (bed, cushion, seat, etc.) INTERVENTIONS - Wound Cleansing / Measurement X - Simple  Wound Cleansing - one wound 1 5 '[]'  - 0 Complex Wound Cleansing - multiple wounds X- 1 5 Wound Imaging (photographs - any number of wounds) '[]'  -  0 Wound Tracing (instead of photographs) X- 1 5 Simple Wound Measurement - one wound '[]'  - 0 Complex Wound Measurement - multiple wounds INTERVENTIONS - Wound Dressings X - Small Wound Dressing one or multiple wounds 1 10 '[]'  - 0 Medium Wound Dressing one or multiple wounds '[]'  - 0 Large Wound Dressing one or multiple wounds '[]'  - 0 Application of Medications - topical '[]'  - 0 Application of Medications - injection INTERVENTIONS - Miscellaneous '[]'  - 0 External ear exam '[]'  - 0 Specimen Collection (cultures, biopsies, blood, body fluids, etc.) '[]'  - 0 Specimen(s) / Culture(s) sent or taken to Lab for analysis '[]'  - 0 Patient Transfer (multiple staff / Civil Service fast streamer / Similar devices) '[]'  - 0 Simple Staple / Suture removal (25 or less) '[]'  - 0 Complex Staple / Suture removal (26 or more) '[]'  - 0 Hypo / Hyperglycemic Management (close monitor of Blood Glucose) '[]'  - 0 Ankle / Brachial Index (ABI) - do not check if billed separately X- 1 5 Vital Signs Has the patient been seen at the hospital within the last three years: Yes Total Score: 80 Level Of Care: New/Established - Level 3 Electronic Signature(s) Signed: 12/14/2021 5:26:14 PM By: Lorrin Jackson Entered By: Lorrin Jackson on 12/14/2021 09:39:28 -------------------------------------------------------------------------------- Encounter Discharge Information Details Patient Name: Date of Service: [name], [name] 12/14/2021 8:45 A M Medical Record Number: [id number] Patient Account Number: [id number] Date of Birth/Sex: Treating RN: 12-16-1965 (56 y.o. [name] Primary Care Keelan Pomerleau: [name] Other Clinician: Referring Kacin Dancy: Treating Dail Meece/Extender: [facility], [name] in Treatment: 8 Encounter Discharge Information Items Discharge  Condition: Stable Ambulatory Status: Ambulatory Discharge Destination: Home Transportation: Private Auto Schedule Follow-up Appointment: Yes Clinical Summary of Care: Provided on 12/14/2021 Form Type Recipient Paper Patient Patient Electronic Signature(s) Signed: 12/14/2021 5:26:14 PM By: Lorrin Jackson Entered By: Lorrin Jackson on 12/14/2021 09:46:36 -------------------------------------------------------------------------------- Lower Extremity Assessment Details Patient Name: Date of Service: Christian Lomb[place] [name] 12/14/2021 8:45 A M Medical Record Number: [id number] Patient Account Number: [id number] Date of Birth/Sex: Treating RN: [birth year] (56 y.o. [name] Primary Care Xeng Kucher: [name] Other Clinician: Referring Denasia Venn: Treating Katrina Brosh/Extender: [facility], [name] in Treatment: 8 Edema Assessment Assessed: [Left: No] [Right: No] Edema: [Left: Ye] [Right: s] Calf Left: Right: Point of Measurement: 37 cm From Medial Instep 38 cm Ankle Left: Right: Point of Measurement: 9 cm From Medial Instep 23.5 cm Vascular Assessment Pulses: Dorsalis Pedis Palpable: [Right:Yes] Electronic Signature(s) Signed: 12/14/2021 5:26:14 PM By: Lorrin Jackson Entered By: Lorrin Jackson on 12/14/2021 09:00:34 -------------------------------------------------------------------------------- Multi Wound Chart Details Patient Name: Date of Service: [name], [name] [name] 12/14/2021 8:45 A M Medical Record Number: [id number] Patient Account Number: [id number] Date of Birth/Sex: Treating RN: 1965-12-06 (56 y.o. [name] Primary Care Wasyl Dornfeld: [name] Other Clinician: Referring Noha Milberger: Treating Chelsey Kimberley/Extender: [facility], [name] in Treatment: 8 Vital Signs Height(in): 69 Capillary Blood Glucose(mg/dl): 179 Weight(lbs): 180 Pulse(bpm): 78 Body Mass Index(BMI): 26.6 Blood Pressure(mmHg):  143/82 Temperature(F): 98.6 Respiratory Rate(breaths/min): 16 Photos: [N/A:N/A] Right, Medial T Great oe N/A N/A Wound Location: Gradually Appeared N/A N/A Wounding Event: Diabetic Wound/Ulcer of the Lower N/A N/A Primary Etiology: Extremity Hypertension, Type II Diabetes, N/A N/A Comorbid History: Osteomyelitis, Neuropathy 10/10/2021 N/A N/A Date Acquired: 8 N/A N/A Weeks of Treatment: Open N/A N/A Wound Status: No N/A N/A Wound Recurrence: 0.8x0.5x1.8 N/A N/A Measurements L x W x D (cm) 0.314 N/A N/A A (cm) : rea 0.565 N/A N/A  0 Wound Tracing (instead of photographs) X- 1 5 Simple Wound Measurement - one wound '[]'  - 0 Complex Wound Measurement - multiple wounds INTERVENTIONS - Wound Dressings X - Small Wound Dressing one or multiple wounds 1 10 '[]'  - 0 Medium Wound Dressing one or multiple wounds '[]'  - 0 Large Wound Dressing one or multiple wounds '[]'  - 0 Application of Medications - topical '[]'  - 0 Application of Medications - injection INTERVENTIONS - Miscellaneous '[]'  - 0 External ear exam '[]'  - 0 Specimen Collection (cultures, biopsies, blood, body fluids, etc.) '[]'  - 0 Specimen(s) / Culture(s) sent or taken to Lab for analysis '[]'  - 0 Patient Transfer (multiple staff / Civil Service fast streamer / Similar devices) '[]'  - 0 Simple Staple / Suture removal (25 or less) '[]'  - 0 Complex Staple / Suture removal (26 or more) '[]'  - 0 Hypo / Hyperglycemic Management (close monitor of Blood Glucose) '[]'  - 0 Ankle / Brachial Index (ABI) - do not check if billed separately X- 1 5 Vital Signs Has the patient been seen at the hospital within the last three years: Yes Total Score: 80 Level Of Care: New/Established - Level 3 Electronic Signature(s) Signed: 12/14/2021 5:26:14 PM By: Lorrin Jackson Entered By: Lorrin Jackson on 12/14/2021 09:39:28 -------------------------------------------------------------------------------- Encounter Discharge Information Details Patient Name: Date of Service: [name], [name] 12/14/2021 8:45 A M Medical Record Number: [id number] Patient Account Number: [id number] Date of Birth/Sex: Treating RN: 12-16-1965 (56 y.o. [name] Primary Care Keelan Pomerleau: [name] Other Clinician: Referring Kacin Dancy: Treating Dail Meece/Extender: [facility], [name] in Treatment: 8 Encounter Discharge Information Items Discharge  Condition: Stable Ambulatory Status: Ambulatory Discharge Destination: Home Transportation: Private Auto Schedule Follow-up Appointment: Yes Clinical Summary of Care: Provided on 12/14/2021 Form Type Recipient Paper Patient Patient Electronic Signature(s) Signed: 12/14/2021 5:26:14 PM By: Lorrin Jackson Entered By: Lorrin Jackson on 12/14/2021 09:46:36 -------------------------------------------------------------------------------- Lower Extremity Assessment Details Patient Name: Date of Service: Christian Lombard [name] 12/14/2021 8:45 A M Medical Record Number: [id number] Patient Account Number: [id number] Date of Birth/Sex: Treating RN: [birth year] (56 y.o. [name] Primary Care Xeng Kucher: [name] Other Clinician: Referring Denasia Venn: Treating Katrina Brosh/Extender: [facility], [name] in Treatment: 8 Edema Assessment Assessed: [Left: No] [Right: No] Edema: [Left: Ye] [Right: s] Calf Left: Right: Point of Measurement: 37 cm From Medial Instep 38 cm Ankle Left: Right: Point of Measurement: 9 cm From Medial Instep 23.5 cm Vascular Assessment Pulses: Dorsalis Pedis Palpable: [Right:Yes] Electronic Signature(s) Signed: 12/14/2021 5:26:14 PM By: Lorrin Jackson Entered By: Lorrin Jackson on 12/14/2021 09:00:34 -------------------------------------------------------------------------------- Multi Wound Chart Details Patient Name: Date of Service: [name], [name] [name] 12/14/2021 8:45 A M Medical Record Number: [id number] Patient Account Number: [id number] Date of Birth/Sex: Treating RN: 1965-12-06 (56 y.o. [name] Primary Care Wasyl Dornfeld: [name] Other Clinician: Referring Noha Milberger: Treating Chelsey Kimberley/Extender: [facility], [name] in Treatment: 8 Vital Signs Height(in): 69 Capillary Blood Glucose(mg/dl): 179 Weight(lbs): 180 Pulse(bpm): 78 Body Mass Index(BMI): 26.6 Blood Pressure(mmHg):  143/82 Temperature(F): 98.6 Respiratory Rate(breaths/min): 16 Photos: [N/A:N/A] Right, Medial T Great oe N/A N/A Wound Location: Gradually Appeared N/A N/A Wounding Event: Diabetic Wound/Ulcer of the Lower N/A N/A Primary Etiology: Extremity Hypertension, Type II Diabetes, N/A N/A Comorbid History: Osteomyelitis, Neuropathy 10/10/2021 N/A N/A Date Acquired: 8 N/A N/A Weeks of Treatment: Open N/A N/A Wound Status: No N/A N/A Wound Recurrence: 0.8x0.5x1.8 N/A N/A Measurements L x W x D (cm) 0.314 N/A N/A A (cm) : rea 0.565 N/A N/A  SOUA, CALTAGIRONE ([id number]) Visit Report for 12/14/2021 Arrival Information Details Patient Name: Date of Service: [name] [facility] 12/14/2021 8:45 A M Medical Record Number: [id number] Patient Account Number: [id number] Date of Birth/Sex: Treating RN: [birth year] (56 y.o. [name] Primary Care Devaris Quirk: [name] Other Clinician: Referring Francois Elk: Treating Julius Matus/Extender: [facility], [name] in Treatment: 8 Visit Information History Since Last Visit Added or deleted any medications: No Patient Arrived: Ambulatory Any new allergies or adverse reactions: No Arrival Time: 08:55 Had a fall or experienced change in No Transfer Assistance: None activities of daily living that may affect Patient Requires Transmission-Based Precautions: No risk of falls: Patient Has Alerts: No Signs or symptoms of abuse/neglect since last visito No Hospitalized since last visit: No Implantable device outside of the clinic excluding No cellular tissue based products placed in the center since last visit: Has Dressing in Place as Prescribed: Yes Pain Present Now: No Electronic Signature(s) Signed: 12/14/2021 5:26:14 PM By: Lorrin Jackson Entered By: Lorrin Jackson on 12/14/2021 08:56:05 -------------------------------------------------------------------------------- Clinic Level of Care Assessment Details Patient Name: Date of Service: [name] [facility] 12/14/2021 8:45 A M Medical Record Number: [id number] Patient Account Number: [id number] Date of Birth/Sex: Treating RN: 1966/06/11 (56 y.o. [name] Primary Care Juwann Sherk: [name] Other Clinician: Referring Broden Holt: Treating Bao Bazen/Extender: [facility], [name] in Treatment: 8 Clinic Level of Care Assessment Items TOOL 4 Quantity Score X- 1 0 Use when only an EandM is performed on FOLLOW-UP visit ASSESSMENTS - Nursing Assessment / Reassessment X- 1  10 Reassessment of Co-morbidities (includes updates in patient status) X- 1 5 Reassessment of Adherence to Treatment Plan ASSESSMENTS - Wound and Skin A ssessment / Reassessment X - Simple Wound Assessment / Reassessment - one wound 1 5 '[]'  - 0 Complex Wound Assessment / Reassessment - multiple wounds '[]'  - 0 Dermatologic / Skin Assessment (not related to wound area) ASSESSMENTS - Focused Assessment '[]'  - 0 Circumferential Edema Measurements - multi extremities '[]'  - 0 Nutritional Assessment / Counseling / Intervention '[]'  - 0 Lower Extremity Assessment (monofilament, tuning fork, pulses) '[]'  - 0 Peripheral Arterial Disease Assessment (using hand held doppler) ASSESSMENTS - Ostomy and/or Continence Assessment and Care '[]'  - 0 Incontinence Assessment and Management '[]'  - 0 Ostomy Care Assessment and Management (repouching, etc.) PROCESS - Coordination of Care '[]'  - 0 Simple Patient / Family Education for ongoing care X- 1 20 Complex (extensive) Patient / Family Education for ongoing care X- 1 10 Staff obtains Programmer, systems, Records, T Results / Process Orders est '[]'  - 0 Staff telephones HHA, Nursing Homes / Clarify orders / etc '[]'  - 0 Routine Transfer to another Facility (non-emergent condition) '[]'  - 0 Routine Hospital Admission (non-emergent condition) '[]'  - 0 New Admissions / Biomedical engineer / Ordering NPWT Apligraf, etc. , '[]'  - 0 Emergency Hospital Admission (emergent condition) '[]'  - 0 Simple Discharge Coordination '[]'  - 0 Complex (extensive) Discharge Coordination PROCESS - Special Needs '[]'  - 0 Pediatric / Minor Patient Management '[]'  - 0 Isolation Patient Management '[]'  - 0 Hearing / Language / Visual special needs '[]'  - 0 Assessment of Community assistance (transportation, D/C planning, etc.) '[]'  - 0 Additional assistance / Altered mentation '[]'  - 0 Support Surface(s) Assessment (bed, cushion, seat, etc.) INTERVENTIONS - Wound Cleansing / Measurement X - Simple  Wound Cleansing - one wound 1 5 '[]'  - 0 Complex Wound Cleansing - multiple wounds X- 1 5 Wound Imaging (photographs - any number of wounds) '[]'  -

## 2021-12-22 ENCOUNTER — Ambulatory Visit (HOSPITAL_BASED_OUTPATIENT_CLINIC_OR_DEPARTMENT_OTHER): Payer: No Typology Code available for payment source | Admitting: Internal Medicine

## 2021-12-27 ENCOUNTER — Ambulatory Visit (INDEPENDENT_AMBULATORY_CARE_PROVIDER_SITE_OTHER): Payer: No Typology Code available for payment source | Admitting: Podiatry

## 2021-12-27 ENCOUNTER — Ambulatory Visit (INDEPENDENT_AMBULATORY_CARE_PROVIDER_SITE_OTHER): Payer: No Typology Code available for payment source

## 2021-12-27 DIAGNOSIS — M869 Osteomyelitis, unspecified: Secondary | ICD-10-CM | POA: Diagnosis not present

## 2021-12-27 DIAGNOSIS — I96 Gangrene, not elsewhere classified: Secondary | ICD-10-CM

## 2021-12-28 ENCOUNTER — Telehealth: Payer: Self-pay | Admitting: Urology

## 2021-12-28 ENCOUNTER — Encounter (HOSPITAL_BASED_OUTPATIENT_CLINIC_OR_DEPARTMENT_OTHER): Payer: No Typology Code available for payment source | Admitting: Internal Medicine

## 2021-12-28 DIAGNOSIS — E11621 Type 2 diabetes mellitus with foot ulcer: Secondary | ICD-10-CM | POA: Diagnosis not present

## 2021-12-28 DIAGNOSIS — L97514 Non-pressure chronic ulcer of other part of right foot with necrosis of bone: Secondary | ICD-10-CM

## 2021-12-28 DIAGNOSIS — M86071 Acute hematogenous osteomyelitis, right ankle and foot: Secondary | ICD-10-CM | POA: Diagnosis not present

## 2021-12-28 NOTE — Progress Notes (Signed)
[name], [name] ([id number]) Visit Report for 12/28/2021 Chief Complaint Document Details Patient Name: Date of Service: [name], [name] 9:30 A M Medical Record Number: [id number] Patient Account Number: [id number] Date of Birth/Sex: Treating RN: 1966/01/25 (56 y.o. Lytle MichaelsM) Barnhart, Jodi [facility] [facility] [place] Other Clinician: Referring Provider: Treating Provider/Extender: Geralyn CorwinHoffman, Dante [place]debush [facility] [place] in Treatment: 10 Information Obtained from: Patient Chief Complaint 10/19/2021; Right great toe wound Electronic Signature(s) Signed: 12/28/2021 10:56:15 AM By: Geralyn CorwinHoffman, Jakara Blatter DO Entered By: Geralyn CorwinHoffman, Makelle Marrone on 12/28/2021 10:49:37 -------------------------------------------------------------------------------- HPI Details Patient Name: Date of Service: [name], [name] 12/28/2021 9:30 A M Medical Record Number: [id number] Patient Account Number: [id number] Date of Birth/Sex: Treating RN: 1966/01/25 (56 y.o. 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 He had an MRI of the right foot on 10/16/2021 that showed signal changes in the first proximal and distal phalanx consistent with acute osteomyelitis. He states that he developed a wound spontaneously to this area 10 days ago. He does have significant peripheral neuropathy and does not have sensation to the wound area or bottom of his feet. He reports being on 3 different antibiotics including clindamycin and doxycycline in the past couple of weeks. He is currently on Augmentin  based on wound cultures done in the ED. He has currently been keeping the area covered. He currently denies systemic signs of infection. 4/25; patient presents for follow-up. HBO orders have been placed and we are awaiting [facility] approval. He obtained his chest x-ray with no cardiopulmonary abnormalities. He has been using Dakin's wet-to-dry dressings to the toe wound. He currently denies systemic signs of infection. He is currently taking Augmentin. He has an infectious disease appointment on 5/2. He has not heard back about his ABIs with TBI's. 5/2; patient presents for follow-up. He has been using Dakin's wet-to-dry dressings without issues. He continues to take Augmentin daily. He saw Dr. Algis LimingVandam, infectious disease today. At this time Augmentin is being continued. He has not heard to schedule his ABIs with TBI's. He currently denies signs of infection. He has been approved for HBO treatment and will start in 1 week. 5/11; patient presents for follow-up. He has been using Dakin's wet-to-dry dressings without issues. He reports doing well with HBO therapy. Today he cannot make it to his treatment however will be back Friday. He has been consistent in his care. He currently denies signs of infection. 5/18; patient presents for follow-up. He has no issues or complaints today. He has not reached out to establish care with an endocrinologist. He does not [place] He states he will let me know which providers are covered under his secondary insurance so we can place a new referral. He has been using Dakin's wet-to-dry dressings to the right great toe wound. He has been completing HBO therapy without issues. He currently denies signs of infection. 5/25; patient presents for follow-up. He has no issues or complaints today. He has been using Dakin's wet-to-dry dressings to the right great toe wound. There is more maceration today. He has no complaints about  HBO therapy. He denies systemic signs of  infection. He continues antibiotics per ID. He has not scheduled his ABIs with TBI's yet. 6/1; patient presents for follow-up. He has been doing gentamicin ointment to the wound bed daily. He reports improvement in wound healing. He continues HBO therapy without issues. He is working on getting his blood glucose levels under control. 6/8; patient is seen today in conjunction with HBO. He is tolerating this well. 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 Involving the right great toe. His wound is on the medial aspect of the right great toe and the first second webspace. He has been using topical gentamicin and gauze. He comes in today with the wound looking benign however significant undermining. This does not probe to bone 6/15; patient presents for follow-up. He was seen by Dr. Leanord Hawking at last clinic visit. He debrided the wound and removed callus and reports there was drainage underneath. He cultured this and it grew E. coli resistant to ampicillin and sensitive to Bactrim. Patient is on Augmentin for osteomyelitis of the right great toe. Bone was not exposed before and today bone is protruding out of the wound bed. He currently denies systemic signs of infection. He has been using gentamicin and silver alginate to the wound bed. 6/20; patient presents for follow-up. He finished his Bactrim without any issues. He has been referred to Dr. Ardelle Anton however [facility] not [facility] this [facility]. He has been using Dakin's wet-to-dry dressings to the wound bed. 6/29; patient presents for follow-up. He finished the second round of Bactrim without any issues. He is scheduled to have surgery to amputate the right great toe on July 10 with Dr. Allena Katz. He has no issues or complaints today. He has been using Betadine dressings to the wound bed. Electronic Signature(s) Signed: 12/28/2021 10:56:15 AM By: [name] DO Entered By: [name] on 12/28/2021 10:50:35 -------------------------------------------------------------------------------- Physical Exam Details Patient Name: Date of Service: Christian Regal [name], [name] 12/28/2021 9:30 A M Medical Record Number: [id number] Patient Account Number: [id number] Date of Birth/Sex: Treating RN: 13-Jun-1966 (56 y.o. Lytle Michaels Primary Care Provider: [name] Other Clinician: Referring Provider: Treating Provider/Extender: [name] [facility] [name] Treatment: 10 Constitutional respirations regular, non-labored and within target range for patient.. Cardiovascular 2+ dorsalis pedis/posterior tibialis pulses. Psychiatric pleasant and cooperative. Notes Right foot: T the medial aspect of the right great toe there is an open wound that probes to bone. o No purulent drainage, increased warmth or erythema. Electronic Signature(s) Signed: 12/28/2021 10:56:15 AM By: [name] DO Entered By: [name] on 12/28/2021 10:51:19 -------------------------------------------------------------------------------- Physician Orders Details Patient Name: Date of Service: Christian Regal [name], [name] 12/28/2021 9:30 A M Medical Record Number: [id number] Patient Account Number: [id number] Date of Birth/Sex: Treating RN: 09-22-1965 (56 y.o. Lytle Michaels Primary Care Provider: [name] Other Clinician: Referring Provider: Treating Provider/Extender: [name] [facility] [name] in Treatment: 10 Verbal / Phone Orders: No Diagnosis Coding Discharge From [facility] Services Discharge from Wound Care Center - No further follow up needed, call if anything changes. Notes Great toe amputation scheduled for 01/08/22 Electronic Signature(s) Signed: 12/28/2021 10:56:15 AM By: [name] DO Entered By: [name] on 12/28/2021 10:51:25 -------------------------------------------------------------------------------- Problem List  Details Patient Name: Date of Service: Christian Regal [name], [name] 12/28/2021 9:30 A M Medical Record Number: [id number] Patient Account Number: [id number] Date of Birth/Sex: Treating RN: [birth year] (56 y.o. Lytle Michaels Primary Care Provider: [name] Other Clinician: Referring Provider: Treating Provider/Extender: [name] [facility], [place]  infection. He continues antibiotics per ID. He has not scheduled his ABIs with TBI's yet. 6/1; patient presents for follow-up. He has been doing gentamicin ointment to the wound bed daily. He reports improvement in wound healing. He continues HBO therapy without issues. He is working on getting his blood glucose levels under control. 6/8; patient is seen today in conjunction with HBO. He is tolerating this well. 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 Involving the right great toe. His wound is on the medial aspect of the right great toe and the first second webspace. He has been using topical gentamicin and gauze. He comes in today with the wound looking benign however significant undermining. This does not probe to bone 6/15; patient presents for follow-up. He was seen by Dr. Leanord Hawking at last clinic visit. He debrided the wound and removed callus and reports there was drainage underneath. He cultured this and it grew E. coli resistant to ampicillin and sensitive to Bactrim. Patient is on Augmentin for osteomyelitis of the right great toe. Bone was not exposed before and today bone is protruding out of the wound bed. He currently denies systemic signs of infection. He has been using gentamicin and silver alginate to the wound bed. 6/20; patient presents for follow-up. He finished his Bactrim without any issues. He has been referred to Dr. Ardelle Anton however [facility] not [facility]. He has been using Dakin's wet-to-dry dressings to the wound bed. 6/29; patient presents for follow-up. He finished the second round of Bactrim without any issues. He is scheduled to have surgery to amputate the right great toe on July 10 with Dr. Allena Katz. He has no issues or complaints today. He has been using Betadine dressings to the wound bed. Electronic Signature(s) Signed: 12/28/2021 10:56:15 AM By: [name] DO Entered By: [name] on 12/28/2021 10:50:35 -------------------------------------------------------------------------------- Physical Exam Details Patient Name: Date of Service: Christian Regal [name], [name] 12/28/2021 9:30 A M Medical Record Number: [id number] Patient Account Number: [id number] Date of Birth/Sex: Treating RN: 13-Jun-1966 (56 y.o. Lytle Michaels Primary Care Provider: [name] Other Clinician: Referring Provider: Treating Provider/Extender: [name] [facility] [name] Treatment: 10 Constitutional respirations regular, non-labored and within target range for patient.. Cardiovascular 2+ dorsalis pedis/posterior tibialis pulses. Psychiatric pleasant and cooperative. Notes Right foot: T the medial aspect of the right great toe there is an open wound that probes to bone. o No purulent drainage, increased warmth or erythema. Electronic Signature(s) Signed: 12/28/2021 10:56:15 AM By: [name] DO Entered By: [name] on 12/28/2021 10:51:19 -------------------------------------------------------------------------------- Physician Orders Details Patient Name: Date of Service: Christian Regal [name], [name] 12/28/2021 9:30 A M Medical Record Number: [id number] Patient Account Number: [id number] Date of Birth/Sex: Treating RN: 09-22-1965 (56 y.o. Lytle Michaels Primary Care Provider: [name] Other Clinician: Referring Provider: Treating Provider/Extender: [name] [facility] [name] Treatment: 10 Verbal / Phone Orders: No Diagnosis Coding Discharge From [facility] Services Discharge from Wound Care Center - No further follow up needed, call if anything changes. Notes Great toe amputation scheduled for 01/08/22 Electronic Signature(s) Signed: 12/28/2021 10:56:15 AM By: [name] DO Entered By: [name] on 12/28/2021 10:51:25 -------------------------------------------------------------------------------- Problem List  Details Patient Name: Date of Service: Christian Regal [name], [name] 12/28/2021 9:30 A M Medical Record Number: [id number] Patient Account Number: [id number] Date of Birth/Sex: Treating RN: [birth year] (56 y.o. Lytle Michaels Primary Care Provider: [name] Other Clinician: Referring Provider: Treating Provider/Extender: [name] [facility], [place]  infection. He continues antibiotics per ID. He has not scheduled his ABIs with TBI's yet. 6/1; patient presents for follow-up. He has been doing gentamicin ointment to the wound bed daily. He reports improvement in wound healing. He continues HBO therapy without issues. He is working on getting his blood glucose levels under control. 6/8; patient is seen today in conjunction with HBO. He is tolerating this well. 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 Involving the right great toe. His wound is on the medial aspect of the right great toe and the first second webspace. He has been using topical gentamicin and gauze. He comes in today with the wound looking benign however significant undermining. This does not probe to bone 6/15; patient presents for follow-up. He was seen by Dr. Leanord Hawking at last clinic visit. He debrided the wound and removed callus and reports there was drainage underneath. He cultured this and it grew E. coli resistant to ampicillin and sensitive to Bactrim. Patient is on Augmentin for osteomyelitis of the right great toe. Bone was not exposed before and today bone is protruding out of the wound bed. He currently denies systemic signs of infection. He has been using gentamicin and silver alginate to the wound bed. 6/20; patient presents for follow-up. He finished his Bactrim without any issues. He has been referred to Dr. Ardelle Anton however [facility] not [facility]. He has been using Dakin's wet-to-dry dressings to the wound bed. 6/29; patient presents for follow-up. He finished the second round of Bactrim without any issues. He is scheduled to have surgery to amputate the right great toe on July 10 with Dr. Allena Katz. He has no issues or complaints today. He has been using Betadine dressings to the wound bed. Electronic Signature(s) Signed: 12/28/2021 10:56:15 AM By: [name] DO Entered By: [name] on 12/28/2021 10:50:35 -------------------------------------------------------------------------------- Physical Exam Details Patient Name: Date of Service: Christian Regal [name], [name] 12/28/2021 9:30 A M Medical Record Number: [id number] Patient Account Number: [id number] Date of Birth/Sex: Treating RN: 13-Jun-1966 (56 y.o. Lytle Michaels Primary Care Provider: [name] Other Clinician: Referring Provider: Treating Provider/Extender: [name] [facility] [name] in Treatment: 10 Constitutional respirations regular, non-labored and within target range for patient.. Cardiovascular 2+ dorsalis pedis/posterior tibialis pulses. Psychiatric pleasant and cooperative. Notes Right foot: T the medial aspect of the right great toe there is an open wound that probes to bone. o No purulent drainage, increased warmth or erythema. Electronic Signature(s) Signed: 12/28/2021 10:56:15 AM By: [name] DO Entered By: [name] on 12/28/2021 10:51:19 -------------------------------------------------------------------------------- Physician Orders Details Patient Name: Date of Service: Christian Regal [name], [name] 12/28/2021 9:30 A M Medical Record Number: [id number] Patient Account Number: [id number] Date of Birth/Sex: Treating RN: 09-22-1965 (56 y.o. Lytle Michaels Primary Care Provider: [name] Other Clinician: Referring Provider: Treating Provider/Extender: [name] [facility] [name] in Treatment: 10 Verbal / Phone Orders: No Diagnosis Coding Discharge From [facility] Services Discharge from Wound Care Center - No further follow up needed, call if anything changes. Notes Great toe amputation scheduled for 01/08/22 Electronic Signature(s) Signed: 12/28/2021 10:56:15 AM By: [name] DO Entered By: [name] on 12/28/2021 10:51:25 -------------------------------------------------------------------------------- Problem List  Details Patient Name: Date of Service: Christian Regal [name], [name] 12/28/2021 9:30 A M Medical Record Number: [id number] Patient Account Number: [id number] Date of Birth/Sex: Treating RN: [birth year] (56 y.o. Lytle Michaels Primary Care Provider: [name] Other Clinician: Referring Provider: Treating Provider/Extender: [name] [facility], [place]  infection. He continues antibiotics per ID. He has not scheduled his ABIs with TBI's yet. 6/1; patient presents for follow-up. He has been doing gentamicin ointment to the wound bed daily. He reports improvement in wound healing. He continues HBO therapy without issues. He is working on getting his blood glucose levels under control. 6/8; patient is seen today in conjunction with HBO. He is tolerating this well. 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 Involving the right great toe. His wound is on the medial aspect of the right great toe and the first second webspace. He has been using topical gentamicin and gauze. He comes in today with the wound looking benign however significant undermining. This does not probe to bone 6/15; patient presents for follow-up. He was seen by Dr. Leanord Hawking at last clinic visit. He debrided the wound and removed callus and reports there was drainage underneath. He cultured this and it grew E. coli resistant to ampicillin and sensitive to Bactrim. Patient is on Augmentin for osteomyelitis of the right great toe. Bone was not exposed before and today bone is protruding out of the wound bed. He currently denies systemic signs of infection. He has been using gentamicin and silver alginate to the wound bed. 6/20; patient presents for follow-up. He finished his Bactrim without any issues. He has been referred to Dr. Ardelle Anton however [facility]. He has been using Dakin's wet-to-dry dressings to the wound bed. 6/29; patient presents for follow-up. He finished the second round of Bactrim without any issues. He is scheduled to have surgery to amputate the right great toe on July 10 with Dr. Allena Katz. He has no issues or complaints today. He has been using Betadine dressings to the wound bed. Electronic Signature(s) Signed: 12/28/2021 10:56:15 AM By: [name] DO Entered By: [name] on 12/28/2021 10:50:35 -------------------------------------------------------------------------------- Physical Exam Details Patient Name: Date of Service: Christian Regal [name], [name] 12/28/2021 9:30 A M Medical Record Number: [id number] Patient Account Number: [id number] Date of Birth/Sex: Treating RN: 13-Jun-1966 (56 y.o. Lytle Michaels Primary Care Provider: [name] Other Clinician: Referring Provider: Treating Provider/Extender: [name] [facility] [name] in Treatment: 10 Constitutional respirations regular, non-labored and within target range for patient.. Cardiovascular 2+ dorsalis pedis/posterior tibialis pulses. Psychiatric pleasant and cooperative. Notes Right foot: T the medial aspect of the right great toe there is an open wound that probes to bone. o No purulent drainage, increased warmth or erythema. Electronic Signature(s) Signed: 12/28/2021 10:56:15 AM By: [name] DO Entered By: [name] on 12/28/2021 10:51:19 -------------------------------------------------------------------------------- Physician Orders Details Patient Name: Date of Service: Christian Regal [name], [name] 12/28/2021 9:30 A M Medical Record Number: [id number] Patient Account Number: [id number] Date of Birth/Sex: Treating RN: 09-22-1965 (56 y.o. Lytle Michaels Primary Care Provider: [name] Other Clinician: Referring Provider: Treating Provider/Extender: [name] [facility], [name] in Treatment: 10 Verbal / Phone Orders: No Diagnosis Coding Discharge From [facility] Services Discharge from Wound Care Center - No further follow up needed, call if anything changes. Notes Great toe amputation scheduled for 01/08/22 Electronic Signature(s) Signed: 12/28/2021 10:56:15 AM By: [name] DO Entered By: [name] on 12/28/2021 10:51:25 -------------------------------------------------------------------------------- Problem List  Details Patient Name: Date of Service: Christian Regal [name], [name] 12/28/2021 9:30 A M Medical Record Number: [id number] Patient Account Number: [id number] Date of Birth/Sex: Treating RN: [birth year] (56 y.o. Lytle Michaels Primary Care Provider: [name] Other Clinician: Referring Provider: Treating Provider/Extender: [name] [facility], [place]

## 2021-12-28 NOTE — Telephone Encounter (Signed)
DOS - 01/08/22  AMPUTATION TOE 1ST RIGHT --- 91660  [employer] EFFECTIVE DATE - 09/30/21  PLAN DEDUCTIBLE -  OUT OF POCKET - $5,900.00 W/ $5,813.70 REMAINING COINSURANCE - 0% COPAY - $345.00   PER [employer] WEBSITE FOR CPT CODE 60045 The following codes do not require a pre-authorization.

## 2021-12-29 NOTE — Progress Notes (Signed)
Muscle Exposed: No Joint Exposed: No Bone Exposed: Yes Electronic Signature(s) Signed: 12/28/2021 4:43:44 PM By: Antonieta Iba Entered By: Antonieta Iba on 12/28/2021 10:39:24 -------------------------------------------------------------------------------- Vitals Details Patient Name: Date of Service: [name], [name] 12/28/2021 9:30 A M Medical Record Number:  [id number] Patient Account Number: [id number] Date of Birth/Sex: Treating RN: February 23, 1966 (56 y.o. Lucious Groves Primary Care Zaydenn Balaguer: Clinic, [name] Other Clinician: Referring Solenne Manwarren: Treating Lasonja Lakins/Extender: [name], [name] in Treatment: 10 Vital Signs Time Taken: 10:21 Temperature (F): 98 Height (in): 69 Pulse (bpm): 92 Weight (lbs): 180 Respiratory Rate (breaths/min): 17 Body Mass Index (BMI): 26.6 Blood Pressure (mmHg): 146/91 Reference Range: 80 - 120 mg / dl Electronic Signature(s) Signed: 12/29/2021 11:43:29 AM By: Fonnie Mu RN Entered By: Fonnie Mu on 12/28/2021 10:22:02  Muscle Exposed: No Joint Exposed: No Bone Exposed: Yes Electronic Signature(s) Signed: 12/28/2021 4:43:44 PM By: Antonieta Iba Entered By: Antonieta Iba on 12/28/2021 10:39:24 -------------------------------------------------------------------------------- Vitals Details Patient Name: Date of Service: [name], [name] 12/28/2021 9:30 A M Medical Record Number:  [id number] Patient Account Number: [id number] Date of Birth/Sex: Treating RN: February 23, 1966 (56 y.o. Lucious Groves Primary Care Zaydenn Balaguer: Clinic, [name] Other Clinician: Referring Solenne Manwarren: Treating Lasonja Lakins/Extender: [name], [name] in Treatment: 10 Vital Signs Time Taken: 10:21 Temperature (F): 98 Height (in): 69 Pulse (bpm): 92 Weight (lbs): 180 Respiratory Rate (breaths/min): 17 Body Mass Index (BMI): 26.6 Blood Pressure (mmHg): 146/91 Reference Range: 80 - 120 mg / dl Electronic Signature(s) Signed: 12/29/2021 11:43:29 AM By: Fonnie Mu RN Entered By: Fonnie Mu on 12/28/2021 10:22:02  STEPHANOS, TIET ([id number]) Visit Report for 12/28/2021 Arrival Information Details Patient Name: Date of Service: [name] [facility] 12/28/2021 9:30 A M Medical Record Number: [id number] Patient Account Number: [id number] Date of Birth/Sex: Treating RN: 05-30-1966 (56 y.o. Burnadette Pop, [name] Primary Care Lindzie Boxx: Clinic, [name] Other Clinician: Referring Jyles Sontag: Treating Cordelro Gautreau/Extender: Kalman Shan Clinic, [name] in Treatment: 10 Visit Information History Since Last Visit Added or deleted any medications: No Patient Arrived: Ambulatory Any new allergies or adverse reactions: No Arrival Time: 10:21 Had a fall or experienced change in No Accompanied By: self activities of daily living that may affect Transfer Assistance: None risk of falls: Patient Identification Verified: Yes Signs or symptoms of abuse/neglect since last visito No Secondary Verification Process Completed: Yes Hospitalized since last visit: No Patient Requires Transmission-Based Precautions: No Implantable device outside of the clinic excluding No Patient Has Alerts: No cellular tissue based products placed in the center since last visit: Has Dressing in Place as Prescribed: Yes Pain Present Now: No Electronic Signature(s) Signed: 12/29/2021 11:43:29 AM By: Rhae Hammock RN Entered By: Rhae Hammock on 12/28/2021 10:21:43 -------------------------------------------------------------------------------- Clinic Level of Care Assessment Details Patient Name: Date of Service: [facility] Gracy Bruins [facility] 12/28/2021 9:30 A M Medical Record Number: [id number] Patient Account Number: [id number] Date of Birth/Sex: Treating RN: [birth year] (56 y.o. [name] Primary Care Dazha Kempa: Clinic, [name] Other Clinician: Referring Shenise Wolgamott: Treating Murlene Revell/Extender: [facility], [name] in Treatment: 10 Clinic Level of Care Assessment Items TOOL 4 Quantity Score X-  1 0 Use when only an EandM is performed on FOLLOW-UP visit ASSESSMENTS - Nursing Assessment / Reassessment X- 1 10 Reassessment of Co-morbidities (includes updates in patient status) X- 1 5 Reassessment of Adherence to Treatment Plan ASSESSMENTS - Wound and Skin A ssessment / Reassessment X - Simple Wound Assessment / Reassessment - one wound 1 5 []  - 0 Complex Wound Assessment / Reassessment - multiple wounds []  - 0 Dermatologic / Skin Assessment (not related to wound area) ASSESSMENTS - Focused Assessment []  - 0 Circumferential Edema Measurements - multi extremities []  - 0 Nutritional Assessment / Counseling / Intervention []  - 0 Lower Extremity Assessment (monofilament, tuning fork, pulses) []  - 0 Peripheral Arterial Disease Assessment (using hand held doppler) ASSESSMENTS - Ostomy and/or Continence Assessment and Care []  - 0 Incontinence Assessment and Management []  - 0 Ostomy Care Assessment and Management (repouching, etc.) PROCESS - Coordination of Care X - Simple Patient / Family Education for ongoing care 1 15 []  - 0 Complex (extensive) Patient / Family Education for ongoing care []  - 0 Staff obtains Programmer, systems, Records, T Results / Process Orders est []  - 0 Staff telephones HHA, Nursing Homes / Clarify orders / etc []  - 0 Routine Transfer to another Facility (non-emergent condition) []  - 0 Routine Hospital Admission (non-emergent condition) []  - 0 New Admissions / Biomedical engineer / Ordering NPWT Apligraf, etc. , []  - 0 Emergency Hospital Admission (emergent condition) X- 1 10 Simple Discharge Coordination []  - 0 Complex (extensive) Discharge Coordination PROCESS - Special Needs []  - 0 Pediatric / Minor Patient Management []  - 0 Isolation Patient Management []  - 0 Hearing / Language / Visual special needs []  - 0 Assessment of Community assistance (transportation, D/C planning, etc.) []  - 0 Additional assistance / Altered mentation []  -  0 Support Surface(s) Assessment (bed, cushion, seat, etc.) INTERVENTIONS - Wound Cleansing / Measurement X - Simple Wound Cleansing - one wound 1 5 []  - 0 Complex Wound Cleansing -  multiple wounds X- 1 5 Wound Imaging (photographs - any number of wounds) []  - 0 Wound Tracing (instead of photographs) X- 1 5 Simple Wound Measurement - one wound []  - 0 Complex Wound Measurement - multiple wounds INTERVENTIONS - Wound Dressings X - Small Wound Dressing one or multiple wounds 1 10 []  - 0 Medium Wound Dressing one or multiple wounds []  - 0 Large Wound Dressing one or multiple wounds []  - 0 Application of Medications - topical []  - 0 Application of Medications - injection INTERVENTIONS - Miscellaneous []  - 0 External ear exam []  - 0 Specimen Collection (cultures, biopsies, blood, body fluids, etc.) []  - 0 Specimen(s) / Culture(s) sent or taken to Lab for analysis []  - 0 Patient Transfer (multiple staff / Civil Service fast streamer / Similar devices) []  - 0 Simple Staple / Suture removal (25 or less) []  - 0 Complex Staple / Suture removal (26 or more) []  - 0 Hypo / Hyperglycemic Management (close monitor of Blood Glucose) []  - 0 Ankle / Brachial Index (ABI) - do not check if billed separately X- 1 5 Vital Signs Has the patient been seen at the hospital within the last three years: Yes Total Score: 75 Level Of Care: New/Established - Level 2 Electronic Signature(s) Signed: 12/28/2021 4:43:44 PM By: Lorrin Jackson Entered By: Lorrin Jackson on 12/28/2021 10:42:35 -------------------------------------------------------------------------------- Encounter Discharge Information Details Patient Name: Date of Service: [name] RD, [place] HN 12/28/2021 9:30 A M Medical Record Number: [id number] Patient Account Number: [id number] Date of Birth/Sex: Treating RN: 08-May-1966 (56 y.o. [name] Primary Care Clayton Jarmon: Clinic, [name] Other Clinician: Referring Lakota Markgraf: Treating  Cassey Hurrell/Extender: [facility], [name] in Treatment: 10 Encounter Discharge Information Items Discharge Condition: Stable Ambulatory Status: Ambulatory Discharge Destination: Home Transportation: Private Auto Schedule Follow-up Appointment: Yes Clinical Summary of Care: Provided on 12/28/2021 Form Type Recipient Paper Patient Patient Electronic Signature(s) Signed: 12/28/2021 4:43:44 PM By: Lorrin Jackson Entered By: Lorrin Jackson on 12/28/2021 10:43:09 -------------------------------------------------------------------------------- Lower Extremity Assessment Details Patient Name: Date of Service: [name] [facility] 12/28/2021 9:30 A M Medical Record Number: [id number] Patient Account Number: [id number] Date of Birth/Sex: Treating RN: 01/29/1966 (56 y.o. [name] Primary Care Jackye Dever: Clinic, [name] Other Clinician: Referring Matan Steen: Treating Emmerich Cryer/Extender: [facility], [name] in Treatment: 10 Edema Assessment Assessed: [Left: No] [Right: Yes] Edema: [Left: Ye] [Right: s] Calf Left: Right: Point of Measurement: 37 cm From Medial Instep 37 cm Ankle Left: Right: Point of Measurement: 9 cm From Medial Instep 24 cm Vascular Assessment Pulses: Dorsalis Pedis Palpable: [Right:Yes] Electronic Signature(s) Signed: 12/28/2021 4:43:44 PM By: Lorrin Jackson Entered By: Lorrin Jackson on 12/28/2021 10:25:33 -------------------------------------------------------------------------------- Multi Wound Chart Details Patient Name: Date of Service: [name] RD, [place] HN 12/28/2021 9:30 A M Medical Record Number: [id number] Patient Account Number: [id number] Date of Birth/Sex: Treating RN: Jan 13, 1966 (56 y.o. [name] Primary Care Renwick Asman: Clinic, [name] Other Clinician: Referring Stephine Langbehn: Treating Parth Mccormac/Extender: [facility], [name] in Treatment: 10 Vital Signs Height(in):  54 Pulse(bpm): 63 Weight(lbs): 180 Blood Pressure(mmHg): 146/91 Body Mass Index(BMI): 26.6 Temperature(F): 98 Respiratory Rate(breaths/min): 17 Photos: [N/A:N/A] Right, Medial T Great oe N/A N/A Wound Location: Gradually Appeared N/A N/A Wounding Event: Diabetic Wound/Ulcer of the Lower N/A N/A Primary Etiology: Extremity Hypertension, Type II Diabetes, N/A N/A Comorbid History: Osteomyelitis, Neuropathy 10/10/2021 N/A N/A Date Acquired: 10 N/A N/A Weeks of Treatment: Amputation - Anticipated N/A N/A Wound Status: No N/A N/A Wound Recurrence: 0.4x0.4x1.8 N/A N/A Measurements L x W

## 2022-01-03 NOTE — Progress Notes (Signed)
Subjective:  Patient ID: [name], male    DOB: August 25, 1965,  MRN: [id number]  No chief complaint on file.   56 y.o. male presents with the above complaint.  Patient presents with complaint of right great digit gangrene.  Patient states that he is a diabetic with uncontrolled A1c.  Patient states that he has done local wound care is done padding protecting offloading.  He also is known to vascular who had done i studies on the leg which shows that he does have adequate flow to the right lower extremity.  He would like to have it surgically taken out.  He does not have any clinical signs of infection.  He is part of the [place] system and would like for me to take over the care for the great toe gangrene.  Pain scale is 2 out of 10.   Review of Systems: Negative except as noted in the HPI. Denies N/V/F/Ch.  Past Medical History:  Diagnosis Date   Arthritis    Diabetes mellitus    Fistula, perirectal    GERD (gastroesophageal reflux disease)    Hypertension    Osteomyelitis of great toe of right foot (HCC) 10/31/2021   Polymicrobial bacterial infection 10/31/2021   PTSD (post-traumatic stress disorder)    PTSD (post-traumatic stress disorder) 10/31/2021   Small bowel obstruction (HCC)     Current Outpatient Medications:    amLODipine (NORVASC) 10 MG tablet, Take 10 mg by mouth daily., Disp: , Rfl:    amoxicillin-clavulanate (AUGMENTIN) 875-125 MG tablet, Take 1 tablet by mouth every 12 (twelve) hours., Disp: 60 tablet, Rfl: 3   diphenhydrAMINE (BENADRYL) 25 MG tablet, Take 1 tablet (25 mg total) by mouth every 6 (six) hours as needed for itching or allergies., Disp: 12 tablet, Rfl: 0   docusate sodium (COLACE) 100 MG capsule, Take 1 capsule (100 mg total) by mouth every 12 (twelve) hours. (Patient not taking: Reported on 10/31/2021), Disp: 60 capsule, Rfl: 0   Dulaglutide (TRULICITY [place]), Inject into the skin. (Patient not taking: Reported on 10/31/2021), Disp: , Rfl:    escitalopram (LEXAPRO) 10  MG tablet, Take 10 mg by mouth daily. (Patient not taking: Reported on 10/31/2021), Disp: , Rfl:    glipiZIDE (GLUCOTROL) 10 MG tablet, Take 1 tablet (10 mg total) by mouth 2 (two) times daily before a meal. (Patient not taking: Reported on 10/31/2021), Disp: 60 tablet, Rfl: 2   HYDROCHLOROTHIAZIDE PO, Take by mouth. (Patient not taking: Reported on 12/04/2021), Disp: , Rfl:    insulin aspart protamine- aspart (NOVOLOG MIX 70/30) (70-30) 100 UNIT/ML injection, Inject into the skin. (Patient not taking: Reported on 06/23/2021), Disp: , Rfl:    lisinopril (PRINIVIL,ZESTRIL) 10 MG tablet, Take 10 mg by mouth daily. (Patient not taking: Reported on 10/31/2021), Disp: , Rfl:    loratadine (CLARITIN) 10 MG tablet, TAKE 1 TABLET BY MOUTH ONCE DAILY, Disp: 90 tablet, Rfl: 30   losartan (COZAAR) 100 MG tablet, Take 100 mg by mouth daily. (Patient not taking: Reported on 06/23/2021), Disp: , Rfl:    OMEPRAZOLE PO, Take by mouth. (Patient not taking: Reported on 10/31/2021), Disp: , Rfl:    oxyCODONE-acetaminophen (PERCOCET/ROXICET) 5-325 MG per tablet, Take 2 tablets by mouth every 4 (four) hours as needed for pain., Disp: 6 tablet, Rfl: 0   polyethylene glycol (MIRALAX / GLYCOLAX) 17 g packet, Take 17 g by mouth daily., Disp: 14 each, Rfl: 1   Semaglutide (OZEMPIC, 1 MG/DOSE, [place]), Inject 1 mg into the skin once a  week., Disp: , Rfl:    SUMAtriptan (IMITREX) 50 MG tablet, Take 50 mg by mouth every 2 (two) hours as needed for migraine or headache. May repeat in 2 hours if headache persists or recurs., Disp: , Rfl:    testosterone cypionate (DEPOTESTOTERONE CYPIONATE) 100 MG/ML injection, Inject 100 mg into the muscle every 14 (fourteen) days. For IM use only, Disp: , Rfl:    Zolpidem Tartrate (AMBIEN PO), Take by mouth., Disp: , Rfl:   Social History   Tobacco Use  Smoking Status Some Days   Types: Cigars  Smokeless Tobacco Never  Tobacco Comments   Occasional cigar    Allergies  Allergen Reactions    Lisinopril Other (See Comments)    Other reaction(s): Electrocardiogram abnormal   Sildenafil Nausea And Vomiting   Objective:  There were no vitals filed for this visit. There is no height or weight on file to calculate BMI. Constitutional Well developed. Well nourished.  Vascular Dorsalis pedis pulses palpable bilaterally. Posterior tibial pulses palpable bilaterally. Capillary refill normal to all digits.  No cyanosis or clubbing noted. Pedal hair growth normal.  Neurologic Normal speech. Oriented to person, place, and time. Epicritic sensation to light touch grossly present bilaterally.  Dermatologic Nails well groomed and normal in appearance. No open wounds. No skin lesions.  Orthopedic: Right second digit gangrene noted dry stable.  No signs of purulent drainage noted no redness noted.  It is actively demarcated up to the metatarsophalangeal joint.  No further progression noted.   Radiographs: 3 views of skeletally mature the right foot: Osteomyelitic changes noted with breakdown of the proximal phalanx bone.  No soft tissue emphysema or gas noted.  No other bony abnormalities identifiedNo involvement of osteomyelitis to the first metatarsal phalangeal joint noted. Assessment:   1. Osteomyelitis of great toe of right foot (HCC)   2. Gangrene of toe of right foot (HCC)    Plan:  Patient was evaluated and treated and all questions answered.  Right hallux gangrene with underlying osteomyelitis -All questions and concerns were discussed with the patient in extensive detail -Patient is ABIs PVRs were reviewed which show adequate flow to the right lower extremity in the setting of palpable pulses. -I discussed with the patient he will benefit from partial first ray amputation given the underlying osteomyelitis as well as gangrene.  Patient agrees with plan like to proceed with surgery.  He still is uncontrolled diabetic and therefore I discussed the risk of dehiscence and  complication.  He states understanding would like to proceed despite the complication he understands that he could lose his foot or leg from this procedure as well.  He states understanding. -I will do this as an outpatient given that this is dry and stable. -Informed surgical risk consent was reviewed and read aloud to the patient.  I reviewed the films.  I have discussed my findings with the patient in great detail.  I have discussed all risks including but not limited to infection, stiffness, scarring, limp, disability, deformity, damage to blood vessels and nerves, numbness, poor healing, need for braces, arthritis, chronic pain, amputation, death.  All benefits and realistic expectations discussed in great detail.  I have made no promises as to the outcome.  I have provided realistic expectations.  I have offered the patient a 2nd opinion, which they have declined and assured me they preferred to proceed despite the risks   No follow-ups on file.

## 2022-01-08 ENCOUNTER — Encounter: Payer: Self-pay | Admitting: Podiatry

## 2022-01-08 ENCOUNTER — Telehealth: Payer: Self-pay | Admitting: Podiatry

## 2022-01-08 DIAGNOSIS — I96 Gangrene, not elsewhere classified: Secondary | ICD-10-CM | POA: Diagnosis not present

## 2022-01-08 NOTE — Telephone Encounter (Signed)
Pts wife called stating pt had surgery today and needs a note for work. She said Dr Allena Katz told them they could make it as long as needed. Pts wife is asking for 30 days. She would like it emailed to her [email or web address]

## 2022-01-09 ENCOUNTER — Encounter: Payer: Self-pay | Admitting: Podiatry

## 2022-01-10 ENCOUNTER — Other Ambulatory Visit: Payer: Self-pay | Admitting: Podiatry

## 2022-01-10 ENCOUNTER — Other Ambulatory Visit: Payer: Self-pay

## 2022-01-10 MED ORDER — OXYCODONE-ACETAMINOPHEN 5-325 MG PO TABS: 1.0000 | ORAL_TABLET | ORAL | 0 refills | Status: DC | PRN

## 2022-01-10 MED ORDER — SULFAMETHOXAZOLE-TRIMETHOPRIM 800-160 MG PO TABS: 1.0000 | ORAL_TABLET | Freq: Two times a day (BID) | ORAL | 0 refills | Status: AC

## 2022-01-17 ENCOUNTER — Ambulatory Visit (INDEPENDENT_AMBULATORY_CARE_PROVIDER_SITE_OTHER): Payer: No Typology Code available for payment source | Admitting: Podiatry

## 2022-01-17 ENCOUNTER — Ambulatory Visit (INDEPENDENT_AMBULATORY_CARE_PROVIDER_SITE_OTHER): Payer: No Typology Code available for payment source

## 2022-01-17 DIAGNOSIS — Z9889 Other specified postprocedural states: Secondary | ICD-10-CM

## 2022-01-17 DIAGNOSIS — M869 Osteomyelitis, unspecified: Secondary | ICD-10-CM | POA: Diagnosis not present

## 2022-01-17 MED ORDER — OXYCODONE-ACETAMINOPHEN 10-325 MG PO TABS: 1.0000 | ORAL_TABLET | ORAL | 0 refills | Status: DC | PRN

## 2022-01-17 NOTE — Progress Notes (Signed)
Subjective:  Patient ID: [name], male    DOB: [birth year],  MRN: [id number]  Chief Complaint  Patient presents with   Routine Post Op    RT 1ST RAY AMPUTATION PARTIAL    DOS: 01/08/2022 Procedure: Partial first ray amputation right foot  56 y.o. male returns for post-op check.  He states he is doing okay.  No acute complaints.  He has a lot of pain.  He is being seen by pain management but he needs medication on top of it.  He ran out of the pain management medication.  He confirmed with pain management doctor that I can give him more pain medication for postop  Review of Systems: Negative except as noted in the HPI. Denies N/V/F/Ch.  Past Medical History:  Diagnosis Date   Arthritis    Diabetes mellitus    Fistula, perirectal    GERD (gastroesophageal reflux disease)    Hypertension    Osteomyelitis of great toe of right foot (HCC) 10/31/2021   Polymicrobial bacterial infection 10/31/2021   PTSD (post-traumatic stress disorder)    PTSD (post-traumatic stress disorder) 10/31/2021   Small bowel obstruction (HCC)     Current Outpatient Medications:    oxyCODONE-acetaminophen (PERCOCET) 10-325 MG tablet, Take 1 tablet by mouth every 4 (four) hours as needed for pain., Disp: 30 tablet, Rfl: 0   amLODipine (NORVASC) 10 MG tablet, Take 10 mg by mouth daily., Disp: , Rfl:    amoxicillin-clavulanate (AUGMENTIN) 875-125 MG tablet, Take 1 tablet by mouth every 12 (twelve) hours., Disp: 60 tablet, Rfl: 3   diphenhydrAMINE (BENADRYL) 25 MG tablet, Take 1 tablet (25 mg total) by mouth every 6 (six) hours as needed for itching or allergies., Disp: 12 tablet, Rfl: 0   docusate sodium (COLACE) 100 MG capsule, Take 1 capsule (100 mg total) by mouth every 12 (twelve) hours. (Patient not taking: Reported on 10/31/2021), Disp: 60 capsule, Rfl: 0   Dulaglutide (TRULICITY [place]), Inject into the skin. (Patient not taking: Reported on 10/31/2021), Disp: , Rfl:    escitalopram (LEXAPRO) 10 MG tablet, Take 10  mg by mouth daily. (Patient not taking: Reported on 10/31/2021), Disp: , Rfl:    glipiZIDE (GLUCOTROL) 10 MG tablet, Take 1 tablet (10 mg total) by mouth 2 (two) times daily before a meal. (Patient not taking: Reported on 10/31/2021), Disp: 60 tablet, Rfl: 2   HYDROCHLOROTHIAZIDE PO, Take by mouth. (Patient not taking: Reported on 12/04/2021), Disp: , Rfl:    insulin aspart protamine- aspart (NOVOLOG MIX 70/30) (70-30) 100 UNIT/ML injection, Inject into the skin. (Patient not taking: Reported on 06/23/2021), Disp: , Rfl:    lisinopril (PRINIVIL,ZESTRIL) 10 MG tablet, Take 10 mg by mouth daily. (Patient not taking: Reported on 10/31/2021), Disp: , Rfl:    loratadine (CLARITIN) 10 MG tablet, TAKE 1 TABLET BY MOUTH ONCE DAILY, Disp: 90 tablet, Rfl: 30   losartan (COZAAR) 100 MG tablet, Take 100 mg by mouth daily. (Patient not taking: Reported on 06/23/2021), Disp: , Rfl:    OMEPRAZOLE PO, Take by mouth. (Patient not taking: Reported on 10/31/2021), Disp: , Rfl:    oxyCODONE-acetaminophen (PERCOCET) 5-325 MG tablet, Take 1 tablet by mouth every 4 (four) hours as needed for severe pain., Disp: 30 tablet, Rfl: 0   oxyCODONE-acetaminophen (PERCOCET/ROXICET) 5-325 MG per tablet, Take 2 tablets by mouth every 4 (four) hours as needed for pain., Disp: 6 tablet, Rfl: 0   polyethylene glycol (MIRALAX / GLYCOLAX) 17 g packet, Take 17 g by mouth daily., Disp:  14 each, Rfl: 1   Semaglutide (OZEMPIC, 1 MG/DOSE, [place]), Inject 1 mg into the skin once a week., Disp: , Rfl:    sulfamethoxazole-trimethoprim (BACTRIM DS) 800-160 MG tablet, Take 1 tablet by mouth 2 (two) times daily for 14 days., Disp: 28 tablet, Rfl: 0   SUMAtriptan (IMITREX) 50 MG tablet, Take 50 mg by mouth every 2 (two) hours as needed for migraine or headache. May repeat in 2 hours if headache persists or recurs., Disp: , Rfl:    testosterone cypionate (DEPOTESTOTERONE CYPIONATE) 100 MG/ML injection, Inject 100 mg into the muscle every 14 (fourteen) days. For IM  use only, Disp: , Rfl:    Zolpidem Tartrate (AMBIEN PO), Take by mouth., Disp: , Rfl:   Social History   Tobacco Use  Smoking Status Some Days   Types: Cigars  Smokeless Tobacco Never  Tobacco Comments   Occasional cigar    Allergies  Allergen Reactions   Lisinopril Other (See Comments)    Other reaction(s): Electrocardiogram abnormal   Sildenafil Nausea And Vomiting   Objective:  There were no vitals filed for this visit. There is no height or weight on file to calculate BMI. Constitutional Well developed. Well nourished.  Vascular Foot warm and well perfused. Capillary refill normal to all digits.   Neurologic Normal speech. Oriented to person, place, and time. Epicritic sensation to light touch grossly present bilaterally.  Dermatologic Skin healing well without signs of infection. Skin edges well coapted without signs of infection.  Orthopedic: Tenderness to palpation noted about the surgical site.   Radiographs: 3 views of skeletally mature the right foot: Partial first ray amputation noted.  No signs of osteomyelitis noted no soft tissue or gas noted Assessment:   1. Osteomyelitis of great toe of right foot (HCC)   2. S/P foot surgery    Plan:  Patient was evaluated and treated and all questions answered.  S/p foot surgery right -Progressing as expected post-operatively. -XR: See above -WB Status: Weightbearing as tolerated in cam boot -Sutures: Intact.  No clinical signs of Deis is noted no complication noted. -Medications: Percocet -Foot redressed.  No follow-ups on file.

## 2022-01-18 ENCOUNTER — Telehealth: Payer: Self-pay | Admitting: *Deleted

## 2022-01-18 MED ORDER — TRAMADOL HCL 50 MG PO TABS: 50.0000 mg | ORAL_TABLET | Freq: Three times a day (TID) | ORAL | 0 refills | Status: AC | PRN

## 2022-01-18 NOTE — Telephone Encounter (Signed)
Patient is calling to ask why his pain medication was denied at pharmacy. Called and spoke with pharmacist and they said that it was too soon to fill, [facility]). Informed the patient and he said that he had to take more than prescribed and is in pain. He also said that he sees a pain management doctor,called but she is out of town.  Is there something that he can take until he is able to get prescription from pharmacy? Please advise.

## 2022-01-18 NOTE — Telephone Encounter (Signed)
Patient's wife is checking on status of pain medicine and if it has been sent to pharmacy. Physician has sent in medication (Tramadol-50 mg), she was notified.

## 2022-01-22 ENCOUNTER — Telehealth: Payer: Self-pay | Admitting: Podiatry

## 2022-01-22 NOTE — Telephone Encounter (Signed)
See office notes from 01/19/22, pharmacy said that it is too soon to fill,patient has had pain medication from [place] of 150- tablets,filled on 01/01/22.

## 2022-01-22 NOTE — Telephone Encounter (Addendum)
Patient is out of pain medication - and the Tramadol is not helping with the pain, the [facility] is [facility] a [facility] to be [facility] for the patient.     Send prescription to the [facility] - pt takes 5 pills a day of the oxyCODONE-acetaminophen (PERCOCET) 10-325 MG tablet   They need enough to get him through today to August 2nd.    2nd call at 3:11pm from the nurse at the [facility], stating patient needs more pain medication sent in, he has been without meds for 2days.

## 2022-01-23 ENCOUNTER — Other Ambulatory Visit: Payer: Self-pay | Admitting: Podiatry

## 2022-01-23 MED ORDER — OXYCODONE-ACETAMINOPHEN 10-325 MG PO TABS: 1.0000 | ORAL_TABLET | ORAL | 0 refills | Status: DC | PRN

## 2022-01-23 NOTE — Telephone Encounter (Signed)
Pt called back asking if the [facility] to [facility] and upon checking it was sent this morning with confirmation from pharmacy electronically. He said thank you so much

## 2022-01-23 NOTE — Telephone Encounter (Signed)
Noted, thanks Dawn

## 2022-01-31 ENCOUNTER — Ambulatory Visit (INDEPENDENT_AMBULATORY_CARE_PROVIDER_SITE_OTHER): Payer: No Typology Code available for payment source | Admitting: Podiatry

## 2022-01-31 DIAGNOSIS — Z9889 Other specified postprocedural states: Secondary | ICD-10-CM

## 2022-01-31 DIAGNOSIS — M869 Osteomyelitis, unspecified: Secondary | ICD-10-CM

## 2022-01-31 NOTE — Progress Notes (Signed)
Subjective:  Patient ID: [name], male    DOB: 01/25/1966,  MRN: [id number]  Chief Complaint  Patient presents with   Routine Post Op    POV #2 DOS 01/08/2022 RT 1ST RAY AMPUTATION PARTIAL    DOS: 01/08/2022 Procedure: Partial first ray amputation right foot  56 y.o. male returns for post-op check.  He states he is doing okay.  No acute complaints.  He has been doing okay his pain being better managed.  He has been talking his pain management doctor as well.  Review of Systems: Negative except as noted in the HPI. Denies N/V/F/Ch.  Past Medical History:  Diagnosis Date   Arthritis    Diabetes mellitus    Fistula, perirectal    GERD (gastroesophageal reflux disease)    Hypertension    Osteomyelitis of great toe of right foot (HCC) 10/31/2021   Polymicrobial bacterial infection 10/31/2021   PTSD (post-traumatic stress disorder)    PTSD (post-traumatic stress disorder) 10/31/2021   Small bowel obstruction (HCC)     Current Outpatient Medications:    AMLODIPINE BESYLATE PO, Take 10 mg by mouth daily., Disp: , Rfl:    escitalopram (LEXAPRO) 10 MG tablet, Take 10 mg by mouth daily. (Patient not taking: Reported on 10/31/2021), Disp: , Rfl:    gabapentin (NEURONTIN) 800 MG tablet, Take 800 mg by mouth QID., Disp: , Rfl:    hydrOXYzine (VISTARIL) 25 MG capsule, Take 25 mg by mouth 3 (three) times daily as needed. One tablet daily, Disp: , Rfl:    insulin glargine (LANTUS) 100 UNIT/ML injection, Inject 30 Units into the skin daily. Take 30 units once daily, Disp: , Rfl:    losartan (COZAAR) 100 MG tablet, Take 100 mg by mouth daily. (Patient not taking: Reported on 06/23/2021), Disp: , Rfl:    omeprazole (PRILOSEC) 40 MG capsule, Take 40 mg by mouth daily., Disp: , Rfl:    OMEPRAZOLE PO, Take by mouth. (Patient not taking: Reported on 10/31/2021), Disp: , Rfl:    oxyCODONE-acetaminophen (PERCOCET) 10-325 MG tablet, Take 1 tablet by mouth every 4 (four) hours as needed for pain. 1 q 6 hours  and 1 at bedtime, Disp: , Rfl:    oxyCODONE-acetaminophen (PERCOCET) 10-325 MG tablet, Take 1 tablet by mouth every 4 (four) hours as needed for pain., Disp: 60 tablet, Rfl: 0   oxyCODONE-acetaminophen (PERCOCET) 5-325 MG tablet, Take 1 tablet by mouth every 4 (four) hours as needed for severe pain., Disp: 30 tablet, Rfl: 0   polyethylene glycol (MIRALAX / GLYCOLAX) 17 g packet, Take 17 g by mouth daily., Disp: 14 each, Rfl: 1   Semaglutide (OZEMPIC, 1 MG/DOSE, [place]), Inject 1 mg into the skin once a week., Disp: , Rfl:    sertraline (ZOLOFT) 25 MG tablet, Take 25 mg by mouth daily. Per patient is taking 1/2 tablet daily, Disp: , Rfl:    spironolactone (ALDACTONE) 25 MG tablet, Take 25 mg by mouth daily., Disp: , Rfl:    Zolpidem Tartrate (AMBIEN PO), Take by mouth., Disp: , Rfl:   Social History   Tobacco Use  Smoking Status Some Days   Types: Cigars  Smokeless Tobacco Never  Tobacco Comments   Occasional cigar    Allergies  Allergen Reactions   Lisinopril Other (See Comments)    Other reaction(s): Electrocardiogram abnormal   Sildenafil Nausea And Vomiting   Objective:  There were no vitals filed for this visit. There is no height or weight on file to calculate BMI. Constitutional Well  developed. Well nourished.  Vascular Foot warm and well perfused. Capillary refill normal to all digits.   Neurologic Normal speech. Oriented to person, place, and time. Epicritic sensation to light touch grossly present bilaterally.  Dermatologic Clinically reepithelialized.  No further dehiscence is noted.  No signs of recurrence ulceration noted.  Orthopedic: No further tenderness to palpation noted about the surgical site.   Radiographs: 3 views of skeletally mature the right foot: Partial first ray amputation noted.  No signs of osteomyelitis noted no soft tissue or gas noted Assessment:   1. Osteomyelitis of great toe of right foot (HCC)   2. S/P foot surgery     Plan:  Patient was  evaluated and treated and all questions answered.  S/p foot surgery right -Clinically healed and officially discharged from my care if any foot and ankle issues were future asked him to come back and see me.  He states understanding.  I discussed shoe gear modification as well.  No follow-ups on file. Healed and discharged

## 2022-02-01 ENCOUNTER — Telehealth: Payer: Self-pay | Admitting: *Deleted

## 2022-02-01 NOTE — Telephone Encounter (Signed)
Called patient to give recommendations for showering, he will keep [place] updated as to if he needs to still come in after seeing his [facility] doctor.

## 2022-02-01 NOTE — Telephone Encounter (Signed)
Patient is calling because his post procedural foot was bleeding  bandage this morning,has been elevating and  took shower night before. Ask if he needed to come in today but can't going to [place] today ,will get the nurse there to look at. He is wanting to know if he can continue to take showers,please advise.

## 2022-02-05 ENCOUNTER — Ambulatory Visit: Payer: No Typology Code available for payment source | Admitting: Infectious Disease

## 2022-03-02 ENCOUNTER — Telehealth: Payer: Self-pay | Admitting: *Deleted

## 2022-03-02 NOTE — Telephone Encounter (Signed)
Patient is requesting an appointment for an xray f/u per his [facility] doctor.

## 2022-03-06 ENCOUNTER — Telehealth: Payer: Self-pay | Admitting: Podiatry

## 2022-03-06 NOTE — Telephone Encounter (Signed)
Pt left message today @ 1249 stating he needed to r/s appt for tomorrow with Dr Allena Katz.  I returned call and left message for pt to call to get that appt rescheduled.

## 2022-03-07 ENCOUNTER — Encounter: Payer: No Typology Code available for payment source | Admitting: Podiatry

## 2022-03-16 ENCOUNTER — Ambulatory Visit (INDEPENDENT_AMBULATORY_CARE_PROVIDER_SITE_OTHER): Payer: No Typology Code available for payment source | Admitting: Podiatry

## 2022-03-16 ENCOUNTER — Ambulatory Visit (INDEPENDENT_AMBULATORY_CARE_PROVIDER_SITE_OTHER): Payer: No Typology Code available for payment source

## 2022-03-16 DIAGNOSIS — M869 Osteomyelitis, unspecified: Secondary | ICD-10-CM

## 2022-03-16 DIAGNOSIS — Z9889 Other specified postprocedural states: Secondary | ICD-10-CM | POA: Diagnosis not present

## 2022-03-16 NOTE — Progress Notes (Signed)
Formatting of this note is different from the original.  Images from the original note were not included.    Subjective:   Patient ID: [name] [name], male    DOB: [birth year],  MRN: [id number]    Chief Complaint   Patient presents with    Routine Post Op     DOS: 01/08/2022  Procedure: Partial first ray amputation right foot    56 y.o. male returns for post-op check.  He states he is doing okay.  No acute complaints.  He is doing okay.  No further pain denies any other acute issues.    Review of Systems: Negative except as noted in the HPI. Denies N/V/F/Ch.    Past Medical History:   Diagnosis Date    Arthritis     Diabetes mellitus     Fistula, perirectal     GERD (gastroesophageal reflux disease)     Hypertension     Osteomyelitis of great toe of right foot (HCC) 10/31/2021    Polymicrobial bacterial infection 10/31/2021    PTSD (post-traumatic stress disorder)     PTSD (post-traumatic stress disorder) 10/31/2021    Small bowel obstruction (HCC)      Current Outpatient Medications:     AMLODIPINE BESYLATE PO, Take 10 mg by mouth daily., Disp: , Rfl:     escitalopram (LEXAPRO) 10 MG tablet, Take 10 mg by mouth daily. (Patient not taking: Reported on 10/31/2021), Disp: , Rfl:     gabapentin (NEURONTIN) 800 MG tablet, Take 800 mg by mouth QID., Disp: , Rfl:     hydrOXYzine (VISTARIL) 25 MG capsule, Take 25 mg by mouth 3 (three) times daily as needed. One tablet daily, Disp: , Rfl:     insulin glargine (LANTUS) 100 UNIT/ML injection, Inject 30 Units into the skin daily. Take 30 units once daily, Disp: , Rfl:     losartan (COZAAR) 100 MG tablet, Take 100 mg by mouth daily. (Patient not taking: Reported on 06/23/2021), Disp: , Rfl:     omeprazole (PRILOSEC) 40 MG capsule, Take 40 mg by mouth daily., Disp: , Rfl:     OMEPRAZOLE PO, Take by mouth. (Patient not taking: Reported on 10/31/2021), Disp: , Rfl:     oxyCODONE-acetaminophen (PERCOCET) 10-325 MG tablet, Take 1 tablet by mouth every 4 (four) hours as needed for pain. 1 q 6 hours  and 1 at bedtime, Disp: , Rfl:     oxyCODONE-acetaminophen (PERCOCET) 10-325 MG tablet, Take 1 tablet by mouth every 4 (four) hours as needed for pain., Disp: 60 tablet, Rfl: 0    oxyCODONE-acetaminophen (PERCOCET) 5-325 MG tablet, Take 1 tablet by mouth every 4 (four) hours as needed for severe pain., Disp: 30 tablet, Rfl: 0    polyethylene glycol (MIRALAX / GLYCOLAX) 17 g packet, Take 17 g by mouth daily., Disp: 14 each, Rfl: 1    Semaglutide (OZEMPIC, 1 MG/DOSE, [place]), Inject 1 mg into the skin once a week., Disp: , Rfl:     sertraline (ZOLOFT) 25 MG tablet, Take 25 mg by mouth daily. Per patient is taking 1/2 tablet daily, Disp: , Rfl:     spironolactone (ALDACTONE) 25 MG tablet, Take 25 mg by mouth daily., Disp: , Rfl:     Zolpidem Tartrate (AMBIEN PO), Take by mouth., Disp: , Rfl:     Social History     Tobacco Use   Smoking Status Some Days    Types: Cigars   Smokeless Tobacco Never   Tobacco Comments    Occasional cigar  Allergies   Allergen Reactions    Lisinopril Other (See Comments)     Other reaction(s): Electrocardiogram abnormal    Sildenafil Nausea And Vomiting     Objective:   There were no vitals filed for this visit.  There is no height or weight on file to calculate BMI.  Constitutional Well developed.  Well nourished.   Vascular Foot warm and well perfused.  Capillary refill normal to all digits.    Neurologic Normal speech.  Oriented to person, place, and time.  Epicritic sensation to light touch grossly present bilaterally.   Dermatologic Clinically reepithelialized.  No further dehiscence is noted.  No signs of recurrence ulceration noted.   Orthopedic: No further tenderness to palpation noted about the surgical site.     Radiographs: 3 views of skeletally mature the right foot: Partial first ray amputation noted.  No signs of osteomyelitis noted no soft tissue or gas noted  Assessment:     1. S/P foot surgery      Plan:   Patient was evaluated and treated and all questions answered.    S/p  foot surgery right  -Clinically healed and officially discharged from my care if any foot and ankle issues were future asked him to come back and see me.  He states understanding.  I discussed shoe gear modification as well.  -Your prescription was given to the patient for diabetic shoes with insoles with filler.    No follow-ups on file. Healed and discharged  Electronically signed by Felipa Furnace, DPM at 03/16/2022 11:37 AM EDT

## 2022-03-16 NOTE — Progress Notes (Signed)
Subjective:  Patient ID: [name], male    DOB: 1966/03/19,  MRN: [id number]  Chief Complaint  Patient presents with   Routine Post Op    DOS: 01/08/2022 Procedure: Partial first ray amputation right foot  56 y.o. male returns for post-op check.  He states he is doing okay.  No acute complaints.  He is doing okay.  No further pain denies any other acute issues.  Review of Systems: Negative except as noted in the HPI. Denies N/V/F/Ch.  Past Medical History:  Diagnosis Date   Arthritis    Diabetes mellitus    Fistula, perirectal    GERD (gastroesophageal reflux disease)    Hypertension    Osteomyelitis of great toe of right foot (HCC) 10/31/2021   Polymicrobial bacterial infection 10/31/2021   PTSD (post-traumatic stress disorder)    PTSD (post-traumatic stress disorder) 10/31/2021   Small bowel obstruction (HCC)     Current Outpatient Medications:    AMLODIPINE BESYLATE PO, Take 10 mg by mouth daily., Disp: , Rfl:    escitalopram (LEXAPRO) 10 MG tablet, Take 10 mg by mouth daily. (Patient not taking: Reported on 10/31/2021), Disp: , Rfl:    gabapentin (NEURONTIN) 800 MG tablet, Take 800 mg by mouth QID., Disp: , Rfl:    hydrOXYzine (VISTARIL) 25 MG capsule, Take 25 mg by mouth 3 (three) times daily as needed. One tablet daily, Disp: , Rfl:    insulin glargine (LANTUS) 100 UNIT/ML injection, Inject 30 Units into the skin daily. Take 30 units once daily, Disp: , Rfl:    losartan (COZAAR) 100 MG tablet, Take 100 mg by mouth daily. (Patient not taking: Reported on 06/23/2021), Disp: , Rfl:    omeprazole (PRILOSEC) 40 MG capsule, Take 40 mg by mouth daily., Disp: , Rfl:    OMEPRAZOLE PO, Take by mouth. (Patient not taking: Reported on 10/31/2021), Disp: , Rfl:    oxyCODONE-acetaminophen (PERCOCET) 10-325 MG tablet, Take 1 tablet by mouth every 4 (four) hours as needed for pain. 1 q 6 hours and 1 at bedtime, Disp: , Rfl:    oxyCODONE-acetaminophen (PERCOCET) 10-325 MG tablet, Take 1 tablet  by mouth every 4 (four) hours as needed for pain., Disp: 60 tablet, Rfl: 0   oxyCODONE-acetaminophen (PERCOCET) 5-325 MG tablet, Take 1 tablet by mouth every 4 (four) hours as needed for severe pain., Disp: 30 tablet, Rfl: 0   polyethylene glycol (MIRALAX / GLYCOLAX) 17 g packet, Take 17 g by mouth daily., Disp: 14 each, Rfl: 1   Semaglutide (OZEMPIC, 1 MG/DOSE, [place]), Inject 1 mg into the skin once a week., Disp: , Rfl:    sertraline (ZOLOFT) 25 MG tablet, Take 25 mg by mouth daily. Per patient is taking 1/2 tablet daily, Disp: , Rfl:    spironolactone (ALDACTONE) 25 MG tablet, Take 25 mg by mouth daily., Disp: , Rfl:    Zolpidem Tartrate (AMBIEN PO), Take by mouth., Disp: , Rfl:   Social History   Tobacco Use  Smoking Status Some Days   Types: Cigars  Smokeless Tobacco Never  Tobacco Comments   Occasional cigar    Allergies  Allergen Reactions   Lisinopril Other (See Comments)    Other reaction(s): Electrocardiogram abnormal   Sildenafil Nausea And Vomiting   Objective:  There were no vitals filed for this visit. There is no height or weight on file to calculate BMI. Constitutional Well developed. Well nourished.  Vascular Foot warm and well perfused. Capillary refill normal to all digits.   Neurologic Normal  speech. Oriented to person, place, and time. Epicritic sensation to light touch grossly present bilaterally.  Dermatologic Clinically reepithelialized.  No further dehiscence is noted.  No signs of recurrence ulceration noted.  Orthopedic: No further tenderness to palpation noted about the surgical site.   Radiographs: 3 views of skeletally mature the right foot: Partial first ray amputation noted.  No signs of osteomyelitis noted no soft tissue or gas noted Assessment:   1. S/P foot surgery     Plan:  Patient was evaluated and treated and all questions answered.  S/p foot surgery right -Clinically healed and officially discharged from my care if any foot and ankle  issues were future asked him to come back and see me.  He states understanding.  I discussed shoe gear modification as well. -Your prescription was given to the patient for diabetic shoes with insoles with filler.  No follow-ups on file. Healed and discharged

## 2022-04-20 ENCOUNTER — Emergency Department: Admit: 2022-04-21 | Primary: Internal Medicine

## 2022-04-20 DIAGNOSIS — S161XXA Strain of muscle, fascia and tendon at neck level, initial encounter: Secondary | ICD-10-CM
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
## 2022-04-20 NOTE — ED Notes (Signed)
Provider- Hilaria Ota, MD is in triage with PT and myself while finishing triage process.     PT is A/O x4, respirations even and unlabored. No distress noted at this time     Marla Roe, RN  04/21/22 [phone]
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
## 2022-04-21 ENCOUNTER — Inpatient Hospital Stay
Admit: 2022-04-21 | Discharge: 2022-04-21 | Disposition: A | Discharge status: Home / Self Care | Attending: Emergency Medicine

## 2022-04-21 ENCOUNTER — Emergency Department: Admit: 2022-04-21 | Primary: Internal Medicine

## 2022-04-21 MED ORDER — MORPHINE SULFATE (PF) 4 MG/ML IJ SOLN
4 MG/ML | INTRAMUSCULAR | Status: AC
Start: 2022-04-21 — End: 2022-04-21
  Administered 2022-04-21: 05:00:00 4 mg via INTRAMUSCULAR

## 2022-04-21 MED ORDER — NAPROXEN 500 MG PO TABS
500 | ORAL_TABLET | Freq: Two times a day (BID) | ORAL | 0 refills | 14.00000 days | Status: DC
Start: 2022-04-21 — End: 2024-04-03

## 2022-04-21 MED ORDER — KETOROLAC TROMETHAMINE 15 MG/ML IJ SOLN
15 MG/ML | Freq: Once | INTRAMUSCULAR | Status: AC
Start: 2022-04-21 — End: 2022-04-20
  Administered 2022-04-21: 04:00:00 15 mg via INTRAMUSCULAR

## 2022-04-21 MED ORDER — METHOCARBAMOL 750 MG PO TABS
750 MG | ORAL_TABLET | Freq: Three times a day (TID) | ORAL | 0 refills | Status: AC
Start: 2022-04-21 — End: 2022-04-28

## 2022-04-21 MED FILL — KETOROLAC TROMETHAMINE 15 MG/ML IJ SOLN: 15 MG/ML | INTRAMUSCULAR | Qty: 1

## 2022-04-21 MED FILL — MORPHINE SULFATE 4 MG/ML IJ SOLN: 4 mg/mL | INTRAMUSCULAR | Qty: 1

## 2022-04-21 NOTE — ED Notes (Signed)
I have reviewed discharge instructions with the patient.  The patient verbalized understanding.    Patient left ED via Discharge Method: ambulatory to Home with  self and male family memeber      Opportunity for questions and clarification provided.       Patient given 2 scripts.         To continue your aftercare when you leave the hospital, you may receive an automated call from our care team to check in on how you are doing.  This is a free service and part of our promise to provide the best care and service to meet your aftercare needs." If you have questions, or wish to unsubscribe from this service please call [phone].  Thank you for Choosing our [facility] Emergency Department.        Adline Peals, RN  04/21/22 0120

## 2022-04-21 NOTE — ED Notes (Signed)
Georgina Snell RN came to this nurse to update me about this patient, Patient had concerns related to the new process and stated he is usually brought straight back to a room and has always had great care. He states he feels a certain type of way. He was assisted to a treatment area for more privacy.I listened to concerns . I collected information from the patient that could be provided to the management team. He does state he would like to talk with someone.    Patient does state he has pain still and that the previous medication has not helped.    Dr Hilaria Ota wrote for more pain medication.  Patient states he does have someone to drive him home.   He is satisfied with the treatment area he is in at this time therefore he will stay until the provider dispos him .        Adline Peals, RN  04/21/22 [phone]

## 2022-04-21 NOTE — Discharge Instructions (Signed)
You may take the medication as directed.  You may also use over the counter pain patches as discussed.  Apply alternating ice and heat to any sore areas for 20 minutes every few hours.  Return to the ER for any new or worsening symptoms or if you develop any numbness in your groin or any issues controlling your bowel or bladder.  Do not drive while taking the muscle relaxer medication as it can make you sleepy

## 2022-04-23 ENCOUNTER — Emergency Department (HOSPITAL_BASED_OUTPATIENT_CLINIC_OR_DEPARTMENT_OTHER)
Admission: EM | Admit: 2022-04-23 | Discharge: 2022-04-23 | Disposition: A | Discharge status: Home / Self Care | Payer: Medicare PPO | Attending: Emergency Medicine | Admitting: Emergency Medicine

## 2022-04-23 ENCOUNTER — Encounter (HOSPITAL_BASED_OUTPATIENT_CLINIC_OR_DEPARTMENT_OTHER): Payer: Self-pay | Admitting: Emergency Medicine

## 2022-04-23 ENCOUNTER — Emergency Department (HOSPITAL_BASED_OUTPATIENT_CLINIC_OR_DEPARTMENT_OTHER): Payer: Medicare PPO

## 2022-04-23 ENCOUNTER — Other Ambulatory Visit: Payer: Self-pay

## 2022-04-23 DIAGNOSIS — Z794 Long term (current) use of insulin: Secondary | ICD-10-CM | POA: Diagnosis not present

## 2022-04-23 DIAGNOSIS — S161XXA Strain of muscle, fascia and tendon at neck level, initial encounter: Secondary | ICD-10-CM | POA: Insufficient documentation

## 2022-04-23 DIAGNOSIS — M25552 Pain in left hip: Secondary | ICD-10-CM | POA: Insufficient documentation

## 2022-04-23 DIAGNOSIS — M79671 Pain in right foot: Secondary | ICD-10-CM | POA: Diagnosis not present

## 2022-04-23 DIAGNOSIS — Y92410 Unspecified street and highway as the place of occurrence of the external cause: Secondary | ICD-10-CM | POA: Diagnosis not present

## 2022-04-23 DIAGNOSIS — R1084 Generalized abdominal pain: Secondary | ICD-10-CM | POA: Diagnosis not present

## 2022-04-23 DIAGNOSIS — M546 Pain in thoracic spine: Secondary | ICD-10-CM | POA: Diagnosis not present

## 2022-04-23 DIAGNOSIS — M545 Low back pain, unspecified: Secondary | ICD-10-CM | POA: Diagnosis not present

## 2022-04-23 DIAGNOSIS — R519 Headache, unspecified: Secondary | ICD-10-CM | POA: Diagnosis not present

## 2022-04-23 DIAGNOSIS — S169XXA Unspecified injury of muscle, fascia and tendon at neck level, initial encounter: Secondary | ICD-10-CM | POA: Diagnosis present

## 2022-04-23 DIAGNOSIS — M25512 Pain in left shoulder: Secondary | ICD-10-CM | POA: Diagnosis not present

## 2022-04-23 LAB — BASIC METABOLIC PANEL WITH GFR
Anion gap: 5 (ref 5–15)
BUN: 15 mg/dL (ref 6–20)
CO2: 22 mmol/L (ref 22–32)
Calcium: 8.3 mg/dL — ABNORMAL LOW (ref 8.9–10.3)
Chloride: 105 mmol/L (ref 98–111)
Creatinine, Ser: 0.93 mg/dL (ref 0.61–1.24)
GFR, Estimated: >60 mL/min
Glucose, Bld: 152 mg/dL — ABNORMAL HIGH (ref 70–99)
Potassium: 4.4 mmol/L (ref 3.5–5.1)
Sodium: 132 mmol/L — ABNORMAL LOW (ref 135–145)

## 2022-04-23 LAB — CBC
HCT: 44.2 % (ref 39.0–52.0)
Hemoglobin: 15.7 g/dL (ref 13.0–17.0)
MCH: 32.4 pg (ref 26.0–34.0)
MCHC: 35.5 g/dL (ref 30.0–36.0)
MCV: 91.1 fL (ref 80.0–100.0)
Platelets: 201 K/uL (ref 150–400)
RBC: 4.85 MIL/uL (ref 4.22–5.81)
RDW: 12.3 % (ref 11.5–15.5)
WBC: 5.5 K/uL (ref 4.0–10.5)
nRBC: 0 % (ref 0.0–0.2)

## 2022-04-23 MED ORDER — IOHEXOL 300 MG/ML  SOLN
100.0000 mL | Freq: Once | INTRAMUSCULAR | Status: AC | PRN
  Administered 2022-04-23: 100 mL via INTRAVENOUS

## 2022-04-23 MED ORDER — MORPHINE SULFATE (PF) 4 MG/ML IV SOLN
4.0000 mg | Freq: Once | INTRAVENOUS | Status: AC
  Administered 2022-04-23: 4 mg via INTRAVENOUS
  Filled 2022-04-23: qty 1
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
## 2022-05-11 ENCOUNTER — Encounter (INDEPENDENT_AMBULATORY_CARE_PROVIDER_SITE_OTHER): Payer: No Typology Code available for payment source | Admitting: Ophthalmology

## 2022-05-15 ENCOUNTER — Encounter (INDEPENDENT_AMBULATORY_CARE_PROVIDER_SITE_OTHER): Payer: Self-pay | Admitting: Ophthalmology

## 2022-05-15 ENCOUNTER — Ambulatory Visit (INDEPENDENT_AMBULATORY_CARE_PROVIDER_SITE_OTHER): Payer: No Typology Code available for payment source | Admitting: Ophthalmology

## 2022-05-15 DIAGNOSIS — E113511 Type 2 diabetes mellitus with proliferative diabetic retinopathy with macular edema, right eye: Secondary | ICD-10-CM | POA: Diagnosis not present

## 2022-05-15 DIAGNOSIS — H35033 Hypertensive retinopathy, bilateral: Secondary | ICD-10-CM

## 2022-05-15 DIAGNOSIS — E113412 Type 2 diabetes mellitus with severe nonproliferative diabetic retinopathy with macular edema, left eye: Secondary | ICD-10-CM | POA: Diagnosis not present

## 2022-05-15 DIAGNOSIS — I10 Essential (primary) hypertension: Secondary | ICD-10-CM

## 2022-05-15 DIAGNOSIS — E113413 Type 2 diabetes mellitus with severe nonproliferative diabetic retinopathy with macular edema, bilateral: Secondary | ICD-10-CM

## 2022-05-15 DIAGNOSIS — H4311 Vitreous hemorrhage, right eye: Secondary | ICD-10-CM

## 2022-05-15 DIAGNOSIS — H25813 Combined forms of age-related cataract, bilateral: Secondary | ICD-10-CM

## 2022-05-15 DIAGNOSIS — H3582 Retinal ischemia: Secondary | ICD-10-CM | POA: Diagnosis not present
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
## 2022-05-16 ENCOUNTER — Encounter (INDEPENDENT_AMBULATORY_CARE_PROVIDER_SITE_OTHER): Payer: Self-pay | Admitting: Ophthalmology

## 2022-05-17 ENCOUNTER — Encounter (INDEPENDENT_AMBULATORY_CARE_PROVIDER_SITE_OTHER): Payer: Self-pay | Admitting: Ophthalmology

## 2022-05-17 ENCOUNTER — Ambulatory Visit (INDEPENDENT_AMBULATORY_CARE_PROVIDER_SITE_OTHER): Payer: No Typology Code available for payment source | Admitting: Ophthalmology

## 2022-05-17 DIAGNOSIS — H4311 Vitreous hemorrhage, right eye: Secondary | ICD-10-CM

## 2022-05-17 DIAGNOSIS — H25813 Combined forms of age-related cataract, bilateral: Secondary | ICD-10-CM

## 2022-05-17 DIAGNOSIS — H35033 Hypertensive retinopathy, bilateral: Secondary | ICD-10-CM

## 2022-05-17 DIAGNOSIS — E113412 Type 2 diabetes mellitus with severe nonproliferative diabetic retinopathy with macular edema, left eye: Secondary | ICD-10-CM | POA: Diagnosis not present

## 2022-05-17 DIAGNOSIS — E113413 Type 2 diabetes mellitus with severe nonproliferative diabetic retinopathy with macular edema, bilateral: Secondary | ICD-10-CM

## 2022-05-17 DIAGNOSIS — H3582 Retinal ischemia: Secondary | ICD-10-CM

## 2022-05-17 DIAGNOSIS — E113511 Type 2 diabetes mellitus with proliferative diabetic retinopathy with macular edema, right eye: Secondary | ICD-10-CM | POA: Diagnosis not present

## 2022-05-17 DIAGNOSIS — I10 Essential (primary) hypertension: Secondary | ICD-10-CM

## 2022-05-17 MED ORDER — BEVACIZUMAB CHEMO INJECTION 1.25MG/0.05ML SYRINGE FOR KALEIDOSCOPE
1.2500 mg | INTRAVITREAL | Status: AC | PRN
  Administered 2022-05-17: 1.25 mg via INTRAVITREAL

## 2022-05-17 NOTE — Progress Notes (Signed)
[facility] Note  05/17/2022     CHIEF COMPLAINT Patient presents for Retina Follow Up   HISTORY OF PRESENT ILLNESS: [name] is a 56 y.o. male who presents to the clinic today for:   HPI     Retina Follow Up   Patient presents with  Diabetic Retinopathy.  In both eyes.  Severity is moderate.  Duration of 2 days.  Since onset it is stable.  I, the attending physician,  performed the HPI with the patient and updated documentation appropriately.        Comments   Patient feels that the vision is the same. He rarely checks his sugars and he is unsure of the A1C - waiting for results.       Last edited by Bernarda Caffey, MD on 05/17/2022  9:37 AM.    Patient is here for IVA OU today   Referring physician: Clinic, [facility] [address] [place],  [place] [address]  HISTORICAL INFORMATION:   Selected notes from the MEDICAL RECORD NUMBER Referred by [facility] for retinal hemes LEE:  Ocular Hx- PMH-    CURRENT MEDICATIONS: No current outpatient medications on file. (Ophthalmic Drugs)   No current facility-administered medications for this visit. (Ophthalmic Drugs)   Current Outpatient Medications (Other)  Medication Sig   AMLODIPINE BESYLATE PO Take 10 mg by mouth daily.   escitalopram (LEXAPRO) 10 MG tablet Take 10 mg by mouth daily. (Patient not taking: Reported on 10/31/2021)   gabapentin (NEURONTIN) 800 MG tablet Take 800 mg by mouth QID.   hydrOXYzine (VISTARIL) 25 MG capsule Take 25 mg by mouth 3 (three) times daily as needed. One tablet daily   insulin glargine (LANTUS) 100 UNIT/ML injection Inject 30 Units into the skin daily. Take 30 units once daily   losartan (COZAAR) 100 MG tablet Take 100 mg by mouth daily. (Patient not taking: Reported on 06/23/2021)   omeprazole (PRILOSEC) 40 MG capsule Take 40 mg by mouth daily.   OMEPRAZOLE PO Take by mouth. (Patient not taking: Reported on 10/31/2021)    oxyCODONE-acetaminophen (PERCOCET) 10-325 MG tablet Take 1 tablet by mouth every 4 (four) hours as needed for pain. 1 q 6 hours and 1 at bedtime   oxyCODONE-acetaminophen (PERCOCET) 10-325 MG tablet Take 1 tablet by mouth every 4 (four) hours as needed for pain. (Patient not taking: Reported on 05/15/2022)   oxyCODONE-acetaminophen (PERCOCET) 5-325 MG tablet Take 1 tablet by mouth every 4 (four) hours as needed for severe pain. (Patient not taking: Reported on 05/15/2022)   polyethylene glycol (MIRALAX / GLYCOLAX) 17 g packet Take 17 g by mouth daily.   Semaglutide (OZEMPIC, 1 MG/DOSE, [place]) Inject 1 mg into the skin once a week.   sertraline (ZOLOFT) 25 MG tablet Take 25 mg by mouth daily. Per patient is taking 1/2 tablet daily   spironolactone (ALDACTONE) 25 MG tablet Take 25 mg by mouth daily.   Zolpidem Tartrate (AMBIEN PO) Take by mouth.   No current facility-administered medications for this visit. (Other)   REVIEW OF SYSTEMS: ROS   Positive for: Endocrine, Eyes Negative for: Constitutional, Gastrointestinal, Neurological, Skin, Genitourinary, Musculoskeletal, HENT, Cardiovascular, Respiratory, Psychiatric, Allergic/Imm, Heme/Lymph Last edited by Annie Paras, COT on 05/17/2022  7:43 AM.     ALLERGIES Allergies  Allergen Reactions   Lisinopril Other (See Comments)    Other reaction(s): Electrocardiogram abnormal   Sildenafil Nausea And Vomiting   PAST MEDICAL HISTORY Past Medical History:  Diagnosis Date  Arthritis    Diabetes mellitus    Fistula, perirectal    GERD (gastroesophageal reflux disease)    Hypertension    Osteomyelitis of great toe of right foot ([place]) 10/31/2021   Polymicrobial bacterial infection 10/31/2021   PTSD (post-traumatic stress disorder)    PTSD (post-traumatic stress disorder) 10/31/2021   Small bowel obstruction (HCC)    Past Surgical History:  Procedure Laterality Date   RECTAL SURGERY     FAMILY HISTORY History reviewed. No pertinent  family history.  SOCIAL HISTORY Social History   Tobacco Use   Smoking status: Some Days    Types: Cigars   Smokeless tobacco: Never   Tobacco comments:    Occasional cigar  Vaping Use   Vaping Use: Never used  Substance Use Topics   Alcohol use: Not Currently    Comment: occ   Drug use: No       OPHTHALMIC EXAM:  Base Eye Exam     Visual Acuity (Snellen - Linear)       Right Left   Dist cc CF at 3' 20/25 -1   Dist ph cc NI          Tonometry (Tonopen, 7:52 AM)       Right Left   Pressure 12 12         Pupils       Dark Light Shape React APD   Right 2 2 Round NR None   Left 2 2 Round NR None         Visual Fields       Left Right    Full Full         Extraocular Movement       Right Left    Full, Ortho Full, Ortho         Neuro/Psych     Oriented x3: Yes   Mood/Affect: Normal         Dilation     Both eyes: 1.0% Mydriacyl, 2.5% Phenylephrine @ 7:44 AM           Slit Lamp and Fundus Exam     Slit Lamp Exam       Right Left   Lids/Lashes Dermatochalasis - upper lid Dermatochalasis - upper lid   Conjunctiva/Sclera mild melanosis, nasal pingeucula mild melanosis, nasal and temporal pinguecula   Cornea arcus, trace PEE, trace tear film debris arcus, trace PEE, mild tear film debris   Anterior Chamber deep, clear, narrow temporal angle Deep and quiet   Iris Round and dilated, No NVI Round and dilated, No NVI   Lens 3+ Nuclear sclerosis with brunescence, 2-3+ Cortical cataract 2-3+ Nuclear sclerosis with brunescence, 2-3+ Cortical cataract   Anterior Vitreous Vitreous syneresis, mild blood stained vitreous condensations - slightly increased Vitreous syneresis         Fundus Exam       Right Left   Disc Pink and Sharp, no NVD, Compact Pink and Sharp, no NVD   C/D Ratio 0.2 0.2   Macula Flat, good foveal reflex, scattered MA/DBH, CWS SN / IT mac Blunted foveal reflex, central edema with cluster of DBH temporal fovea -  increased, scattered DBH and CWS   Vessels attenuated, Tortuous attenuated, Tortuous, mild Copper wiring   Periphery Attached, scattered DBH and CWS posteriorly Attached, scattered DBH and CWS posteriorly           Refraction     Wearing Rx       Sphere Cylinder Axis Add  Right +1.50 +0.25 090 +2.50   Left +2.50 +0.25 130 +2.50            IMAGING AND PROCEDURES  Imaging and Procedures for 05/17/2022  OCT, Retina - OU - Both Eyes       Right Eye Quality was good. Central Foveal Thickness: 242. Progression has been stable. Findings include normal foveal contour, no IRF, no SRF, outer retinal atrophy, vitreomacular adhesion (Decreased central ellipsoid signal, persistent vitreous opacities -- slightly increased, irregular lamination, mild scattered IRHM).   Left Eye Quality was good. Central Foveal Thickness: 307. Progression has been stable. Findings include no SRF, abnormal foveal contour, intraretinal hyper-reflective material, intraretinal fluid (Increased IRF/IRHM temporal macula ).   Notes *Images captured and stored on drive  Diagnosis / Impression:  OD: Decreased central ellipsoid signal, persistent vitreous opacities -- slightly increased, irregular lamination, mild scattered IRHM OS: Increased IRF/IRHM temporal  macula   Clinical management:  See below  Abbreviations: NFP - Normal foveal profile. CME - cystoid macular edema. PED - pigment epithelial detachment. IRF - intraretinal fluid. SRF - subretinal fluid. EZ - ellipsoid zone. ERM - epiretinal membrane. ORA - outer retinal atrophy. ORT - outer retinal tubulation. SRHM - subretinal hyper-reflective material. IRHM - intraretinal hyper-reflective material      Intravitreal Injection, Pharmacologic Agent - OD - Right Eye       Time Out 05/17/2022. 7:59 AM. Confirmed correct patient, procedure, site, and patient consented.   Anesthesia Topical anesthesia was used. Anesthetic medications included  Lidocaine 2%, Proparacaine 0.5%.   Procedure Preparation included 5% betadine to ocular surface, eyelid speculum. A supplied needle was used.   Injection: 1.25 mg Bevacizumab 1.46m/0.05ml   Route: Intravitreal, Site: Right Eye   NDC: 50242-060-01, Lot:: [id number] Expiration date: 06/25/2022   Post-op Post injection exam found visual acuity of at least counting fingers. The patient tolerated the procedure well. There were no complications. The patient received written and verbal post procedure care education.      Intravitreal Injection, Pharmacologic Agent - OS - Left Eye       Time Out 05/17/2022. 7:59 AM. Confirmed correct patient, procedure, site, and patient consented.   Anesthesia Topical anesthesia was used. Anesthetic medications included Lidocaine 2%, Proparacaine 0.5%.   Procedure Preparation included 5% betadine to ocular surface, eyelid speculum. A (32g) needle was used.   Injection: 1.25 mg Bevacizumab 1.263m0.05ml   Route: Intravitreal, Site: Left Eye   NDC: : 43568-616-83Lot: : [id number] date: 06/17/2022   Post-op Post injection exam found visual acuity of at least counting fingers. The patient tolerated the procedure well. There were no complications. The patient received written and verbal post procedure care education. Post injection medications were not given.            ASSESSMENT/PLAN:    ICD-10-CM   1. 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 Essential  hypertension  I10     4. Hypertensive retinopathy of both eyes  H35.033  5. Retinal ischemia  H35.82     6. Combined forms of age-related cataract of both eyes  H25.813     7. Vitreous hemorrhage of right eye ([place])  H43.11      1-4.  Proliferative diabetic retinopathy OD, Severe non-proliferative diabetic retinopathy, OS  - pt lost to follow up from 06.16.23 to 11.14.23  - pt reports recent improvement in A1c from 11 down to 6 since starting Ozempic - s/p IVA OS #1 (03.22.23), #2 (04.21.23), #3 (05.19.23), #4 (06.16.23) - exam shows scattered MA, DBH and CWS OU, mild VH OD and central cystic changes OS - new VH OD noted on 11.14.23 visit -- schedule PRP in 2 weeks - FA (11.14.23) OD: Enlarged FAZ, scattered patches of vascular non-perfusion greatest temporal periphery, mild perivascular leakage temporal periphery and from MA, SN:KNLZ enlargement of FAZ, scattered patches of vascular non-perfusion greatest temporal periphery, mild perivascular leakage temporal periphery and from MA - OCT shows OD: interval decrease in central ellipsoid signal, persistent vitreous opacities--slightly increased, irregular lamination, mild scattered IRHM; OS: Interval increase in IRF/IRHM temporal macula - recommend IVA OD #1 OD and IVA OS #5 today, 11.15.23 w/ f/u in 4 wks - pt wishes to proceed with injections - RBA of procedure discussed, questions answered - IVA informed consent obtained and signed, 11.16.23 (OU) - see procedure note - f/u 2 weeks, PRP OD  4-6. Hypertensive retinopathy w/ retinal ischemia OU  - BP uncontrolled at initial consult -- 170s / 110-120s in office 12.23.22, today BP is significantly improved to 147/80  - FA 12.23.22 shows significant retinal ischemia OU -- enlarged FAZ OU (OD > OS) and significant patches of capillary drop out OU (OD>OS) - discussed importance of tight BP control and risk of MI and stroke w/ elevated BP  7. Mixed Cataract OU - The symptoms of cataract,  surgical options, and treatments and risks were discussed with patient. - discussed diagnosis and progression - visually significant  - referred to [facility] for cat eval and establishment of primary eye care, but pt has not been there yet  Ophthalmic Meds Ordered this visit:  Meds ordered this encounter  Medications   Bevacizumab (AVASTIN) SOLN 1.25 mg   Bevacizumab (AVASTIN) SOLN 1.25 mg     Return for week of Nov 27, PDR OU, DFE, OCT, Laser PRP OD.  There are no Patient Instructions on file for this visit.   Explained the diagnoses, plan, and follow up with the patient and they expressed understanding.  Patient expressed understanding of the importance of proper follow up care.   This document serves as a record of services personally performed by Gardiner Sleeper, MD, PhD. It was created on their behalf by San Jetty. Owens Shark, OA an ophthalmic technician. The creation of this record is the provider's dictation and/or activities during the visit.    Electronically signed by: San Jetty. Owens Shark, [place] 11.16.2023 9:47 AM   Gardiner Sleeper, M.D., Ph.D. Diseases & Surgery of the Retina and Vitreous [facility]  I have reviewed the above documentation for accuracy and completeness, and I agree with the above. Gardiner Sleeper, M.D., Ph.D. 05/17/22 9:47 AM  Abbreviations: M myopia (nearsighted); A astigmatism; H hyperopia (farsighted); P presbyopia; Mrx spectacle prescription;  CTL contact lenses; OD right eye; OS left eye; OU both eyes  XT exotropia; ET esotropia; PEK punctate epithelial keratitis; PEE punctate epithelial erosions; DES dry eye syndrome; MGD meibomian gland dysfunction; ATs artificial tears; PFAT's  preservative free artificial tears; [place] nuclear sclerotic cataract; PSC posterior subcapsular cataract; ERM epi-retinal membrane; PVD posterior vitreous detachment; RD retinal detachment; DM diabetes mellitus; DR diabetic retinopathy; NPDR non-proliferative  diabetic retinopathy; PDR proliferative diabetic retinopathy; CSME clinically significant macular edema; DME diabetic macular edema; dbh dot blot hemorrhages; CWS cotton wool spot; POAG primary open angle glaucoma; C/D cup-to-disc ratio; HVF humphrey visual field; GVF goldmann visual field; OCT optical coherence tomography; IOP intraocular pressure; BRVO Branch retinal vein occlusion; CRVO central retinal vein occlusion; CRAO central retinal artery occlusion; BRAO branch retinal artery occlusion; RT retinal tear; SB scleral buckle; PPV pars plana vitrectomy; VH Vitreous hemorrhage; PRP panretinal laser photocoagulation; IVK intravitreal kenalog; VMT vitreomacular traction; MH Macular hole;  NVD neovascularization of the disc; NVE neovascularization elsewhere; AREDS age related eye disease study; ARMD age related macular degeneration; POAG primary open angle glaucoma; EBMD epithelial/anterior basement membrane dystrophy; ACIOL anterior chamber intraocular lens; IOL intraocular lens; PCIOL posterior chamber intraocular lens; Phaco/IOL phacoemulsification with intraocular lens placement; [place] photorefractive keratectomy; LASIK laser assisted in situ keratomileusis; HTN hypertension; DM diabetes mellitus; COPD chronic obstructive pulmonary disease

## 2022-05-22 NOTE — Progress Notes (Shared)
[facility] Clinic Note  05/30/2022     CHIEF COMPLAINT Patient presents for No chief complaint on file.   HISTORY OF PRESENT ILLNESS: [name] is a 56 y.o. male who presents to the clinic today for:      Referring physician: Clinic, [facility] [address] [facility] [place],  [place] [address]  HISTORICAL INFORMATION:   Selected notes from the MEDICAL RECORD NUMBER Referred by [facility] for retinal hemes LEE:  Ocular Hx- PMH-    CURRENT MEDICATIONS: No current outpatient medications on file. (Ophthalmic Drugs)   No current facility-administered medications for this visit. (Ophthalmic Drugs)   Current Outpatient Medications (Other)  Medication Sig   AMLODIPINE BESYLATE PO Take 10 mg by mouth daily.   escitalopram (LEXAPRO) 10 MG tablet Take 10 mg by mouth daily. (Patient not taking: Reported on 10/31/2021)   gabapentin (NEURONTIN) 800 MG tablet Take 800 mg by mouth QID.   hydrOXYzine (VISTARIL) 25 MG capsule Take 25 mg by mouth 3 (three) times daily as needed. One tablet daily   insulin glargine (LANTUS) 100 UNIT/ML injection Inject 30 Units into the skin daily. Take 30 units once daily   losartan (COZAAR) 100 MG tablet Take 100 mg by mouth daily. (Patient not taking: Reported on 06/23/2021)   omeprazole (PRILOSEC) 40 MG capsule Take 40 mg by mouth daily.   OMEPRAZOLE PO Take by mouth. (Patient not taking: Reported on 10/31/2021)   oxyCODONE-acetaminophen (PERCOCET) 10-325 MG tablet Take 1 tablet by mouth every 4 (four) hours as needed for pain. 1 q 6 hours and 1 at bedtime   oxyCODONE-acetaminophen (PERCOCET) 10-325 MG tablet Take 1 tablet by mouth every 4 (four) hours as needed for pain. (Patient not taking: Reported on 05/15/2022)   oxyCODONE-acetaminophen (PERCOCET) 5-325 MG tablet Take 1 tablet by mouth every 4 (four) hours as needed for severe pain. (Patient not taking: Reported on 05/15/2022)   polyethylene glycol (MIRALAX / GLYCOLAX)  17 g packet Take 17 g by mouth daily.   Semaglutide (OZEMPIC, 1 MG/DOSE, [place]) Inject 1 mg into the skin once a week.   sertraline (ZOLOFT) 25 MG tablet Take 25 mg by mouth daily. Per patient is taking 1/2 tablet daily   spironolactone (ALDACTONE) 25 MG tablet Take 25 mg by mouth daily.   Zolpidem Tartrate (AMBIEN PO) Take by mouth.   No current facility-administered medications for this visit. (Other)   REVIEW OF SYSTEMS:   ALLERGIES Allergies  Allergen Reactions   Lisinopril Other (See Comments)    Other reaction(s): Electrocardiogram abnormal   Sildenafil Nausea And Vomiting   PAST MEDICAL HISTORY Past Medical History:  Diagnosis Date   Arthritis    Diabetes mellitus    Fistula, perirectal    GERD (gastroesophageal reflux disease)    Hypertension    Osteomyelitis of great toe of right foot (HCC) 10/31/2021   Polymicrobial bacterial infection 10/31/2021   PTSD (post-traumatic stress disorder)    PTSD (post-traumatic stress disorder) 10/31/2021   Small bowel obstruction (HCC)    Past Surgical History:  Procedure Laterality Date   RECTAL SURGERY     FAMILY HISTORY No family history on file.  SOCIAL HISTORY Social History   Tobacco Use   Smoking status: Some Days    Types: Cigars   Smokeless tobacco: Never   Tobacco comments:    Occasional cigar  Vaping Use   Vaping Use: Never used  Substance Use Topics   Alcohol use: Not Currently    Comment:  occ   Drug use: No       OPHTHALMIC EXAM:  Not recorded     IMAGING AND PROCEDURES  Imaging and Procedures for 05/30/2022          ASSESSMENT/PLAN:  No diagnosis found.  1-4.  Proliferative diabetic retinopathy OD, Severe non-proliferative diabetic retinopathy, OS  - pt lost to follow up from 06.16.23 to 11.14.23  - pt reports recent improvement in A1c from 11 down to 6 since starting Ozempic - s/p IVA OS #1 (03.22.23), #2 (04.21.23), #3 (05.19.23), #4 (06.16.23), #5 (11.15.23) - s/p IVA OD #1 (11.15.23) -  exam shows scattered MA, DBH and CWS OU, mild VH OD and central cystic changes OS - new VH OD noted on 11.14.23 visit -- schedule PRP in 2 weeks - FA (11.14.23) OD: Enlarged FAZ, scattered patches of vascular non-perfusion greatest temporal periphery, mild perivascular leakage temporal periphery and from MA, VZ:DGLO enlargement of FAZ, scattered patches of vascular non-perfusion greatest temporal periphery, mild perivascular leakage temporal periphery and from MA - OCT shows OD: interval decrease in central ellipsoid signal, persistent vitreous opacities--slightly increased, irregular lamination, mild scattered IRHM; OS: Interval increase in IRF/IRHM temporal macula - recommend PRP OD today, 11.29.23 - pt wishes to proceed with laser. - RBA of procedure discussed, questions answered - IVA informed consent obtained and signed, 11.16.23 (OU) - see procedure note  - begin Lotemax QID OD x 7 days - f/u 2 weeks, PRP OD  4-6. Hypertensive retinopathy w/ retinal ischemia OU  - BP uncontrolled at initial consult -- 170s / 110-120s in office 12.23.22, today BP is significantly improved to 147/80  - FA 12.23.22 shows significant retinal ischemia OU -- enlarged FAZ OU (OD > OS) and significant patches of capillary drop out OU (OD>OS) - discussed importance of tight BP control and risk of MI and stroke w/ elevated BP  7. Mixed Cataract OU - The symptoms of cataract, surgical options, and treatments and risks were discussed with patient. - discussed diagnosis and progression - visually significant  - referred to [facility] for cat eval and establishment of primary eye care, but pt has not been there yet  Ophthalmic Meds Ordered this visit:  No orders of the defined types were placed in this encounter.    No follow-ups on file.  There are no Patient Instructions on file for this visit.   Explained the diagnoses, plan, and follow up with the patient and they expressed understanding.  Patient  expressed understanding of the importance of proper follow up care.   This document serves as a record of services personally performed by Karie Chimera, MD, PhD. It was created on their behalf by De Blanch, an ophthalmic technician. The creation of this record is the provider's dictation and/or activities during the visit.    Electronically signed by: De Blanch, OA, 05/22/22  12:11 PM    Karie Chimera, M.D., Ph.D. Diseases & Surgery of the Retina and Vitreous [facility]   Abbreviations: M myopia (nearsighted); A astigmatism; H hyperopia (farsighted); P presbyopia; Mrx spectacle prescription;  CTL contact lenses; OD right eye; OS left eye; OU both eyes  XT exotropia; ET esotropia; PEK punctate epithelial keratitis; PEE punctate epithelial erosions; DES dry eye syndrome; MGD meibomian gland dysfunction; ATs artificial tears; PFAT's preservative free artificial tears; NSC nuclear sclerotic cataract; PSC posterior subcapsular cataract; ERM epi-retinal membrane; PVD posterior vitreous detachment; RD retinal detachment; DM diabetes mellitus; DR diabetic retinopathy; NPDR non-proliferative  diabetic retinopathy; PDR proliferative diabetic retinopathy; CSME clinically significant macular edema; DME diabetic macular edema; dbh dot blot hemorrhages; CWS cotton wool spot; POAG primary open angle glaucoma; C/D cup-to-disc ratio; HVF humphrey visual field; GVF goldmann visual field; OCT optical coherence tomography; IOP intraocular pressure; BRVO Branch retinal vein occlusion; CRVO central retinal vein occlusion; CRAO central retinal artery occlusion; BRAO branch retinal artery occlusion; RT retinal tear; SB scleral buckle; PPV pars plana vitrectomy; VH Vitreous hemorrhage; PRP panretinal laser photocoagulation; IVK intravitreal kenalog; VMT vitreomacular traction; MH Macular hole;  NVD neovascularization of the disc; NVE neovascularization elsewhere; AREDS age related eye  disease study; ARMD age related macular degeneration; POAG primary open angle glaucoma; EBMD epithelial/anterior basement membrane dystrophy; ACIOL anterior chamber intraocular lens; IOL intraocular lens; PCIOL posterior chamber intraocular lens; Phaco/IOL phacoemulsification with intraocular lens placement; PRK photorefractive keratectomy; LASIK laser assisted in situ keratomileusis; HTN hypertension; DM diabetes mellitus; COPD chronic obstructive pulmonary disease

## 2022-05-30 ENCOUNTER — Encounter (INDEPENDENT_AMBULATORY_CARE_PROVIDER_SITE_OTHER): Payer: No Typology Code available for payment source | Admitting: Ophthalmology

## 2022-05-30 DIAGNOSIS — E113511 Type 2 diabetes mellitus with proliferative diabetic retinopathy with macular edema, right eye: Secondary | ICD-10-CM

## 2022-05-30 DIAGNOSIS — H3582 Retinal ischemia: Secondary | ICD-10-CM

## 2022-05-30 DIAGNOSIS — H25813 Combined forms of age-related cataract, bilateral: Secondary | ICD-10-CM

## 2022-05-30 DIAGNOSIS — E113412 Type 2 diabetes mellitus with severe nonproliferative diabetic retinopathy with macular edema, left eye: Secondary | ICD-10-CM

## 2022-05-30 DIAGNOSIS — I10 Essential (primary) hypertension: Secondary | ICD-10-CM

## 2022-05-30 DIAGNOSIS — H35033 Hypertensive retinopathy, bilateral: Secondary | ICD-10-CM

## 2022-06-01 ENCOUNTER — Encounter: Payer: Self-pay | Admitting: Physical Medicine & Rehabilitation

## 2022-06-04 ENCOUNTER — Other Ambulatory Visit (HOSPITAL_COMMUNITY): Payer: Self-pay | Admitting: Internal Medicine

## 2022-06-04 DIAGNOSIS — R55 Syncope and collapse: Secondary | ICD-10-CM

## 2022-06-06 ENCOUNTER — Ambulatory Visit (INDEPENDENT_AMBULATORY_CARE_PROVIDER_SITE_OTHER): Payer: No Typology Code available for payment source | Admitting: Podiatry

## 2022-06-06 DIAGNOSIS — M79675 Pain in left toe(s): Secondary | ICD-10-CM | POA: Diagnosis not present

## 2022-06-06 DIAGNOSIS — B351 Tinea unguium: Secondary | ICD-10-CM | POA: Diagnosis not present

## 2022-06-06 DIAGNOSIS — M79674 Pain in right toe(s): Secondary | ICD-10-CM

## 2022-06-06 DIAGNOSIS — E11621 Type 2 diabetes mellitus with foot ulcer: Secondary | ICD-10-CM | POA: Diagnosis not present

## 2022-06-06 NOTE — Progress Notes (Signed)
  Subjective:  Patient ID: [name], male    DOB: 1966-03-09,  MRN: [id number]  Chief Complaint  Patient presents with   Routine Post Op    [facility] DOS 01/08/2022 RT 1ST RAY AMPUTATION PARTIAL Pt WANTS TO GET SET UP WITH NAIL CARE    56 y.o. male returns for the above complaint.  Patient presents with thickened elongated dystrophic toenails x9.  Patient has history of partial first ray amputation denies any other acute complaints he would like to have it debrided is unable to do it himself.  Objective:  There were no vitals filed for this visit. Podiatric Exam: Vascular: dorsalis pedis and posterior tibial pulses are palpable bilateral. Capillary return is immediate. Temperature gradient is WNL. Skin turgor WNL  Sensorium: Normal Semmes Weinstein monofilament test. Normal tactile sensation bilaterally. Nail Exam: Pt has thick disfigured discolored nails with subungual debris noted bilateral entire nail hallux through fifth toenails except right partial first ray amputation.  Pain on palpation to the nails. Ulcer Exam: There is no evidence of ulcer or pre-ulcerative changes or infection. Orthopedic Exam: Muscle tone and strength are WNL. No limitations in general ROM. No crepitus or effusions noted.  Skin: No Porokeratosis. No infection or ulcers    Assessment & Plan:   1. Pain due to onychomycosis of toenails of both feet   2. Type 2 diabetes mellitus with foot ulcer (CODE) (HCC)     Patient was evaluated and treated and all questions answered.  Onychomycosis with pain  -Nails palliatively debrided as below. -Educated on self-care  Procedure: Nail Debridement Rationale: pain  Type of Debridement: manual, sharp debridement. Instrumentation: Nail nipper, rotary burr. Number of Nails: 9  Procedures and Treatment: Consent by patient was obtained for treatment procedures. The patient understood the discussion of treatment and procedures well. All questions were answered  thoroughly reviewed. Debridement of mycotic and hypertrophic toenails, 1 through 5 bilateral and clearing of subungual debris. No ulceration, no infection noted.  Return Visit-Office Procedure: Patient instructed to return to the office for a follow up visit 3 months for continued evaluation and treatment.  Nicholes Rough, DPM    Return in about 3 months (around 09/05/2022).

## 2022-06-06 NOTE — Progress Notes (Shared)
[facility] - Clinic Note  06/13/2022     CHIEF COMPLAINT Patient presents for No chief complaint on file.   HISTORY OF PRESENT ILLNESS: [name] is a 56 y.o. male who presents to the clinic today for:      Referring physician: Clinic, [facility] [address] [facility] [place],  [place] [address]  HISTORICAL INFORMATION:   Selected notes from the MEDICAL RECORD NUMBER Referred by [facility] for retinal hemes LEE:  Ocular Hx- PMH-    CURRENT MEDICATIONS: No current outpatient medications on file. (Ophthalmic Drugs)   No current facility-administered medications for this visit. (Ophthalmic Drugs)   Current Outpatient Medications (Other)  Medication Sig   AMLODIPINE BESYLATE PO Take 10 mg by mouth daily.   escitalopram (LEXAPRO) 10 MG tablet Take 10 mg by mouth daily. (Patient not taking: Reported on 10/31/2021)   gabapentin (NEURONTIN) 800 MG tablet Take 800 mg by mouth QID.   hydrOXYzine (VISTARIL) 25 MG capsule Take 25 mg by mouth 3 (three) times daily as needed. One tablet daily   insulin glargine (LANTUS) 100 UNIT/ML injection Inject 30 Units into the skin daily. Take 30 units once daily   losartan (COZAAR) 100 MG tablet Take 100 mg by mouth daily. (Patient not taking: Reported on 06/23/2021)   omeprazole (PRILOSEC) 40 MG capsule Take 40 mg by mouth daily.   OMEPRAZOLE PO Take by mouth. (Patient not taking: Reported on 10/31/2021)   oxyCODONE-acetaminophen (PERCOCET) 10-325 MG tablet Take 1 tablet by mouth every 4 (four) hours as needed for pain. 1 q 6 hours and 1 at bedtime   oxyCODONE-acetaminophen (PERCOCET) 10-325 MG tablet Take 1 tablet by mouth every 4 (four) hours as needed for pain. (Patient not taking: Reported on 05/15/2022)   oxyCODONE-acetaminophen (PERCOCET) 5-325 MG tablet Take 1 tablet by mouth every 4 (four) hours as needed for severe pain. (Patient not taking: Reported on 05/15/2022)   polyethylene glycol (MIRALAX / GLYCOLAX)  17 g packet Take 17 g by mouth daily.   Semaglutide (OZEMPIC, 1 MG/DOSE, [place]) Inject 1 mg into the skin once a week.   sertraline (ZOLOFT) 25 MG tablet Take 25 mg by mouth daily. Per patient is taking 1/2 tablet daily   spironolactone (ALDACTONE) 25 MG tablet Take 25 mg by mouth daily.   Zolpidem Tartrate (AMBIEN PO) Take by mouth.   No current facility-administered medications for this visit. (Other)   REVIEW OF SYSTEMS:   ALLERGIES Allergies  Allergen Reactions   Lisinopril Other (See Comments)    Other reaction(s): Electrocardiogram abnormal   Sildenafil Nausea And Vomiting   PAST MEDICAL HISTORY Past Medical History:  Diagnosis Date   Arthritis    Diabetes mellitus    Fistula, perirectal    GERD (gastroesophageal reflux disease)    Hypertension    Osteomyelitis of great toe of right foot (HCC) 10/31/2021   Polymicrobial bacterial infection 10/31/2021   PTSD (post-traumatic stress disorder)    PTSD (post-traumatic stress disorder) 10/31/2021   Small bowel obstruction (HCC)    Past Surgical History:  Procedure Laterality Date   RECTAL SURGERY     FAMILY HISTORY No family history on file.  SOCIAL HISTORY Social History   Tobacco Use   Smoking status: Some Days    Types: Cigars   Smokeless tobacco: Never   Tobacco comments:    Occasional cigar  Vaping Use   Vaping Use: Never used  Substance Use Topics   Alcohol use: Not Currently    Comment:  occ   Drug use: No       OPHTHALMIC EXAM:  Not recorded     IMAGING AND PROCEDURES  Imaging and Procedures for 06/13/2022          ASSESSMENT/PLAN:  No diagnosis found.  1-4.  Proliferative diabetic retinopathy OD, Severe non-proliferative diabetic retinopathy, OS  - pt lost to follow up from 06.16.23 to 11.14.23  - pt reports recent improvement in A1c from 11 down to 6 since starting Ozempic - s/p IVA OS #1 (03.22.23), #2 (04.21.23), #3 (05.19.23), #4 (06.16.23), #5 (11.15.23) - s/p IVA OD #1 (11.15.23) -  exam shows scattered MA, DBH and CWS OU, mild VH OD and central cystic changes OS - new VH OD noted on 11.14.23 visit -- schedule PRP in 2 weeks - FA (11.14.23) OD: Enlarged FAZ, scattered patches of vascular non-perfusion greatest temporal periphery, mild perivascular leakage temporal periphery and from MA, ZO:XWRU enlargement of FAZ, scattered patches of vascular non-perfusion greatest temporal periphery, mild perivascular leakage temporal periphery and from MA - OCT shows OD: interval decrease in central ellipsoid signal, persistent vitreous opacities--slightly increased, irregular lamination, mild scattered IRHM; OS: Interval increase in IRF/IRHM temporal macula - recommend IVA OD #2 OD and IVA OS #6 today, 12.13.23 w/ f/u in 4 wks - pt wishes to proceed with injections - RBA of procedure discussed, questions answered - IVA informed consent obtained and signed, 11.16.23 (OU) - see procedure note  - f/u 2 weeks, PRP OD  4-6. Hypertensive retinopathy w/ retinal ischemia OU  - BP uncontrolled at initial consult -- 170s / 110-120s in office 12.23.22, today BP is significantly improved to 147/80  - FA 12.23.22 shows significant retinal ischemia OU -- enlarged FAZ OU (OD > OS) and significant patches of capillary drop out OU (OD>OS) - discussed importance of tight BP control and risk of MI and stroke w/ elevated BP  7. Mixed Cataract OU - The symptoms of cataract, surgical options, and treatments and risks were discussed with patient. - discussed diagnosis and progression - visually significant  - referred to [facility] for cat eval and establishment of primary eye care, but pt has not been there yet  Ophthalmic Meds Ordered this visit:  No orders of the defined types were placed in this encounter.    No follow-ups on file.  There are no Patient Instructions on file for this visit.   Explained the diagnoses, plan, and follow up with the patient and they expressed understanding.   Patient expressed understanding of the importance of proper follow up care.   This document serves as a record of services personally performed by Karie Chimera, MD, PhD. It was created on their behalf by De Blanch, an ophthalmic technician. The creation of this record is the provider's dictation and/or activities during the visit.    Electronically signed by: De Blanch, OA, 06/06/22  11:06 AM    Karie Chimera, M.D., Ph.D. Diseases & Surgery of the Retina and Vitreous [facility]    Abbreviations: M myopia (nearsighted); A astigmatism; H hyperopia (farsighted); P presbyopia; Mrx spectacle prescription;  CTL contact lenses; OD right eye; OS left eye; OU both eyes  XT exotropia; ET esotropia; PEK punctate epithelial keratitis; PEE punctate epithelial erosions; DES dry eye syndrome; MGD meibomian gland dysfunction; ATs artificial tears; PFAT's preservative free artificial tears; NSC nuclear sclerotic cataract; PSC posterior subcapsular cataract; ERM epi-retinal membrane; PVD posterior vitreous detachment; RD retinal detachment; DM diabetes mellitus; DR  diabetic retinopathy; NPDR non-proliferative diabetic retinopathy; PDR proliferative diabetic retinopathy; CSME clinically significant macular edema; DME diabetic macular edema; dbh dot blot hemorrhages; CWS cotton wool spot; POAG primary open angle glaucoma; C/D cup-to-disc ratio; HVF humphrey visual field; GVF goldmann visual field; OCT optical coherence tomography; IOP intraocular pressure; BRVO Branch retinal vein occlusion; CRVO central retinal vein occlusion; CRAO central retinal artery occlusion; BRAO branch retinal artery occlusion; RT retinal tear; SB scleral buckle; PPV pars plana vitrectomy; VH Vitreous hemorrhage; PRP panretinal laser photocoagulation; IVK intravitreal kenalog; VMT vitreomacular traction; MH Macular hole;  NVD neovascularization of the disc; NVE neovascularization elsewhere; AREDS age  related eye disease study; ARMD age related macular degeneration; POAG primary open angle glaucoma; EBMD epithelial/anterior basement membrane dystrophy; ACIOL anterior chamber intraocular lens; IOL intraocular lens; PCIOL posterior chamber intraocular lens; Phaco/IOL phacoemulsification with intraocular lens placement; PRK photorefractive keratectomy; LASIK laser assisted in situ keratomileusis; HTN hypertension; DM diabetes mellitus; COPD chronic obstructive pulmonary disease

## 2022-06-07 ENCOUNTER — Ambulatory Visit (HOSPITAL_COMMUNITY): Admission: RE | Admit: 2022-06-07 | Payer: No Typology Code available for payment source | Source: Ambulatory Visit

## 2022-06-13 ENCOUNTER — Encounter (INDEPENDENT_AMBULATORY_CARE_PROVIDER_SITE_OTHER): Payer: No Typology Code available for payment source | Admitting: Ophthalmology

## 2022-06-13 DIAGNOSIS — H35033 Hypertensive retinopathy, bilateral: Secondary | ICD-10-CM

## 2022-06-13 DIAGNOSIS — E113412 Type 2 diabetes mellitus with severe nonproliferative diabetic retinopathy with macular edema, left eye: Secondary | ICD-10-CM

## 2022-06-13 DIAGNOSIS — I10 Essential (primary) hypertension: Secondary | ICD-10-CM

## 2022-06-13 DIAGNOSIS — E113511 Type 2 diabetes mellitus with proliferative diabetic retinopathy with macular edema, right eye: Secondary | ICD-10-CM

## 2022-06-13 DIAGNOSIS — H3582 Retinal ischemia: Secondary | ICD-10-CM

## 2022-06-14 ENCOUNTER — Ambulatory Visit (HOSPITAL_COMMUNITY)
Admission: RE | Admit: 2022-06-14 | Discharge: 2022-06-14 | Disposition: A | Discharge status: Home / Self Care | Payer: No Typology Code available for payment source | Source: Ambulatory Visit | Attending: Internal Medicine | Admitting: Internal Medicine

## 2022-06-14 DIAGNOSIS — R55 Syncope and collapse: Secondary | ICD-10-CM | POA: Diagnosis present

## 2022-06-14 MED ORDER — SODIUM CHLORIDE (PF) 0.9 % IJ SOLN
INTRAMUSCULAR | Status: AC
  Filled 2022-06-14: qty 50

## 2022-06-14 MED ORDER — IOHEXOL 350 MG/ML SOLN
75.0000 mL | Freq: Once | INTRAVENOUS | Status: AC | PRN
  Administered 2022-06-14: 75 mL via INTRAVENOUS

## 2022-06-14 NOTE — Progress Notes (Shared)
[facility] - Clinic Note  06/19/2022     CHIEF COMPLAINT Patient presents for No chief complaint on file.   HISTORY OF PRESENT ILLNESS: [name] is a 56 y.o. male who presents to the clinic today for:    Patient is here for IVA OU today   Referring physician: Clinic, [facility] Sink [address] [facility] [place],  [place] [address]  HISTORICAL INFORMATION:   Selected notes from the MEDICAL RECORD NUMBER Referred by [facility] for retinal hemes LEE:  Ocular Hx- PMH-    CURRENT MEDICATIONS: No current outpatient medications on file. (Ophthalmic Drugs)   No current facility-administered medications for this visit. (Ophthalmic Drugs)   Current Outpatient Medications (Other)  Medication Sig   AMLODIPINE BESYLATE PO Take 10 mg by mouth daily.   escitalopram (LEXAPRO) 10 MG tablet Take 10 mg by mouth daily. (Patient not taking: Reported on 10/31/2021)   gabapentin (NEURONTIN) 800 MG tablet Take 800 mg by mouth QID.   hydrOXYzine (VISTARIL) 25 MG capsule Take 25 mg by mouth 3 (three) times daily as needed. One tablet daily   insulin glargine (LANTUS) 100 UNIT/ML injection Inject 30 Units into the skin daily. Take 30 units once daily   losartan (COZAAR) 100 MG tablet Take 100 mg by mouth daily. (Patient not taking: Reported on 06/23/2021)   omeprazole (PRILOSEC) 40 MG capsule Take 40 mg by mouth daily.   OMEPRAZOLE PO Take by mouth. (Patient not taking: Reported on 10/31/2021)   oxyCODONE-acetaminophen (PERCOCET) 10-325 MG tablet Take 1 tablet by mouth every 4 (four) hours as needed for pain. 1 q 6 hours and 1 at bedtime   oxyCODONE-acetaminophen (PERCOCET) 10-325 MG tablet Take 1 tablet by mouth every 4 (four) hours as needed for pain. (Patient not taking: Reported on 05/15/2022)   oxyCODONE-acetaminophen (PERCOCET) 5-325 MG tablet Take 1 tablet by mouth every 4 (four) hours as needed for severe pain. (Patient not taking: Reported on 05/15/2022)    polyethylene glycol (MIRALAX / GLYCOLAX) 17 g packet Take 17 g by mouth daily.   Semaglutide (OZEMPIC, 1 MG/DOSE, [place]) Inject 1 mg into the skin once a week.   sertraline (ZOLOFT) 25 MG tablet Take 25 mg by mouth daily. Per patient is taking 1/2 tablet daily   spironolactone (ALDACTONE) 25 MG tablet Take 25 mg by mouth daily.   Zolpidem Tartrate (AMBIEN PO) Take by mouth.   No current facility-administered medications for this visit. (Other)   Facility-Administered Medications Ordered in Other Visits (Other)  Medication Route   sodium chloride (PF) 0.9 % injection    REVIEW OF SYSTEMS:   ALLERGIES Allergies  Allergen Reactions   Lisinopril Other (See Comments)    Other reaction(s): Electrocardiogram abnormal   Sildenafil Nausea And Vomiting   PAST MEDICAL HISTORY Past Medical History:  Diagnosis Date   Arthritis    Diabetes mellitus    Fistula, perirectal    GERD (gastroesophageal reflux disease)    Hypertension    Osteomyelitis of great toe of right foot (HCC) 10/31/2021   Polymicrobial bacterial infection 10/31/2021   PTSD (post-traumatic stress disorder)    PTSD (post-traumatic stress disorder) 10/31/2021   Small bowel obstruction (HCC)    Past Surgical History:  Procedure Laterality Date   RECTAL SURGERY     FAMILY HISTORY No family history on file.  SOCIAL HISTORY Social History   Tobacco Use   Smoking status: Some Days    Types: Cigars   Smokeless tobacco: Never   Tobacco comments:  Occasional cigar  Vaping Use   Vaping Use: Never used  Substance Use Topics   Alcohol use: Not Currently    Comment: occ   Drug use: No       OPHTHALMIC EXAM:  Not recorded     IMAGING AND PROCEDURES  Imaging and Procedures for 06/19/2022          ASSESSMENT/PLAN:    ICD-10-CM   1. 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 Severe nonproliferative diabetic retinopathy of  left eye with macular edema associated with type 2 diabetes mellitus (HCC)  P29.5188     3. Essential hypertension  I10     4. Hypertensive retinopathy of both eyes  H35.033     5. Retinal ischemia  H35.82     6. Combined forms of age-related cataract of both eyes  H25.813     7. Vitreous hemorrhage of right eye (HCC)  H43.11       1-4.  Proliferative diabetic retinopathy OD, Severe non-proliferative diabetic retinopathy, OS  - pt lost to follow up from 06.16.23 to 11.14.23  - pt reports recent improvement in A1c from 11 down to 6 since starting Ozempic  - d/p IVA OD #1 (11.15.23) - s/p IVA OS #1 (03.22.23), #2 (04.21.23), #3 (05.19.23), #4 (06.16.23), #5 (11.15.23) - exam shows scattered MA, DBH and CWS OU, mild VH OD and central cystic changes OS - new VH OD noted on 11.14.23 visit -- schedule PRP in 2 weeks - FA (11.14.23) OD: Enlarged FAZ, scattered patches of vascular non-perfusion greatest temporal periphery, mild perivascular leakage temporal periphery and from MA, CZ:YSAY enlargement of FAZ, scattered patches of vascular non-perfusion greatest temporal periphery, mild perivascular leakage temporal periphery and from MA - OCT shows OD: interval decrease in central ellipsoid signal, persistent vitreous opacities--slightly increased, irregular lamination, mild scattered IRHM; OS: Interval increase in IRF/IRHM temporal macula - recommend IVA OD #2 OD and IVA OS #6 today, 12.19.23 w/ f/u in 4 wks - pt wishes to proceed with injections - RBA of procedure discussed, questions answered - IVA informed consent obtained and signed, 11.16.23 (OU) - see procedure note - f/u 2 weeks, PRP OD  4-6. Hypertensive retinopathy w/ retinal ischemia OU  - BP uncontrolled at initial consult -- 170s / 110-120s in office 12.23.22, today BP is significantly improved to 147/80  - FA 12.23.22 shows significant retinal ischemia OU -- enlarged FAZ OU (OD > OS) and significant patches of capillary drop out  OU (OD>OS) - discussed importance of tight BP control and risk of MI and stroke w/ elevated BP  7. Mixed Cataract OU - The symptoms of cataract, surgical options, and treatments and risks were discussed with patient. - discussed diagnosis and progression - visually significant  - referred to [facility] for cat eval and establishment of primary eye care, but pt has not been there yet  Ophthalmic Meds Ordered this visit:  No orders of the defined types were placed in this encounter.    No follow-ups on file.  There are no Patient Instructions on file for this visit.   Explained the diagnoses, plan, and follow up with the patient and they expressed understanding.  Patient expressed understanding of the importance of proper follow up care.   This document serves as a record of services personally performed by Karie Chimera, MD, PhD. It was created on their behalf by Gerilyn Nestle,  COT an ophthalmic technician. The creation of this record is the provider's dictation and/or activities during the visit.    Electronically signed by:  Gerilyn Nestle, COT  14.14.23 9:44 AM    Karie Chimera, M.D., Ph.D. Diseases & Surgery of the Retina and Vitreous [facility]    Abbreviations: M myopia (nearsighted); A astigmatism; H hyperopia (farsighted); P presbyopia; Mrx spectacle prescription;  CTL contact lenses; OD right eye; OS left eye; OU both eyes  XT exotropia; ET esotropia; PEK punctate epithelial keratitis; PEE punctate epithelial erosions; DES dry eye syndrome; MGD meibomian gland dysfunction; ATs artificial tears; PFAT's preservative free artificial tears; NSC nuclear sclerotic cataract; PSC posterior subcapsular cataract; ERM epi-retinal membrane; PVD posterior vitreous detachment; RD retinal detachment; DM diabetes mellitus; DR diabetic retinopathy; NPDR non-proliferative diabetic retinopathy; PDR proliferative diabetic retinopathy; CSME clinically  significant macular edema; DME diabetic macular edema; dbh dot blot hemorrhages; CWS cotton wool spot; POAG primary open angle glaucoma; C/D cup-to-disc ratio; HVF humphrey visual field; GVF goldmann visual field; OCT optical coherence tomography; IOP intraocular pressure; BRVO Branch retinal vein occlusion; CRVO central retinal vein occlusion; CRAO central retinal artery occlusion; BRAO branch retinal artery occlusion; RT retinal tear; SB scleral buckle; PPV pars plana vitrectomy; VH Vitreous hemorrhage; PRP panretinal laser photocoagulation; IVK intravitreal kenalog; VMT vitreomacular traction; MH Macular hole;  NVD neovascularization of the disc; NVE neovascularization elsewhere; AREDS age related eye disease study; ARMD age related macular degeneration; POAG primary open angle glaucoma; EBMD epithelial/anterior basement membrane dystrophy; ACIOL anterior chamber intraocular lens; IOL intraocular lens; PCIOL posterior chamber intraocular lens; Phaco/IOL phacoemulsification with intraocular lens placement; PRK photorefractive keratectomy; LASIK laser assisted in situ keratomileusis; HTN hypertension; DM diabetes mellitus; COPD chronic obstructive pulmonary disease

## 2022-06-19 ENCOUNTER — Encounter (INDEPENDENT_AMBULATORY_CARE_PROVIDER_SITE_OTHER): Payer: No Typology Code available for payment source | Admitting: Ophthalmology

## 2022-06-19 DIAGNOSIS — H4311 Vitreous hemorrhage, right eye: Secondary | ICD-10-CM

## 2022-06-19 DIAGNOSIS — H35033 Hypertensive retinopathy, bilateral: Secondary | ICD-10-CM

## 2022-06-19 DIAGNOSIS — E113511 Type 2 diabetes mellitus with proliferative diabetic retinopathy with macular edema, right eye: Secondary | ICD-10-CM

## 2022-06-19 DIAGNOSIS — H25813 Combined forms of age-related cataract, bilateral: Secondary | ICD-10-CM

## 2022-06-19 DIAGNOSIS — E113412 Type 2 diabetes mellitus with severe nonproliferative diabetic retinopathy with macular edema, left eye: Secondary | ICD-10-CM

## 2022-06-19 DIAGNOSIS — H3582 Retinal ischemia: Secondary | ICD-10-CM

## 2022-06-19 DIAGNOSIS — I10 Essential (primary) hypertension: Secondary | ICD-10-CM

## 2022-06-21 NOTE — Progress Notes (Signed)
[facility] Note  06/22/2022     CHIEF COMPLAINT Patient presents for Retina Follow Up   HISTORY OF PRESENT ILLNESS: [name] is a 56 y.o. male who presents to the clinic today for:   HPI     Retina Follow Up   Patient presents with  Diabetic Retinopathy.  In both eyes.  This started 4 weeks ago.  Severity is moderate.  Duration of 4 weeks.  Since onset it is stable.  I, the attending physician,  performed the HPI with the patient and updated documentation appropriately.        Comments   5 week PDR and IVA pt is reporting little blurred vision he is reporting better glycemic control he is not currently checking his blood sugar at this time but his last A1C 10 one before was between 13/14      Last edited by Bernarda Caffey, MD on 06/23/2022  2:31 AM.      Referring physician: Clinic, [facility] [address] [place],  [place] [address]  HISTORICAL INFORMATION:   Selected notes from the MEDICAL RECORD NUMBER Referred by [facility] for retinal hemes LEE:  Ocular Hx- PMH-    CURRENT MEDICATIONS: No current outpatient medications on file. (Ophthalmic Drugs)   No current facility-administered medications for this visit. (Ophthalmic Drugs)   Current Outpatient Medications (Other)  Medication Sig   AMLODIPINE BESYLATE PO Take 10 mg by mouth daily.   escitalopram (LEXAPRO) 10 MG tablet Take 10 mg by mouth daily. (Patient not taking: Reported on 10/31/2021)   gabapentin (NEURONTIN) 800 MG tablet Take 800 mg by mouth QID.   hydrOXYzine (VISTARIL) 25 MG capsule Take 25 mg by mouth 3 (three) times daily as needed. One tablet daily   insulin glargine (LANTUS) 100 UNIT/ML injection Inject 30 Units into the skin daily. Take 30 units once daily   losartan (COZAAR) 100 MG tablet Take 100 mg by mouth daily. (Patient not taking: Reported on 06/23/2021)   omeprazole (PRILOSEC) 40 MG capsule Take 40 mg by mouth daily.   OMEPRAZOLE PO  Take by mouth. (Patient not taking: Reported on 10/31/2021)   oxyCODONE-acetaminophen (PERCOCET) 10-325 MG tablet Take 1 tablet by mouth every 4 (four) hours as needed for pain. 1 q 6 hours and 1 at bedtime   oxyCODONE-acetaminophen (PERCOCET) 10-325 MG tablet Take 1 tablet by mouth every 4 (four) hours as needed for pain. (Patient not taking: Reported on 05/15/2022)   oxyCODONE-acetaminophen (PERCOCET) 5-325 MG tablet Take 1 tablet by mouth every 4 (four) hours as needed for severe pain. (Patient not taking: Reported on 05/15/2022)   polyethylene glycol (MIRALAX / GLYCOLAX) 17 g packet Take 17 g by mouth daily.   Semaglutide (OZEMPIC, 1 MG/DOSE, [place]) Inject 1 mg into the skin once a week.   sertraline (ZOLOFT) 25 MG tablet Take 25 mg by mouth daily. Per patient is taking 1/2 tablet daily   spironolactone (ALDACTONE) 25 MG tablet Take 25 mg by mouth daily.   Zolpidem Tartrate (AMBIEN PO) Take by mouth.   No current facility-administered medications for this visit. (Other)   REVIEW OF SYSTEMS: ROS   Positive for: Endocrine, Eyes Negative for: Constitutional, Gastrointestinal, Neurological, Skin, Genitourinary, Musculoskeletal, HENT, Cardiovascular, Respiratory, Psychiatric, Allergic/Imm, Heme/Lymph Last edited by Bernarda Caffey, MD on 06/22/2022  1:26 PM.      ALLERGIES Allergies  Allergen Reactions   Lisinopril Other (See Comments)    Other reaction(s): Electrocardiogram abnormal   Sildenafil Nausea And  Vomiting   PAST MEDICAL HISTORY Past Medical History:  Diagnosis Date   Arthritis    Diabetes mellitus    Fistula, perirectal    GERD (gastroesophageal reflux disease)    Hypertension    Osteomyelitis of great toe of right foot ([place]) 10/31/2021   Polymicrobial bacterial infection 10/31/2021   PTSD (post-traumatic stress disorder)    PTSD (post-traumatic stress disorder) 10/31/2021   Small bowel obstruction (HCC)    Past Surgical History:  Procedure Laterality Date   RECTAL SURGERY      FAMILY HISTORY History reviewed. No pertinent family history.  SOCIAL HISTORY Social History   Tobacco Use   Smoking status: Some Days    Types: Cigars   Smokeless tobacco: Never   Tobacco comments:    Occasional cigar  Vaping Use   Vaping Use: Never used  Substance Use Topics   Alcohol use: Not Currently    Comment: occ   Drug use: No       OPHTHALMIC EXAM:  Base Eye Exam     Visual Acuity (Snellen - Linear)       Right Left   Dist cc CF at 3' 20/20 -2   Dist ph  NI          Tonometry (Tonopen, 8:50 AM)       Right Left   Pressure 12 13         Pupils       Pupils Dark Light Shape React APD   Right PERRL 2 2 Round Brisk None   Left PERRL 2 2 Round Brisk None         Visual Fields       Left Right    Full Full         Extraocular Movement       Right Left    Full, Ortho Full, Ortho         Neuro/Psych     Oriented x3: Yes   Mood/Affect: Normal         Dilation     Both eyes: 2.5% Phenylephrine @ 8:49 AM           Slit Lamp and Fundus Exam     Slit Lamp Exam       Right Left   Lids/Lashes Dermatochalasis - upper lid Dermatochalasis - upper lid   Conjunctiva/Sclera mild melanosis, nasal pingeucula mild melanosis, nasal and temporal pinguecula   Cornea arcus, trace PEE, trace tear film debris arcus, trace PEE, mild tear film debris   Anterior Chamber deep, clear, narrow temporal angle Deep and quiet   Iris Round and dilated, No NVI Round and dilated, No NVI   Lens 3+ Nuclear sclerosis with brunescence, 2-3+ Cortical cataract 2-3+ Nuclear sclerosis with brunescence, 2-3+ Cortical cataract   Anterior Vitreous Vitreous syneresis, mild blood stained vitreous condensations - slightly improved Vitreous syneresis         Fundus Exam       Right Left   Disc Pink and Sharp, no NVD, Compact Pink and Sharp, no NVD   C/D Ratio 0.2 0.2   Macula Flat, good foveal reflex, scattered MA/DBH, CWS SN / IT mac Blunted foveal  reflex, central edema with cluster of DBH temporal fovea - improved, scattered DBH and CWS   Vessels attenuated, Tortuous attenuated, Tortuous, mild Copper wiring   Periphery Attached, scattered DBH and CWS posteriorly Attached, scattered DBH and CWS posteriorly           Refraction  Wearing Rx       Sphere Cylinder Axis Add   Right +1.50 +0.25 090 +2.50   Left +2.50 +0.25 130 +2.50            IMAGING AND PROCEDURES  Imaging and Procedures for 06/22/2022  OCT, Retina - OU - Both Eyes       Right Eye Quality was good. Central Foveal Thickness: 231. Progression has improved. Findings include normal foveal contour, no IRF, no SRF, outer retinal atrophy, vitreomacular adhesion (Decreased central ellipsoid signal; persistent vitreous opacities -- slightly improved, especially nasal to disc; irregular lamination, mild scattered IRHM).   Left Eye Quality was good. Central Foveal Thickness: 282. Progression has improved. Findings include no SRF, abnormal foveal contour, intraretinal hyper-reflective material, intraretinal fluid (Interval improvement in cystic changes IRF/IRHM temporal macula ).   Notes *Images captured and stored on drive  Diagnosis / Impression:  OD: Decreased central ellipsoid signal; persistent vitreous opacities -- slightly improved, especially nasal to disc; irregular lamination, mild scattered IRHM OS: Interval improvement in cystic changes IRF/IRHM temporal  macula   Clinical management:  See below  Abbreviations: NFP - Normal foveal profile. CME - cystoid macular edema. PED - pigment epithelial detachment. IRF - intraretinal fluid. SRF - subretinal fluid. EZ - ellipsoid zone. ERM - epiretinal membrane. ORA - outer retinal atrophy. ORT - outer retinal tubulation. SRHM - subretinal hyper-reflective material. IRHM - intraretinal hyper-reflective material      Intravitreal Injection, Pharmacologic Agent - OD - Right Eye       Time Out 06/22/2022.  10:00 AM. Confirmed correct patient, procedure, site, and patient consented.   Anesthesia Topical anesthesia was used. Anesthetic medications included Lidocaine 2%, Proparacaine 0.5%.   Procedure Preparation included 5% betadine to ocular surface, eyelid speculum. A supplied needle was used.   Injection: 1.25 mg Bevacizumab 1.68m/0.05ml   Route: Intravitreal, Site: Right Eye   NDC: 50242-060-01, Lot:: [id number] Expiration date: 07/09/2022   Post-op Post injection exam found visual acuity of at least counting fingers. The patient tolerated the procedure well. There were no complications. The patient received written and verbal post procedure care education.      Intravitreal Injection, Pharmacologic Agent - OS - Left Eye       Time Out 06/22/2022. 10:00 AM. Confirmed correct patient, procedure, site, and patient consented.   Anesthesia Topical anesthesia was used. Anesthetic medications included Lidocaine 2%, Proparacaine 0.5%.   Procedure Preparation included 5% betadine to ocular surface, eyelid speculum. A supplied (32g) needle was used.   Injection: 1.25 mg Bevacizumab 1.269m0.05ml   Route: Intravitreal, Site: Left Eye   NDC: : 30865-784-69Lot: : [id number] date: 08/01/2022   Post-op Post injection exam found visual acuity of at least counting fingers. The patient tolerated the procedure well. There were no complications. The patient received written and verbal post procedure care education. Post injection medications were not given.            ASSESSMENT/PLAN:    ICD-10-CM   1. 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 Essential hypertension  I10     4. Hypertensive retinopathy of both eyes  H35.033     5. Retinal ischemia  H35.82     6. Combined forms of age-related cataract of both eyes  H25.813     7. Vitreous hemorrhage of right eye ([place])  H43.11      1-4.  Proliferative diabetic retinopathy OD, Severe non-proliferative diabetic retinopathy, OS  - pt lost to follow up from 06.16.23 to 11.14.23  - pt reports recent improvement in A1c from 11 down to 6 since starting Ozempic - s/p IVA OS #1 (03.22.23), #2 (04.21.23), #3 (05.19.23), #4 (06.16.23), #5 (11.15.23) - s/p IVA OD #1 (11.15.23) - exam shows scattered MA, DBH and CWS OU, mild VH OD and central cystic changes OS - new VH OD noted on 11.14.23 visit -- schedule PRP once VH cleared - FA (11.14.23) OD: Enlarged FAZ, scattered patches of vascular non-perfusion greatest temporal periphery, mild perivascular leakage temporal periphery and from MA, YQ:MGNO enlargement of FAZ, scattered patches of vascular non-perfusion greatest temporal periphery, mild perivascular leakage temporal periphery and from MA - OCT shows OD: Decreased central ellipsoid signal, persistent vitreous opacities -- slightly increased, irregular lamination, mild scattered IRHM; OS: Interval improvement in cystic changes IRF/IRHM temporal  macula at 5 weeks - recommend IVA OD #2 and IVA OS #6 today, 12.22.23 w/ f/u in 5 wks - pt wishes to proceed with injections - RBA of procedure discussed, questions answered - IVA informed consent obtained and signed, 11.16.23 (OU) - see procedure note - f/u 5 weeks, PRP OD  4-6. Hypertensive retinopathy w/ retinal ischemia OU  - BP uncontrolled at initial consult -- 170s / 110-120s in office 12.23.22, today BP is significantly improved to 147/80  - FA 12.23.22 shows significant retinal ischemia OU -- enlarged FAZ OU (OD > OS) and  significant patches of capillary drop out OU (OD>OS)  7. Mixed Cataract OU - The symptoms of cataract, surgical options, and treatments and risks were discussed with patient. - discussed diagnosis and progression - visually significant  - referred to [facility] for cat eval and establishment of primary eye care, but pt has not been there yet  Ophthalmic Meds Ordered this visit:  Meds ordered this encounter  Medications   Bevacizumab (AVASTIN) SOLN 1.25 mg   Bevacizumab (AVASTIN) SOLN 1.25 mg     Return in about 5 weeks (around 07/27/2022) for f/u PDR OU, DFE, OCT.  There are no Patient Instructions on file for this visit.   Explained the diagnoses, plan, and follow up with the patient and they expressed understanding.  Patient expressed understanding of the importance of proper follow up care.   This document serves as a record of services personally performed by Gardiner Sleeper, MD, PhD. It was created on their behalf by San Jetty. Owens Shark, OA an ophthalmic technician. The creation of this record is the provider's dictation and/or activities during the visit.    Electronically signed by: San Jetty. Owens Shark, [place] 12.21.2023 2:32 AM  Gardiner Sleeper, M.D., Ph.D. Diseases & Surgery of the Retina and Vitreous [facility]  I have reviewed the above documentation for accuracy and completeness, and I agree with the above. Gardiner Sleeper, M.D., Ph.D. 06/23/22 2:33 AM   Abbreviations: M myopia (nearsighted); A astigmatism; H hyperopia (farsighted); P presbyopia; Mrx spectacle prescription;  CTL contact lenses; OD right eye; OS left eye; OU both eyes  XT exotropia; ET esotropia; PEK punctate  epithelial keratitis; PEE punctate epithelial erosions; DES dry eye syndrome; MGD meibomian gland dysfunction; ATs artificial tears; PFAT's preservative free artificial tears; [place] nuclear sclerotic cataract; PSC posterior subcapsular cataract; ERM epi-retinal membrane; PVD posterior  vitreous detachment; RD retinal detachment; DM diabetes mellitus; DR diabetic retinopathy; NPDR non-proliferative diabetic retinopathy; PDR proliferative diabetic retinopathy; CSME clinically significant macular edema; DME diabetic macular edema; dbh dot blot hemorrhages; CWS cotton wool spot; POAG primary open angle glaucoma; C/D cup-to-disc ratio; HVF humphrey visual field; GVF goldmann visual field; OCT optical coherence tomography; IOP intraocular pressure; BRVO Branch retinal vein occlusion; CRVO central retinal vein occlusion; CRAO central retinal artery occlusion; BRAO branch retinal artery occlusion; RT retinal tear; SB scleral buckle; PPV pars plana vitrectomy; VH Vitreous hemorrhage; PRP panretinal laser photocoagulation; IVK intravitreal kenalog; VMT vitreomacular traction; MH Macular hole;  NVD neovascularization of the disc; NVE neovascularization elsewhere; AREDS age related eye disease study; ARMD age related macular degeneration; POAG primary open angle glaucoma; EBMD epithelial/anterior basement membrane dystrophy; ACIOL anterior chamber intraocular lens; IOL intraocular lens; PCIOL posterior chamber intraocular lens; Phaco/IOL phacoemulsification with intraocular lens placement; [place] photorefractive keratectomy; LASIK laser assisted in situ keratomileusis; HTN hypertension; DM diabetes mellitus; COPD chronic obstructive pulmonary disease

## 2022-06-22 ENCOUNTER — Ambulatory Visit (INDEPENDENT_AMBULATORY_CARE_PROVIDER_SITE_OTHER): Payer: No Typology Code available for payment source | Admitting: Ophthalmology

## 2022-06-22 ENCOUNTER — Encounter (INDEPENDENT_AMBULATORY_CARE_PROVIDER_SITE_OTHER): Payer: Self-pay | Admitting: Ophthalmology

## 2022-06-22 DIAGNOSIS — H35033 Hypertensive retinopathy, bilateral: Secondary | ICD-10-CM

## 2022-06-22 DIAGNOSIS — E113511 Type 2 diabetes mellitus with proliferative diabetic retinopathy with macular edema, right eye: Secondary | ICD-10-CM | POA: Diagnosis not present

## 2022-06-22 DIAGNOSIS — I10 Essential (primary) hypertension: Secondary | ICD-10-CM | POA: Diagnosis not present

## 2022-06-22 DIAGNOSIS — E113412 Type 2 diabetes mellitus with severe nonproliferative diabetic retinopathy with macular edema, left eye: Secondary | ICD-10-CM | POA: Diagnosis not present

## 2022-06-22 DIAGNOSIS — H3582 Retinal ischemia: Secondary | ICD-10-CM

## 2022-06-22 DIAGNOSIS — H25813 Combined forms of age-related cataract, bilateral: Secondary | ICD-10-CM

## 2022-06-22 DIAGNOSIS — H4311 Vitreous hemorrhage, right eye: Secondary | ICD-10-CM

## 2022-06-22 MED ORDER — BEVACIZUMAB CHEMO INJECTION 1.25MG/0.05ML SYRINGE FOR KALEIDOSCOPE
1.2500 mg | INTRAVITREAL | Status: AC | PRN
  Administered 2022-06-22: 1.25 mg via INTRAVITREAL

## 2022-07-25 NOTE — Progress Notes (Addendum)
[facility] Note  07/27/2022     CHIEF COMPLAINT Patient presents for Retina Follow Up   HISTORY OF PRESENT ILLNESS: [name] is a 57 y.o. male who presents to the clinic today for:   HPI     Retina Follow Up   Patient presents with  Diabetic Retinopathy.  In both eyes.  This started 4 weeks ago.  Severity is moderate.  Duration of 4 weeks.  Since onset it is stable.  I, the attending physician,  performed the HPI with the patient and updated documentation appropriately.        Comments   Patient states that the vision gets blurry when the shots wear off. He is not using any drops at this time. He has not checked his sugar and he doesn't know his A1C.       Last edited by Bernarda Caffey, MD on 07/27/2022 12:13 PM.     Referring physician: Clinic, [place],  [place] [address]  HISTORICAL INFORMATION:   Selected notes from the MEDICAL RECORD NUMBER Referred by [facility] for retinal hemes LEE:  Ocular Hx- PMH-    CURRENT MEDICATIONS: No current outpatient medications on file. (Ophthalmic Drugs)   No current facility-administered medications for this visit. (Ophthalmic Drugs)   Current Outpatient Medications (Other)  Medication Sig   AMLODIPINE BESYLATE PO Take 10 mg by mouth daily.   escitalopram (LEXAPRO) 10 MG tablet Take 10 mg by mouth daily. (Patient not taking: Reported on 10/31/2021)   gabapentin (NEURONTIN) 800 MG tablet Take 800 mg by mouth QID.   hydrOXYzine (VISTARIL) 25 MG capsule Take 25 mg by mouth 3 (three) times daily as needed. One tablet daily   insulin glargine (LANTUS) 100 UNIT/ML injection Inject 30 Units into the skin daily. Take 30 units once daily   losartan (COZAAR) 100 MG tablet Take 100 mg by mouth daily. (Patient not taking: Reported on 06/23/2021)   omeprazole (PRILOSEC) 40 MG capsule Take 40 mg by mouth daily.   OMEPRAZOLE PO Take by mouth. (Patient not taking: Reported  on 10/31/2021)   oxyCODONE-acetaminophen (PERCOCET) 10-325 MG tablet Take 1 tablet by mouth every 4 (four) hours as needed for pain. 1 q 6 hours and 1 at bedtime   oxyCODONE-acetaminophen (PERCOCET) 10-325 MG tablet Take 1 tablet by mouth every 4 (four) hours as needed for pain. (Patient not taking: Reported on 05/15/2022)   oxyCODONE-acetaminophen (PERCOCET) 5-325 MG tablet Take 1 tablet by mouth every 4 (four) hours as needed for severe pain. (Patient not taking: Reported on 05/15/2022)   polyethylene glycol (MIRALAX / GLYCOLAX) 17 g packet Take 17 g by mouth daily.   Semaglutide (OZEMPIC, 1 MG/DOSE, [place]) Inject 1 mg into the skin once a week.   sertraline (ZOLOFT) 25 MG tablet Take 25 mg by mouth daily. Per patient is taking 1/2 tablet daily   spironolactone (ALDACTONE) 25 MG tablet Take 25 mg by mouth daily.   Zolpidem Tartrate (AMBIEN PO) Take by mouth.   No current facility-administered medications for this visit. (Other)   REVIEW OF SYSTEMS: ROS   Positive for: Endocrine, Eyes Negative for: Constitutional, Gastrointestinal, Neurological, Skin, Genitourinary, Musculoskeletal, HENT, Cardiovascular, Respiratory, Psychiatric, Allergic/Imm, Heme/Lymph Last edited by Annie Paras, COT on 07/27/2022  8:38 AM.     ALLERGIES Allergies  Allergen Reactions   Lisinopril Other (See Comments)    Other reaction(s): Electrocardiogram abnormal   Sildenafil Nausea And Vomiting   PAST MEDICAL HISTORY  Past Medical History:  Diagnosis Date   Arthritis    Diabetes mellitus    Fistula, perirectal    GERD (gastroesophageal reflux disease)    Hypertension    Osteomyelitis of great toe of right foot ([place]) 10/31/2021   Polymicrobial bacterial infection 10/31/2021   PTSD (post-traumatic stress disorder)    PTSD (post-traumatic stress disorder) 10/31/2021   Small bowel obstruction (HCC)    Past Surgical History:  Procedure Laterality Date   RECTAL SURGERY     FAMILY HISTORY History reviewed. No  pertinent family history.  SOCIAL HISTORY Social History   Tobacco Use   Smoking status: Some Days    Types: Cigars   Smokeless tobacco: Never   Tobacco comments:    Occasional cigar  Vaping Use   Vaping Use: Never used  Substance Use Topics   Alcohol use: Not Currently    Comment: occ   Drug use: No       OPHTHALMIC EXAM:  Base Eye Exam     Visual Acuity (Snellen - Linear)       Right Left   Dist cc CF at 3' 20/20   Dist ph cc NI     Correction: Glasses         Tonometry (Tonopen, 8:42 AM)       Right Left   Pressure 17 19         Pupils       Dark Light Shape React APD   Right 2 2 Round Brisk None   Left 2 2 Round Brisk None         Visual Fields       Left Right    Full Full         Extraocular Movement       Right Left    Full, Ortho Full, Ortho         Neuro/Psych     Oriented x3: Yes   Mood/Affect: Normal         Dilation     Both eyes: 1.0% Mydriacyl, 2.5% Phenylephrine @ 8:38 AM           Slit Lamp and Fundus Exam     Slit Lamp Exam       Right Left   Lids/Lashes Dermatochalasis - upper lid Dermatochalasis - upper lid   Conjunctiva/Sclera mild melanosis, nasal pingeucula mild melanosis, nasal and temporal pinguecula   Cornea arcus, trace PEE, trace tear film debris arcus, trace PEE, mild tear film debris   Anterior Chamber deep, clear, narrow temporal angle Deep and quiet   Iris Round and dilated, No NVI Round and dilated, No NVI   Lens 3+ Nuclear sclerosis with brunescence, 2-3+ Cortical cataract 2-3+ Nuclear sclerosis with brunescence, 2-3+ Cortical cataract   Anterior Vitreous Vitreous syneresis, blood stained vitreous condensations - improved and settling inferiorly Vitreous syneresis         Fundus Exam       Right Left   Disc Pink and Sharp, no NVD, Compact Pink and Sharp, no NVD   C/D Ratio 0.2 0.2   Macula Flat, good foveal reflex, scattered MA/DBH, CWS SN / IT mac - improving Blunted foveal  reflex, central edema with cluster of DBH temporal fovea - improved, scattered DBH and CWS   Vessels attenuated, Tortuous attenuated, Tortuous, mild Copper wiring   Periphery Attached, scattered DBH and CWS posteriorly - improved Attached, scattered DBH and CWS posteriorly           Refraction  Wearing Rx       Sphere Cylinder Axis Add   Right +1.50 +0.25 090 +2.50   Left +2.50 +0.25 130 +2.50            IMAGING AND PROCEDURES  Imaging and Procedures for 07/27/2022  OCT, Retina - OU - Both Eyes       Right Eye Quality was borderline. Central Foveal Thickness: 235. Progression has improved. Findings include normal foveal contour, no IRF, no SRF, outer retinal atrophy, vitreomacular adhesion (Decreased central ellipsoid signal; persistent vitreous opacities -- slightly improved, especially nasal to disc; irregular lamination, mild scattered IRHM).   Left Eye Quality was borderline. Central Foveal Thickness: 281. Progression has improved. Findings include no SRF, abnormal foveal contour, intraretinal hyper-reflective material, intraretinal fluid (Stable improvement in cystic changes, interval improvement in [facility] and edema temporal macula ).   Notes *Images captured and stored on drive  Diagnosis / Impression:  OD: Decreased central ellipsoid signal; vitreous opacities improved--greatest nasal to disc; irregular lamination, mild scattered IRHM OS: Stable improvement in cystic changes, interval improvement in [facility] and edema temporal  macula   Clinical management:  See below  Abbreviations: NFP - Normal foveal profile. CME - cystoid macular edema. PED - pigment epithelial detachment. IRF - intraretinal fluid. SRF - subretinal fluid. EZ - ellipsoid zone. ERM - epiretinal membrane. ORA - outer retinal atrophy. ORT - outer retinal tubulation. SRHM - subretinal hyper-reflective material. IRHM - intraretinal hyper-reflective material      Intravitreal Injection, Pharmacologic  Agent - OD - Right Eye       Time Out 07/27/2022. 9:24 AM. Confirmed correct patient, procedure, site, and patient consented.   Anesthesia Topical anesthesia was used. Anesthetic medications included Lidocaine 2%, Proparacaine 0.5%.   Procedure Preparation included 5% betadine to ocular surface, eyelid speculum. A (32g) needle was used.   Injection: 1.25 mg Bevacizumab 1.39m/0.05ml   Route: Intravitreal, Site: Right Eye   NDC: 50242-060-01, Lot:: [id number] Expiration date: 08/31/2022   Post-op Post injection exam found visual acuity of at least counting fingers. The patient tolerated the procedure well. There were no complications. The patient received written and verbal post procedure care education.      Intravitreal Injection, Pharmacologic Agent - OS - Left Eye       Time Out 07/27/2022. 9:27 AM. Confirmed correct patient, procedure, site, and patient consented.   Anesthesia Topical anesthesia was used. Anesthetic medications included Lidocaine 2%, Proparacaine 0.5%.   Procedure Preparation included 5% betadine to ocular surface, eyelid speculum. A (33g) needle was used.   Injection: 1.25 mg Bevacizumab 1.254m0.05ml   Route: Intravitreal, Site: Left Eye   NDC: : 50093-818-29Lot: [id number] date: 04/24/2023   Post-op Post injection exam found visual acuity of at least counting fingers. The patient tolerated the procedure well. There were no complications. The patient received written and verbal post procedure care education. Post injection medications were not given.            ASSESSMENT/PLAN:    ICD-10-CM   1. 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 Vitreous hemorrhage of right eye ([place] H43.11     3. 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 Essential hypertension  I10     5. Hypertensive retinopathy of both eyes  H35.033     6. Retinal ischemia  H35.82     7. 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  Proliferative diabetic retinopathy OD, Severe non-proliferative diabetic retinopathy, OS  - pt lost to follow up from 06.16.23 to 11.14.23  - pt reports recent improvement in A1c from 11 down to 6 since starting Ozempic - s/p IVA OS #1 (03.22.23), #2 (04.21.23), #3 (05.19.23), #4 (06.16.23), #5 (11.15.23) #6 (12.22.24) - s/p IVA OD #1 (11.15.23) #2 (06/22/22) #3 (07/27/22) - exam shows scattered MA, DBH and CWS OU, mild VH OD and central cystic changes OS -- improving - new VH OD noted on 11.14.23 visit -- schedule PRP once VH cleared - FA (11.14.23) OD: Enlarged FAZ, scattered patches of vascular non-perfusion greatest temporal periphery, mild perivascular leakage temporal periphery and from MA, CB:JSEG enlargement of FAZ, scattered patches of vascular non-perfusion greatest temporal periphery, mild perivascular leakage temporal periphery and from MA - OCT shows OD: Decreased central ellipsoid signal; persistent vitreous opacities -- slightly improved, especially nasal to disc; irregular lamination, mild scattered IRHM; OS: Stable improvement in cystic changes, interval improvement in [facility] and edema temporal  macula  - recommend IVA OD #3 01.26.24 and IVA OS #7 today, 01.26.24 w/ f/u in 5 wks - pt wishes to proceed with injections - RBA of procedure discussed, questions answered - IVA informed consent obtained and signed, 11.16.23 (OU) - see procedure note - f/u 5 weeks, DFE, OCT, possible injxns - will plan for PRP OD in March 2024  4-6. Hypertensive retinopathy w/  retinal ischemia OU  - BP uncontrolled at initial consult -- 170s / 110-120s in office 12.23.22, BP was significantly improved to 147/80 at f/u visit  - FA 12.23.22 shows significant retinal ischemia OU -- enlarged FAZ OU (OD > OS) and significant patches of capillary drop out OU (OD>OS)  7. Mixed Cataract OU - The symptoms of cataract, surgical options, and treatments and risks were discussed with patient. - discussed diagnosis and progression - visually significant  - referred to [facility] for cat eval and establishment of primary eye care, but pt has not been there yet  Ophthalmic Meds Ordered this visit:  Meds ordered this encounter  Medications   Bevacizumab (AVASTIN) SOLN 1.25 mg   Bevacizumab (AVASTIN) SOLN 1.25 mg     Return in about 5 weeks (around 08/31/2022) for f/u PDR OU, DFE, OCT.  There are no Patient Instructions on file for this visit.   Explained the diagnoses, plan, and follow up with the patient and they expressed understanding.  Patient expressed understanding of the importance of proper follow up care.      This document serves as a record of services personally performed by Gardiner Sleeper, MD, PhD. It was created on their behalf by Bernarda Caffey, MD, an ophthalmic technician. The creation of this record is the provider's dictation and/or activities during the visit.    Electronically signed by: Bernarda Caffey, MD 07/25/2410:20 PM  Gardiner Sleeper, M.D., Ph.D. Diseases & Surgery of the Retina and [place] 07/27/2022   I have reviewed the above documentation for accuracy and completeness, and I  agree with the above. Gardiner Sleeper, M.D., Ph.D. 07/27/22 12:20 PM   Abbreviations: M myopia (nearsighted); A astigmatism; H hyperopia (farsighted); P presbyopia; Mrx spectacle prescription;  CTL contact lenses; OD right eye; OS left eye; OU both eyes  XT exotropia; ET esotropia; PEK punctate epithelial keratitis; PEE punctate  epithelial erosions; DES dry eye syndrome; MGD meibomian gland dysfunction; ATs artificial tears; PFAT's preservative free artificial tears; [place] nuclear sclerotic cataract; PSC posterior subcapsular cataract; ERM epi-retinal membrane; PVD posterior vitreous detachment; RD retinal detachment; DM diabetes mellitus; DR diabetic retinopathy; NPDR non-proliferative diabetic retinopathy; PDR proliferative diabetic retinopathy; CSME clinically significant macular edema; DME diabetic macular edema; dbh dot blot hemorrhages; CWS cotton wool spot; POAG primary open angle glaucoma; C/D cup-to-disc ratio; HVF humphrey visual field; GVF goldmann visual field; OCT optical coherence tomography; IOP intraocular pressure; BRVO Branch retinal vein occlusion; CRVO central retinal vein occlusion; CRAO central retinal artery occlusion; BRAO branch retinal artery occlusion; RT retinal tear; SB scleral buckle; PPV pars plana vitrectomy; VH Vitreous hemorrhage; PRP panretinal laser photocoagulation; IVK intravitreal kenalog; VMT vitreomacular traction; MH Macular hole;  NVD neovascularization of the disc; NVE neovascularization elsewhere; AREDS age related eye disease study; ARMD age related macular degeneration; POAG primary open angle glaucoma; EBMD epithelial/anterior basement membrane dystrophy; ACIOL anterior chamber intraocular lens; IOL intraocular lens; PCIOL posterior chamber intraocular lens; Phaco/IOL phacoemulsification with intraocular lens placement; [place] photorefractive keratectomy; LASIK laser assisted in situ keratomileusis; HTN hypertension; DM diabetes mellitus; COPD chronic obstructive pulmonary disease

## 2022-07-27 ENCOUNTER — Encounter (INDEPENDENT_AMBULATORY_CARE_PROVIDER_SITE_OTHER): Payer: Self-pay | Admitting: Ophthalmology

## 2022-07-27 ENCOUNTER — Ambulatory Visit (INDEPENDENT_AMBULATORY_CARE_PROVIDER_SITE_OTHER): Payer: No Typology Code available for payment source | Admitting: Ophthalmology

## 2022-07-27 DIAGNOSIS — E113511 Type 2 diabetes mellitus with proliferative diabetic retinopathy with macular edema, right eye: Secondary | ICD-10-CM

## 2022-07-27 DIAGNOSIS — H35033 Hypertensive retinopathy, bilateral: Secondary | ICD-10-CM

## 2022-07-27 DIAGNOSIS — E113413 Type 2 diabetes mellitus with severe nonproliferative diabetic retinopathy with macular edema, bilateral: Secondary | ICD-10-CM

## 2022-07-27 DIAGNOSIS — H4311 Vitreous hemorrhage, right eye: Secondary | ICD-10-CM

## 2022-07-27 DIAGNOSIS — H25813 Combined forms of age-related cataract, bilateral: Secondary | ICD-10-CM

## 2022-07-27 DIAGNOSIS — I10 Essential (primary) hypertension: Secondary | ICD-10-CM

## 2022-07-27 DIAGNOSIS — E113412 Type 2 diabetes mellitus with severe nonproliferative diabetic retinopathy with macular edema, left eye: Secondary | ICD-10-CM

## 2022-07-27 DIAGNOSIS — H3582 Retinal ischemia: Secondary | ICD-10-CM

## 2022-07-27 MED ORDER — BEVACIZUMAB CHEMO INJECTION 1.25MG/0.05ML SYRINGE FOR KALEIDOSCOPE
1.2500 mg | INTRAVITREAL | Status: AC | PRN
  Administered 2022-07-27: 1.25 mg via INTRAVITREAL

## 2022-08-29 NOTE — Progress Notes (Shared)
[facility] Note  08/30/2022     CHIEF COMPLAINT Patient presents for No chief complaint on file.   HISTORY OF PRESENT ILLNESS: [name] is a 57 y.o. male who presents to the clinic today for:     Referring physician: Clinic, [facility] [address] [place],  [place] [address]  HISTORICAL INFORMATION:   Selected notes from the MEDICAL RECORD NUMBER Referred by [facility] for retinal hemes LEE:  Ocular Hx- PMH-    CURRENT MEDICATIONS: No current outpatient medications on file. (Ophthalmic Drugs)   No current facility-administered medications for this visit. (Ophthalmic Drugs)   Current Outpatient Medications (Other)  Medication Sig   AMLODIPINE BESYLATE PO Take 10 mg by mouth daily.   escitalopram (LEXAPRO) 10 MG tablet Take 10 mg by mouth daily. (Patient not taking: Reported on 10/31/2021)   gabapentin (NEURONTIN) 800 MG tablet Take 800 mg by mouth QID.   hydrOXYzine (VISTARIL) 25 MG capsule Take 25 mg by mouth 3 (three) times daily as needed. One tablet daily   insulin glargine (LANTUS) 100 UNIT/ML injection Inject 30 Units into the skin daily. Take 30 units once daily   losartan (COZAAR) 100 MG tablet Take 100 mg by mouth daily. (Patient not taking: Reported on 06/23/2021)   omeprazole (PRILOSEC) 40 MG capsule Take 40 mg by mouth daily.   OMEPRAZOLE PO Take by mouth. (Patient not taking: Reported on 10/31/2021)   oxyCODONE-acetaminophen (PERCOCET) 10-325 MG tablet Take 1 tablet by mouth every 4 (four) hours as needed for pain. 1 q 6 hours and 1 at bedtime   oxyCODONE-acetaminophen (PERCOCET) 10-325 MG tablet Take 1 tablet by mouth every 4 (four) hours as needed for pain. (Patient not taking: Reported on 05/15/2022)   oxyCODONE-acetaminophen (PERCOCET) 5-325 MG tablet Take 1 tablet by mouth every 4 (four) hours as needed for severe pain. (Patient not taking: Reported on 05/15/2022)   polyethylene glycol (MIRALAX / GLYCOLAX) 17  g packet Take 17 g by mouth daily.   Semaglutide (OZEMPIC, 1 MG/DOSE, [place]) Inject 1 mg into the skin once a week.   sertraline (ZOLOFT) 25 MG tablet Take 25 mg by mouth daily. Per patient is taking 1/2 tablet daily   spironolactone (ALDACTONE) 25 MG tablet Take 25 mg by mouth daily.   Zolpidem Tartrate (AMBIEN PO) Take by mouth.   No current facility-administered medications for this visit. (Other)   REVIEW OF SYSTEMS:   ALLERGIES Allergies  Allergen Reactions   Lisinopril Other (See Comments)    Other reaction(s): Electrocardiogram abnormal   Sildenafil Nausea And Vomiting   PAST MEDICAL HISTORY Past Medical History:  Diagnosis Date   Arthritis    Diabetes mellitus    Fistula, perirectal    GERD (gastroesophageal reflux disease)    Hypertension    Osteomyelitis of great toe of right foot ([place]) 10/31/2021   Polymicrobial bacterial infection 10/31/2021   PTSD (post-traumatic stress disorder)    PTSD (post-traumatic stress disorder) 10/31/2021   Small bowel obstruction (HCC)    Past Surgical History:  Procedure Laterality Date   RECTAL SURGERY     FAMILY HISTORY No family history on file.  SOCIAL HISTORY Social History   Tobacco Use   Smoking status: Some Days    Types: Cigars   Smokeless tobacco: Never   Tobacco comments:    Occasional cigar  Vaping Use   Vaping Use: Never used  Substance Use Topics   Alcohol use: Not Currently    Comment: occ  Drug use: No       OPHTHALMIC EXAM:  Not recorded     IMAGING AND PROCEDURES  Imaging and Procedures for 08/30/2022          ASSESSMENT/PLAN:  No diagnosis found.  1-3.  Proliferative diabetic retinopathy OD, Severe non-proliferative diabetic retinopathy, OS  - pt lost to follow up from 06.16.23 to 11.14.23  - pt reports recent improvement in A1c from 11 down to 6 since starting Ozempic - s/p IVA OS #1 (03.22.23), #2 (04.21.23), #3 (05.19.23), #4 (06.16.23), #5 (11.15.23) #6 (12.22.24), #7 (01.26.24) - s/p  IVA OD #1 (11.15.23) #2 (06/22/22) #3 (1.26.24) - exam shows scattered MA, DBH and CWS OU, mild VH OD and central cystic changes OS -- improving - new VH OD noted on 11.14.23 visit -- schedule PRP once VH cleared - FA (11.14.23) OD: Enlarged FAZ, scattered patches of vascular non-perfusion greatest temporal periphery, mild perivascular leakage temporal periphery and from MA, [id number] enlargement of FAZ, scattered patches of vascular non-perfusion greatest temporal periphery, mild perivascular leakage temporal periphery and from MA - OCT shows OD: Decreased central ellipsoid signal; persistent vitreous opacities -- slightly improved, especially nasal to disc; irregular lamination, mild scattered IRHM; OS: Stable improvement in cystic changes, interval improvement in [facility] and edema temporal  macula  - recommend IVA OD #4 02.29.24 and IVA OS #8 today, 02.29.24 w/ f/u in 5 wks - pt wishes to proceed with injections - RBA of procedure discussed, questions answered - IVA informed consent obtained and signed, 11.16.23 (OU) - see procedure note - f/u 5 weeks, DFE, OCT, possible injxns - will plan for PRP OD in March 2024  4-6. Hypertensive retinopathy w/ retinal ischemia OU  - BP uncontrolled at initial consult -- 170s / 110-120s in office 12.23.22, BP was significantly improved to 147/80 at f/u visit  - FA 12.23.22 shows significant retinal ischemia OU -- enlarged FAZ OU (OD > OS) and significant patches of capillary drop out OU (OD>OS)  7. Mixed Cataract OU - The symptoms of cataract, surgical options, and treatments and risks were discussed with patient. - discussed diagnosis and progression - visually significant  - referred to [facility] for cat eval and establishment of primary eye care, but pt has not been there yet  Ophthalmic Meds Ordered this visit:  No orders of the defined types were placed in this encounter.    No follow-ups on file.  There are no Patient Instructions on file for  this visit.   Explained the diagnoses, plan, and follow up with the patient and they expressed understanding.  Patient expressed understanding of the importance of proper follow up care.      This document serves as a record of services personally performed by Gardiner Sleeper, MD, PhD. It was created on their behalf by Wenda Low, COT, an ophthalmic technician. The creation of this record is the provider's dictation and/or activities during the visit.    Electronically signed by: Wenda Low, COT 07/25/2408:40 AM  Gardiner Sleeper, M.D., Ph.D. Diseases & Surgery of the Retina and [place] 07/27/2022     Abbreviations: M myopia (nearsighted); A astigmatism; H hyperopia (farsighted); P presbyopia; Mrx spectacle prescription;  CTL contact lenses; OD right eye; OS left eye; OU both eyes  XT exotropia; ET esotropia; PEK punctate epithelial keratitis; PEE punctate epithelial erosions; DES dry eye syndrome; MGD meibomian gland dysfunction; ATs artificial tears; PFAT's preservative free artificial tears; [place] nuclear  sclerotic cataract; PSC posterior subcapsular cataract; ERM epi-retinal membrane; PVD posterior vitreous detachment; RD retinal detachment; DM diabetes mellitus; DR diabetic retinopathy; NPDR non-proliferative diabetic retinopathy; PDR proliferative diabetic retinopathy; CSME clinically significant macular edema; DME diabetic macular edema; dbh dot blot hemorrhages; CWS cotton wool spot; POAG primary open angle glaucoma; C/D cup-to-disc ratio; HVF humphrey visual field; GVF goldmann visual field; OCT optical coherence tomography; IOP intraocular pressure; BRVO Branch retinal vein occlusion; CRVO central retinal vein occlusion; CRAO central retinal artery occlusion; BRAO branch retinal artery occlusion; RT retinal tear; SB scleral buckle; PPV pars plana vitrectomy; VH Vitreous hemorrhage; PRP panretinal laser photocoagulation; IVK  intravitreal kenalog; VMT vitreomacular traction; MH Macular hole;  NVD neovascularization of the disc; NVE neovascularization elsewhere; AREDS age related eye disease study; ARMD age related macular degeneration; POAG primary open angle glaucoma; EBMD epithelial/anterior basement membrane dystrophy; ACIOL anterior chamber intraocular lens; IOL intraocular lens; PCIOL posterior chamber intraocular lens; Phaco/IOL phacoemulsification with intraocular lens placement; [place] photorefractive keratectomy; LASIK laser assisted in situ keratomileusis; HTN hypertension; DM diabetes mellitus; COPD chronic obstructive pulmonary disease

## 2022-08-30 ENCOUNTER — Emergency Department (HOSPITAL_BASED_OUTPATIENT_CLINIC_OR_DEPARTMENT_OTHER)
Admission: EM | Admit: 2022-08-30 | Discharge: 2022-08-30 | Disposition: A | Discharge status: Home / Self Care | Payer: No Typology Code available for payment source | Attending: Emergency Medicine | Admitting: Emergency Medicine

## 2022-08-30 ENCOUNTER — Other Ambulatory Visit: Payer: Self-pay

## 2022-08-30 ENCOUNTER — Encounter (INDEPENDENT_AMBULATORY_CARE_PROVIDER_SITE_OTHER): Payer: No Typology Code available for payment source | Admitting: Ophthalmology

## 2022-08-30 ENCOUNTER — Emergency Department (HOSPITAL_BASED_OUTPATIENT_CLINIC_OR_DEPARTMENT_OTHER): Payer: No Typology Code available for payment source

## 2022-08-30 ENCOUNTER — Encounter (HOSPITAL_BASED_OUTPATIENT_CLINIC_OR_DEPARTMENT_OTHER): Payer: Self-pay

## 2022-08-30 DIAGNOSIS — H25813 Combined forms of age-related cataract, bilateral: Secondary | ICD-10-CM

## 2022-08-30 DIAGNOSIS — I10 Essential (primary) hypertension: Secondary | ICD-10-CM | POA: Diagnosis not present

## 2022-08-30 DIAGNOSIS — R059 Cough, unspecified: Secondary | ICD-10-CM | POA: Insufficient documentation

## 2022-08-30 DIAGNOSIS — F17290 Nicotine dependence, other tobacco product, uncomplicated: Secondary | ICD-10-CM | POA: Diagnosis not present

## 2022-08-30 DIAGNOSIS — E119 Type 2 diabetes mellitus without complications: Secondary | ICD-10-CM | POA: Insufficient documentation

## 2022-08-30 DIAGNOSIS — H3582 Retinal ischemia: Secondary | ICD-10-CM

## 2022-08-30 DIAGNOSIS — E113412 Type 2 diabetes mellitus with severe nonproliferative diabetic retinopathy with macular edema, left eye: Secondary | ICD-10-CM

## 2022-08-30 DIAGNOSIS — H4311 Vitreous hemorrhage, right eye: Secondary | ICD-10-CM

## 2022-08-30 DIAGNOSIS — E113511 Type 2 diabetes mellitus with proliferative diabetic retinopathy with macular edema, right eye: Secondary | ICD-10-CM

## 2022-08-30 DIAGNOSIS — R0602 Shortness of breath: Secondary | ICD-10-CM | POA: Insufficient documentation

## 2022-08-30 DIAGNOSIS — R0789 Other chest pain: Secondary | ICD-10-CM | POA: Insufficient documentation

## 2022-08-30 DIAGNOSIS — H35033 Hypertensive retinopathy, bilateral: Secondary | ICD-10-CM

## 2022-08-30 DIAGNOSIS — J810 Acute pulmonary edema: Secondary | ICD-10-CM | POA: Diagnosis not present

## 2022-08-30 DIAGNOSIS — R079 Chest pain, unspecified: Secondary | ICD-10-CM

## 2022-08-30 LAB — CBC
HCT: 41.6 % (ref 39.0–52.0)
Hemoglobin: 14.7 g/dL (ref 13.0–17.0)
MCH: 31.9 pg (ref 26.0–34.0)
MCHC: 35.3 g/dL (ref 30.0–36.0)
MCV: 90.2 fL (ref 80.0–100.0)
Platelets: 203 10*3/uL (ref 150–400)
RBC: 4.61 MIL/uL (ref 4.22–5.81)
RDW: 12.5 % (ref 11.5–15.5)
WBC: 5.1 10*3/uL (ref 4.0–10.5)
nRBC: 0 % (ref 0.0–0.2)

## 2022-08-30 LAB — COMPREHENSIVE METABOLIC PANEL
ALT: 11 U/L (ref 0–44)
AST: 21 U/L (ref 15–41)
Albumin: 3.2 g/dL — ABNORMAL LOW (ref 3.5–5.0)
Alkaline Phosphatase: 58 U/L (ref 38–126)
Anion gap: 4 — ABNORMAL LOW (ref 5–15)
BUN: 11 mg/dL (ref 6–20)
CO2: 26 mmol/L (ref 22–32)
Calcium: 8.5 mg/dL — ABNORMAL LOW (ref 8.9–10.3)
Chloride: 104 mmol/L (ref 98–111)
Creatinine, Ser: 1 mg/dL (ref 0.61–1.24)
GFR, Estimated: >60 mL/min (ref >60)
Glucose, Bld: 190 mg/dL — ABNORMAL HIGH (ref 70–99)
Potassium: 4 mmol/L (ref 3.5–5.1)
Sodium: 134 mmol/L — ABNORMAL LOW (ref 135–145)
Total Bilirubin: 0.4 mg/dL (ref 0.3–1.2)
Total Protein: 6.6 g/dL (ref 6.5–8.1)

## 2022-08-30 LAB — BRAIN NATRIURETIC PEPTIDE: B Natriuretic Peptide: 185.5 pg/mL — ABNORMAL HIGH (ref 0.0–100.0)

## 2022-08-30 LAB — TROPONIN I (HIGH SENSITIVITY): Troponin I (High Sensitivity): 7 ng/L (ref <18)

## 2022-08-30 MED ORDER — LABETALOL HCL 5 MG/ML IV SOLN
10.0000 mg | Freq: Once | INTRAVENOUS | Status: AC
  Administered 2022-08-30: 10 mg via INTRAVENOUS
  Filled 2022-08-30: qty 4

## 2022-08-30 MED ORDER — FUROSEMIDE 10 MG/ML IJ SOLN
20.0000 mg | Freq: Once | INTRAMUSCULAR | Status: AC
  Administered 2022-08-30: 20 mg via INTRAVENOUS
  Filled 2022-08-30: qty 2

## 2022-08-30 NOTE — Discharge Instructions (Addendum)
You were evaluated in the Emergency Department and after careful evaluation, we did not find any emergent condition requiring admission or further testing in the hospital.  Your exam/testing today was overall reassuring.  Symptoms likely due to small amount of fluid in your lungs.  Important that you restart your blood pressure medication.  Recommend follow-up with the cardiologist.  They should call you for an appointment.  Please return to the Emergency Department if you experience any worsening of your condition.  Thank you for allowing [place] to be a part of your care.

## 2022-08-30 NOTE — ED Provider Notes (Signed)
[facility] Emergency Department Provider Note MRN:  [id number]  Arrival date & time: 08/30/22     Chief Complaint   Chest Pain, Shortness of Breath, and Cough   History of Present Illness   [name] is a 57 y.o. year-old male with a history of diabetes presenting to the ED with chief complaint of shortness of breath.  Cough recently, feels like he has chest congestion.  Left town and forgot his medications and so no medications for the past 2 days.  Feeling more more short of breath, especially when laying flat.  Having some mild chest tightness at this time as well.  Denies leg pain or swelling, no fever.  Abdomen feels distended but no pain.  Review of Systems  A thorough review of systems was obtained and all systems are negative except as noted in the HPI and PMH.   Patient's Health History    Past Medical History:  Diagnosis Date   Arthritis    Diabetes mellitus    Fistula, perirectal    GERD (gastroesophageal reflux disease)    Hypertension    Osteomyelitis of great toe of right foot ([place]) 10/31/2021   Polymicrobial bacterial infection 10/31/2021   PTSD (post-traumatic stress disorder)    PTSD (post-traumatic stress disorder) 10/31/2021   Small bowel obstruction (HCC)     Past Surgical History:  Procedure Laterality Date   RECTAL SURGERY      History reviewed. No pertinent family history.  Social History   Socioeconomic History   Marital status: Married    Spouse name: Not on file   Number of children: Not on file   Years of education: Not on file   Highest education level: Not on file  Occupational History   Not on file  Tobacco Use   Smoking status: Some Days    Types: Cigars   Smokeless tobacco: Never   Tobacco comments:    Occasional cigar  Vaping Use   Vaping Use: Never used  Substance and Sexual Activity   Alcohol use: Yes    Comment: occ   Drug use: No   Sexual activity: Not on file  Other Topics Concern   Not on  file  Social History Narrative   Not on file   Social Determinants of Health   Financial Resource Strain: Not on file  Food Insecurity: Not on file  Transportation Needs: Not on file  Physical Activity: Not on file  Stress: Not on file  Social Connections: Not on file  Intimate Partner Violence: Not on file     Physical Exam   Vitals:   08/30/22 0300 08/30/22 0330  BP: (!) 156/86 (!) 161/86  Pulse: 90 87  Resp: (!) 23 10  Temp:    SpO2: 91% 94%    CONSTITUTIONAL: Well-appearing, NAD NEURO/PSYCH:  Alert and oriented x 3, no focal deficits EYES:  eyes equal and reactive ENT/NECK:  no LAD, no JVD CARDIO: Regular rate, well-perfused, normal S1 and S2 PULM:  CTAB no wheezing or rhonchi GI/GU:  non-distended, non-tender MSK/SPINE:  No gross deformities, no edema SKIN:  no rash, atraumatic   *Additional and/or pertinent findings included in MDM below  Diagnostic and Interventional Summary    EKG Interpretation  Date/Time:  Thursday August 30 2022 01:50:31 EST Ventricular Rate:  98 PR Interval:  181 QRS Duration: 81 QT Interval:  353 QTC Calculation: 451 R Axis:   41 Text Interpretation: Sinus rhythm Confirmed by Gerlene Fee [phone]) on 08/30/2022 2:24:33  AM       Labs Reviewed  COMPREHENSIVE METABOLIC PANEL - Abnormal; Notable for the following components:      Result Value   Sodium 134 (*)    Glucose, Bld 190 (*)    Calcium 8.5 (*)    Albumin 3.2 (*)    Anion gap 4 (*)    All other components within normal limits  BRAIN NATRIURETIC PEPTIDE - Abnormal; Notable for the following components:   B Natriuretic Peptide 185.5 (*)    All other components within normal limits  CBC  TROPONIN I (HIGH SENSITIVITY)    DG Chest Port 1 View  Final Result      Medications  labetalol (NORMODYNE) injection 10 mg (10 mg Intravenous Given 08/30/22 0206)  furosemide (LASIX) injection 20 mg (20 mg Intravenous Given 08/30/22 0314)     Procedures  /  Critical  Care Procedures  ED Course and Medical Decision Making  Initial Impression and Ddx Suspicious for CHF exacerbation, would be new onset.  Possibly related to increased afterload in the setting of hypertension, no meds for the past 2 days.  Other considerations include pneumonia, ACS.  Doubt PE based on presentation.  Past medical/surgical history that increases complexity of ED encounter: Hypertension  Interpretation of Diagnostics I personally reviewed the EKG and my interpretation is as follows: Sinus rhythm, no concerning arrhythmias or ischemic findings  Labs reassuring with no significant blood count or electrolyte disturbance.  Troponin negative.  Patient Reassessment and Ultimate Disposition/Management     Chest pain and shortness of breath is resolved, blood pressure improved.  Chest x-ray revealing pulmonary edema and so dose of Lasix given.  Good urine output.  Feels well and would like to go home.  Had the chest tightness started at about noon today and so no need for a repeat.  He is referred to cardiology.  Patient management required discussion with the following services or consulting groups:  None  Complexity of Problems Addressed Acute illness or injury that poses threat of life of bodily function  Additional Data Reviewed and Analyzed Further history obtained from: None  Additional Factors Impacting ED Encounter Risk Consideration of hospitalization  Barth Kirks. Sedonia Small, MD [place] [email or web address]  Final Clinical Impressions(s) / ED Diagnoses     ICD-10-CM   1. Shortness of breath  R06.02 Ambulatory referral to Cardiology    2. Chest pain, unspecified type  R07.9 Ambulatory referral to Cardiology    3. Acute pulmonary edema ([place])  J81.0       ED Discharge Orders          Ordered    Ambulatory referral to Cardiology        08/30/22 0337             Discharge Instructions Discussed with and  Provided to Patient:     Discharge Instructions      You were evaluated in the Emergency Department and after careful evaluation, we did not find any emergent condition requiring admission or further testing in the hospital.  Your exam/testing today was overall reassuring.  Symptoms likely due to small amount of fluid in your lungs.  Important that you restart your blood pressure medication.  Recommend follow-up with the cardiologist.  They should call you for an appointment.  Please return to the Emergency Department if you experience any worsening of your condition.  Thank you for allowing [place] to be a part of your care.  Maudie Flakes, MD 08/30/22 [phone]

## 2022-08-30 NOTE — ED Triage Notes (Addendum)
Pt reports chest tightness that began yesterday. Endorses difficulty laying flat due to trouble breathing. Pt also states he has SOB and that began with the chest tightness. Pt has a congested cough that has been going on for a few months and is trying to quit smoking cigars. No hx of heart issues or CHF. No N/V/D. Pt also states his stomach feels tight like he gained weight and it began 2 days ago. Takes spironolactone for his BP per pt.

## 2022-08-31 ENCOUNTER — Encounter (INDEPENDENT_AMBULATORY_CARE_PROVIDER_SITE_OTHER): Payer: No Typology Code available for payment source | Admitting: Ophthalmology

## 2022-09-05 ENCOUNTER — Ambulatory Visit (INDEPENDENT_AMBULATORY_CARE_PROVIDER_SITE_OTHER): Payer: No Typology Code available for payment source | Admitting: Podiatry

## 2022-09-05 DIAGNOSIS — Z89411 Acquired absence of right great toe: Secondary | ICD-10-CM | POA: Diagnosis not present

## 2022-09-05 NOTE — Progress Notes (Signed)
Subjective:  Patient ID: [name], male    DOB: [birth year],  MRN: [id number]  Chief Complaint  Patient presents with   Routine Post Op    DOS: 01/08/2022 Procedure: Partial first ray amputation right foot  57 y.o. male returns for post-op check.  He states he is doing okay.  No acute complaints.  He is doing okay.  No further pain denies any other acute issues.  Review of Systems: Negative except as noted in the HPI. Denies N/V/F/Ch.  Past Medical History:  Diagnosis Date   Arthritis    Diabetes mellitus    Fistula, perirectal    GERD (gastroesophageal reflux disease)    Hypertension    Osteomyelitis of great toe of right foot ([place]) 10/31/2021   Polymicrobial bacterial infection 10/31/2021   PTSD (post-traumatic stress disorder)    PTSD (post-traumatic stress disorder) 10/31/2021   Small bowel obstruction (HCC)     Current Outpatient Medications:    AMLODIPINE BESYLATE PO, Take 10 mg by mouth daily., Disp: , Rfl:    escitalopram (LEXAPRO) 10 MG tablet, Take 10 mg by mouth daily. (Patient not taking: Reported on 10/31/2021), Disp: , Rfl:    gabapentin (NEURONTIN) 800 MG tablet, Take 800 mg by mouth QID., Disp: , Rfl:    hydrOXYzine (VISTARIL) 25 MG capsule, Take 25 mg by mouth 3 (three) times daily as needed. One tablet daily, Disp: , Rfl:    insulin glargine (LANTUS) 100 UNIT/ML injection, Inject 30 Units into the skin daily. Take 30 units once daily, Disp: , Rfl:    losartan (COZAAR) 100 MG tablet, Take 100 mg by mouth daily. (Patient not taking: Reported on 06/23/2021), Disp: , Rfl:    omeprazole (PRILOSEC) 40 MG capsule, Take 40 mg by mouth daily., Disp: , Rfl:    OMEPRAZOLE PO, Take by mouth. (Patient not taking: Reported on 10/31/2021), Disp: , Rfl:    oxyCODONE-acetaminophen (PERCOCET) 10-325 MG tablet, Take 1 tablet by mouth every 4 (four) hours as needed for pain. 1 q 6 hours and 1 at bedtime, Disp: , Rfl:    oxyCODONE-acetaminophen (PERCOCET) 10-325 MG tablet, Take 1 tablet  by mouth every 4 (four) hours as needed for pain. (Patient not taking: Reported on 05/15/2022), Disp: 60 tablet, Rfl: 0   oxyCODONE-acetaminophen (PERCOCET) 5-325 MG tablet, Take 1 tablet by mouth every 4 (four) hours as needed for severe pain. (Patient not taking: Reported on 05/15/2022), Disp: 30 tablet, Rfl: 0   polyethylene glycol (MIRALAX / GLYCOLAX) 17 g packet, Take 17 g by mouth daily., Disp: 14 each, Rfl: 1   Semaglutide (OZEMPIC, 1 MG/DOSE, [place]), Inject 1 mg into the skin once a week., Disp: , Rfl:    sertraline (ZOLOFT) 25 MG tablet, Take 25 mg by mouth daily. Per patient is taking 1/2 tablet daily, Disp: , Rfl:    spironolactone (ALDACTONE) 25 MG tablet, Take 25 mg by mouth daily., Disp: , Rfl:    Zolpidem Tartrate (AMBIEN PO), Take by mouth., Disp: , Rfl:   Social History   Tobacco Use  Smoking Status Some Days   Types: Cigars  Smokeless Tobacco Never  Tobacco Comments   Occasional cigar    Allergies  Allergen Reactions   Lisinopril Other (See Comments)    Other reaction(s): Electrocardiogram abnormal   Sildenafil Nausea And Vomiting   Objective:  There were no vitals filed for this visit. There is no height or weight on file to calculate BMI. Constitutional Well developed. Well nourished.  Vascular Foot warm and  well perfused. Capillary refill normal to all digits.   Neurologic Normal speech. Oriented to person, place, and time. Epicritic sensation to light touch grossly present bilaterally.  Dermatologic Clinically reepithelialized.  No further dehiscence is noted.  No signs of recurrence ulceration noted.  Orthopedic: No further tenderness to palpation noted about the surgical site.   Radiographs: 3 views of skeletally mature the right foot: Partial first ray amputation noted.  No signs of osteomyelitis noted no soft tissue or gas noted Assessment:   1. 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  S/p foot surgery right -Clinically healed and officially discharged from my care if any foot and ankle issues were future asked him to come back and see me.  He states understanding.  I discussed shoe gear modification as well. -Your prescription was given to the patient for diabetic shoes with insoles with filler.  No follow-ups on file. Healed and discharged

## 2022-09-10 ENCOUNTER — Encounter (INDEPENDENT_AMBULATORY_CARE_PROVIDER_SITE_OTHER): Payer: No Typology Code available for payment source | Admitting: Ophthalmology

## 2022-09-10 DIAGNOSIS — E113511 Type 2 diabetes mellitus with proliferative diabetic retinopathy with macular edema, right eye: Secondary | ICD-10-CM

## 2022-09-10 DIAGNOSIS — I10 Essential (primary) hypertension: Secondary | ICD-10-CM

## 2022-09-10 DIAGNOSIS — H4311 Vitreous hemorrhage, right eye: Secondary | ICD-10-CM

## 2022-09-10 DIAGNOSIS — H25813 Combined forms of age-related cataract, bilateral: Secondary | ICD-10-CM

## 2022-09-10 DIAGNOSIS — H35033 Hypertensive retinopathy, bilateral: Secondary | ICD-10-CM

## 2022-09-10 DIAGNOSIS — H3582 Retinal ischemia: Secondary | ICD-10-CM

## 2022-09-10 DIAGNOSIS — E113412 Type 2 diabetes mellitus with severe nonproliferative diabetic retinopathy with macular edema, left eye: Secondary | ICD-10-CM

## 2022-09-12 NOTE — Progress Notes (Signed)
[facility] Note  09/14/2022     CHIEF COMPLAINT Patient presents for Retina Follow Up   HISTORY OF PRESENT ILLNESS: [name] is a 57 y.o. male who presents to the clinic today for:   HPI     Retina Follow Up   Patient presents with  Diabetic Retinopathy.  In both eyes.  This started 5 weeks ago.        Comments   Patient here for 5 weeks retina follow up for PDR OU. Patient states vision last week was fading, blurry. Then comes back off /on. Can tell when it is time for injection. No eye pain.      Last edited by [name], COA on 09/14/2022  8:16 AM.     Patient states that he had fluid building up around his heart due to stopping the water pill and his BP medication. He has restarted both medications at this time.    Referring physician: [facility], [facility] [place],  [place] [address]  HISTORICAL INFORMATION:   Selected notes from the MEDICAL RECORD NUMBER Referred by [facility] for retinal hemes LEE:  Ocular Hx- PMH-    CURRENT MEDICATIONS: No current outpatient medications on file. (Ophthalmic Drugs)   No current facility-administered medications for this visit. (Ophthalmic Drugs)   Current Outpatient Medications (Other)  Medication Sig   AMLODIPINE BESYLATE PO Take 10 mg by mouth daily.   gabapentin (NEURONTIN) 800 MG tablet Take 800 mg by mouth QID.   hydrOXYzine (VISTARIL) 25 MG capsule Take 25 mg by mouth 3 (three) times daily as needed. One tablet daily   insulin glargine (LANTUS) 100 UNIT/ML injection Inject 30 Units into the skin daily. Take 30 units once daily   omeprazole (PRILOSEC) 40 MG capsule Take 40 mg by mouth daily.   oxyCODONE-acetaminophen (PERCOCET) 10-325 MG tablet Take 1 tablet by mouth every 4 (four) hours as needed for pain. 1 q 6 hours and 1 at bedtime   polyethylene glycol (MIRALAX / GLYCOLAX) 17 g packet Take 17 g by mouth daily.   Semaglutide (OZEMPIC, 1  MG/DOSE, [place]) Inject 1 mg into the skin once a week.   sertraline (ZOLOFT) 25 MG tablet Take 25 mg by mouth daily. Per patient is taking 1/2 tablet daily   spironolactone (ALDACTONE) 25 MG tablet Take 25 mg by mouth daily.   Zolpidem Tartrate (AMBIEN PO) Take by mouth.   escitalopram (LEXAPRO) 10 MG tablet Take 10 mg by mouth daily. (Patient not taking: Reported on 10/31/2021)   losartan (COZAAR) 100 MG tablet Take 100 mg by mouth daily. (Patient not taking: Reported on 06/23/2021)   OMEPRAZOLE PO Take by mouth. (Patient not taking: Reported on 10/31/2021)   oxyCODONE-acetaminophen (PERCOCET) 10-325 MG tablet Take 1 tablet by mouth every 4 (four) hours as needed for pain. (Patient not taking: Reported on 05/15/2022)   oxyCODONE-acetaminophen (PERCOCET) 5-325 MG tablet Take 1 tablet by mouth every 4 (four) hours as needed for severe pain. (Patient not taking: Reported on 05/15/2022)   No current facility-administered medications for this visit. (Other)   REVIEW OF SYSTEMS: ROS   Positive for: Endocrine, Eyes Negative for: Constitutional, Gastrointestinal, Neurological, Skin, Genitourinary, Musculoskeletal, HENT, Cardiovascular, Respiratory, Psychiatric, Allergic/Imm, Heme/Lymph Last edited by [name], COA on 09/14/2022  8:16 AM.      ALLERGIES Allergies  Allergen Reactions   Lisinopril Other (See Comments)    Other reaction(s): Electrocardiogram abnormal   Sildenafil Nausea And Vomiting  PAST MEDICAL HISTORY Past Medical History:  Diagnosis Date   Arthritis    Diabetes mellitus    Fistula, perirectal    GERD (gastroesophageal reflux disease)    Hypertension    Osteomyelitis of great toe of right foot ([place]) 10/31/2021   Polymicrobial bacterial infection 10/31/2021   PTSD (post-traumatic stress disorder)    PTSD (post-traumatic stress disorder) 10/31/2021   Small bowel obstruction (HCC)    Past Surgical History:  Procedure Laterality Date   RECTAL SURGERY     FAMILY  HISTORY History reviewed. No pertinent family history.  SOCIAL HISTORY Social History   Tobacco Use   Smoking status: Some Days    Types: Cigars   Smokeless tobacco: Never   Tobacco comments:    Occasional cigar  Vaping Use   Vaping Use: Never used  Substance Use Topics   Alcohol use: Yes    Comment: occ   Drug use: No       OPHTHALMIC EXAM:  Base Eye Exam     Visual Acuity (Snellen - Linear)       Right Left   Dist cc 20/250 -2 20/25 -2   Dist ph cc 20/200 -1 NI    Correction: Glasses         Tonometry (Tonopen, 8:08 AM)       Right Left   Pressure 19 15         Pupils       Dark Light Shape React APD   Right 2 2 Round Brisk None   Left 2 2 Round Brisk None         Visual Fields (Counting fingers)       Left Right    Full Full         Extraocular Movement       Right Left    Full, Ortho Full, Ortho         Neuro/Psych     Oriented x3: Yes   Mood/Affect: Normal         Dilation     Both eyes: 1.0% Mydriacyl, 2.5% Phenylephrine @ 8:08 AM           Slit Lamp and Fundus Exam     Slit Lamp Exam       Right Left   Lids/Lashes Dermatochalasis - upper lid Dermatochalasis - upper lid   Conjunctiva/Sclera mild melanosis, nasal pingeucula mild melanosis, nasal and temporal pinguecula   Cornea arcus, trace PEE, trace tear film debris arcus, trace PEE, mild tear film debris   Anterior Chamber deep, clear, narrow temporal angle Deep and quiet   Iris Round and dilated, No NVI Round and dilated, No NVI   Lens 3+ Nuclear sclerosis with brunescence, 2-3+ Cortical cataract 2-3+ Nuclear sclerosis with brunescence, 2-3+ Cortical cataract   Anterior Vitreous Vitreous syneresis, blood stained vitreous condensations - improved Vitreous syneresis         Fundus Exam       Right Left   Disc Pink and Sharp, no NVD, Compact Pink and Sharp, no NVD   C/D Ratio 0.2 0.2   Macula Flat, good foveal reflex, scattered MA/DBH, CWS SN / IT mac -  improving Blunted foveal reflex, central edema with cluster of DBH temporal fovea - improved, scattered DBH and CWS   Vessels attenuated, Tortuous attenuated, Tortuous, mild Copper wiring   Periphery Attached, scattered DBH and CWS posteriorly - improved Attached, scattered DBH and CWS posteriorly  Refraction     Wearing Rx       Sphere Cylinder Axis Add   Right +1.50 +0.25 090 +2.50   Left +2.50 +0.25 130 +2.50            IMAGING AND PROCEDURES  Imaging and Procedures for 09/14/2022  OCT, Retina - OU - Both Eyes       Right Eye Quality was borderline. Central Foveal Thickness: 228. Progression has improved. Findings include normal foveal contour, no IRF, no SRF, outer retinal atrophy, vitreomacular adhesion (central ellipsoid signal disruptions; persistent vitreous opacities -- slightly improved, especially nasal to disc; irregular lamination, mild scattered IRHM).   Left Eye Quality was borderline. Central Foveal Thickness: 282. Progression has improved. Findings include no SRF, abnormal foveal contour, intraretinal hyper-reflective material, intraretinal fluid (Trace persistent cystic changes, interval improvement in [facility] and edema temporal macula ).   Notes *Images captured and stored on drive  Diagnosis / Impression:  OD: central ellipsoid signal disruptions; persistent vitreous opacities -- slightly improved, especially nasal to disc; irregular lamination, mild scattered IRHM OS: Trace persistent cystic changes, interval improvement in [facility] and edema temporal  macula   Clinical management:  See below  Abbreviations: NFP - Normal foveal profile. CME - cystoid macular edema. PED - pigment epithelial detachment. IRF - intraretinal fluid. SRF - subretinal fluid. EZ - ellipsoid zone. ERM - epiretinal membrane. ORA - outer retinal atrophy. ORT - outer retinal tubulation. SRHM - subretinal hyper-reflective material. IRHM - intraretinal hyper-reflective  material      Intravitreal Injection, Pharmacologic Agent - OD - Right Eye       Time Out 09/14/2022. 8:25 AM. Confirmed correct patient, procedure, site, and patient consented.   Anesthesia Topical anesthesia was used. Anesthetic medications included Lidocaine 2%, Proparacaine 0.5%.   Procedure Preparation included 5% betadine to ocular surface, eyelid speculum. A (32g) needle was used.   Injection: 1.25 mg Bevacizumab 1.25mg /0.73ml   Route: Intravitreal, Site: Right Eye   NDC: B9831080, LotEP:[id number], Expiration date: 10/27/2022   Post-op Post injection exam found visual acuity of at least counting fingers. The patient tolerated the procedure well. There were no complications. The patient received written and verbal post procedure care education.      Intravitreal Injection, Pharmacologic Agent - OS - Left Eye       Time Out 09/14/2022. 8:26 AM. Confirmed correct patient, procedure, site, and patient consented.   Anesthesia Topical anesthesia was used. Anesthetic medications included Lidocaine 2%, Proparacaine 0.5%.   Procedure Preparation included 5% betadine to ocular surface, eyelid speculum. A (33g) needle was used.   Injection: 1.25 mg Bevacizumab 1.25mg /0.39ml   Route: Intravitreal, Site: Left Eye   NDC: B9831080, LotEX:[id number], Expiration date: 11/10/2022   Post-op Post injection exam found visual acuity of at least counting fingers. The patient tolerated the procedure well. There were no complications. The patient received written and verbal post procedure care education. Post injection medications were not given.             ASSESSMENT/PLAN:    ICD-10-CM   1. 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  Vitreous hemorrhage of right eye ([place])  H43.11     3. Severe nonproliferative diabetic retinopathy of left eye  with macular edema associated with type 2 diabetes mellitus ([place])  [id number]     4. Essential hypertension  I10     5. Hypertensive retinopathy of both eyes  H35.033     6. Retinal ischemia  H35.82     7. 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  Proliferative diabetic retinopathy OD, Severe non-proliferative diabetic retinopathy, OS  - pt lost to follow up from 06.16.23 to 11.14.23 - pt reports recent improvement in A1c from 11 down to 6 since starting Ozempic - s/p IVA OS #1 (03.22.23), #2 (04.21.23), #3 (05.19.23), #4 (06.16.23), #5 (11.15.23) #6 (12.22.24) # 7 (01.26.24) - s/p IVA OD #1 (11.15.23) #2 (12.22.23) #3 (01.26.24) - exam shows scattered MA, DBH and CWS OU, mild VH OD and central cystic changes OS -- improving - new VH OD noted on 11.14.23 visit -- schedule PRP once VH cleared - FA (11.14.23) OD: Enlarged FAZ, scattered patches of vascular non-perfusion greatest temporal periphery, mild perivascular leakage temporal periphery and from MA, [id number] enlargement of FAZ, scattered patches of vascular non-perfusion greatest temporal periphery, mild perivascular leakage temporal periphery and from MA - OCT shows OD: OD: central ellipsoid signal disruptions; persistent vitreous opacities -- slightly improved, especially nasal to disc; irregular lamination, mild scattered IRHM OS: Trace persistent cystic changes, interval improvement in [facility] and edema temporal  macula  - recommend IVA OD #4 03.15.24 and IVA OS #8 today, 03.15.24 w/ f/u in 5 wks - pt wishes to proceed with injections - RBA of procedure discussed, questions answered - IVA informed consent obtained and signed, 11.16.23 (OU) - see procedure note - f/u 7 weeks, DFE, OCT, possible injxns - 2-3 wks  for PRP OD   4-6. Hypertensive retinopathy w/ retinal ischemia OU - BP uncontrolled  at initial consult -- 170s / 110-120s in office 12.23.22, BP was significantly improved to 147/80 at f/u visit - FA 12.23.22 shows significant retinal ischemia OU -- enlarged FAZ OU (OD > OS) and significant patches of capillary drop out OU (OD>OS)  7. Mixed Cataract OU - The symptoms of cataract, surgical options, and treatments and risks were discussed with patient. - discussed diagnosis and progression - visually significant  - referred to [facility] for cat eval and establishment of primary eye care, but pt has not been there yet  Ophthalmic Meds Ordered this visit:  No orders of the defined types were placed in this encounter.    Return in about 7 weeks (around 11/02/2022) for f/u PDR OD, NPDR OS, DFE, OCT, Possible, IVA, OU, RTC 2-3wks PRP OD.  There are no Patient Instructions on file for this visit.    Electronically signed by: Joetta Manners COT 09/12/2022 8:50 AM  This document serves as a record of services personally performed by Gardiner Sleeper, MD, PhD. It was created on their behalf by Renaldo Reel, [place] an ophthalmic technician. The creation of this record is the provider's dictation and/or activities during the visit.    Electronically signed by:  Renaldo Reel, COT  03.15.24 8:12AM  Abbreviations: M myopia (nearsighted); A astigmatism; H hyperopia (farsighted); P presbyopia; Mrx spectacle prescription;  CTL contact lenses; OD right eye; OS left eye; OU both eyes  XT exotropia; ET esotropia; PEK punctate epithelial keratitis; PEE punctate epithelial erosions; DES dry eye syndrome; MGD meibomian gland dysfunction; ATs artificial tears; PFAT's preservative free artificial tears; [place] nuclear sclerotic cataract; PSC posterior subcapsular cataract; ERM epi-retinal membrane; PVD  posterior vitreous detachment; RD retinal detachment; DM diabetes mellitus; DR diabetic retinopathy; NPDR non-proliferative diabetic retinopathy; PDR proliferative diabetic retinopathy;  CSME clinically significant macular edema; DME diabetic macular edema; dbh dot blot hemorrhages; CWS cotton wool spot; POAG primary open angle glaucoma; C/D cup-to-disc ratio; HVF humphrey visual field; GVF goldmann visual field; OCT optical coherence tomography; IOP intraocular pressure; BRVO Branch retinal vein occlusion; CRVO central retinal vein occlusion; CRAO central retinal artery occlusion; BRAO branch retinal artery occlusion; RT retinal tear; SB scleral buckle; PPV pars plana vitrectomy; VH Vitreous hemorrhage; PRP panretinal laser photocoagulation; IVK intravitreal kenalog; VMT vitreomacular traction; MH Macular hole;  NVD neovascularization of the disc; NVE neovascularization elsewhere; AREDS age related eye disease study; ARMD age related macular degeneration; POAG primary open angle glaucoma; EBMD epithelial/anterior basement membrane dystrophy; ACIOL anterior chamber intraocular lens; IOL intraocular lens; PCIOL posterior chamber intraocular lens; Phaco/IOL phacoemulsification with intraocular lens placement; [place] photorefractive keratectomy; LASIK laser assisted in situ keratomileusis; HTN hypertension; DM diabetes mellitus; COPD chronic obstructive pulmonary disease

## 2022-09-14 ENCOUNTER — Ambulatory Visit (INDEPENDENT_AMBULATORY_CARE_PROVIDER_SITE_OTHER): Payer: No Typology Code available for payment source | Admitting: Ophthalmology

## 2022-09-14 ENCOUNTER — Encounter (INDEPENDENT_AMBULATORY_CARE_PROVIDER_SITE_OTHER): Payer: Self-pay | Admitting: Ophthalmology

## 2022-09-14 DIAGNOSIS — E113412 Type 2 diabetes mellitus with severe nonproliferative diabetic retinopathy with macular edema, left eye: Secondary | ICD-10-CM | POA: Diagnosis not present

## 2022-09-14 DIAGNOSIS — H4311 Vitreous hemorrhage, right eye: Secondary | ICD-10-CM

## 2022-09-14 DIAGNOSIS — I10 Essential (primary) hypertension: Secondary | ICD-10-CM

## 2022-09-14 DIAGNOSIS — H3582 Retinal ischemia: Secondary | ICD-10-CM

## 2022-09-14 DIAGNOSIS — E113511 Type 2 diabetes mellitus with proliferative diabetic retinopathy with macular edema, right eye: Secondary | ICD-10-CM | POA: Diagnosis not present

## 2022-09-14 DIAGNOSIS — H25813 Combined forms of age-related cataract, bilateral: Secondary | ICD-10-CM

## 2022-09-14 DIAGNOSIS — H35033 Hypertensive retinopathy, bilateral: Secondary | ICD-10-CM

## 2022-09-14 MED ORDER — BEVACIZUMAB CHEMO INJECTION 1.25MG/0.05ML SYRINGE FOR KALEIDOSCOPE
1.2500 mg | INTRAVITREAL | Status: AC | PRN
  Administered 2022-09-14: 1.25 mg via INTRAVITREAL

## 2022-09-27 ENCOUNTER — Ambulatory Visit
Payer: No Typology Code available for payment source | Attending: Interventional Cardiology | Admitting: Interventional Cardiology

## 2022-09-27 ENCOUNTER — Encounter: Payer: Self-pay | Admitting: Interventional Cardiology

## 2022-09-27 VITALS — BP 142/76 | HR 88 | Ht 69.0 in | Wt 197.8 lb

## 2022-09-27 DIAGNOSIS — R739 Hyperglycemia, unspecified: Secondary | ICD-10-CM | POA: Diagnosis not present

## 2022-09-27 DIAGNOSIS — R079 Chest pain, unspecified: Secondary | ICD-10-CM

## 2022-09-27 DIAGNOSIS — E785 Hyperlipidemia, unspecified: Secondary | ICD-10-CM

## 2022-09-27 DIAGNOSIS — R0602 Shortness of breath: Secondary | ICD-10-CM

## 2022-09-27 MED ORDER — FUROSEMIDE 40 MG PO TABS: 40.0000 mg | ORAL_TABLET | Freq: Every day | ORAL | 3 refills | Status: DC | PRN

## 2022-09-27 MED ORDER — ROSUVASTATIN CALCIUM 10 MG PO TABS: 10.0000 mg | ORAL_TABLET | Freq: Every day | ORAL | 3 refills | Status: DC

## 2022-09-27 NOTE — Progress Notes (Signed)
Cardiology Office Note   Date:  09/27/2022   ID:  [name], DOB 1966/05/02, MRN [id number]  PCP:  [facility]    No chief complaint on file.    Wt Readings from Last 3 Encounters:  09/27/22 197 lb 12.8 oz (89.7 kg)  08/30/22 210 lb (95.3 kg)  04/23/22 198 lb (89.8 kg)       History of Present Illness: [name] is a 57 y.o. male who is being seen today for the evaluation of fluid overload/CHF at the request of Bero, Barth Kirks, MD.  H/o DM, HTN, PTSD.  Records from [place]: "with a history of diabetes presenting to the ED with chief complaint of shortness of breath.   Cough recently, feels like he has chest congestion.  Left town and forgot his medications and so no medications for the past 2 days.  Feeling more more short of breath, especially when laying flat.  Having some mild chest tightness at this time as well.  Denies leg pain or swelling, no fever.  Abdomen feels distended but no pain."  Missed amlodipine, HCTZ and BP was very high in the ER.  Was given IV Lasix with resolution of fluid overload. "Suspicious for CHF exacerbation, would be new onset. Possibly related to increased afterload in the setting of hypertension, no meds for the past 2 days. Other considerations include pneumonia, ACS. Doubt PE based on presentation. " "Chest pain and shortness of breath is resolved, blood pressure improved. Chest x-ray revealing pulmonary edema and so dose of Lasix given. Good urine output. Feels well and would like to go home. Had the chest tightness started at about noon today and so no need for a repeat. He is referred to cardiology. "  normal troponin, mildly elevated BNP.  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  This has limited exercise.   Chest CT in 2023: "Cardiovascular: No aortic injury. The thoracic aorta is normal in caliber. The heart is normal in size. No significant pericardial effusion. Mild atherosclerotic plaque."  Since the ER visit,  volume has been stable.  Denies : Chest pain. Dizziness. Leg edema. Nitroglycerin use. Orthopnea. Palpitations. Paroxysmal nocturnal dyspnea. Shortness of breath. Syncope.     Past Medical History:  Diagnosis Date   Arthritis    Diabetes mellitus    Fistula, perirectal    GERD (gastroesophageal reflux disease)    Hypertension    Osteomyelitis of great toe of right foot ([place]) 10/31/2021   Polymicrobial bacterial infection 10/31/2021   PTSD (post-traumatic stress disorder)    PTSD (post-traumatic stress disorder) 10/31/2021   Small bowel obstruction (HCC)     Past Surgical History:  Procedure Laterality Date   RECTAL SURGERY       Current Outpatient Medications  Medication Sig Dispense Refill   AMLODIPINE BESYLATE PO Take 10 mg by mouth daily.     Cholecalciferol 25 MCG (1000 UT) capsule Take by mouth.     Docusate Sodium (DSS) 100 MG CAPS Take by mouth.     escitalopram (LEXAPRO) 10 MG tablet Take 10 mg by mouth daily.     furosemide (LASIX) 20 MG tablet Take 20 mg by mouth daily. Every other day     gabapentin (NEURONTIN) 800 MG tablet Take 800 mg by mouth QID.     hydrOXYzine (VISTARIL) 25 MG capsule Take 25 mg by mouth 3 (three) times daily as needed. One tablet daily     ibuprofen (ADVIL) 800 MG tablet Take 800  mg by mouth every 8 (eight) hours as needed.     loratadine (CLARITIN) 10 MG tablet Take by mouth.     losartan (COZAAR) 100 MG tablet Take 100 mg by mouth daily.     methocarbamol (ROBAXIN) 750 MG tablet TAKE ONE TABLET BY MOUTH TWICE A DAY AS NEEDED FOR MUSCLE SPASM     naloxone (NARCAN) nasal spray 4 mg/0.1 mL SPRAY 1 SPRAY INTO ONE NOSTRIL AS DIRECTED FOR OPIOID OVERDOSE - CALL 911 IMMEDIATELY, ADMINISTER DOSE, THEN TURN PERSON ON SIDE - IF NO RESPONSE IN 2-3 MINUTES OR PERSON RESPONDS BUT RELAPSES, REPEAT USING A NEW SPRAY DEVICE AND SPRAY INTO THE OTHER NOSTRIL     naproxen (NAPROSYN) 500 MG tablet Take 1 tablet by mouth 2 (two) times daily.     omeprazole (PRILOSEC)  40 MG capsule Take 40 mg by mouth daily.     oxyCODONE-acetaminophen (PERCOCET) 10-325 MG tablet Take 1 tablet by mouth every 4 (four) hours as needed for pain. 1 q 6 hours and 1 at bedtime     polyethylene glycol (MIRALAX / GLYCOLAX) 17 g packet Take 17 g by mouth daily. 14 each 1   ranitidine (ZANTAC) 75 MG tablet Take by mouth.     Semaglutide (OZEMPIC, 1 MG/DOSE, [place]) Inject 1 mg into the skin once a week.     sertraline (ZOLOFT) 25 MG tablet Take 25 mg by mouth daily. Per patient is taking 1/2 tablet daily     spironolactone (ALDACTONE) 25 MG tablet Take 25 mg by mouth daily.     testosterone cypionate (DEPOTESTOTERONE CYPIONATE) 100 MG/ML injection Inject 100 mg into the muscle once a week.     triamcinolone cream (KENALOG) 0.1 % APPLY THIN LAYER TO AFFECTED AREA TWICE A DAY AS NEEDED FOR ECZEMA     zolpidem (AMBIEN) 10 MG tablet 1 tablet at bedtime as needed Orally Once a day     No current facility-administered medications for this visit.    Allergies:   Lisinopril and Sildenafil    Social History:  The patient  reports that he has been smoking cigars. He has never used smokeless tobacco. He reports current alcohol use. He reports that he does not use drugs.   Family History:  The patient's family history includes Heart attack in his father; Obesity in his father. MI at 66, he was 500 lbs.; Bro with DM.   ROS:  Please see the history of present illness.   Otherwise, review of systems are positive for cough, phlegm.   All other systems are reviewed and negative.    PHYSICAL EXAM: VS:  BP (!) 142/76   Pulse 88   Ht 5\' 9"  (1.753 m)   Wt 197 lb 12.8 oz (89.7 kg)   SpO2 99%   BMI 29.21 kg/m  , BMI Body mass index is 29.21 kg/m. GEN: Well nourished, well developed, in no acute distress HEENT: normal Neck: no JVD, carotid bruits, or masses Cardiac: RRR; no murmurs, rubs, or gallops,no edema  Respiratory:  clear to auscultation bilaterally, normal work of breathing GI: soft,  nontender, nondistended, + BS MS: no deformity or atrophy Skin: warm and dry, no rash Neuro:  Strength and sensation are intact Psych: euthymic mood, full affect   EKG:   The ekg ordered in 2/24 demonstrates NSR, no ST changes   Recent Labs: 08/30/2022: ALT 11; B Natriuretic Peptide 185.5; BUN 11; Creatinine, Ser 1.00; Hemoglobin 14.7; Platelets 203; Potassium 4.0; Sodium 134   Lipid Panel No  results found for: "CHOL", "TRIG", "HDL", "CHOLHDL", "VLDL", "LDLCALC", "LDLDIRECT"   Other studies Reviewed: Additional studies/ records that were reviewed today with results demonstrating: ER records reviewed .   ASSESSMENT AND PLAN:  CHF: unclear systolic vs. Diastolic.  Continue Lasix 40 mg 3 times a week.  Check BMet today.   Check echocardiogram.  Chest tightness was in the setting of volume overload.  Based on echocardiogram result, can consider further ischemic workup.  He is trying to get more activity.  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 Coronary artery calcification: mild plaque on chest CT. start rosuvastatin 10 mg daily.  Check lipids and liver tests in 3 months. Tobacco abuse:  smokes cigars.  Needs to stop. HTN: Check of the blood pressure at home will be helpful.  Continue losartan,Amlodipine, spironolactone DM: A1C check today.  Avoid processed foods.  Whole food, plant-based diet.  High-fiber diet. Family h/o CAD: Father with early CAD.   Current medicines are reviewed at length with the patient today.  The patient concerns regarding his medicines were addressed.  The following changes have been made:  add statin  Labs/ tests ordered today include: as above No orders of the defined types were placed in this encounter.   Recommend 150 minutes/week of aerobic exercise Low fat, low carb, high fiber diet recommended  Disposition:   FU in 3-4 months based on echo   Signed, Larae Grooms, MD  09/27/2022 10:49 AM    [facility] [place], [place], [place]  [address] Phone: [phone]; Fax: [phone]

## 2022-09-27 NOTE — Patient Instructions (Addendum)
Medication Instructions:  Your physician has recommended you make the following change in your medication:  Start furosemide 40 mg by mouth daily as needed Start Rosuvastatin 10 mg by mouth daily   *If you need a refill on your cardiac medications before your next appointment, please call your pharmacy*   Lab Work:  Your physician recommends that you return for lab work in: 3 months.  CMET and Lipids.  This will be fasting  Lab work to be done today--BMET and A1C If you have labs (blood work) drawn today and your tests are completely normal, you will receive your results only by: [place] (if you have MyChart) OR A paper copy in the mail If you have any lab test that is abnormal or we need to change your treatment, we will call you to review the results.   Testing/Procedures: Your physician has requested that you have an echocardiogram. Echocardiography is a painless test that uses sound waves to create images of your heart. It provides your doctor with information about the size and shape of your heart and how well your heart's chambers and valves are working. This procedure takes approximately one hour. There are no restrictions for this procedure. Please do NOT wear cologne, perfume, aftershave, or lotions (deodorant is allowed). Please arrive 15 minutes prior to your appointment time.    Follow-Up: At [facility], you and your health needs are our priority.  As part of our continuing mission to provide you with exceptional heart care, we have created designated Provider Care Teams.  These Care Teams include your primary Cardiologist (physician) and Advanced Practice Providers (APPs -  Physician Assistants and Nurse Practitioners) who all work together to provide you with the care you need, when you need it.  We recommend signing up for the patient portal called "MyChart".  Sign up information is provided on this After Visit Summary.  MyChart is used to connect with  patients for Virtual Visits (Telemedicine).  Patients are able to view lab/test results, encounter notes, upcoming appointments, etc.  Non-urgent messages can be sent to your provider as well.   To learn more about what you can do with MyChart, go to [email or web address].    Your next appointment:   Based on results  Provider:   Larae Grooms, MD     Other Instructions

## 2022-09-28 LAB — BASIC METABOLIC PANEL
BUN/Creatinine Ratio: 19 (ref 9–20)
BUN: 17 mg/dL (ref 6–24)
CO2: 21 mmol/L (ref 20–29)
Calcium: 9.6 mg/dL (ref 8.7–10.2)
Chloride: 99 mmol/L (ref 96–106)
Creatinine, Ser: 0.9 mg/dL (ref 0.76–1.27)
Glucose: 202 mg/dL — ABNORMAL HIGH (ref 70–99)
Potassium: 5.1 mmol/L (ref 3.5–5.2)
Sodium: 136 mmol/L (ref 134–144)
eGFR: 100 mL/min/{1.73_m2} (ref >59)

## 2022-09-28 LAB — HEMOGLOBIN A1C
Est. average glucose Bld gHb Est-mCnc: 194 mg/dL
Hgb A1c MFr Bld: 8.4 % — ABNORMAL HIGH (ref 4.8–5.6)

## 2022-10-01 ENCOUNTER — Telehealth: Payer: Self-pay | Admitting: *Deleted

## 2022-10-01 NOTE — Telephone Encounter (Signed)
Patient notified. Lab results routed to [facility]

## 2022-10-01 NOTE — Telephone Encounter (Signed)
-----   Message from Jettie Booze, MD sent at 09/30/2022 10:02 AM EDT ----- Electrolytes stable.  Can increase frequency of LAsix to daily if he feels volume overload or notes weight gain > 3 lbs. A1C still elevated.  Please forward to PCP.

## 2022-10-03 ENCOUNTER — Telehealth: Payer: Self-pay | Admitting: Interventional Cardiology

## 2022-10-03 NOTE — Telephone Encounter (Signed)
Faxed last office notes

## 2022-10-03 NOTE — Telephone Encounter (Signed)
[name] is requesting a copy of 3/28 office visit notes.  Phone#: [phone] Fax#: [phone]

## 2022-10-05 ENCOUNTER — Encounter (INDEPENDENT_AMBULATORY_CARE_PROVIDER_SITE_OTHER): Payer: No Typology Code available for payment source | Admitting: Ophthalmology

## 2022-10-05 ENCOUNTER — Encounter (INDEPENDENT_AMBULATORY_CARE_PROVIDER_SITE_OTHER): Payer: Self-pay

## 2022-10-05 DIAGNOSIS — H3582 Retinal ischemia: Secondary | ICD-10-CM

## 2022-10-05 DIAGNOSIS — H35033 Hypertensive retinopathy, bilateral: Secondary | ICD-10-CM

## 2022-10-05 DIAGNOSIS — H25813 Combined forms of age-related cataract, bilateral: Secondary | ICD-10-CM

## 2022-10-05 DIAGNOSIS — E113412 Type 2 diabetes mellitus with severe nonproliferative diabetic retinopathy with macular edema, left eye: Secondary | ICD-10-CM

## 2022-10-05 DIAGNOSIS — E113511 Type 2 diabetes mellitus with proliferative diabetic retinopathy with macular edema, right eye: Secondary | ICD-10-CM

## 2022-10-05 DIAGNOSIS — H4311 Vitreous hemorrhage, right eye: Secondary | ICD-10-CM

## 2022-10-05 DIAGNOSIS — I10 Essential (primary) hypertension: Secondary | ICD-10-CM

## 2022-10-19 ENCOUNTER — Encounter (INDEPENDENT_AMBULATORY_CARE_PROVIDER_SITE_OTHER): Payer: No Typology Code available for payment source | Admitting: Ophthalmology

## 2022-10-19 DIAGNOSIS — H25813 Combined forms of age-related cataract, bilateral: Secondary | ICD-10-CM

## 2022-10-19 DIAGNOSIS — H35033 Hypertensive retinopathy, bilateral: Secondary | ICD-10-CM

## 2022-10-19 DIAGNOSIS — E113412 Type 2 diabetes mellitus with severe nonproliferative diabetic retinopathy with macular edema, left eye: Secondary | ICD-10-CM

## 2022-10-19 DIAGNOSIS — I10 Essential (primary) hypertension: Secondary | ICD-10-CM

## 2022-10-19 DIAGNOSIS — E113511 Type 2 diabetes mellitus with proliferative diabetic retinopathy with macular edema, right eye: Secondary | ICD-10-CM

## 2022-10-19 DIAGNOSIS — H3582 Retinal ischemia: Secondary | ICD-10-CM

## 2022-10-19 DIAGNOSIS — H4311 Vitreous hemorrhage, right eye: Secondary | ICD-10-CM

## 2022-10-22 ENCOUNTER — Ambulatory Visit (INDEPENDENT_AMBULATORY_CARE_PROVIDER_SITE_OTHER): Payer: No Typology Code available for payment source | Admitting: Ophthalmology

## 2022-10-22 ENCOUNTER — Encounter (INDEPENDENT_AMBULATORY_CARE_PROVIDER_SITE_OTHER): Payer: Self-pay | Admitting: Ophthalmology

## 2022-10-22 DIAGNOSIS — H4311 Vitreous hemorrhage, right eye: Secondary | ICD-10-CM | POA: Diagnosis not present

## 2022-10-22 DIAGNOSIS — E113412 Type 2 diabetes mellitus with severe nonproliferative diabetic retinopathy with macular edema, left eye: Secondary | ICD-10-CM | POA: Diagnosis not present

## 2022-10-22 DIAGNOSIS — I10 Essential (primary) hypertension: Secondary | ICD-10-CM | POA: Diagnosis not present

## 2022-10-22 DIAGNOSIS — H35033 Hypertensive retinopathy, bilateral: Secondary | ICD-10-CM

## 2022-10-22 DIAGNOSIS — E113511 Type 2 diabetes mellitus with proliferative diabetic retinopathy with macular edema, right eye: Secondary | ICD-10-CM

## 2022-10-22 DIAGNOSIS — H25813 Combined forms of age-related cataract, bilateral: Secondary | ICD-10-CM

## 2022-10-22 DIAGNOSIS — H3582 Retinal ischemia: Secondary | ICD-10-CM

## 2022-10-22 MED ORDER — PREDNISOLONE ACETATE 1 % OP SUSP: 1.0000 [drp] | Freq: Four times a day (QID) | OPHTHALMIC | 0 refills | Status: AC

## 2022-10-22 NOTE — Progress Notes (Signed)
[facility] - Clinic Note  10/22/2022     CHIEF COMPLAINT Patient presents for Retina Follow Up   HISTORY OF PRESENT ILLNESS: [name] is a 57 y.o. male who presents to the clinic today for:   HPI     Retina Follow Up   Patient presents with  Diabetic Retinopathy.  In both eyes.  This started 7 weeks ago.  I, the attending physician,  performed the HPI with the patient and updated documentation appropriately.        Comments   Patient here for 7 weeks retina follow up for PDR OD, NPDR OS. Patient states vision can tell when needs appointment. No eye pain. Wearing old glasses. Broke current RX glasses. Got glasses fixed. Current glasses not right. Wearing old RX.      Last edited by [name], MD on 10/22/2022 11:31 AM.       Referring physician: Clinic, [place] [address] [facility] [place],  [place] [address]  HISTORICAL INFORMATION:   Selected notes from the MEDICAL RECORD NUMBER Referred by [facility] for retinal hemes LEE:  Ocular Hx- PMH-    CURRENT MEDICATIONS: Current Outpatient Medications (Ophthalmic Drugs)  Medication Sig   prednisoLONE acetate (PRED FORTE) 1 % ophthalmic suspension Place 1 drop into the right eye 4 (four) times daily for 7 days.   No current facility-administered medications for this visit. (Ophthalmic Drugs)   Current Outpatient Medications (Other)  Medication Sig   AMLODIPINE BESYLATE PO Take 10 mg by mouth daily.   Cholecalciferol 25 MCG (1000 UT) capsule Take by mouth.   Docusate Sodium (DSS) 100 MG CAPS Take by mouth.   escitalopram (LEXAPRO) 10 MG tablet Take 10 mg by mouth daily.   furosemide (LASIX) 40 MG tablet Take 1 tablet (40 mg total) by mouth daily as needed.   gabapentin (NEURONTIN) 800 MG tablet Take 800 mg by mouth QID.   hydrOXYzine (VISTARIL) 25 MG capsule Take 25 mg by mouth 3 (three) times daily as needed. One tablet daily   ibuprofen (ADVIL) 800 MG tablet Take 800 mg by  mouth every 8 (eight) hours as needed.   loratadine (CLARITIN) 10 MG tablet Take by mouth.   losartan (COZAAR) 100 MG tablet Take 100 mg by mouth daily.   methocarbamol (ROBAXIN) 750 MG tablet TAKE ONE TABLET BY MOUTH TWICE A DAY AS NEEDED FOR MUSCLE SPASM   naloxone (NARCAN) nasal spray 4 mg/0.1 mL SPRAY 1 SPRAY INTO ONE NOSTRIL AS DIRECTED FOR OPIOID OVERDOSE - CALL 911 IMMEDIATELY, ADMINISTER DOSE, THEN TURN PERSON ON SIDE - IF NO RESPONSE IN 2-3 MINUTES OR PERSON RESPONDS BUT RELAPSES, REPEAT USING A NEW SPRAY DEVICE AND SPRAY INTO THE OTHER NOSTRIL   naproxen (NAPROSYN) 500 MG tablet Take 1 tablet by mouth 2 (two) times daily.   omeprazole (PRILOSEC) 40 MG capsule Take 40 mg by mouth daily.   oxyCODONE-acetaminophen (PERCOCET) 10-325 MG tablet Take 1 tablet by mouth every 4 (four) hours as needed for pain. 1 q 6 hours and 1 at bedtime   polyethylene glycol (MIRALAX / GLYCOLAX) 17 g packet Take 17 g by mouth daily.   ranitidine (ZANTAC) 75 MG tablet Take by mouth.   rosuvastatin (CRESTOR) 10 MG tablet Take 1 tablet (10 mg total) by mouth daily.   Semaglutide (OZEMPIC, 1 MG/DOSE, [place]) Inject 1 mg into the skin once a week.   sertraline (ZOLOFT) 25 MG tablet Take 25 mg by mouth daily. Per patient is taking 1/2  tablet daily   spironolactone (ALDACTONE) 25 MG tablet Take 25 mg by mouth daily.   testosterone cypionate (DEPOTESTOTERONE CYPIONATE) 100 MG/ML injection Inject 100 mg into the muscle once a week.   triamcinolone cream (KENALOG) 0.1 % APPLY THIN LAYER TO AFFECTED AREA TWICE A DAY AS NEEDED FOR ECZEMA   zolpidem (AMBIEN) 10 MG tablet 1 tablet at bedtime as needed Orally Once a day   No current facility-administered medications for this visit. (Other)   REVIEW OF SYSTEMS: ROS   Positive for: Endocrine, Eyes Negative for: Constitutional, Gastrointestinal, Neurological, Skin, Genitourinary, Musculoskeletal, HENT, Cardiovascular, Respiratory, Psychiatric, Allergic/Imm, Heme/Lymph Last  edited by Laddie Aquas, COA on 10/22/2022  8:58 AM.      ALLERGIES Allergies  Allergen Reactions   Lisinopril Other (See Comments) and Hives    Other reaction(s): Electrocardiogram abnormal  Other Reaction(s): Electrocardiogram abnormal, Abdominal pain, Electrocardiogram abnormal, Abdominal pain   Sildenafil Nausea And Vomiting   PAST MEDICAL HISTORY Past Medical History:  Diagnosis Date   Arthritis    Diabetes mellitus    Fistula, perirectal    GERD (gastroesophageal reflux disease)    Hypertension    Osteomyelitis of great toe of right foot 10/31/2021   Polymicrobial bacterial infection 10/31/2021   PTSD (post-traumatic stress disorder)    PTSD (post-traumatic stress disorder) 10/31/2021   Small bowel obstruction    Past Surgical History:  Procedure Laterality Date   RECTAL SURGERY     FAMILY HISTORY Family History  Problem Relation Age of Onset   Heart attack Father    Obesity Father     SOCIAL HISTORY Social History   Tobacco Use   Smoking status: Some Days    Types: Cigars   Smokeless tobacco: Never   Tobacco comments:    Occasional cigar  Vaping Use   Vaping Use: Never used  Substance Use Topics   Alcohol use: Yes    Comment: occ   Drug use: No       OPHTHALMIC EXAM:  Base Eye Exam     Visual Acuity (Snellen - Linear)       Right Left   Dist cc 20/150 20/25   Dist ph cc NI NI    Correction: Glasses         Tonometry (Tonopen, 8:52 AM)       Right Left   Pressure 16 15         Pupils       Dark Light Shape React APD   Right 2 2 Round Brisk None   Left 2 2 Round Brisk None         Visual Fields (Counting fingers)       Left Right    Full Full         Extraocular Movement       Right Left    Full, Ortho Full, Ortho         Neuro/Psych     Oriented x3: Yes   Mood/Affect: Normal         Dilation     Both eyes: 1.0% Mydriacyl, 2.5% Phenylephrine @ 8:52 AM           Slit Lamp and Fundus Exam      Slit Lamp Exam       Right Left   Lids/Lashes Dermatochalasis - upper lid Dermatochalasis - upper lid   Conjunctiva/Sclera mild melanosis, nasal pingeucula mild melanosis, nasal and temporal pinguecula   Cornea arcus, trace PEE, trace tear film debris  arcus, trace PEE, mild tear film debris   Anterior Chamber deep, clear, narrow temporal angle Deep and quiet   Iris Round and dilated, No NVI Round and dilated, No NVI   Lens 3+ Nuclear sclerosis with brunescence, 2-3+ Cortical cataract 2-3+ Nuclear sclerosis with brunescence, 2-3+ Cortical cataract   Anterior Vitreous Vitreous syneresis, blood stained vitreous condensations - improved Vitreous syneresis         Fundus Exam       Right Left   Disc Pink and Sharp, no NVD, Compact Pink and Sharp, no NVD   C/D Ratio 0.2 0.2   Macula Flat, good foveal reflex, scattered MA/DBH, CWS SN / IT mac - improving Blunted foveal reflex, central edema with cluster of DBH temporal fovea - improved, scattered DBH and CWS   Vessels attenuated, mild tortuosity attenuated, Tortuous, mild Copper wiring   Periphery Attached, scattered DBH and CWS posteriorly - improved Attached, scattered DBH and CWS posteriorly           Refraction     Wearing Rx       Sphere Cylinder Axis Add   Right +2.50 +0.50 005 +2.50   Left +2.75 +0.75 009 +2.50  Wearing Old glasses. Broke current RX. Sent to [place] to fix. When got them back not right. Still wearing old glasses.           IMAGING AND PROCEDURES  Imaging and Procedures for 10/22/2022  OCT, Retina - OU - Both Eyes       Right Eye Quality was borderline. Central Foveal Thickness: 222. Progression has improved. Findings include normal foveal contour, no IRF, no SRF, outer retinal atrophy, vitreomacular adhesion (central ellipsoid signal disruptions; persistent vitreous opacities -- improved; irregular lamination, mild scattered IRHM).   Left Eye Quality was borderline. Central Foveal Thickness: 280.  Progression has improved. Findings include no SRF, abnormal foveal contour, intraretinal hyper-reflective material, intraretinal fluid (Trace persistent cystic changes, interval improvement in IRHM and edema SN and IT fovea).   Notes *Images captured and stored on drive  Diagnosis / Impression:  OD: central ellipsoid signal disruptions; persistent vitreous opacities -- improved; irregular lamination, mild scattered IRHM OS: Trace persistent cystic changes, interval improvement in IRHM and edema SN and IT fovea  Clinical management:  See below  Abbreviations: NFP - Normal foveal profile. CME - cystoid macular edema. PED - pigment epithelial detachment. IRF - intraretinal fluid. SRF - subretinal fluid. EZ - ellipsoid zone. ERM - epiretinal membrane. ORA - outer retinal atrophy. ORT - outer retinal tubulation. SRHM - subretinal hyper-reflective material. IRHM - intraretinal hyper-reflective material      Panretinal Photocoagulation - OD - Right Eye       LASER PROCEDURE NOTE  Diagnosis:   Proliferative Diabetic Retinopathy, RIGHT EYE  Procedure:  Pan-retinal photocoagulation using slit lamp laser, RIGHT EYE  Anesthesia:  Topical  Surgeon: [name], MD, PhD  Informed consent obtained, operative eye marked, and time out performed prior to initiation of laser.   Lumenis [id number] slit lamp laser Pattern: 3x3 square Power: 280 mW Duration: 30 msec  Spot size: 200 microns  # spots: 1263 spots  Complications: None.  RTC: May 3 or later  Patient tolerated the procedure well and received written and verbal post-procedure care information/education.            ASSESSMENT/PLAN:    ICD-10-CM   1. 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 Vitreous hemorrhage of right eye  H43.11     3. Severe nonproliferative  diabetic retinopathy of left eye with macular edema associated with type 2 diabetes mellitus  E11.3412     4. Essential hypertension  I10     5. Hypertensive retinopathy of both eyes  H35.033     6. Retinal ischemia  H35.82     7. 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  Proliferative diabetic retinopathy OD, Severe non-proliferative diabetic retinopathy, OS  - delayed f/u -- 7 wks instead of 5  - pt lost to follow up from 06.16.23 to 11.14.23 - pt reports recent improvement in A1c from 11 down to 6 since starting Ozempic - s/p IVA OS #1 (03.22.23), #2 (04.21.23), #3 (05.19.23), #4 (06.16.23), #5 (11.15.23) #6 (12.22.24) # 7 (01.26.24), #8 03.15.24) - s/p IVA OD #1 (11.15.23) #2 (12.22.23) #3 (01.26.24), #4 (03.15.24) - exam shows scattered MA, DBH and CWS OU, mild VH OD and central cystic changes OS -- improving - new VH OD noted on 11.14.23 visit -- will schedule PRP OD now that [facility] is cleared - FA (11.14.23) OD: Enlarged FAZ, scattered patches of vascular non-perfusion greatest temporal periphery, mild perivascular leakage temporal periphery and from MA, focal early NVE superonasal midzone; OS: Mild enlargement of FAZ, scattered patches of vascular non-perfusion greatest temporal periphery, mild perivascular leakage temporal periphery and from MA; no NV -- pt would benefit from PRP OD - OCT shows OD: central ellipsoid signal disruptions; persistent vitreous opacities -- improved, irregular lamination, mild scattered IRHM; OS: Trace persistent cystic changes, interval improvement in [facility] and edema SN and IT fovea - recommend PRP OD today, 04.22.24 - pt wishes to proceed with laser - RBA of procedure discussed, questions answered - IVA informed consent obtained and signed, 11.16.23 (OU) - see procedure note - f/u around Nov 02, 2022, DFE, OCT, possible injxns  4-6. Hypertensive retinopathy w/ retinal ischemia OU - BP uncontrolled at initial consult -- 170s /  110-120s in office 12.23.22, BP was significantly improved to 147/80 at f/u visit - FA 12.23.22 shows significant retinal ischemia OU -- enlarged FAZ OU (OD > OS) and significant patches of capillary drop out OU (OD>OS)  7. Mixed Cataract OU - The symptoms of cataract, surgical options, and treatments and risks were discussed with patient. - discussed diagnosis and progression - visually significant  - referred to [facility] for cat eval and establishment of primary eye care, but pt has not been there yet  Ophthalmic Meds Ordered this visit:  Meds ordered this encounter  Medications   prednisoLONE acetate (PRED FORTE) 1 % ophthalmic suspension    Sig: Place 1 drop into the right eye 4 (four) times daily for 7 days.    Dispense:  5 mL    Refill:  0     Return for f/u May 7th or later, PDR OU, DFE, OCT, possible injxns.  There are no Patient Instructions on file for this visit.  This document serves as a record of services personally performed by Karie Chimera, MD, PhD. It was created on their behalf by Glee Arvin. Manson Passey, OA an ophthalmic technician. The creation of this record is the provider's dictation and/or activities during the visit.    Electronically signed by: Glee Arvin. Manson Passey, [place] 04.22.2024 11:38 AM  Karie Chimera, M.D., Ph.D. Diseases &  Surgery of the Retina and Vitreous [facility] 04.22.2024  I have reviewed the above documentation for accuracy and completeness, and I agree with the above. Karie Chimera, M.D., Ph.D. 10/22/22 11:40 AM  Abbreviations: M myopia (nearsighted); A astigmatism; H hyperopia (farsighted); P presbyopia; Mrx spectacle prescription;  CTL contact lenses; OD right eye; OS left eye; OU both eyes  XT exotropia; ET esotropia; PEK punctate epithelial keratitis; PEE punctate epithelial erosions; DES dry eye syndrome; MGD meibomian gland dysfunction; ATs artificial tears; PFAT's preservative free artificial tears; NSC nuclear  sclerotic cataract; PSC posterior subcapsular cataract; ERM epi-retinal membrane; PVD posterior vitreous detachment; RD retinal detachment; DM diabetes mellitus; DR diabetic retinopathy; NPDR non-proliferative diabetic retinopathy; PDR proliferative diabetic retinopathy; CSME clinically significant macular edema; DME diabetic macular edema; dbh dot blot hemorrhages; CWS cotton wool spot; POAG primary open angle glaucoma; C/D cup-to-disc ratio; HVF humphrey visual field; GVF goldmann visual field; OCT optical coherence tomography; IOP intraocular pressure; BRVO Branch retinal vein occlusion; CRVO central retinal vein occlusion; CRAO central retinal artery occlusion; BRAO branch retinal artery occlusion; RT retinal tear; SB scleral buckle; PPV pars plana vitrectomy; VH Vitreous hemorrhage; PRP panretinal laser photocoagulation; IVK intravitreal kenalog; VMT vitreomacular traction; MH Macular hole;  NVD neovascularization of the disc; NVE neovascularization elsewhere; AREDS age related eye disease study; ARMD age related macular degeneration; POAG primary open angle glaucoma; EBMD epithelial/anterior basement membrane dystrophy; ACIOL anterior chamber intraocular lens; IOL intraocular lens; PCIOL posterior chamber intraocular lens; Phaco/IOL phacoemulsification with intraocular lens placement; PRK photorefractive keratectomy; LASIK laser assisted in situ keratomileusis; HTN hypertension; DM diabetes mellitus; COPD chronic obstructive pulmonary disease

## 2022-10-31 ENCOUNTER — Ambulatory Visit (HOSPITAL_COMMUNITY): Payer: No Typology Code available for payment source | Attending: Interventional Cardiology

## 2022-10-31 DIAGNOSIS — R079 Chest pain, unspecified: Secondary | ICD-10-CM | POA: Diagnosis present

## 2022-10-31 DIAGNOSIS — R0602 Shortness of breath: Secondary | ICD-10-CM

## 2022-10-31 LAB — ECHOCARDIOGRAM COMPLETE
Area-P 1/2: 4.23 cm2
P 1/2 time: 456 msec
S' Lateral: 2.7 cm

## 2022-11-06 ENCOUNTER — Encounter (INDEPENDENT_AMBULATORY_CARE_PROVIDER_SITE_OTHER): Payer: No Typology Code available for payment source | Admitting: Ophthalmology

## 2022-11-06 DIAGNOSIS — I10 Essential (primary) hypertension: Secondary | ICD-10-CM

## 2022-11-06 DIAGNOSIS — H35033 Hypertensive retinopathy, bilateral: Secondary | ICD-10-CM

## 2022-11-06 DIAGNOSIS — H3582 Retinal ischemia: Secondary | ICD-10-CM

## 2022-11-06 DIAGNOSIS — H25813 Combined forms of age-related cataract, bilateral: Secondary | ICD-10-CM

## 2022-11-06 DIAGNOSIS — H4311 Vitreous hemorrhage, right eye: Secondary | ICD-10-CM

## 2022-11-06 DIAGNOSIS — E113511 Type 2 diabetes mellitus with proliferative diabetic retinopathy with macular edema, right eye: Secondary | ICD-10-CM

## 2022-11-06 DIAGNOSIS — E113412 Type 2 diabetes mellitus with severe nonproliferative diabetic retinopathy with macular edema, left eye: Secondary | ICD-10-CM

## 2022-11-07 NOTE — Progress Notes (Signed)
[facility] - Clinic Note  11/09/2022     CHIEF COMPLAINT Patient presents for Retina Follow Up   HISTORY OF PRESENT ILLNESS: [name] is a 57 y.o. male who presents to the clinic today for:   HPI     Retina Follow Up   Patient presents with  Diabetic Retinopathy.  In both eyes.  This started weeks ago.  Duration of weeks.  I, the attending physician,  performed the HPI with the patient and updated documentation appropriately.        Comments   Patient states that since the laser his distance vision is blurry. He is not using eye drops at this time. He does not check his sugars daily.       Last edited by [name], MD on 11/09/2022  1:00 PM.      Referring physician: Clinic, [place] [address] [facility] [place],  [place] [address]  HISTORICAL INFORMATION:   Selected notes from the MEDICAL RECORD NUMBER Referred by [facility] for retinal hemes LEE:  Ocular Hx- PMH-    CURRENT MEDICATIONS: No current outpatient medications on file. (Ophthalmic Drugs)   No current facility-administered medications for this visit. (Ophthalmic Drugs)   Current Outpatient Medications (Other)  Medication Sig   AMLODIPINE BESYLATE PO Take 10 mg by mouth daily.   Cholecalciferol 25 MCG (1000 UT) capsule Take by mouth.   Docusate Sodium (DSS) 100 MG CAPS Take by mouth.   escitalopram (LEXAPRO) 10 MG tablet Take 10 mg by mouth daily.   furosemide (LASIX) 40 MG tablet Take 1 tablet (40 mg total) by mouth daily as needed.   gabapentin (NEURONTIN) 800 MG tablet Take 800 mg by mouth QID.   hydrOXYzine (VISTARIL) 25 MG capsule Take 25 mg by mouth 3 (three) times daily as needed. One tablet daily   ibuprofen (ADVIL) 800 MG tablet Take 800 mg by mouth every 8 (eight) hours as needed.   loratadine (CLARITIN) 10 MG tablet Take by mouth.   losartan (COZAAR) 100 MG tablet Take 100 mg by mouth daily.   methocarbamol (ROBAXIN) 750 MG tablet TAKE ONE TABLET BY  MOUTH TWICE A DAY AS NEEDED FOR MUSCLE SPASM   naloxone (NARCAN) nasal spray 4 mg/0.1 mL SPRAY 1 SPRAY INTO ONE NOSTRIL AS DIRECTED FOR OPIOID OVERDOSE - CALL 911 IMMEDIATELY, ADMINISTER DOSE, THEN TURN PERSON ON SIDE - IF NO RESPONSE IN 2-3 MINUTES OR PERSON RESPONDS BUT RELAPSES, REPEAT USING A NEW SPRAY DEVICE AND SPRAY INTO THE OTHER NOSTRIL   naproxen (NAPROSYN) 500 MG tablet Take 1 tablet by mouth 2 (two) times daily.   omeprazole (PRILOSEC) 40 MG capsule Take 40 mg by mouth daily.   oxyCODONE-acetaminophen (PERCOCET) 10-325 MG tablet Take 1 tablet by mouth every 4 (four) hours as needed for pain. 1 q 6 hours and 1 at bedtime   polyethylene glycol (MIRALAX / GLYCOLAX) 17 g packet Take 17 g by mouth daily.   ranitidine (ZANTAC) 75 MG tablet Take by mouth.   rosuvastatin (CRESTOR) 10 MG tablet Take 1 tablet (10 mg total) by mouth daily.   Semaglutide (OZEMPIC, 1 MG/DOSE, [place]) Inject 1 mg into the skin once a week.   sertraline (ZOLOFT) 25 MG tablet Take 25 mg by mouth daily. Per patient is taking 1/2 tablet daily   spironolactone (ALDACTONE) 25 MG tablet Take 25 mg by mouth daily.   testosterone cypionate (DEPOTESTOTERONE CYPIONATE) 100 MG/ML injection Inject 100 mg into the muscle once a week.  triamcinolone cream (KENALOG) 0.1 % APPLY THIN LAYER TO AFFECTED AREA TWICE A DAY AS NEEDED FOR ECZEMA   zolpidem (AMBIEN) 10 MG tablet 1 tablet at bedtime as needed Orally Once a day   No current facility-administered medications for this visit. (Other)   REVIEW OF SYSTEMS: ROS   Positive for: Endocrine, Eyes Negative for: Constitutional, Gastrointestinal, Neurological, Skin, Genitourinary, Musculoskeletal, HENT, Cardiovascular, Respiratory, Psychiatric, Allergic/Imm, Heme/Lymph Last edited by Julieanne Cotton, COT on 11/09/2022  8:20 AM.       ALLERGIES Allergies  Allergen Reactions   Lisinopril Other (See Comments) and Hives    Other reaction(s): Electrocardiogram abnormal  Other  Reaction(s): Electrocardiogram abnormal, Abdominal pain, Electrocardiogram abnormal, Abdominal pain   Sildenafil Nausea And Vomiting   PAST MEDICAL HISTORY Past Medical History:  Diagnosis Date   Arthritis    Diabetes mellitus    Fistula, perirectal    GERD (gastroesophageal reflux disease)    Hypertension    Osteomyelitis of great toe of right foot (HCC) 10/31/2021   Polymicrobial bacterial infection 10/31/2021   PTSD (post-traumatic stress disorder)    PTSD (post-traumatic stress disorder) 10/31/2021   Small bowel obstruction (HCC)    Past Surgical History:  Procedure Laterality Date   RECTAL SURGERY     FAMILY HISTORY Family History  Problem Relation Age of Onset   Heart attack Father    Obesity Father     SOCIAL HISTORY Social History   Tobacco Use   Smoking status: Some Days    Types: Cigars   Smokeless tobacco: Never   Tobacco comments:    Occasional cigar  Vaping Use   Vaping Use: Never used  Substance Use Topics   Alcohol use: Yes    Comment: occ   Drug use: No       OPHTHALMIC EXAM:  Base Eye Exam     Visual Acuity (Snellen - Linear)       Right Left   Dist cc 20/400 20/25   Dist ph cc NI NI    Correction: Glasses         Tonometry (Tonopen, 8:28 AM)       Right Left   Pressure 16 16         Pupils       Dark Light Shape React APD   Right 2 2 Round Brisk None   Left 2 2 Round Brisk None         Visual Fields       Left Right    Full Full         Extraocular Movement       Right Left    Full, Ortho Full, Ortho         Neuro/Psych     Oriented x3: Yes   Mood/Affect: Normal         Dilation     Both eyes: 1.0% Mydriacyl, 2.5% Phenylephrine @ 8:22 AM           Slit Lamp and Fundus Exam     Slit Lamp Exam       Right Left   Lids/Lashes Dermatochalasis - upper lid Dermatochalasis - upper lid   Conjunctiva/Sclera mild melanosis, nasal pingeucula mild melanosis, nasal and temporal pinguecula   Cornea  arcus arcus, 2+ PEE, mild tear film debris   Anterior Chamber deep, clear, narrow temporal angle Deep and quiet   Iris Round and dilated, No NVI Round and dilated, No NVI   Lens 3+ Nuclear sclerosis with brunescence,  2-3+ Cortical cataract 3+ Nuclear sclerosis with brunescence, 3+ Cortical cataract   Anterior Vitreous Vitreous syneresis, blood stained vitreous condensations - improved Vitreous syneresis         Fundus Exam       Right Left   Disc Pink and Sharp, no NVD, Compact Pink and Sharp, no NVD   C/D Ratio 0.2 0.2   Macula Flat, good foveal reflex, scattered MA/DBH, CWS SN / IT mac - improving Blunted foveal reflex, central edema with cluster of DBH temporal fovea - improved, scattered DBH and CWS   Vessels attenuated, mild tortuosity attenuated, Tortuous, mild Copper wiring   Periphery Attached, scattered DBH and CWS posteriorly Attached, scattered DBH and CWS posteriorly           Refraction     Wearing Rx       Sphere Cylinder Axis Add   Right +2.50 +0.50 005 +2.50   Left +2.75 +0.75 009 +2.50         Manifest Refraction   Unable to improve with refraction           IMAGING AND PROCEDURES  Imaging and Procedures for 11/09/2022  OCT, Retina - OU - Both Eyes       Right Eye Quality was borderline. Central Foveal Thickness: 232. Progression has been stable. Findings include normal foveal contour, no IRF, no SRF, outer retinal atrophy, vitreomacular adhesion (central ellipsoid signal disruptions and thinning; vitreous opacities -- stably improved; irregular lamination, mild scattered IRHM).   Left Eye Quality was borderline. Central Foveal Thickness: 293. Progression has improved. Findings include no IRF, no SRF, abnormal foveal contour, intraretinal hyper-reflective material, intraretinal fluid (Trace cystic changes--improved, interval improvement in IRHM and edema SN and IT fovea).   Notes *Images captured and stored on drive  Diagnosis / Impression:   OD: central ellipsoid signal disruptions; persistent vitreous opacities -- improved; irregular lamination, mild scattered IRHM OS: Trace cystic changes--improved, interval improvement in IRHM and edema SN and IT fovea  Clinical management:  See below  Abbreviations: NFP - Normal foveal profile. CME - cystoid macular edema. PED - pigment epithelial detachment. IRF - intraretinal fluid. SRF - subretinal fluid. EZ - ellipsoid zone. ERM - epiretinal membrane. ORA - outer retinal atrophy. ORT - outer retinal tubulation. SRHM - subretinal hyper-reflective material. IRHM - intraretinal hyper-reflective material      Intravitreal Injection, Pharmacologic Agent - OD - Right Eye       Time Out 11/09/2022. 9:14 AM. Confirmed correct patient, procedure, site, and patient consented.   Anesthesia Topical anesthesia was used. Anesthetic medications included Lidocaine 2%, Proparacaine 0.5%.   Procedure Preparation included 5% betadine to ocular surface, eyelid speculum. A supplied (32g) needle was used.   Injection: 1.25 mg Bevacizumab 1.25mg /0.11ml   Route: Intravitreal, Site: Right Eye   NDC: P3213405, Lot: [id number], Expiration date: 12/24/2022   Post-op Post injection exam found visual acuity of at least counting fingers. The patient tolerated the procedure well. There were no complications. The patient received written and verbal post procedure care education.      Intravitreal Injection, Pharmacologic Agent - OS - Left Eye       Time Out 11/09/2022. 9:15 AM. Confirmed correct patient, procedure, site, and patient consented.   Anesthesia Topical anesthesia was used. Anesthetic medications included Lidocaine 2%, Proparacaine 0.5%.   Procedure Preparation included 5% betadine to ocular surface, eyelid speculum. A (33g) needle was used.   Injection: 1.25 mg Bevacizumab 1.25mg /0.13ml   Route: Intravitreal, Site: Left Eye  NDC: P3213405, Lot: [id number], Expiration date: 01/24/2023    Post-op Post injection exam found visual acuity of at least counting fingers. The patient tolerated the procedure well. There were no complications. The patient received written and verbal post procedure care education. Post injection medications were not given.            ASSESSMENT/PLAN:    ICD-10-CM   1. 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 Vitreous hemorrhage of right eye (HCC)  H43.11     3. 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 Long-term (current) use of injectable non-insulin antidiabetic drugs  Z79.85     5. Essential hypertension  I10     6. Hypertensive retinopathy of both eyes  H35.033     7. Retinal ischemia  H35.82     8. Combined forms of age-related cataract of both eyes  H25.813       1-4.  Proliferative diabetic retinopathy OD, Severe non-proliferative diabetic retinopathy, OS  - pt lost to follow up from 06.16.23 to 11.14.23 - pt reports recent improvement in A1c from 11 down to 6 since starting Ozempic - s/p IVA OS #1 (03.22.23), #2 (04.21.23), #3 (05.19.23), #4 (06.16.23), #5 (11.15.23) #6 (12.22.24) # 7 (01.26.24), #8 03.15.24) - s/p IVA OD #1 (11.15.23) #2 (12.22.23) #3 (01.26.24), #4 (03.15.24)  - s/p PRP OD (04.22.24) - exam shows scattered MA, DBH and CWS OU, mild VH OD and central cystic changes OS -- improving - new VH OD noted on 11.14.23 visit  - FA (11.14.23) OD: Enlarged FAZ, scattered patches of vascular non-perfusion greatest temporal periphery, mild perivascular leakage temporal periphery and from MA, focal early NVE  superonasal midzone; OS: Mild enlargement of FAZ, scattered patches of vascular non-perfusion greatest temporal periphery, mild perivascular leakage temporal periphery and from MA; no NV -- pt would benefit from PRP OD -- completed 04.22.24 - OCT shows OD: central ellipsoid signal disruptions and thinning; vitreous opacities --  stably improved; irregular lamination, mild scattered IRHM; OS: Trace cystic changes--improved, interval improvement in IRHM and edema SN and IT fovea at 8 wks since last injxn - recommend IVA OU (OD #5 and OS #9) today, 05.10.24 w/ f/u in 8 wks - pt wishes to proceed with injections - RBA of procedure discussed, questions answered - IVA informed consent obtained and signed, 11.16.23 (OU) - see procedure note - f/u 8 weeks - DFE, OCT, possible injxns  5-7. Hypertensive retinopathy w/ retinal ischemia OU - BP uncontrolled at initial consult -- 170s / 110-120s in office 12.23.22, BP was significantly improved to 147/80 at f/u visit - FA 12.23.22 shows significant retinal ischemia OU -- enlarged FAZ OU (OD> OS) and significant patches of capillary drop out OU (OD>OS)  8. Mixed Cataract OU - The symptoms of cataract, surgical options, and treatments and risks were discussed with patient. - discussed diagnosis and progression - visually significant  - will refer to [facility] / Dr. Sherrine Maples for cat eval and establishment of primary eye care  Ophthalmic Meds Ordered this visit:  Meds  ordered this encounter  Medications   Bevacizumab (AVASTIN) SOLN 1.25 mg   Bevacizumab (AVASTIN) SOLN 1.25 mg     Return in about 8 weeks (around 01/04/2023) for f/u PDR OU, DFE, OCT, Possible, IVA, OU.  There are no Patient Instructions on file for this visit.  This document serves as a record of services personally performed by Karie Chimera, MD, PhD. It was created on their behalf by Berlin Hun COT, an ophthalmic technician. The creation of this record is the provider's dictation  and/or activities during the visit.    Electronically signed by: Berlin Hun COT 05/08/20241:01 PM  This document serves as a record of services personally performed by Karie Chimera, MD, PhD. It was created on their behalf by Gerilyn Nestle, COT an ophthalmic technician. The creation of this record is the provider's dictation and/or activities during the visit.     Electronically signed by:  Gerilyn Nestle, COT  05.10.24 1:01 PM  Karie Chimera, M.D., Ph.D. Diseases & Surgery of the Retina and Vitreous [facility] 11/09/2022   I have reviewed the above documentation for accuracy and completeness, and I agree with the above. Karie Chimera, M.D., Ph.D. 11/09/22 1:03 PM   Abbreviations: M myopia (nearsighted); A astigmatism; H hyperopia (farsighted); P presbyopia; Mrx spectacle prescription;  CTL contact lenses; OD right eye; OS left eye; OU both eyes  XT exotropia; ET esotropia; PEK punctate epithelial keratitis; PEE punctate epithelial erosions; DES dry eye syndrome; MGD meibomian gland dysfunction; ATs artificial tears; PFAT's preservative free artificial tears; NSC nuclear sclerotic cataract; PSC posterior subcapsular cataract; ERM epi-retinal membrane; PVD posterior vitreous detachment; RD retinal detachment; DM diabetes mellitus; DR diabetic retinopathy; NPDR non-proliferative diabetic retinopathy; PDR proliferative diabetic retinopathy; CSME clinically significant macular edema; DME diabetic macular edema; dbh dot blot hemorrhages; CWS cotton wool spot; POAG primary open angle glaucoma; C/D cup-to-disc ratio; HVF humphrey visual field; GVF goldmann visual field; OCT optical coherence tomography; IOP intraocular pressure; BRVO Branch retinal vein occlusion; CRVO central retinal vein occlusion; CRAO central retinal artery occlusion; BRAO branch retinal artery occlusion; RT retinal tear; SB scleral buckle; PPV pars plana vitrectomy; VH Vitreous  hemorrhage; PRP panretinal laser photocoagulation; IVK intravitreal kenalog; VMT vitreomacular traction; MH Macular hole;  NVD neovascularization of the disc; NVE neovascularization elsewhere; AREDS age related eye disease study; ARMD age related macular degeneration; POAG primary open angle glaucoma; EBMD epithelial/anterior basement membrane dystrophy; ACIOL anterior chamber intraocular lens; IOL intraocular lens; PCIOL posterior chamber intraocular lens; Phaco/IOL phacoemulsification with intraocular lens placement; PRK photorefractive keratectomy; LASIK laser assisted in situ keratomileusis; HTN hypertension; DM diabetes mellitus; COPD chronic obstructive pulmonary disease

## 2022-11-09 ENCOUNTER — Encounter (INDEPENDENT_AMBULATORY_CARE_PROVIDER_SITE_OTHER): Payer: Self-pay | Admitting: Ophthalmology

## 2022-11-09 ENCOUNTER — Ambulatory Visit (INDEPENDENT_AMBULATORY_CARE_PROVIDER_SITE_OTHER): Payer: No Typology Code available for payment source | Admitting: Ophthalmology

## 2022-11-09 DIAGNOSIS — H4311 Vitreous hemorrhage, right eye: Secondary | ICD-10-CM

## 2022-11-09 DIAGNOSIS — E113412 Type 2 diabetes mellitus with severe nonproliferative diabetic retinopathy with macular edema, left eye: Secondary | ICD-10-CM | POA: Diagnosis not present

## 2022-11-09 DIAGNOSIS — E113511 Type 2 diabetes mellitus with proliferative diabetic retinopathy with macular edema, right eye: Secondary | ICD-10-CM

## 2022-11-09 DIAGNOSIS — I10 Essential (primary) hypertension: Secondary | ICD-10-CM

## 2022-11-09 DIAGNOSIS — Z7985 Long-term (current) use of injectable non-insulin antidiabetic drugs: Secondary | ICD-10-CM | POA: Diagnosis not present

## 2022-11-09 DIAGNOSIS — H25813 Combined forms of age-related cataract, bilateral: Secondary | ICD-10-CM

## 2022-11-09 DIAGNOSIS — H35033 Hypertensive retinopathy, bilateral: Secondary | ICD-10-CM

## 2022-11-09 DIAGNOSIS — H3582 Retinal ischemia: Secondary | ICD-10-CM

## 2022-11-09 MED ORDER — BEVACIZUMAB CHEMO INJECTION 1.25MG/0.05ML SYRINGE FOR KALEIDOSCOPE
1.2500 mg | INTRAVITREAL | Status: AC | PRN
  Administered 2022-11-09: 1.25 mg via INTRAVITREAL

## 2022-11-14 ENCOUNTER — Telehealth: Payer: Self-pay | Admitting: Interventional Cardiology

## 2022-11-14 ENCOUNTER — Ambulatory Visit (INDEPENDENT_AMBULATORY_CARE_PROVIDER_SITE_OTHER): Payer: No Typology Code available for payment source | Admitting: Podiatry

## 2022-11-14 DIAGNOSIS — L600 Ingrowing nail: Secondary | ICD-10-CM

## 2022-11-14 DIAGNOSIS — Z01818 Encounter for other preprocedural examination: Secondary | ICD-10-CM

## 2022-11-14 NOTE — Telephone Encounter (Signed)
Pt advised his Echo results and verbalized understanding... he denies any further chest tightness and understands the need for his lasix and how to be taking it... he will follow up with Dr Eldridge Dace 01/2023 but will call if anything changes.

## 2022-11-14 NOTE — Telephone Encounter (Signed)
Patient was calling back to get results. Please advise

## 2022-11-14 NOTE — Progress Notes (Signed)
Subjective:  Patient ID: [name], male    DOB: 09-17-1965,  MRN: [id number]  Chief Complaint  Patient presents with   Toe Pain    57 y.o. male presents with the above complaint.  Patient presents with thickened elongated dystrophic ingrown nails x 9.  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  He states he tried everything for him he is tired of the care and he is a diabetic with A1c at 8% he would like to have all of the nails removed and made permanent.  He has tried nail debridement which has helped give him relief.   Review of Systems: Negative except as noted in the HPI. Denies N/V/F/Ch.  Past Medical History:  Diagnosis Date   Arthritis    Diabetes mellitus    Fistula, perirectal    GERD (gastroesophageal reflux disease)    Hypertension    Osteomyelitis of great toe of right foot (HCC) 10/31/2021   Polymicrobial bacterial infection 10/31/2021   PTSD (post-traumatic stress disorder)    PTSD (post-traumatic stress disorder) 10/31/2021   Small bowel obstruction (HCC)     Current Outpatient Medications:    AMLODIPINE BESYLATE PO, Take 10 mg by mouth daily., Disp: , Rfl:    Cholecalciferol 25 MCG (1000 UT) capsule, Take by mouth., Disp: , Rfl:    Docusate Sodium (DSS) 100 MG CAPS, Take by mouth., Disp: , Rfl:    escitalopram (LEXAPRO) 10 MG tablet, Take 10 mg by mouth daily., Disp: , Rfl:    furosemide (LASIX) 40 MG tablet, Take 1 tablet (40 mg total) by mouth daily as needed., Disp: 90 tablet, Rfl: 3   gabapentin (NEURONTIN) 800 MG tablet, Take 800 mg by mouth QID., Disp: , Rfl:    hydrOXYzine (VISTARIL) 25 MG capsule, Take 25 mg by mouth 3 (three) times daily as needed. One tablet daily, Disp: , Rfl:    ibuprofen (ADVIL) 800 MG tablet, Take 800 mg by mouth every 8 (eight) hours as needed., Disp: , Rfl:    loratadine (CLARITIN) 10 MG tablet, Take by mouth., Disp: , Rfl:    losartan (COZAAR) 100 MG tablet, Take 100 mg by mouth daily., Disp: , Rfl:     methocarbamol (ROBAXIN) 750 MG tablet, TAKE ONE TABLET BY MOUTH TWICE A DAY AS NEEDED FOR MUSCLE SPASM, Disp: , Rfl:    naloxone (NARCAN) nasal spray 4 mg/0.1 mL, SPRAY 1 SPRAY INTO ONE NOSTRIL AS DIRECTED FOR OPIOID OVERDOSE - CALL 911 IMMEDIATELY, ADMINISTER DOSE, THEN TURN PERSON ON SIDE - IF NO RESPONSE IN 2-3 MINUTES OR PERSON RESPONDS BUT RELAPSES, REPEAT USING A NEW SPRAY DEVICE AND SPRAY INTO THE OTHER NOSTRIL, Disp: , Rfl:    naproxen (NAPROSYN) 500 MG tablet, Take 1 tablet by mouth 2 (two) times daily., Disp: , Rfl:    omeprazole (PRILOSEC) 40 MG capsule, Take 40 mg by mouth daily., Disp: , Rfl:    oxyCODONE-acetaminophen (PERCOCET) 10-325 MG tablet, Take 1 tablet by mouth every 4 (four) hours as needed for pain. 1 q 6 hours and 1 at bedtime, Disp: , Rfl:    polyethylene glycol (MIRALAX / GLYCOLAX) 17 g packet, Take 17 g by mouth daily., Disp: 14 each, Rfl: 1   ranitidine (ZANTAC) 75 MG tablet, Take by mouth., Disp: , Rfl:    rosuvastatin (CRESTOR) 10 MG tablet, Take 1 tablet (10 mg total) by mouth daily., Disp: 90 tablet, Rfl: 3   Semaglutide (OZEMPIC, 1 MG/DOSE, [place]), Inject 1 mg into  the skin once a week., Disp: , Rfl:    sertraline (ZOLOFT) 25 MG tablet, Take 25 mg by mouth daily. Per patient is taking 1/2 tablet daily, Disp: , Rfl:    spironolactone (ALDACTONE) 25 MG tablet, Take 25 mg by mouth daily., Disp: , Rfl:    testosterone cypionate (DEPOTESTOTERONE CYPIONATE) 100 MG/ML injection, Inject 100 mg into the muscle once a week., Disp: , Rfl:    triamcinolone cream (KENALOG) 0.1 %, APPLY THIN LAYER TO AFFECTED AREA TWICE A DAY AS NEEDED FOR ECZEMA, Disp: , Rfl:    zolpidem (AMBIEN) 10 MG tablet, 1 tablet at bedtime as needed Orally Once a day, Disp: , Rfl:   Social History   Tobacco Use  Smoking Status Some Days   Types: Cigars  Smokeless Tobacco Never  Tobacco Comments   Occasional cigar    Allergies  Allergen Reactions   Lisinopril Other (See Comments) and Hives     Other reaction(s): Electrocardiogram abnormal  Other Reaction(s): Electrocardiogram abnormal, Abdominal pain, Electrocardiogram abnormal, Abdominal pain   Sildenafil Nausea And Vomiting   Objective:  There were no vitals filed for this visit. There is no height or weight on file to calculate BMI. Constitutional Well developed. Well nourished.  Vascular Dorsalis pedis pulses palpable bilaterally. Posterior tibial pulses palpable bilaterally. Capillary refill normal to all digits.  No cyanosis or clubbing noted. Pedal hair growth normal.  Neurologic Normal speech. Oriented to person, place, and time. Epicritic sensation to light touch grossly present bilaterally.  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  Pain on palpation to the nails Skin within normal limits  Orthopedic: Normal joint ROM without pain or crepitus bilaterally. No visible deformities. No bony tenderness.   Radiographs: None Assessment:   1. Ingrown toenail of right foot   2. Ingrown left big toenail   3. Encounter for preoperative examination for general surgical procedure    Plan:  Patient was evaluated and treated and all questions answered.  Ingrown nails x 9 except right hallux -All questions and concerns were discussed with the patient extensive detail -Given that he is a diabetic with maximal collapse of 8% I discussed with the patient that he could end up losing the toes if it does not heal properly.  He states understanding like to proceed despite the risks associated with the surgery.  I encouraged glucose control in extensive detail he states understanding -I discussed the patient will benefit from total nail avulsion with phenol matricectomy of 9 nails due to painful ingrown's. -I discussed my preoperative intra postop plan with the patient in extensive detail he states that she will like to proceed with  surgery No follow-ups on file.  Removal of all nails x 9 history of partial partial amputation to the right side  Permanent  Diabetic A1c of 8%

## 2022-12-07 ENCOUNTER — Ambulatory Visit: Payer: No Typology Code available for payment source | Admitting: Podiatry

## 2022-12-26 ENCOUNTER — Ambulatory Visit: Payer: Medicare PPO | Attending: Interventional Cardiology

## 2022-12-26 NOTE — Progress Notes (Signed)
[facility] - Clinic Note  12/28/2022     CHIEF COMPLAINT Patient presents for Retina Follow Up   HISTORY OF PRESENT ILLNESS: [name] is a 57 y.o. male who presents to the clinic today for:   HPI     Retina Follow Up   Patient presents with  Diabetic Retinopathy.  In both eyes.  Severity is moderate.  Duration of 8 weeks.  Since onset it is stable.  I, the attending physician,  performed the HPI with the patient and updated documentation appropriately.        Comments   Pt here for 8 wk ret f/u PDR OU. Pt states VA is the same, no changes.       Last edited by [name], MD on 12/28/2022 11:51 AM.    Pt states right eye vision has decreased, he states it is very blurry when he covers his left eye   Referring physician: Clinic, [facility] [address] [facility] [place],  [place] [address]  HISTORICAL INFORMATION:   Selected notes from the MEDICAL RECORD NUMBER Referred by [facility] for retinal hemes LEE:  Ocular Hx- PMH-    CURRENT MEDICATIONS: No current outpatient medications on file. (Ophthalmic Drugs)   No current facility-administered medications for this visit. (Ophthalmic Drugs)   Current Outpatient Medications (Other)  Medication Sig   AMLODIPINE BESYLATE PO Take 10 mg by mouth daily.   Cholecalciferol 25 MCG (1000 UT) capsule Take by mouth.   Docusate Sodium (DSS) 100 MG CAPS Take by mouth.   escitalopram (LEXAPRO) 10 MG tablet Take 10 mg by mouth daily.   furosemide (LASIX) 40 MG tablet Take 1 tablet (40 mg total) by mouth daily as needed.   gabapentin (NEURONTIN) 800 MG tablet Take 800 mg by mouth QID.   hydrOXYzine (VISTARIL) 25 MG capsule Take 25 mg by mouth 3 (three) times daily as needed. One tablet daily   ibuprofen (ADVIL) 800 MG tablet Take 800 mg by mouth every 8 (eight) hours as needed.   loratadine (CLARITIN) 10 MG tablet Take by mouth.   losartan (COZAAR) 100 MG tablet Take 100 mg by mouth daily.    methocarbamol (ROBAXIN) 750 MG tablet TAKE ONE TABLET BY MOUTH TWICE A DAY AS NEEDED FOR MUSCLE SPASM   naloxone (NARCAN) nasal spray 4 mg/0.1 mL SPRAY 1 SPRAY INTO ONE NOSTRIL AS DIRECTED FOR OPIOID OVERDOSE - CALL 911 IMMEDIATELY, ADMINISTER DOSE, THEN TURN PERSON ON SIDE - IF NO RESPONSE IN 2-3 MINUTES OR PERSON RESPONDS BUT RELAPSES, REPEAT USING A NEW SPRAY DEVICE AND SPRAY INTO THE OTHER NOSTRIL   naproxen (NAPROSYN) 500 MG tablet Take 1 tablet by mouth 2 (two) times daily.   omeprazole (PRILOSEC) 40 MG capsule Take 40 mg by mouth daily.   oxyCODONE-acetaminophen (PERCOCET) 10-325 MG tablet Take 1 tablet by mouth every 4 (four) hours as needed for pain. 1 q 6 hours and 1 at bedtime   polyethylene glycol (MIRALAX / GLYCOLAX) 17 g packet Take 17 g by mouth daily.   ranitidine (ZANTAC) 75 MG tablet Take by mouth.   rosuvastatin (CRESTOR) 10 MG tablet Take 1 tablet (10 mg total) by mouth daily.   Semaglutide (OZEMPIC, 1 MG/DOSE, Armstrong) Inject 1 mg into the skin once a week.   sertraline (ZOLOFT) 25 MG tablet Take 25 mg by mouth daily. Per patient is taking 1/2 tablet daily   spironolactone (ALDACTONE) 25 MG tablet Take 25 mg by mouth daily.   testosterone cypionate (DEPOTESTOTERONE  CYPIONATE) 100 MG/ML injection Inject 100 mg into the muscle once a week.   triamcinolone cream (KENALOG) 0.1 % APPLY THIN LAYER TO AFFECTED AREA TWICE A DAY AS NEEDED FOR ECZEMA   zolpidem (AMBIEN) 10 MG tablet 1 tablet at bedtime as needed Orally Once a day   No current facility-administered medications for this visit. (Other)   REVIEW OF SYSTEMS: ROS   Positive for: Endocrine, Eyes Negative for: Constitutional, Gastrointestinal, Neurological, Skin, Genitourinary, Musculoskeletal, HENT, Cardiovascular, Respiratory, Psychiatric, Allergic/Imm, Heme/Lymph Last edited by Thompson Grayer, COT on 12/28/2022  7:53 AM.     ALLERGIES Allergies  Allergen Reactions   Lisinopril Other (See Comments) and Hives    Other  reaction(s): Electrocardiogram abnormal  Other Reaction(s): Electrocardiogram abnormal, Abdominal pain, Electrocardiogram abnormal, Abdominal pain   Sildenafil Nausea And Vomiting   PAST MEDICAL HISTORY Past Medical History:  Diagnosis Date   Arthritis    Diabetes mellitus    Fistula, perirectal    GERD (gastroesophageal reflux disease)    Hypertension    Osteomyelitis of great toe of right foot (HCC) 10/31/2021   Polymicrobial bacterial infection 10/31/2021   PTSD (post-traumatic stress disorder)    PTSD (post-traumatic stress disorder) 10/31/2021   Small bowel obstruction (HCC)    Past Surgical History:  Procedure Laterality Date   RECTAL SURGERY     FAMILY HISTORY Family History  Problem Relation Age of Onset   Heart attack Father    Obesity Father     SOCIAL HISTORY Social History   Tobacco Use   Smoking status: Some Days    Types: Cigars   Smokeless tobacco: Never   Tobacco comments:    Occasional cigar  Vaping Use   Vaping Use: Never used  Substance Use Topics   Alcohol use: Yes    Comment: occ   Drug use: No       OPHTHALMIC EXAM:  Base Eye Exam     Visual Acuity (Snellen - Linear)       Right Left   Dist cc CF at 3' 20/25   Dist ph cc NI NI         Tonometry (Tonopen, 7:58 AM)       Right Left   Pressure 17 15         Pupils       Pupils Dark Light Shape React APD   Right PERRL 2 2 Round NR None   Left PERRL 2 2 Round NR None         Visual Fields (Counting fingers)       Left Right    Full Full         Extraocular Movement       Right Left    Full, Ortho Full, Ortho         Neuro/Psych     Oriented x3: Yes   Mood/Affect: Normal         Dilation     Both eyes: 1.0% Mydriacyl, 2.5% Phenylephrine @ 7:59 AM           Slit Lamp and Fundus Exam     Slit Lamp Exam       Right Left   Lids/Lashes Dermatochalasis - upper lid Dermatochalasis - upper lid   Conjunctiva/Sclera mild melanosis, nasal pingeucula  mild melanosis, nasal and temporal pinguecula   Cornea arcus, 2+ fine Punctate epithelial erosions, tear film debris arcus, 1+2+ fine PEE, mild tear film debris   Anterior Chamber deep, clear, narrow temporal angle  Deep and quiet   Iris Round and dilated, No NVI Round and dilated, No NVI   Lens 3+ Nuclear sclerosis with brunescence, 2-3+ Cortical cataract 3+ Nuclear sclerosis with brunescence, 3+ Cortical cataract   Anterior Vitreous Vitreous syneresis, blood stained vitreous condensations - improved Vitreous syneresis         Fundus Exam       Right Left   Disc Pink and Sharp, no NVD, Compact Pink and Sharp, no NVD, mild PPA   C/D Ratio 0.2 0.2   Macula Flat, good foveal reflex, RPE mottling and clumping, minimal heme, no edema, CWS SN / IT mac - improving Blunted foveal reflex, central edema with cluster of DBH temporal fovea - improved, scattered DBH and CWS   Vessels attenuated, copper wiring, Tortuous attenuated, Tortuous, mild Copper wiring   Periphery Attached, scattered DBH and CWS posteriorly Attached, scattered DBH and CWS posteriorly           Refraction     Wearing Rx       Sphere Cylinder Axis Add   Right +2.50 +0.50 005 +2.50   Left +2.75 +0.75 009 +2.50            IMAGING AND PROCEDURES  Imaging and Procedures for 12/28/2022  OCT, Retina - OU - Both Eyes       Right Eye Quality was good. Central Foveal Thickness: 227. Progression has been stable. Findings include normal foveal contour, no IRF, no SRF, outer retinal atrophy, vitreomacular adhesion (central ellipsoid signal disruptions and thinning; vitreous opacities -- slightly improved; irregular lamination, mild scattered IRHM).   Left Eye Quality was good. Central Foveal Thickness: 273. Progression has improved. Findings include normal foveal contour, no SRF, intraretinal hyper-reflective material, intraretinal fluid (Trace cystic changes temporal macula -- persistent).   Notes *Images captured and  stored on drive  Diagnosis / Impression:  OD: central ellipsoid signal disruptions; persistent vitreous opacities -- improved; irregular lamination, mild scattered IRHM OS: Trace cystic changes temporal macula -- persistent  Clinical management:  See below  Abbreviations: NFP - Normal foveal profile. CME - cystoid macular edema. PED - pigment epithelial detachment. IRF - intraretinal fluid. SRF - subretinal fluid. EZ - ellipsoid zone. ERM - epiretinal membrane. ORA - outer retinal atrophy. ORT - outer retinal tubulation. SRHM - subretinal hyper-reflective material. IRHM - intraretinal hyper-reflective material      Intravitreal Injection, Pharmacologic Agent - OD - Right Eye       Time Out 12/28/2022. 9:08 AM. Confirmed correct patient, procedure, site, and patient consented.   Anesthesia Topical anesthesia was used. Anesthetic medications included Lidocaine 2%, Proparacaine 0.5%.   Procedure Preparation included 5% betadine to ocular surface, eyelid speculum. A supplied (32g) needle was used.   Injection: 1.25 mg Bevacizumab 1.25mg /0.22ml   Route: Intravitreal, Site: Right Eye   NDC: P3213405, Lot: [id number], Expiration date: 02/11/2023   Post-op Post injection exam found visual acuity of at least counting fingers. The patient tolerated the procedure well. There were no complications. The patient received written and verbal post procedure care education.      Intravitreal Injection, Pharmacologic Agent - OS - Left Eye       Time Out 12/28/2022. 9:08 AM. Confirmed correct patient, procedure, site, and patient consented.   Anesthesia Topical anesthesia was used. Anesthetic medications included Lidocaine 2%, Proparacaine 0.5%.   Procedure Preparation included 5% betadine to ocular surface, eyelid speculum. A (33g) needle was used.   Injection: 1.25 mg Bevacizumab 1.25mg /0.33ml   Route: Intravitreal,  Site: Left Eye   NDC: P3213405, Lot: [id number], Expiration date:  03/29/2023   Post-op Post injection exam found visual acuity of at least counting fingers. The patient tolerated the procedure well. There were no complications. The patient received written and verbal post procedure care education. Post injection medications were not given.            ASSESSMENT/PLAN:    ICD-10-CM   1. 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 Vitreous hemorrhage of right eye (HCC)  H43.11     3. 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 Long-term (current) use of injectable non-insulin antidiabetic drugs  Z79.85     5. Essential hypertension  I10     6. Hypertensive retinopathy of both eyes  H35.033     7. Retinal ischemia  H35.82     8. Combined forms of age-related cataract of both eyes  H25.813      1-4.  Proliferative diabetic retinopathy OD, Severe non-proliferative diabetic retinopathy, OS  - pt lost to follow up from 06.16.23 to 11.14.23 - pt reports recent improvement in A1c from 11 down to 6 since starting Ozempic - s/p IVA OS #1 (03.22.23), #2 (04.21.23), #3 (05.19.23), #4 (06.16.23), #5 (11.15.23), #6 (12.22.24), # 7 (01.26.24), #8 (03.15.24), #9 (05.10.24) - s/p IVA OD #1 (11.15.23), #2 (12.22.23), #3 (01.26.24), #4 (03.15.24), #5 (05.10.24) - s/p PRP OD (04.22.24) - exam shows scattered MA, DBH and CWS OU, mild VH OD and central cystic changes OS -- improving - new VH OD noted on 11.14.23 visit  - FA (11.14.23) OD: Enlarged FAZ, scattered patches of vascular non-perfusion greatest temporal periphery, mild perivascular leakage temporal  periphery and from MA, focal early NVE superonasal midzone; OS: Mild enlargement of FAZ, scattered patches of vascular non-perfusion greatest temporal periphery, mild perivascular leakage temporal periphery and from MA; no NV -- pt would benefit from PRP OD -- completed 04.22.24 - BCVA OD CF3' (severe retinal ischemia); OS 20/25 -- stable - OCT shows OD: central ellipsoid signal disruptions and thinning; vitreous opacities --  stably improved; irregular lamination, mild scattered IRHM; OS: Trace cystic changes temporal fovea -- persistent at 7 weeks - recommend IVA OU (OD #6 and OS #10) today, 06.28.24 w/ f/u in 7 wks again - pt wishes to proceed with injections - RBA of procedure discussed, questions answered - IVA informed consent obtained and signed, 11.16.23 (OU) - see procedure note - f/u 7 weeks - DFE, OCT, possible injxns  5-7. Hypertensive retinopathy w/ retinal ischemia OU - BP uncontrolled at initial consult -- 170s / 110-120s in office 12.23.22, BP was significantly improved to 147/80 at f/u visit - FA 12.23.22 shows significant retinal ischemia OU -- enlarged FAZ OU (OD> OS) and significant patches of capillary drop out OU (OD>OS)  8. Mixed Cataract OU - The symptoms of cataract, surgical options, and treatments and risks were discussed with patient. - discussed diagnosis and progression - visually significant  - referred to [facility] / Dr. Sherrine Maples for cat eval and establishment of primary eye care  Ophthalmic Meds  Ordered this visit:  Meds ordered this encounter  Medications   Bevacizumab (AVASTIN) SOLN 1.25 mg   Bevacizumab (AVASTIN) SOLN 1.25 mg     Return in about 7 weeks (around 02/15/2023) for f/u PDR OU, DFE, OCT.  There are no Patient Instructions on file for this visit. This document serves as a record of services personally performed by Karie Chimera, MD, PhD. It was created on their behalf by Berlin Hun COT, an ophthalmic technician. The creation of this  record is the provider's dictation and/or activities during the visit.    Electronically signed by: Berlin Hun COT 06.26.2024 11:53 AM  This document serves as a record of services personally performed by Karie Chimera, MD, PhD. It was created on their behalf by Glee Arvin. Manson Passey, OA an ophthalmic technician. The creation of this record is the provider's dictation and/or activities during the visit.    Electronically signed by: Glee Arvin. Manson Passey, OA 12/28/22 11:53 AM  Karie Chimera, M.D., Ph.D. Diseases & Surgery of the Retina and Vitreous [facility] 12/28/2022   I have reviewed the above documentation for accuracy and completeness, and I agree with the above. Karie Chimera, M.D., Ph.D. 12/28/22 11:55 AM   Abbreviations: M myopia (nearsighted); A astigmatism; H hyperopia (farsighted); P presbyopia; Mrx spectacle prescription;  CTL contact lenses; OD right eye; OS left eye; OU both eyes  XT exotropia; ET esotropia; PEK punctate epithelial keratitis; PEE punctate epithelial erosions; DES dry eye syndrome; MGD meibomian gland dysfunction; ATs artificial tears; PFAT's preservative free artificial tears; NSC nuclear sclerotic cataract; PSC posterior subcapsular cataract; ERM epi-retinal membrane; PVD posterior vitreous detachment; RD retinal detachment; DM diabetes mellitus; DR diabetic retinopathy; NPDR non-proliferative diabetic retinopathy; PDR proliferative diabetic retinopathy; CSME clinically significant macular edema; DME diabetic macular edema; dbh dot blot hemorrhages; CWS cotton wool spot; POAG primary open angle glaucoma; C/D cup-to-disc ratio; HVF humphrey visual field; GVF goldmann visual field; OCT optical coherence tomography; IOP intraocular pressure; BRVO Branch retinal vein occlusion; CRVO central retinal vein occlusion; CRAO central retinal artery occlusion; BRAO branch retinal artery occlusion; RT retinal tear; SB scleral buckle; PPV pars plana  vitrectomy; VH Vitreous hemorrhage; PRP panretinal laser photocoagulation; IVK intravitreal kenalog; VMT vitreomacular traction; MH Macular hole;  NVD neovascularization of the disc; NVE neovascularization elsewhere; AREDS age related eye disease study; ARMD age related macular degeneration; POAG primary open angle glaucoma; EBMD epithelial/anterior basement membrane dystrophy; ACIOL anterior chamber intraocular lens; IOL intraocular lens; PCIOL posterior chamber intraocular lens; Phaco/IOL phacoemulsification with intraocular lens placement; PRK photorefractive keratectomy; LASIK laser assisted in situ keratomileusis; HTN hypertension; DM diabetes mellitus; COPD chronic obstructive pulmonary disease

## 2022-12-28 ENCOUNTER — Ambulatory Visit (INDEPENDENT_AMBULATORY_CARE_PROVIDER_SITE_OTHER): Payer: No Typology Code available for payment source | Admitting: Ophthalmology

## 2022-12-28 ENCOUNTER — Encounter (INDEPENDENT_AMBULATORY_CARE_PROVIDER_SITE_OTHER): Payer: Self-pay | Admitting: Ophthalmology

## 2022-12-28 DIAGNOSIS — E113511 Type 2 diabetes mellitus with proliferative diabetic retinopathy with macular edema, right eye: Secondary | ICD-10-CM

## 2022-12-28 DIAGNOSIS — Z7985 Long-term (current) use of injectable non-insulin antidiabetic drugs: Secondary | ICD-10-CM

## 2022-12-28 DIAGNOSIS — H25813 Combined forms of age-related cataract, bilateral: Secondary | ICD-10-CM

## 2022-12-28 DIAGNOSIS — I10 Essential (primary) hypertension: Secondary | ICD-10-CM

## 2022-12-28 DIAGNOSIS — H3582 Retinal ischemia: Secondary | ICD-10-CM

## 2022-12-28 DIAGNOSIS — E113412 Type 2 diabetes mellitus with severe nonproliferative diabetic retinopathy with macular edema, left eye: Secondary | ICD-10-CM | POA: Diagnosis not present

## 2022-12-28 DIAGNOSIS — H4311 Vitreous hemorrhage, right eye: Secondary | ICD-10-CM | POA: Diagnosis not present

## 2022-12-28 DIAGNOSIS — H35033 Hypertensive retinopathy, bilateral: Secondary | ICD-10-CM

## 2022-12-28 MED ORDER — BEVACIZUMAB CHEMO INJECTION 1.25MG/0.05ML SYRINGE FOR KALEIDOSCOPE
1.2500 mg | INTRAVITREAL | Status: AC | PRN
Start: 2022-12-28 — End: 2022-12-28
  Administered 2022-12-28: 1.25 mg via INTRAVITREAL

## 2023-01-09 ENCOUNTER — Ambulatory Visit: Payer: No Typology Code available for payment source | Admitting: Podiatry

## 2023-02-04 ENCOUNTER — Ambulatory Visit: Payer: No Typology Code available for payment source | Admitting: Interventional Cardiology

## 2023-02-11 ENCOUNTER — Encounter (INDEPENDENT_AMBULATORY_CARE_PROVIDER_SITE_OTHER): Payer: Self-pay | Admitting: Ophthalmology

## 2023-02-11 ENCOUNTER — Ambulatory Visit (INDEPENDENT_AMBULATORY_CARE_PROVIDER_SITE_OTHER): Payer: No Typology Code available for payment source | Admitting: Ophthalmology

## 2023-02-11 DIAGNOSIS — H25813 Combined forms of age-related cataract, bilateral: Secondary | ICD-10-CM

## 2023-02-11 DIAGNOSIS — Z7985 Long-term (current) use of injectable non-insulin antidiabetic drugs: Secondary | ICD-10-CM | POA: Diagnosis not present

## 2023-02-11 DIAGNOSIS — I10 Essential (primary) hypertension: Secondary | ICD-10-CM

## 2023-02-11 DIAGNOSIS — E113412 Type 2 diabetes mellitus with severe nonproliferative diabetic retinopathy with macular edema, left eye: Secondary | ICD-10-CM | POA: Diagnosis not present

## 2023-02-11 DIAGNOSIS — H3582 Retinal ischemia: Secondary | ICD-10-CM

## 2023-02-11 DIAGNOSIS — E113511 Type 2 diabetes mellitus with proliferative diabetic retinopathy with macular edema, right eye: Secondary | ICD-10-CM

## 2023-02-11 DIAGNOSIS — H4311 Vitreous hemorrhage, right eye: Secondary | ICD-10-CM | POA: Diagnosis not present

## 2023-02-11 DIAGNOSIS — H35033 Hypertensive retinopathy, bilateral: Secondary | ICD-10-CM

## 2023-02-11 MED ORDER — BEVACIZUMAB CHEMO INJECTION 1.25MG/0.05ML SYRINGE FOR KALEIDOSCOPE
1.2500 mg | INTRAVITREAL | Status: AC | PRN
Start: 2023-02-11 — End: 2023-02-11
  Administered 2023-02-11: 1.25 mg via INTRAVITREAL

## 2023-02-11 NOTE — Progress Notes (Signed)
[facility] - Clinic Note  02/11/2023     CHIEF COMPLAINT Patient presents for Retina Follow Up   HISTORY OF PRESENT ILLNESS: [name] is a 57 y.o. male who presents to the clinic today for:   HPI     Retina Follow Up   Patient presents with  Diabetic Retinopathy.  In both eyes.  This started months ago.  Severity is moderate.  Duration of 6 weeks.  Since onset it is stable.  I, the attending physician,  performed the HPI with the patient and updated documentation appropriately.        Comments   Patient feels the right eye is still blurry. He is not using eye drops. He does not check his blood sugar. His A1c is 8.      Last edited by [name], MD on 02/11/2023 10:48 AM.    Pt states he can tell it's time for an injection   Referring physician: Clinic, [facility] [address] [facility] [place],  [place] [address]  HISTORICAL INFORMATION:   Selected notes from the MEDICAL RECORD NUMBER Referred by [facility] for retinal hemes LEE:  Ocular Hx- PMH-    CURRENT MEDICATIONS: No current outpatient medications on file. (Ophthalmic Drugs)   No current facility-administered medications for this visit. (Ophthalmic Drugs)   Current Outpatient Medications (Other)  Medication Sig   AMLODIPINE BESYLATE PO Take 10 mg by mouth daily.   Cholecalciferol 25 MCG (1000 UT) capsule Take by mouth.   Docusate Sodium (DSS) 100 MG CAPS Take by mouth.   escitalopram (LEXAPRO) 10 MG tablet Take 10 mg by mouth daily.   furosemide (LASIX) 40 MG tablet Take 1 tablet (40 mg total) by mouth daily as needed.   gabapentin (NEURONTIN) 800 MG tablet Take 800 mg by mouth QID.   hydrOXYzine (VISTARIL) 25 MG capsule Take 25 mg by mouth 3 (three) times daily as needed. One tablet daily   ibuprofen (ADVIL) 800 MG tablet Take 800 mg by mouth every 8 (eight) hours as needed.   loratadine (CLARITIN) 10 MG tablet Take by mouth.   losartan (COZAAR) 100 MG tablet Take 100  mg by mouth daily.   methocarbamol (ROBAXIN) 750 MG tablet TAKE ONE TABLET BY MOUTH TWICE A DAY AS NEEDED FOR MUSCLE SPASM   naloxone (NARCAN) nasal spray 4 mg/0.1 mL SPRAY 1 SPRAY INTO ONE NOSTRIL AS DIRECTED FOR OPIOID OVERDOSE - CALL 911 IMMEDIATELY, ADMINISTER DOSE, THEN TURN PERSON ON SIDE - IF NO RESPONSE IN 2-3 MINUTES OR PERSON RESPONDS BUT RELAPSES, REPEAT USING A NEW SPRAY DEVICE AND SPRAY INTO THE OTHER NOSTRIL   naproxen (NAPROSYN) 500 MG tablet Take 1 tablet by mouth 2 (two) times daily.   omeprazole (PRILOSEC) 40 MG capsule Take 40 mg by mouth daily.   oxyCODONE-acetaminophen (PERCOCET) 10-325 MG tablet Take 1 tablet by mouth every 4 (four) hours as needed for pain. 1 q 6 hours and 1 at bedtime   polyethylene glycol (MIRALAX / GLYCOLAX) 17 g packet Take 17 g by mouth daily.   ranitidine (ZANTAC) 75 MG tablet Take by mouth.   rosuvastatin (CRESTOR) 10 MG tablet Take 1 tablet (10 mg total) by mouth daily.   Semaglutide (OZEMPIC, 1 MG/DOSE, [place]) Inject 1 mg into the skin once a week.   sertraline (ZOLOFT) 25 MG tablet Take 25 mg by mouth daily. Per patient is taking 1/2 tablet daily   spironolactone (ALDACTONE) 25 MG tablet Take 25 mg by mouth daily.  testosterone cypionate (DEPOTESTOTERONE CYPIONATE) 100 MG/ML injection Inject 100 mg into the muscle once a week.   triamcinolone cream (KENALOG) 0.1 % APPLY THIN LAYER TO AFFECTED AREA TWICE A DAY AS NEEDED FOR ECZEMA   zolpidem (AMBIEN) 10 MG tablet 1 tablet at bedtime as needed Orally Once a day   No current facility-administered medications for this visit. (Other)   REVIEW OF SYSTEMS: ROS   Positive for: Endocrine, Eyes Negative for: Constitutional, Gastrointestinal, Neurological, Skin, Genitourinary, Musculoskeletal, HENT, Cardiovascular, Respiratory, Psychiatric, Allergic/Imm, Heme/Lymph Last edited by Charlette Caffey, COT on 02/11/2023  8:56 AM.     ALLERGIES Allergies  Allergen Reactions   Lisinopril Other (See  Comments) and Hives    Other reaction(s): Electrocardiogram abnormal  Other Reaction(s): Electrocardiogram abnormal, Abdominal pain, Electrocardiogram abnormal, Abdominal pain   Sildenafil Nausea And Vomiting   PAST MEDICAL HISTORY Past Medical History:  Diagnosis Date   Arthritis    Diabetes mellitus    Fistula, perirectal    GERD (gastroesophageal reflux disease)    Hypertension    Osteomyelitis of great toe of right foot (HCC) 10/31/2021   Polymicrobial bacterial infection 10/31/2021   PTSD (post-traumatic stress disorder)    PTSD (post-traumatic stress disorder) 10/31/2021   Small bowel obstruction (HCC)    Past Surgical History:  Procedure Laterality Date   RECTAL SURGERY     FAMILY HISTORY Family History  Problem Relation Age of Onset   Heart attack Father    Obesity Father     SOCIAL HISTORY Social History   Tobacco Use   Smoking status: Some Days    Types: Cigars   Smokeless tobacco: Never   Tobacco comments:    Occasional cigar  Vaping Use   Vaping status: Never Used  Substance Use Topics   Alcohol use: Yes    Comment: occ   Drug use: No       OPHTHALMIC EXAM:  Base Eye Exam     Visual Acuity (Snellen - Linear)       Right Left   Dist cc CF at 3' 20/25   Dist ph cc NI     Correction: Glasses         Tonometry (Tonopen, 9:02 AM)       Right Left   Pressure 16 16         Pupils       Dark Light Shape React APD   Right 2 2 Round NR None   Left 2 2 Round NR None         Visual Fields       Left Right    Full Full         Extraocular Movement       Right Left    Full, Ortho Full, Ortho         Neuro/Psych     Oriented x3: Yes   Mood/Affect: Normal         Dilation     Both eyes: 1.0% Mydriacyl, 2.5% Phenylephrine @ 8:59 AM           Slit Lamp and Fundus Exam     Slit Lamp Exam       Right Left   Lids/Lashes Dermatochalasis - upper lid Dermatochalasis - upper lid   Conjunctiva/Sclera mild melanosis,  nasal pingeucula mild melanosis, nasal and temporal pinguecula   Cornea arcus, 2+ fine Punctate epithelial erosions, tear film debris arcus, 1+2+ fine PEE, mild tear film debris   Anterior Chamber deep, clear,  narrow temporal angle Deep and quiet   Iris Round and dilated, No NVI Round and dilated, No NVI   Lens 3+ Nuclear sclerosis with brunescence, 2-3+ Cortical cataract 3+ Nuclear sclerosis with brunescence, 3+ Cortical cataract   Anterior Vitreous Vitreous syneresis, blood stained vitreous condensations - improved Vitreous syneresis         Fundus Exam       Right Left   Disc Pink and Sharp, no NVD, Compact Pink and Sharp, no NVD, mild PPA   C/D Ratio 0.2 0.2   Macula Flat, good foveal reflex, RPE mottling and clumping, minimal heme, no edema, CWS superior mac - improving Blunted foveal reflex, central edema with cluster of DBH temporal fovea - improved, scattered DBH and CWS   Vessels attenuated, mild tortuosity attenuated, Tortuous   Periphery Attached, scattered DBH and CWS posteriorly, light PRP changes 360 Attached, scattered DBH and CWS posteriorly           Refraction     Wearing Rx       Sphere Cylinder Axis Add   Right +2.50 +0.50 005 +2.50   Left +2.75 +0.75 009 +2.50            IMAGING AND PROCEDURES  Imaging and Procedures for 02/11/2023  OCT, Retina - OU - Both Eyes       Right Eye Quality was good. Central Foveal Thickness: 228. Progression has been stable. Findings include normal foveal contour, no IRF, no SRF, outer retinal atrophy, vitreomacular adhesion (central ellipsoid signal disruptions and thinning; vitreous opacities -- slightly improved; irregular lamination, mild scattered IRHM).   Left Eye Quality was good. Central Foveal Thickness: 264. Progression has improved. Findings include normal foveal contour, no SRF, intraretinal hyper-reflective material, intraretinal fluid (Mild interval improvement in IRF / edema temporal macula ).    Notes *Images captured and stored on drive  Diagnosis / Impression:  OD: central ellipsoid signal disruptions; persistent vitreous opacities -- improved; irregular lamination, mild scattered IRHM OS: Mild interval improvement in IRF / edema temporal macula   Clinical management:  See below  Abbreviations: NFP - Normal foveal profile. CME - cystoid macular edema. PED - pigment epithelial detachment. IRF - intraretinal fluid. SRF - subretinal fluid. EZ - ellipsoid zone. ERM - epiretinal membrane. ORA - outer retinal atrophy. ORT - outer retinal tubulation. SRHM - subretinal hyper-reflective material. IRHM - intraretinal hyper-reflective material      Intravitreal Injection, Pharmacologic Agent - OS - Left Eye       Time Out 02/11/2023. 9:16 AM. Confirmed correct patient, procedure, site, and patient consented.   Anesthesia Topical anesthesia was used. Anesthetic medications included Lidocaine 2%, Proparacaine 0.5%.   Procedure Preparation included 5% betadine to ocular surface, eyelid speculum. A (33g) needle was used.   Injection: 1.25 mg Bevacizumab 1.25mg /0.59ml   Route: Intravitreal, Site: Left Eye   NDC: [id number], Lot: [id number], Expiration date: 10/23/2023   Post-op Post injection exam found visual acuity of at least counting fingers. The patient tolerated the procedure well. There were no complications. The patient received written and verbal post procedure care education. Post injection medications were not given.            ASSESSMENT/PLAN:    ICD-10-CM   1. 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 Vitreous hemorrhage of right eye (HCC)  H43.11     3. Severe nonproliferative diabetic retinopathy of left eye with macular edema associated with  type 2 diabetes mellitus (HCC)  G64.4034     4. Long-term (current) use of injectable non-insulin antidiabetic drugs  Z79.85     5. Essential hypertension  I10     6. Hypertensive retinopathy of both eyes  H35.033     7. Retinal ischemia  H35.82     8. Combined forms of age-related cataract of both eyes  H25.813      1-4.  Proliferative diabetic retinopathy OD, Severe non-proliferative diabetic retinopathy, OS  - pt lost to follow up from 06.16.23 to 11.14.23 - pt reports recent improvement in A1c from 11 down to 6 since starting Ozempic - s/p IVA OS #1 (03.22.23), #2 (04.21.23), #3 (05.19.23), #4 (06.16.23), #5 (11.15.23), #6 (12.22.24), # 7 (01.26.24), #8 (03.15.24), #9 (05.10.24), #10 (06.28.24) - s/p IVA OD #1 (11.15.23), #2 (12.22.23), #3 (01.26.24), #4 (03.15.24), #5 (05.10.24), #6 (06.28.24) - s/p PRP OD (04.22.24) - exam shows scattered MA, DBH and CWS OU, mild VH OD and central cystic changes OS -- improving - new VH OD noted on 11.14.23 visit  - FA (11.14.23) OD: Enlarged FAZ, scattered patches of vascular non-perfusion greatest temporal periphery, mild perivascular leakage temporal periphery and from MA, focal early NVE superonasal midzone; OS: Mild enlargement of FAZ, scattered patches of vascular non-perfusion greatest temporal periphery, mild perivascular leakage temporal periphery and from MA; no NV -- pt would benefit from PRP OD -- completed 04.22.24 - BCVA OD CF3' (severe retinal ischemia); OS 20/25 -- stable - OCT shows OD: central ellipsoid signal disruptions; persistent vitreous opacities -- improved; irregular lamination, mild scattered IRHM; OS: Mild interval improvement in IRF / edema temporal macula at 7 weeks - recommend IVA OS #11 today, 08.12.24 w/ f/u in 7 wks again - will hold OD today - pt in agreement - RBA of procedure discussed, questions answered - IVA informed consent obtained and signed, 11.16.23 (OU) - see procedure note - f/u 7 weeks - DFE, OCT,  possible injxns  5-7. Hypertensive retinopathy w/ retinal ischemia OU - BP uncontrolled at initial consult -- 170s / 110-120s in office 12.23.22, BP was significantly improved to 147/80 at f/u visit - FA 12.23.22 shows significant retinal ischemia OU -- enlarged FAZ OU (OD> OS) and significant patches of capillary drop out OU (OD>OS)  8. Mixed Cataract OU - The symptoms of cataract, surgical options, and treatments and risks were discussed with patient. - discussed diagnosis and progression - visually significant  - referred to [facility] / Dr. Sherrine Maples for cat eval and establishment of primary eye care  Ophthalmic Meds Ordered this visit:  Meds ordered this encounter  Medications   Bevacizumab (AVASTIN) SOLN 1.25 mg     Return in about 7 weeks (around 04/01/2023) for f/u PDR OU, DFE, OCT.  There are no Patient Instructions on file for this visit.  This document serves as a record of services personally performed by Karie Chimera, MD, PhD. It was created on their behalf by Glee Arvin. Manson Passey, OA an ophthalmic technician. The creation of this record is the provider's dictation and/or activities during the visit.    Electronically signed by: Glee Arvin. Manson Passey, OA 02/11/23 10:51 AM  Karie Chimera, M.D., Ph.D. Diseases & Surgery of the Retina and Vitreous [facility] 02/11/2023   I have reviewed the  above documentation for accuracy and completeness, and I agree with the above. Karie Chimera, M.D., Ph.D. 02/11/23 10:52 AM  Abbreviations: M myopia (nearsighted); A astigmatism; H hyperopia (farsighted); P presbyopia; Mrx spectacle prescription;  CTL contact lenses; OD right eye; OS left eye; OU both eyes  XT exotropia; ET esotropia; PEK punctate epithelial keratitis; PEE punctate epithelial erosions; DES dry eye syndrome; MGD meibomian gland dysfunction; ATs artificial tears; PFAT's preservative free artificial tears; NSC nuclear sclerotic cataract; PSC posterior  subcapsular cataract; ERM epi-retinal membrane; PVD posterior vitreous detachment; RD retinal detachment; DM diabetes mellitus; DR diabetic retinopathy; NPDR non-proliferative diabetic retinopathy; PDR proliferative diabetic retinopathy; CSME clinically significant macular edema; DME diabetic macular edema; dbh dot blot hemorrhages; CWS cotton wool spot; POAG primary open angle glaucoma; C/D cup-to-disc ratio; HVF humphrey visual field; GVF goldmann visual field; OCT optical coherence tomography; IOP intraocular pressure; BRVO Branch retinal vein occlusion; CRVO central retinal vein occlusion; CRAO central retinal artery occlusion; BRAO branch retinal artery occlusion; RT retinal tear; SB scleral buckle; PPV pars plana vitrectomy; VH Vitreous hemorrhage; PRP panretinal laser photocoagulation; IVK intravitreal kenalog; VMT vitreomacular traction; MH Macular hole;  NVD neovascularization of the disc; NVE neovascularization elsewhere; AREDS age related eye disease study; ARMD age related macular degeneration; POAG primary open angle glaucoma; EBMD epithelial/anterior basement membrane dystrophy; ACIOL anterior chamber intraocular lens; IOL intraocular lens; PCIOL posterior chamber intraocular lens; Phaco/IOL phacoemulsification with intraocular lens placement; PRK photorefractive keratectomy; LASIK laser assisted in situ keratomileusis; HTN hypertension; DM diabetes mellitus; COPD chronic obstructive pulmonary disease

## 2023-02-15 ENCOUNTER — Encounter (INDEPENDENT_AMBULATORY_CARE_PROVIDER_SITE_OTHER): Payer: No Typology Code available for payment source | Admitting: Ophthalmology

## 2023-04-01 ENCOUNTER — Encounter (INDEPENDENT_AMBULATORY_CARE_PROVIDER_SITE_OTHER): Payer: No Typology Code available for payment source | Admitting: Ophthalmology

## 2023-04-01 DIAGNOSIS — H35033 Hypertensive retinopathy, bilateral: Secondary | ICD-10-CM

## 2023-04-01 DIAGNOSIS — H25813 Combined forms of age-related cataract, bilateral: Secondary | ICD-10-CM

## 2023-04-01 DIAGNOSIS — H4311 Vitreous hemorrhage, right eye: Secondary | ICD-10-CM

## 2023-04-01 DIAGNOSIS — I10 Essential (primary) hypertension: Secondary | ICD-10-CM

## 2023-04-01 DIAGNOSIS — Z7985 Long-term (current) use of injectable non-insulin antidiabetic drugs: Secondary | ICD-10-CM

## 2023-04-01 DIAGNOSIS — H3582 Retinal ischemia: Secondary | ICD-10-CM

## 2023-04-01 DIAGNOSIS — E113511 Type 2 diabetes mellitus with proliferative diabetic retinopathy with macular edema, right eye: Secondary | ICD-10-CM

## 2023-04-01 DIAGNOSIS — E113412 Type 2 diabetes mellitus with severe nonproliferative diabetic retinopathy with macular edema, left eye: Secondary | ICD-10-CM

## 2023-04-09 ENCOUNTER — Other Ambulatory Visit: Payer: Self-pay

## 2023-04-10 ENCOUNTER — Emergency Department (HOSPITAL_BASED_OUTPATIENT_CLINIC_OR_DEPARTMENT_OTHER)
Admission: EM | Admit: 2023-04-10 | Discharge: 2023-04-10 | Disposition: A | Discharge status: Home / Self Care | Payer: No Typology Code available for payment source | Attending: Emergency Medicine | Admitting: Emergency Medicine

## 2023-04-10 ENCOUNTER — Encounter (HOSPITAL_BASED_OUTPATIENT_CLINIC_OR_DEPARTMENT_OTHER): Payer: Self-pay

## 2023-04-10 ENCOUNTER — Other Ambulatory Visit: Payer: Self-pay

## 2023-04-10 ENCOUNTER — Encounter (INDEPENDENT_AMBULATORY_CARE_PROVIDER_SITE_OTHER): Payer: No Typology Code available for payment source | Admitting: Ophthalmology

## 2023-04-10 DIAGNOSIS — H4311 Vitreous hemorrhage, right eye: Secondary | ICD-10-CM

## 2023-04-10 DIAGNOSIS — H25813 Combined forms of age-related cataract, bilateral: Secondary | ICD-10-CM

## 2023-04-10 DIAGNOSIS — E113412 Type 2 diabetes mellitus with severe nonproliferative diabetic retinopathy with macular edema, left eye: Secondary | ICD-10-CM

## 2023-04-10 DIAGNOSIS — Z794 Long term (current) use of insulin: Secondary | ICD-10-CM | POA: Insufficient documentation

## 2023-04-10 DIAGNOSIS — L738 Other specified follicular disorders: Secondary | ICD-10-CM

## 2023-04-10 DIAGNOSIS — H3582 Retinal ischemia: Secondary | ICD-10-CM

## 2023-04-10 DIAGNOSIS — H35033 Hypertensive retinopathy, bilateral: Secondary | ICD-10-CM

## 2023-04-10 DIAGNOSIS — L731 Pseudofolliculitis barbae: Secondary | ICD-10-CM | POA: Diagnosis not present

## 2023-04-10 DIAGNOSIS — R220 Localized swelling, mass and lump, head: Secondary | ICD-10-CM | POA: Diagnosis present

## 2023-04-10 DIAGNOSIS — Z7985 Long-term (current) use of injectable non-insulin antidiabetic drugs: Secondary | ICD-10-CM

## 2023-04-10 DIAGNOSIS — I10 Essential (primary) hypertension: Secondary | ICD-10-CM

## 2023-04-10 DIAGNOSIS — E113511 Type 2 diabetes mellitus with proliferative diabetic retinopathy with macular edema, right eye: Secondary | ICD-10-CM

## 2023-04-10 MED ORDER — DOXYCYCLINE HYCLATE 100 MG PO CAPS: 100.0000 mg | ORAL_CAPSULE | Freq: Two times a day (BID) | ORAL | 0 refills | Status: DC

## 2023-04-10 MED ORDER — DOXYCYCLINE HYCLATE 100 MG PO TABS
100.0000 mg | ORAL_TABLET | Freq: Once | ORAL | Status: AC
  Administered 2023-04-10: 100 mg via ORAL
  Filled 2023-04-10: qty 1

## 2023-04-10 NOTE — ED Triage Notes (Signed)
Patient reports abscess on the lower left jaw. Area has some drainage. Patient states its been bothering him for a few days. 10/10.
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
## 2023-04-10 NOTE — ED Provider Notes (Signed)
[place] [facility]  Provider Note  CSN: [id number] Arrival date & time: 04/10/23 0026  History Chief Complaint  Patient presents with   Abscess    [name] is a 57 y.o. male developed boil on his left face at the jaw line about a week ago after shaving. He reports increased pain and swelling since then. Expressed some pus earlier but none now. No fevers. He is diabetic.    Home Medications Prior to Admission medications   Medication Sig Start Date End Date Taking? Authorizing Provider  AMLODIPINE BESYLATE PO Take 10 mg by mouth daily.    [provider]  Cholecalciferol 25 MCG (1000 UT) capsule Take by mouth.    [provider]  Docusate Sodium (DSS) 100 MG CAPS Take by mouth. 03/19/20   [provider]  escitalopram (LEXAPRO) 10 MG tablet Take 10 mg by mouth daily.    [provider]  furosemide (LASIX) 40 MG tablet Take 1 tablet (40 mg total) by mouth daily as needed. 09/27/22   Corky Crafts, MD  gabapentin (NEURONTIN) 800 MG tablet Take 800 mg by mouth QID.    [provider]  hydrOXYzine (VISTARIL) 25 MG capsule Take 25 mg by mouth 3 (three) times daily as needed. One tablet daily    [provider]  ibuprofen (ADVIL) 800 MG tablet Take 800 mg by mouth every 8 (eight) hours as needed.    [provider]  loratadine (CLARITIN) 10 MG tablet Take by mouth. 05/04/20   [provider]  losartan (COZAAR) 100 MG tablet Take 100 mg by mouth daily.    [provider]  methocarbamol (ROBAXIN) 750 MG tablet TAKE ONE TABLET BY MOUTH TWICE A DAY AS NEEDED FOR MUSCLE SPASM 05/27/22   [provider]  naloxone (NARCAN) nasal spray 4 mg/0.1 mL SPRAY 1 SPRAY INTO ONE NOSTRIL AS DIRECTED FOR OPIOID OVERDOSE - CALL 911 IMMEDIATELY, ADMINISTER DOSE, THEN TURN PERSON ON SIDE - IF NO RESPONSE IN 2-3 MINUTES OR PERSON RESPONDS BUT RELAPSES, REPEAT USING A NEW SPRAY  DEVICE AND SPRAY INTO THE OTHER NOSTRIL 09/19/22   [provider]  naproxen (NAPROSYN) 500 MG tablet Take 1 tablet by mouth 2 (two) times daily. 04/21/22   [provider]  omeprazole (PRILOSEC) 40 MG capsule Take 40 mg by mouth daily.    [provider]  oxyCODONE-acetaminophen (PERCOCET) 10-325 MG tablet Take 1 tablet by mouth every 4 (four) hours as needed for pain. 1 q 6 hours and 1 at bedtime    [provider]  polyethylene glycol (MIRALAX / GLYCOLAX) 17 g packet Take 17 g by mouth daily. 03/19/20   Arby Barrette, MD  ranitidine (ZANTAC) 75 MG tablet Take by mouth. 01/08/14   [provider]  rosuvastatin (CRESTOR) 10 MG tablet Take 1 tablet (10 mg total) by mouth daily. 09/27/22   Corky Crafts, MD  Semaglutide (OZEMPIC, 1 MG/DOSE, [place]) Inject 1 mg into the skin once a week.    [provider]  sertraline (ZOLOFT) 25 MG tablet Take 25 mg by mouth daily. Per patient is taking 1/2 tablet daily    [provider]  spironolactone (ALDACTONE) 25 MG tablet Take 25 mg by mouth daily.    [provider]  testosterone cypionate (DEPOTESTOTERONE CYPIONATE) 100 MG/ML injection Inject 100 mg into the muscle once a week. 06/13/20   [provider]  triamcinolone cream (KENALOG) 0.1 % APPLY THIN LAYER  TO AFFECTED AREA TWICE A DAY AS NEEDED FOR ECZEMA 06/16/22   [provider]  zolpidem (AMBIEN) 10 MG tablet 1 tablet at bedtime as needed Orally Once a day    [provider]  QUEtiapine (SEROQUEL) 25 MG tablet Take 25 mg by mouth at bedtime.  05/04/20  [provider]     Allergies    Lisinopril and Sildenafil   Review of Systems   Review of Systems Please see HPI for pertinent positives and negatives  Physical Exam BP (!) 149/92 (BP Location: Left Arm)   Pulse 82   Temp 98 F (36.7 C)   Resp 18   Ht 5\' 9"  (1.753 m)   Wt 90.7 kg   SpO2 99%   BMI 29.53 kg/m   Physical  Exam Vitals and nursing note reviewed.  HENT:     Head: Normocephalic.     Comments: 2cm x 3cm area of induration, erythema and tenderness to L jaw, does not appear to be odontogenic. No fluctuance.    Nose: Nose normal.  Eyes:     Extraocular Movements: Extraocular movements intact.  Pulmonary:     Effort: Pulmonary effort is normal.  Musculoskeletal:        General: Normal range of motion.     Cervical back: Neck supple.  Skin:    Findings: No rash (on exposed skin).  Neurological:     Mental Status: He is alert and oriented to person, place, and time.  Psychiatric:        Mood and Affect: Mood normal.     ED Results / Procedures / Treatments   EKG None  Procedures Procedures  Medications Ordered in the ED Medications  doxycycline (VIBRA-TABS) tablet 100 mg (has no administration in time range)    Initial Impression and Plan  Patient here with likely spontaneously drained abscess with residual cellulitis. Will treat with doxycycline. Encouraged to keep tight control of his blood sugar. PCP follow up, RTED for any other concerns.    ED Course       MDM Rules/Calculators/A&P Medical Decision Making Problems Addressed: Folliculitis barbae: acute illness or injury  Risk Prescription drug management.     Final Clinical Impression(s) / ED Diagnoses Final diagnoses:  Folliculitis barbae    Rx / DC Orders ED Discharge Orders     None        Pollyann Savoy, MD 04/10/23 [phone]

## 2023-04-17 ENCOUNTER — Encounter (INDEPENDENT_AMBULATORY_CARE_PROVIDER_SITE_OTHER): Payer: Self-pay | Admitting: Ophthalmology

## 2023-04-17 ENCOUNTER — Ambulatory Visit (INDEPENDENT_AMBULATORY_CARE_PROVIDER_SITE_OTHER): Payer: No Typology Code available for payment source | Admitting: Ophthalmology

## 2023-04-17 DIAGNOSIS — H25813 Combined forms of age-related cataract, bilateral: Secondary | ICD-10-CM

## 2023-04-17 DIAGNOSIS — E113511 Type 2 diabetes mellitus with proliferative diabetic retinopathy with macular edema, right eye: Secondary | ICD-10-CM | POA: Diagnosis not present

## 2023-04-17 DIAGNOSIS — Z7985 Long-term (current) use of injectable non-insulin antidiabetic drugs: Secondary | ICD-10-CM

## 2023-04-17 DIAGNOSIS — E113412 Type 2 diabetes mellitus with severe nonproliferative diabetic retinopathy with macular edema, left eye: Secondary | ICD-10-CM

## 2023-04-17 DIAGNOSIS — H4311 Vitreous hemorrhage, right eye: Secondary | ICD-10-CM | POA: Diagnosis not present

## 2023-04-17 DIAGNOSIS — I10 Essential (primary) hypertension: Secondary | ICD-10-CM

## 2023-04-17 DIAGNOSIS — H3582 Retinal ischemia: Secondary | ICD-10-CM

## 2023-04-17 DIAGNOSIS — H35033 Hypertensive retinopathy, bilateral: Secondary | ICD-10-CM

## 2023-04-17 MED ORDER — BEVACIZUMAB CHEMO INJECTION 1.25MG/0.05ML SYRINGE FOR KALEIDOSCOPE
1.2500 mg | INTRAVITREAL | Status: AC | PRN
Start: 2023-04-17 — End: 2023-04-17
  Administered 2023-04-17: 1.25 mg via INTRAVITREAL

## 2023-05-24 NOTE — Progress Notes (Shared)
[facility] - Clinic Note  06/05/2023     CHIEF COMPLAINT Patient presents for No chief complaint on file.   HISTORY OF PRESENT ILLNESS: [name] is a 57 y.o. male who presents to the clinic today for:      Referring physician: Clinic, [facility] [address] [facility] [place],  [place] [address]  HISTORICAL INFORMATION:   Selected notes from the MEDICAL RECORD NUMBER Referred by [facility] for retinal hemes LEE:  Ocular Hx- PMH-    CURRENT MEDICATIONS: No current outpatient medications on file. (Ophthalmic Drugs)   No current facility-administered medications for this visit. (Ophthalmic Drugs)   Current Outpatient Medications (Other)  Medication Sig   AMLODIPINE BESYLATE PO Take 10 mg by mouth daily.   Cholecalciferol 25 MCG (1000 UT) capsule Take by mouth.   Docusate Sodium (DSS) 100 MG CAPS Take by mouth.   doxycycline (VIBRAMYCIN) 100 MG capsule Take 1 capsule (100 mg total) by mouth 2 (two) times daily.   escitalopram (LEXAPRO) 10 MG tablet Take 10 mg by mouth daily.   furosemide (LASIX) 40 MG tablet Take 1 tablet (40 mg total) by mouth daily as needed.   gabapentin (NEURONTIN) 800 MG tablet Take 800 mg by mouth QID.   hydrOXYzine (VISTARIL) 25 MG capsule Take 25 mg by mouth 3 (three) times daily as needed. One tablet daily   ibuprofen (ADVIL) 800 MG tablet Take 800 mg by mouth every 8 (eight) hours as needed.   loratadine (CLARITIN) 10 MG tablet Take by mouth.   losartan (COZAAR) 100 MG tablet Take 100 mg by mouth daily.   methocarbamol (ROBAXIN) 750 MG tablet TAKE ONE TABLET BY MOUTH TWICE A DAY AS NEEDED FOR MUSCLE SPASM   naloxone (NARCAN) nasal spray 4 mg/0.1 mL SPRAY 1 SPRAY INTO ONE NOSTRIL AS DIRECTED FOR OPIOID OVERDOSE - CALL 911 IMMEDIATELY, ADMINISTER DOSE, THEN TURN PERSON ON SIDE - IF NO RESPONSE IN 2-3 MINUTES OR PERSON RESPONDS BUT RELAPSES, REPEAT USING A NEW SPRAY DEVICE AND SPRAY INTO THE OTHER NOSTRIL   naproxen  (NAPROSYN) 500 MG tablet Take 1 tablet by mouth 2 (two) times daily.   omeprazole (PRILOSEC) 40 MG capsule Take 40 mg by mouth daily.   oxyCODONE-acetaminophen (PERCOCET) 10-325 MG tablet Take 1 tablet by mouth every 4 (four) hours as needed for pain. 1 q 6 hours and 1 at bedtime   polyethylene glycol (MIRALAX / GLYCOLAX) 17 g packet Take 17 g by mouth daily.   ranitidine (ZANTAC) 75 MG tablet Take by mouth.   rosuvastatin (CRESTOR) 10 MG tablet Take 1 tablet (10 mg total) by mouth daily.   Semaglutide (OZEMPIC, 1 MG/DOSE, [place]) Inject 1 mg into the skin once a week.   sertraline (ZOLOFT) 25 MG tablet Take 25 mg by mouth daily. Per patient is taking 1/2 tablet daily   spironolactone (ALDACTONE) 25 MG tablet Take 25 mg by mouth daily.   testosterone cypionate (DEPOTESTOTERONE CYPIONATE) 100 MG/ML injection Inject 100 mg into the muscle once a week.   triamcinolone cream (KENALOG) 0.1 % APPLY THIN LAYER TO AFFECTED AREA TWICE A DAY AS NEEDED FOR ECZEMA   zolpidem (AMBIEN) 10 MG tablet 1 tablet at bedtime as needed Orally Once a day   No current facility-administered medications for this visit. (Other)   REVIEW OF SYSTEMS:    ALLERGIES Allergies  Allergen Reactions   Lisinopril Other (See Comments) and Hives    Other reaction(s): Electrocardiogram abnormal  Other Reaction(s): Electrocardiogram abnormal, Abdominal  pain, Electrocardiogram abnormal, Abdominal pain   Sildenafil Nausea And Vomiting   PAST MEDICAL HISTORY Past Medical History:  Diagnosis Date   Arthritis    Diabetes mellitus    Fistula, perirectal    GERD (gastroesophageal reflux disease)    Hypertension    Osteomyelitis of great toe of right foot (HCC) 10/31/2021   Polymicrobial bacterial infection 10/31/2021   PTSD (post-traumatic stress disorder)    PTSD (post-traumatic stress disorder) 10/31/2021   Small bowel obstruction (HCC)    Past Surgical History:  Procedure Laterality Date   RECTAL SURGERY     FAMILY  HISTORY Family History  Problem Relation Age of Onset   Heart attack Father    Obesity Father     SOCIAL HISTORY Social History   Tobacco Use   Smoking status: Some Days    Types: Cigars   Smokeless tobacco: Never   Tobacco comments:    Occasional cigar  Vaping Use   Vaping status: Never Used  Substance Use Topics   Alcohol use: Yes    Comment: occ   Drug use: No       OPHTHALMIC EXAM:  Not recorded     IMAGING AND PROCEDURES  Imaging and Procedures for 06/05/2023          ASSESSMENT/PLAN:  No diagnosis found.  1-4.  Proliferative diabetic retinopathy OD, Severe non-proliferative diabetic retinopathy, OS  - A1c: 8.4 on 03.28.24  - pt lost to follow up from 06.16.23 to 11.14.23 - pt reports recent improvement in A1c from 11 down to 6 since starting Ozempic - s/p IVA OS #1 (03.22.23), #2 (04.21.23), #3 (05.19.23), #4 (06.16.23), #5 (11.15.23), #6 (12.22.24), # 7 (01.26.24), #8 (03.15.24), #9 (05.10.24), #10 (06.28.24), #11 (08.12.24), #12 (10.16.24) - s/p IVA OD #1 (11.15.23), #2 (12.22.23), #3 (01.26.24), #4 (03.15.24), #5 (05.10.24), #6 (06.28.24) - s/p PRP OD (04.22.24) - exam shows scattered MA, DBH and CWS OU, mild VH OD and central cystic changes OS -- improving - new VH OD noted on 11.14.23 visit  - FA (11.14.23) OD: Enlarged FAZ, scattered patches of vascular non-perfusion greatest temporal periphery, mild perivascular leakage temporal periphery and from MA, focal early NVE superonasal midzone; OS: Mild enlargement of FAZ, scattered patches of vascular non-perfusion greatest temporal periphery, mild perivascular leakage temporal periphery and from MA; no NV -- pt would benefit from PRP OD -- completed 04.22.24 - BCVA OD 20/200 from CF (severe retinal ischemia); OS 20/20 from 20/25 - OCT shows OD: central ellipsoid signal disruptions; persistent vitreous opacities -- improved; irregular lamination, mild scattered IRHM; OS: Mild interval increase in IRF /  edema temporal macula at 9 weeks - recommend IVA OS #13 today, 12.04.24 w/ f/u back to 7 wks again - will hold OD today - pt in agreement - RBA of procedure discussed, questions answered - IVA informed consent obtained and signed, 11.16.23 (OU) - see procedure note - f/u 7 weeks - DFE, OCT, possible injxns  5-7. Hypertensive retinopathy w/ retinal ischemia OU - BP uncontrolled at initial consult -- 170s / 110-120s in office 12.23.22, BP was significantly improved to 147/80 at f/u visit - FA 12.23.22 shows significant retinal ischemia OU -- enlarged FAZ OU (OD> OS) and significant patches of capillary drop out OU (OD>OS)  8. Mixed Cataract OU - The symptoms of cataract, surgical options, and treatments and risks were discussed with patient. - discussed diagnosis and progression - visually significant  - referred to [facility] / Dr. Sherrine Maples for cat eval and establishment  of primary eye care  Ophthalmic Meds Ordered this visit:  No orders of the defined types were placed in this encounter.    No follow-ups on file.  There are no Patient Instructions on file for this visit.  This document serves as a record of services personally performed by Karie Chimera, MD, PhD. It was created on their behalf by De Blanch, an ophthalmic technician. The creation of this record is the provider's dictation and/or activities during the visit.    Electronically signed by: De Blanch, OA, 05/24/23  10:16 AM   Karie Chimera, M.D., Ph.D. Diseases & Surgery of the Retina and Vitreous [facility] 04/17/2023     Abbreviations: M myopia (nearsighted); A astigmatism; H hyperopia (farsighted); P presbyopia; Mrx spectacle prescription;  CTL contact lenses; OD right eye; OS left eye; OU both eyes  XT exotropia; ET esotropia; PEK punctate epithelial keratitis; PEE punctate epithelial erosions; DES dry eye syndrome; MGD meibomian gland dysfunction; ATs artificial tears;  PFAT's preservative free artificial tears; NSC nuclear sclerotic cataract; PSC posterior subcapsular cataract; ERM epi-retinal membrane; PVD posterior vitreous detachment; RD retinal detachment; DM diabetes mellitus; DR diabetic retinopathy; NPDR non-proliferative diabetic retinopathy; PDR proliferative diabetic retinopathy; CSME clinically significant macular edema; DME diabetic macular edema; dbh dot blot hemorrhages; CWS cotton wool spot; POAG primary open angle glaucoma; C/D cup-to-disc ratio; HVF humphrey visual field; GVF goldmann visual field; OCT optical coherence tomography; IOP intraocular pressure; BRVO Branch retinal vein occlusion; CRVO central retinal vein occlusion; CRAO central retinal artery occlusion; BRAO branch retinal artery occlusion; RT retinal tear; SB scleral buckle; PPV pars plana vitrectomy; VH Vitreous hemorrhage; PRP panretinal laser photocoagulation; IVK intravitreal kenalog; VMT vitreomacular traction; MH Macular hole;  NVD neovascularization of the disc; NVE neovascularization elsewhere; AREDS age related eye disease study; ARMD age related macular degeneration; POAG primary open angle glaucoma; EBMD epithelial/anterior basement membrane dystrophy; ACIOL anterior chamber intraocular lens; IOL intraocular lens; PCIOL posterior chamber intraocular lens; Phaco/IOL phacoemulsification with intraocular lens placement; PRK photorefractive keratectomy; LASIK laser assisted in situ keratomileusis; HTN hypertension; DM diabetes mellitus; COPD chronic obstructive pulmonary disease

## 2023-06-05 ENCOUNTER — Encounter (INDEPENDENT_AMBULATORY_CARE_PROVIDER_SITE_OTHER): Payer: No Typology Code available for payment source | Admitting: Ophthalmology

## 2023-06-05 DIAGNOSIS — Z7985 Long-term (current) use of injectable non-insulin antidiabetic drugs: Secondary | ICD-10-CM

## 2023-06-05 DIAGNOSIS — H25813 Combined forms of age-related cataract, bilateral: Secondary | ICD-10-CM

## 2023-06-05 DIAGNOSIS — H35033 Hypertensive retinopathy, bilateral: Secondary | ICD-10-CM

## 2023-06-05 DIAGNOSIS — E113412 Type 2 diabetes mellitus with severe nonproliferative diabetic retinopathy with macular edema, left eye: Secondary | ICD-10-CM

## 2023-06-05 DIAGNOSIS — H4311 Vitreous hemorrhage, right eye: Secondary | ICD-10-CM

## 2023-06-05 DIAGNOSIS — H3582 Retinal ischemia: Secondary | ICD-10-CM

## 2023-06-05 DIAGNOSIS — E113511 Type 2 diabetes mellitus with proliferative diabetic retinopathy with macular edema, right eye: Secondary | ICD-10-CM

## 2023-06-05 DIAGNOSIS — I10 Essential (primary) hypertension: Secondary | ICD-10-CM

## 2023-06-12 IMAGING — CT CT ABD-PELV W/ CM
2 of 5 series · 16 of 46 positions shown, 18 images · IV contrast (Omnipaque)
Comparison: CT abdomen and pelvis 06/09/2020.

CLINICAL DATA: Acute abdominal pain.

EXAM:
CT ABDOMEN AND PELVIS WITH CONTRAST
TECHNIQUE: Multidetector CT imaging of the abdomen and pelvis was performed
using the standard protocol following bolus administration of
intravenous contrast.
CONTRAST:  100mL OMNIPAQUE IOHEXOL 300 MG/ML  SOLN

[Series 2: axial st · axial · 0.93mm/px · z∈[-583,-143]mm · 13 of 100 slices shown, 15 images]
[im 6/100  soft-tissue]
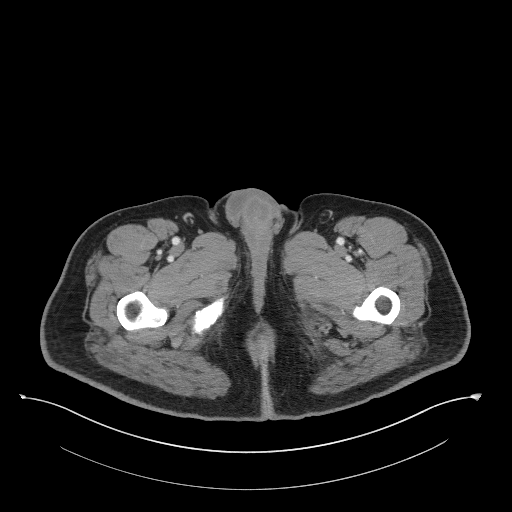
[im 6/100  bone]
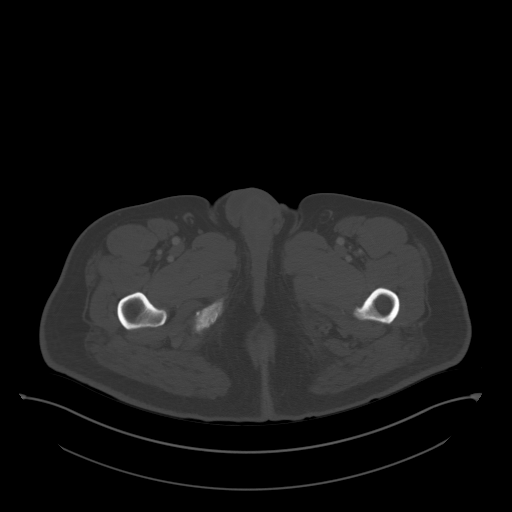
[im 16/100  soft-tissue]
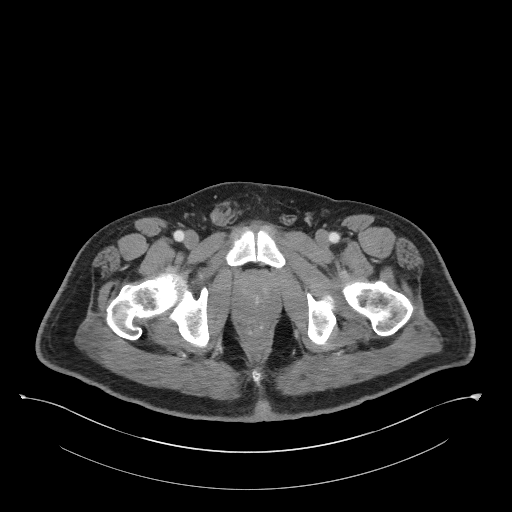
[im 21/100  soft-tissue]
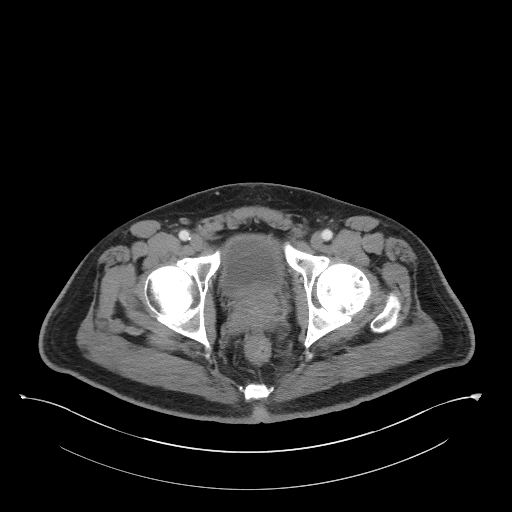
[im 27/100  soft-tissue]
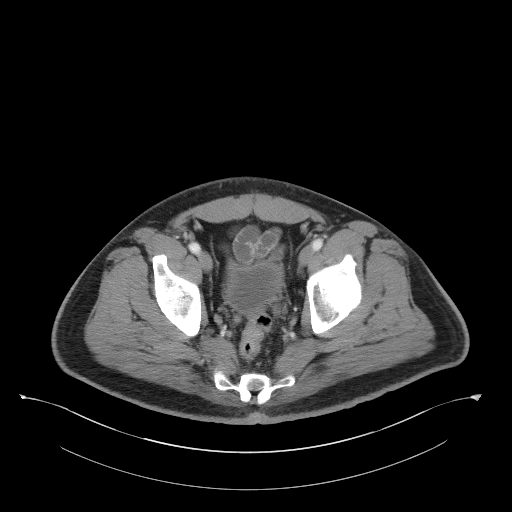
[im 37/100  soft-tissue]
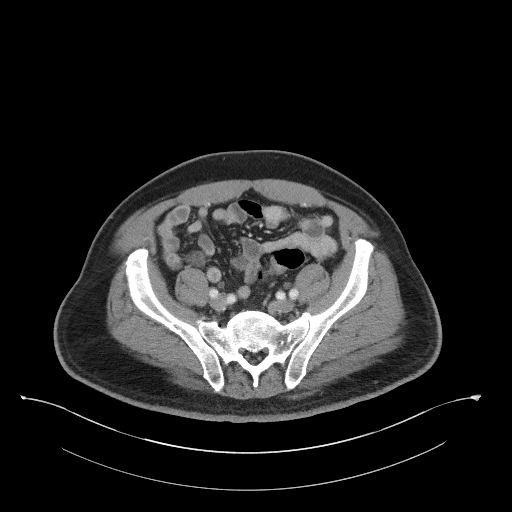
[im 42/100  soft-tissue]
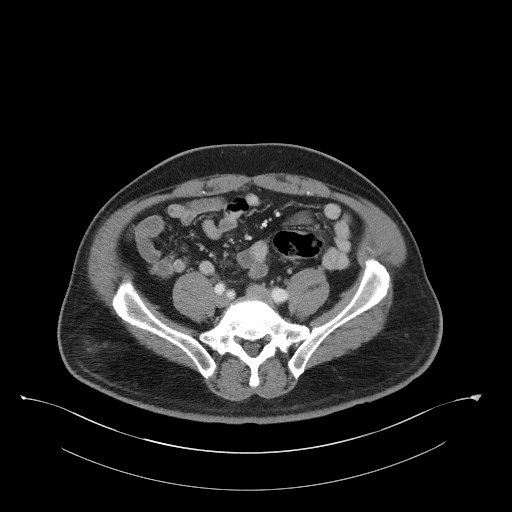
[im 53/100  soft-tissue]
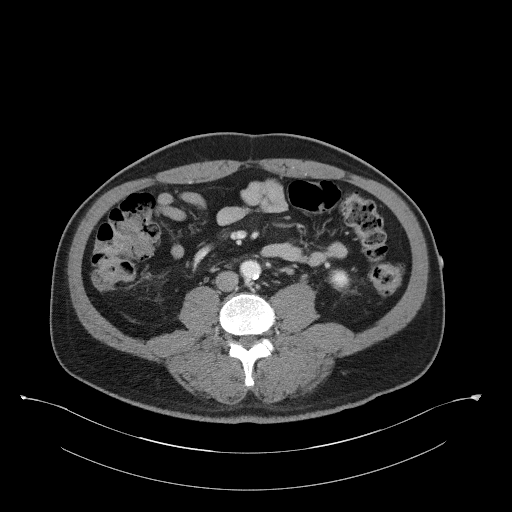
[im 58/100  soft-tissue]
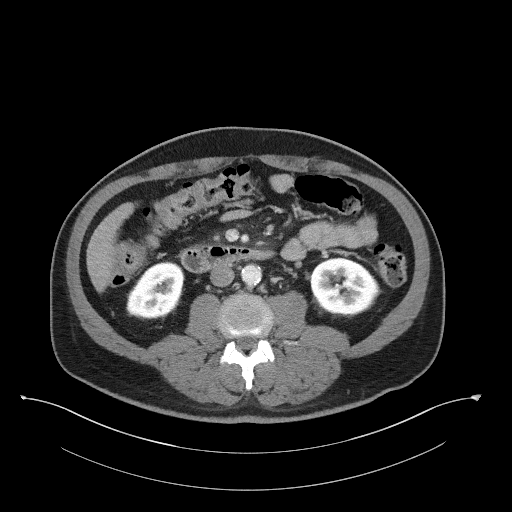
[im 63/100  soft-tissue]
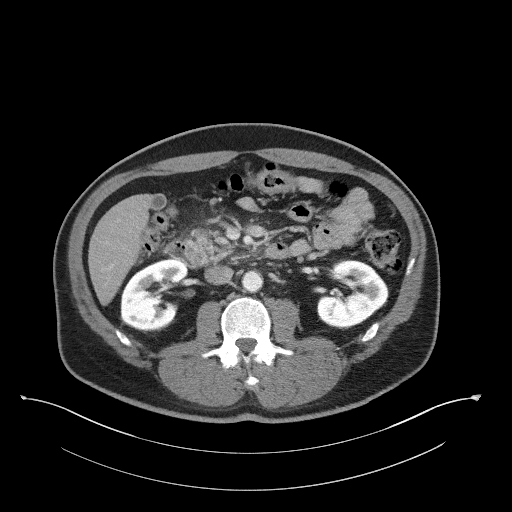
[im 63/100  bone]
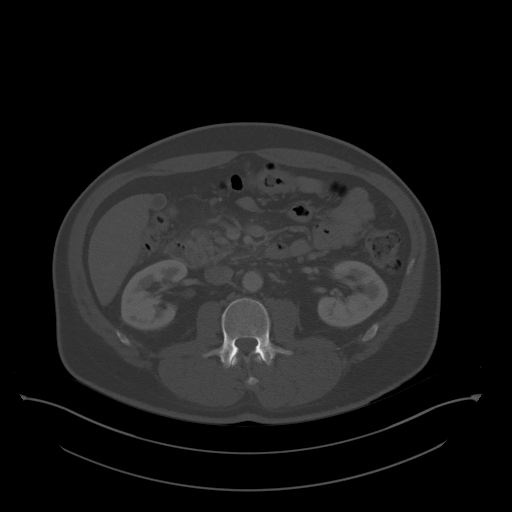
[im 73/100  soft-tissue]
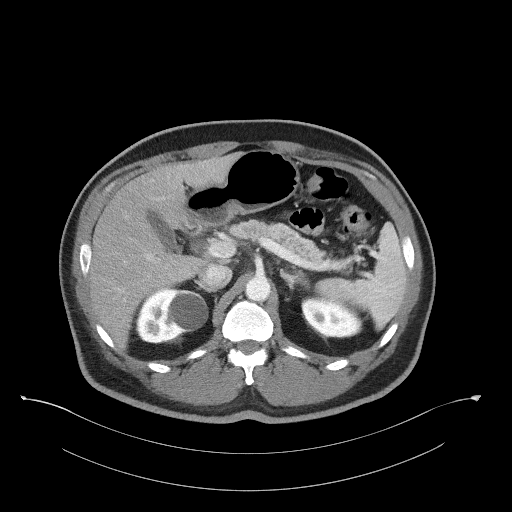
[im 79/100  soft-tissue]
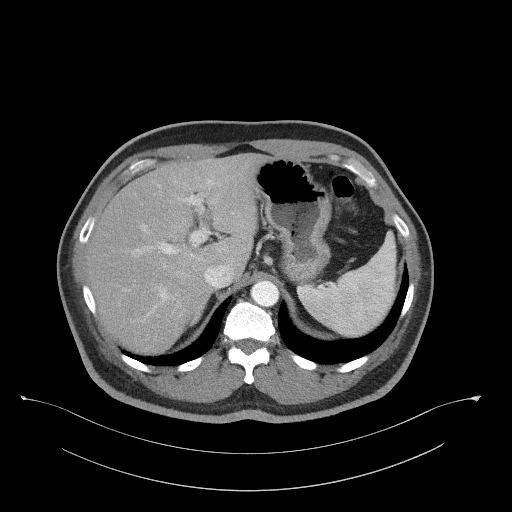
[im 84/100  soft-tissue]
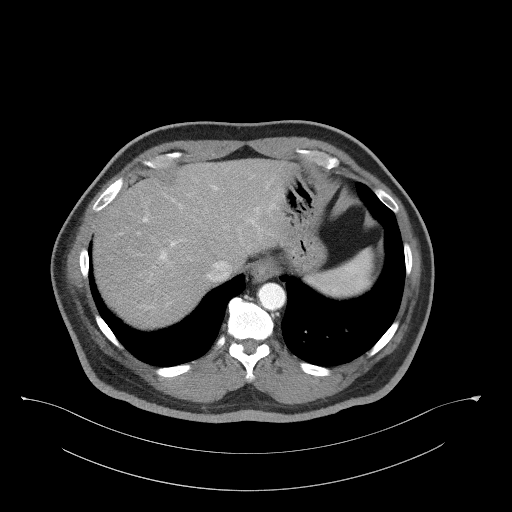
[im 94/100  soft-tissue]
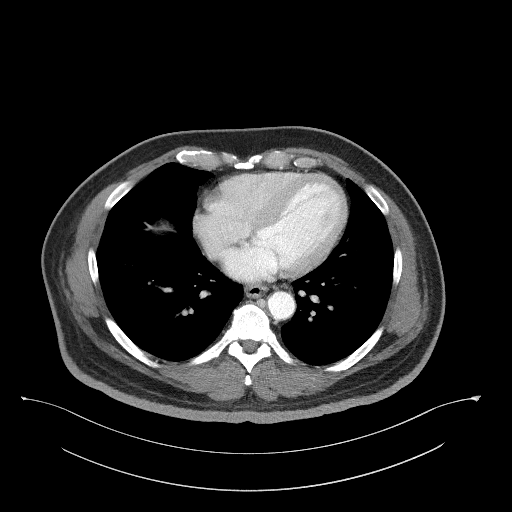

[Series 5: coronal st · coronal · 0.76mm/px · 3 of 104 slices shown]
[im 35/104  soft-tissue]
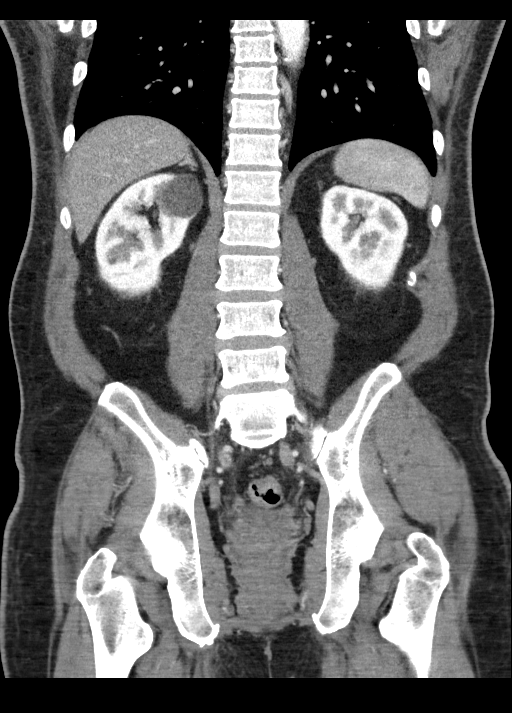
[im 46/104  soft-tissue]
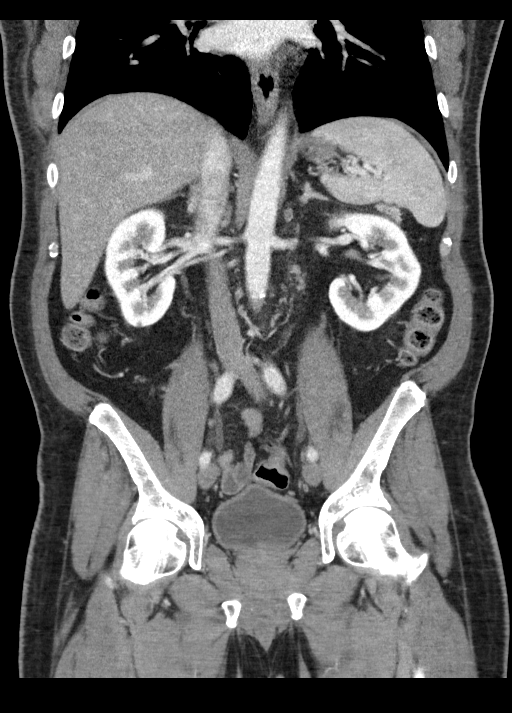
[im 58/104  soft-tissue]
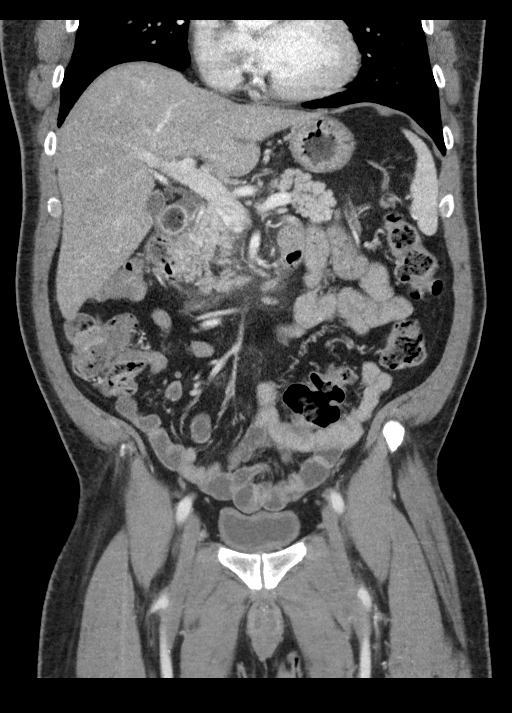

[16 of 46 positions shown; findings below may reference images not displayed]

FINDINGS: Lower chest: No acute abnormality.

Hepatobiliary: No focal liver abnormality is seen. No gallstones,
gallbladder wall thickening, or biliary dilatation.

Pancreas: There is mild inflammatory stranding surrounding the head
and neck of the pancreas. No evidence for ductal dilatation or fluid
collection.

Spleen: Normal in size without focal abnormality.

Adrenals/Urinary Tract: Adrenal glands are unremarkable. Kidneys are
normal, without renal calculi, focal lesion, or hydronephrosis. The
bilateral adrenal glands are within normal limits. There are
bilateral renal cysts and hypodensities which are too small to
characterize measuring up to 3.3 cm similar to the prior study.
There is no hydronephrosis or perinephric fluid. Bladder is within
normal limits.

Stomach/Bowel: Stomach is within normal limits. Appendix appears
normal. No evidence of bowel wall thickening, distention, or
inflammatory changes. There is a large amount of stool throughout
colon.

Vascular/Lymphatic: Aortic atherosclerosis. No enlarged abdominal or
pelvic lymph nodes.

Reproductive: Prostate gland is enlarged, unchanged.

Other: No abdominal wall hernia or abnormality. No abdominopelvic
ascites.

Musculoskeletal: No acute or significant osseous findings.
IMPRESSION: 1. Acute uncomplicated pancreatitis involving the head and neck of
the pancreas.
2. Prostatomegaly.
3.  Aortic Atherosclerosis ([id number]).

## 2023-06-18 ENCOUNTER — Ambulatory Visit (INDEPENDENT_AMBULATORY_CARE_PROVIDER_SITE_OTHER): Payer: No Typology Code available for payment source | Admitting: Ophthalmology

## 2023-06-18 ENCOUNTER — Encounter (INDEPENDENT_AMBULATORY_CARE_PROVIDER_SITE_OTHER): Payer: Self-pay | Admitting: Ophthalmology

## 2023-06-18 DIAGNOSIS — Z7985 Long-term (current) use of injectable non-insulin antidiabetic drugs: Secondary | ICD-10-CM | POA: Diagnosis not present

## 2023-06-18 DIAGNOSIS — E113412 Type 2 diabetes mellitus with severe nonproliferative diabetic retinopathy with macular edema, left eye: Secondary | ICD-10-CM | POA: Diagnosis not present

## 2023-06-18 DIAGNOSIS — H35033 Hypertensive retinopathy, bilateral: Secondary | ICD-10-CM

## 2023-06-18 DIAGNOSIS — H25813 Combined forms of age-related cataract, bilateral: Secondary | ICD-10-CM

## 2023-06-18 DIAGNOSIS — E113511 Type 2 diabetes mellitus with proliferative diabetic retinopathy with macular edema, right eye: Secondary | ICD-10-CM

## 2023-06-18 DIAGNOSIS — H4311 Vitreous hemorrhage, right eye: Secondary | ICD-10-CM

## 2023-06-18 DIAGNOSIS — I10 Essential (primary) hypertension: Secondary | ICD-10-CM

## 2023-06-18 DIAGNOSIS — H3582 Retinal ischemia: Secondary | ICD-10-CM

## 2023-06-18 MED ORDER — BEVACIZUMAB CHEMO INJECTION 1.25MG/0.05ML SYRINGE FOR KALEIDOSCOPE
1.2500 mg | INTRAVITREAL | Status: AC | PRN
Start: 2023-06-18 — End: 2023-06-18
  Administered 2023-06-18: 1.25 mg via INTRAVITREAL

## 2023-06-18 NOTE — Progress Notes (Signed)
[facility] - Clinic Note  06/18/2023     CHIEF COMPLAINT Patient presents for Retina Follow Up   HISTORY OF PRESENT ILLNESS: [name] is a 57 y.o. male who presents to the clinic today for:   HPI     Retina Follow Up   Patient presents with  Diabetic Retinopathy.  In right eye.  This started 7 weeks ago.  I, the attending physician,  performed the HPI with the patient and updated documentation appropriately.        Comments   Patient states that when it gets closer to needing an injection he has to close the right eye to read. He is not using eye drops. He did not check his blood sugar.       Last edited by [name], MD on 06/18/2023  5:09 PM.    Pt states he is having to close his right eye to read   Referring physician: [facility], [facility] [address] [facility] [place],  [place] [address]  HISTORICAL INFORMATION:   Selected notes from the MEDICAL RECORD NUMBER Referred by [facility] for retinal hemes [place]:  Ocular Hx- PMH-    CURRENT MEDICATIONS: No current outpatient medications on file. (Ophthalmic Drugs)   No current facility-administered medications for this visit. (Ophthalmic Drugs)   Current Outpatient Medications (Other)  Medication Sig   AMLODIPINE BESYLATE PO Take 10 mg by mouth daily.   Cholecalciferol 25 MCG (1000 UT) capsule Take by mouth.   Docusate Sodium (DSS) 100 MG CAPS Take by mouth.   doxycycline (VIBRAMYCIN) 100 MG capsule Take 1 capsule (100 mg total) by mouth 2 (two) times daily.   escitalopram (LEXAPRO) 10 MG tablet Take 10 mg by mouth daily.   furosemide (LASIX) 40 MG tablet Take 1 tablet (40 mg total) by mouth daily as needed.   gabapentin (NEURONTIN) 800 MG tablet Take 800 mg by mouth QID.   hydrOXYzine (VISTARIL) 25 MG capsule Take 25 mg by mouth 3 (three) times daily as needed. One tablet daily   ibuprofen (ADVIL) 800 MG tablet Take 800 mg by mouth every 8 (eight) hours as needed.   loratadine  (CLARITIN) 10 MG tablet Take by mouth.   losartan (COZAAR) 100 MG tablet Take 100 mg by mouth daily.   methocarbamol (ROBAXIN) 750 MG tablet TAKE ONE TABLET BY MOUTH TWICE A DAY AS NEEDED FOR MUSCLE SPASM   naloxone (NARCAN) nasal spray 4 mg/0.1 mL SPRAY 1 SPRAY INTO ONE NOSTRIL AS DIRECTED FOR OPIOID OVERDOSE - CALL 911 IMMEDIATELY, ADMINISTER DOSE, THEN TURN PERSON ON SIDE - IF NO RESPONSE IN 2-3 MINUTES OR PERSON RESPONDS BUT RELAPSES, REPEAT USING A NEW SPRAY DEVICE AND SPRAY INTO THE OTHER NOSTRIL   naproxen (NAPROSYN) 500 MG tablet Take 1 tablet by mouth 2 (two) times daily.   omeprazole (PRILOSEC) 40 MG capsule Take 40 mg by mouth daily.   oxyCODONE-acetaminophen (PERCOCET) 10-325 MG tablet Take 1 tablet by mouth every 4 (four) hours as needed for pain. 1 q 6 hours and 1 at bedtime   polyethylene glycol (MIRALAX / GLYCOLAX) 17 g packet Take 17 g by mouth daily.   ranitidine (ZANTAC) 75 MG tablet Take by mouth.   rosuvastatin (CRESTOR) 10 MG tablet Take 1 tablet (10 mg total) by mouth daily.   Semaglutide (OZEMPIC, 1 MG/DOSE, Faith) Inject 1 mg into the skin once a week.   sertraline (ZOLOFT) 25 MG tablet Take 25 mg by mouth daily. Per patient is taking 1/2  tablet daily   spironolactone (ALDACTONE) 25 MG tablet Take 25 mg by mouth daily.   testosterone cypionate (DEPOTESTOTERONE CYPIONATE) 100 MG/ML injection Inject 100 mg into the muscle once a week.   triamcinolone cream (KENALOG) 0.1 % APPLY THIN LAYER TO AFFECTED AREA TWICE A DAY AS NEEDED FOR ECZEMA   zolpidem (AMBIEN) 10 MG tablet 1 tablet at bedtime as needed Orally Once a day   No current facility-administered medications for this visit. (Other)   REVIEW OF SYSTEMS: ROS   Positive for: Endocrine, Eyes Negative for: Constitutional, Gastrointestinal, Neurological, Skin, Genitourinary, Musculoskeletal, HENT, Cardiovascular, Respiratory, Psychiatric, Allergic/Imm, Heme/Lymph Last edited by Charlette Caffey, COT on 06/18/2023  8:43  AM.       ALLERGIES Allergies  Allergen Reactions   Lisinopril Other (See Comments) and Hives    Other reaction(s): Electrocardiogram abnormal  Other Reaction(s): Electrocardiogram abnormal, Abdominal pain, Electrocardiogram abnormal, Abdominal pain   Sildenafil Nausea And Vomiting   PAST MEDICAL HISTORY Past Medical History:  Diagnosis Date   Arthritis    Diabetes mellitus    Fistula, perirectal    GERD (gastroesophageal reflux disease)    Hypertension    Osteomyelitis of great toe of right foot (HCC) 10/31/2021   Polymicrobial bacterial infection 10/31/2021   PTSD (post-traumatic stress disorder)    PTSD (post-traumatic stress disorder) 10/31/2021   Small bowel obstruction (HCC)    Past Surgical History:  Procedure Laterality Date   RECTAL SURGERY     FAMILY HISTORY Family History  Problem Relation Age of Onset   Heart attack Father    Obesity Father     SOCIAL HISTORY Social History   Tobacco Use   Smoking status: Some Days    Types: Cigars   Smokeless tobacco: Never   Tobacco comments:    Occasional cigar  Vaping Use   Vaping status: Never Used  Substance Use Topics   Alcohol use: Yes    Comment: occ   Drug use: No       OPHTHALMIC EXAM:  Base Eye Exam     Visual Acuity (Snellen - Linear)       Right Left   Dist cc 20/400 20/20 -2   Dist ph cc NI     Correction: Glasses         Tonometry (Tonopen, 8:49 AM)       Right Left   Pressure 10 14         Pupils       Dark Light Shape React APD   Right 2 2 Round NR None   Left 2 2 Round NR None         Visual Fields       Left Right    Full Full         Extraocular Movement       Right Left    Full, Ortho Full, Ortho         Neuro/Psych     Oriented x3: Yes   Mood/Affect: Normal         Dilation     Both eyes: 1.0% Mydriacyl, 2.5% Phenylephrine @ 8:45 AM           Slit Lamp and Fundus Exam     Slit Lamp Exam       Right Left   Lids/Lashes  Dermatochalasis - upper lid Dermatochalasis - upper lid   Conjunctiva/Sclera mild melanosis, nasal pingeucula mild melanosis, nasal and temporal pinguecula   Cornea arcus, tear film debris arcus,  1+2+ fine PEE, mild tear film debris   Anterior Chamber deep, clear, narrow temporal angle Deep and quiet   Iris Round and dilated, No NVI Round and dilated, No NVI   Lens 3+ Nuclear sclerosis with brunescence, 2-3+ Cortical cataract 3+ Nuclear sclerosis with brunescence, 3+ Cortical cataract   Anterior Vitreous Vitreous syneresis, blood stained vitreous condensations below disc Vitreous syneresis         Fundus Exam       Right Left   Disc Pink and Sharp, no NVD, Compact, +heme at 0730 Pink and Sharp, no NVD, mild PPA   C/D Ratio 0.2 0.2   Macula Flat, good foveal reflex, RPE mottling and clumping, scattered MA, no edema Blunted foveal reflex, cluster of DBH temporal fovea, cystic changes temporal macula, scattered DBH and CWS   Vessels attenuated, Tortuous attenuated, copper wiring, mild tortuosity   Periphery Attached, scattered DBH and CWS posteriorly, light PRP changes 360 Attached, scattered DBH and CWS posteriorly           Refraction     Wearing Rx       Sphere Cylinder Axis Add   Right +2.50 +0.50 005 +2.50   Left +2.75 +0.75 009 +2.50            IMAGING AND PROCEDURES  Imaging and Procedures for 06/18/2023  OCT, Retina - OU - Both Eyes       Right Eye Quality was good. Central Foveal Thickness: 232. Progression has been stable. Findings include normal foveal contour, no IRF, no SRF, outer retinal atrophy, vitreomacular adhesion (central ellipsoid signal disruptions and thinning; vitreous opacities -- improved; irregular lamination, mild scattered IRHM).   Left Eye Quality was good. Central Foveal Thickness: 262. Progression has improved. Findings include normal foveal contour, no SRF, intraretinal hyper-reflective material, intraretinal fluid (interval improvement  in IRF / edema temporal macula ).   Notes *Images captured and stored on drive  Diagnosis / Impression:  OD: central ellipsoid signal disruptions; persistent vitreous opacities -- improved; irregular lamination, mild scattered IRHM OS: interval improvement in IRF / edema temporal macula   Clinical management:  See below  Abbreviations: NFP - Normal foveal profile. CME - cystoid macular edema. PED - pigment epithelial detachment. IRF - intraretinal fluid. SRF - subretinal fluid. EZ - ellipsoid zone. ERM - epiretinal membrane. ORA - outer retinal atrophy. ORT - outer retinal tubulation. SRHM - subretinal hyper-reflective material. IRHM - intraretinal hyper-reflective material      Intravitreal Injection, Pharmacologic Agent - OS - Left Eye       Time Out 06/18/2023. 9:22 AM. Confirmed correct patient, procedure, site, and patient consented.   Anesthesia Topical anesthesia was used. Anesthetic medications included Lidocaine 2%, Proparacaine 0.5%.   Procedure Preparation included 5% betadine to ocular surface, eyelid speculum. A (33g) needle was used.   Injection: 1.25 mg Bevacizumab 1.25mg /0.77ml   Route: Intravitreal, Site: Left Eye   NDC: P3213405, Lot: [id number], Expiration date: 09/09/2023   Post-op Post injection exam found visual acuity of at least counting fingers. The patient tolerated the procedure well. There were no complications. The patient received written and verbal post procedure care education. Post injection medications were not given.      Intravitreal Injection, Pharmacologic Agent - OD - Right Eye       Time Out 06/18/2023. 9:36 AM. Confirmed correct patient, procedure, site, and patient consented.   Anesthesia Topical anesthesia was used. Anesthetic medications included Lidocaine 2%, Proparacaine 0.5%.   Procedure Preparation included 5% betadine  to ocular surface, eyelid speculum. A (32g) needle was used.   Injection: 1.25 mg Bevacizumab  1.25mg /0.77ml   Route: Intravitreal, Site: Right Eye   NDC: P3213405, Lot: [id number], Expiration date: 09/16/2023   Post-op Post injection exam found visual acuity of at least counting fingers. The patient tolerated the procedure well. There were no complications. The patient received written and verbal post procedure care education.            ASSESSMENT/PLAN:    ICD-10-CM   1. 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 Vitreous hemorrhage of right eye (HCC)  H43.11     3. 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 Long-term (current) use of injectable non-insulin antidiabetic drugs  Z79.85     5. Essential hypertension  I10     6. Hypertensive retinopathy of both eyes  H35.033     7. Retinal ischemia  H35.82     8. Combined forms of age-related cataract of both eyes  H25.813      1-4.  Proliferative diabetic retinopathy OD, Severe non-proliferative diabetic retinopathy, OS  - A1c: 8.4 on 03.28.24  - pt lost to follow up from 06.16.23 to 11.14.23 - pt reports recent improvement in A1c from 11 down to 6 since starting Ozempic - s/p IVA OS #1 (03.22.23), #2 (04.21.23), #3 (05.19.23), #4 (06.16.23), #5 (11.15.23), #6 (12.22.24), # 7 (01.26.24), #8 (03.15.24), #9 (05.10.24), #10 (06.28.24), #11 (08.12.24), #12 (10.16.24) - s/p IVA OD #1 (11.15.23), #2 (12.22.23), #3 (01.26.24), #4 (03.15.24), #5 (05.10.24), #6 (06.28.24) - s/p PRP OD (04.22.24) - exam shows scattered MA, DBH and CWS OU, mild VH OD and central cystic changes OS -- improving - new VH OD noted on 11.14.23  visit  - FA (11.14.23) OD: Enlarged FAZ, scattered patches of vascular non-perfusion greatest temporal periphery, mild perivascular leakage temporal periphery and from MA, focal early NVE superonasal midzone; OS: Mild enlargement of FAZ, scattered patches of vascular non-perfusion greatest temporal periphery, mild perivascular leakage temporal periphery and from MA; no NV -- pt would benefit from PRP OD -- completed 04.22.24 - BCVA OD 20/400 from CF (severe retinal ischemia + cataract); OS 20/20  - OCT shows OD: central ellipsoid signal disruptions; persistent vitreous opacities -- improved; irregular lamination, mild scattered IRHM; OS: interval improvement in IRF / edema temporal macula at 9 weeks - recommend IVA OU (OD #7 and OS #13) today, 12.17.24 w/ f/u back to 8 wks -- exam showed bleeding on right disc -- indication for injection OD - pt in agreement - RBA of procedure discussed, questions answered - IVA informed consent obtained and signed, 11.16.23 (OU) - see procedure note - f/u 8 weeks - DFE, OCT, possible injxns  5-7. Hypertensive retinopathy w/ retinal ischemia OU - BP uncontrolled at initial consult -- 170s / 110-120s in office 12.23.22, BP was significantly improved to 147/80 at f/u visit - FA 12.23.22 shows significant retinal ischemia OU -- enlarged FAZ OU (OD> OS) and significant patches of capillary drop out OU (OD>OS)  8. Mixed Cataract OU - The symptoms of cataract,  surgical options, and treatments and risks were discussed with patient. - discussed diagnosis and progression - visually significant  - referred to [facility] / Dr. Sherrine Maples for cat eval and establishment of primary eye care -- pt has not made it there yet  Ophthalmic Meds Ordered this visit:  Meds ordered this encounter  Medications   Bevacizumab (AVASTIN) SOLN 1.25 mg   Bevacizumab (AVASTIN) SOLN 1.25 mg     Return in about 9 weeks (around 08/20/2023) for f/u PDR OU, DFE, OCT.  There are no Patient  Instructions on file for this visit.  This document serves as a record of services personally performed by Karie Chimera, MD, PhD. It was created on their behalf by Glee Arvin. Manson Passey, OA an ophthalmic technician. The creation of this record is the provider's dictation and/or activities during the visit.    Electronically signed by: Glee Arvin. Manson Passey, OA 06/18/23 5:13 PM  This document serves as a record of services personally performed by Karie Chimera, MD, PhD. It was created on their behalf by Glee Arvin. Manson Passey, OA an ophthalmic technician. The creation of this record is the provider's dictation and/or activities during the visit.    Electronically signed by: Glee Arvin. Manson Passey, OA 06/18/23 5:13 PM  Karie Chimera, M.D., Ph.D. Diseases & Surgery of the Retina and Vitreous [facility] [place] 06/18/2023   I have reviewed the above documentation for accuracy and completeness, and I agree with the above. Karie Chimera, M.D., Ph.D. 06/18/23 5:13 PM  Abbreviations: M myopia (nearsighted); A astigmatism; H hyperopia (farsighted); P presbyopia; Mrx spectacle prescription;  CTL contact lenses; OD right eye; OS left eye; OU both eyes  XT exotropia; ET esotropia; PEK punctate epithelial keratitis; PEE punctate epithelial erosions; DES dry eye syndrome; MGD meibomian gland dysfunction; ATs artificial tears; PFAT's preservative free artificial tears; NSC nuclear sclerotic cataract; PSC posterior subcapsular cataract; ERM epi-retinal membrane; PVD posterior vitreous detachment; RD retinal detachment; DM diabetes mellitus; DR diabetic retinopathy; NPDR non-proliferative diabetic retinopathy; PDR proliferative diabetic retinopathy; CSME clinically significant macular edema; DME diabetic macular edema; dbh dot blot hemorrhages; CWS cotton wool spot; POAG primary open angle glaucoma; C/D cup-to-disc ratio; HVF humphrey visual field; GVF goldmann visual field; OCT optical coherence tomography; IOP  intraocular pressure; BRVO Branch retinal vein occlusion; CRVO central retinal vein occlusion; CRAO central retinal artery occlusion; BRAO branch retinal artery occlusion; RT retinal tear; SB scleral buckle; PPV pars plana vitrectomy; VH Vitreous hemorrhage; PRP panretinal laser photocoagulation; IVK intravitreal kenalog; VMT vitreomacular traction; MH Macular hole;  NVD neovascularization of the disc; NVE neovascularization elsewhere; AREDS age related eye disease study; ARMD age related macular degeneration; POAG primary open angle glaucoma; EBMD epithelial/anterior basement membrane dystrophy; ACIOL anterior chamber intraocular lens; IOL intraocular lens; PCIOL posterior chamber intraocular lens; Phaco/IOL phacoemulsification with intraocular lens placement; PRK photorefractive keratectomy; LASIK laser assisted in situ keratomileusis; HTN hypertension; DM diabetes mellitus; COPD chronic obstructive pulmonary disease

## 2023-08-01 NOTE — Progress Notes (Signed)
[facility] - Clinic Note  08/13/2023     CHIEF COMPLAINT Patient presents for Retina Follow Up   HISTORY OF PRESENT ILLNESS: [name] is a 58 y.o. male who presents to the clinic today for:   HPI     Retina Follow Up   Patient presents with  Diabetic Retinopathy.  In both eyes.  This started 9 weeks ago.  Duration of 9 weeks.  Since onset it is stable.  I, the attending physician,  performed the HPI with the patient and updated documentation appropriately.        Comments   Retina follow up PDR OU and IVA OU pt is reports pt is reporting that vision has been more blurred he got new glasses and did help he denies any flashes has some floaters his last reading was 218 he is not checking regular pt has a cataract eval tomorrow with Dr [name]      Last edited by [name], MD on 08/13/2023  8:54 AM.    Pt has a cataract consult with Dr. Sherrine [name] tomorrow, he states his vision is still going in and out, some days it's blurry, he got new glasses   Referring physician: Clinic, [name] [address] [facility] [place],  [place] [address]  HISTORICAL INFORMATION:   Selected notes from the MEDICAL RECORD NUMBER Referred by [facility] for retinal hemes LEE:  Ocular Hx- PMH-    CURRENT MEDICATIONS: No current outpatient medications on file. (Ophthalmic Drugs)   No current facility-administered medications for this visit. (Ophthalmic Drugs)   Current Outpatient Medications (Other)  Medication Sig   AMLODIPINE BESYLATE PO Take 10 mg by mouth daily.   Cholecalciferol 25 MCG (1000 UT) capsule Take by mouth.   Docusate Sodium (DSS) 100 MG CAPS Take by mouth.   doxycycline (VIBRAMYCIN) 100 MG capsule Take 1 capsule (100 mg total) by mouth 2 (two) times daily.   escitalopram (LEXAPRO) 10 MG tablet Take 10 mg by mouth daily.   furosemide (LASIX) 40 MG tablet Take 1 tablet (40 mg total) by mouth daily as needed.   gabapentin (NEURONTIN) 800 MG tablet  Take 800 mg by mouth QID.   hydrOXYzine (VISTARIL) 25 MG capsule Take 25 mg by mouth 3 (three) times daily as needed. One tablet daily   ibuprofen (ADVIL) 800 MG tablet Take 800 mg by mouth every 8 (eight) hours as needed.   loratadine (CLARITIN) 10 MG tablet Take by mouth.   losartan (COZAAR) 100 MG tablet Take 100 mg by mouth daily.   methocarbamol (ROBAXIN) 750 MG tablet TAKE ONE TABLET BY MOUTH TWICE A DAY AS NEEDED FOR MUSCLE SPASM   naloxone (NARCAN) nasal spray 4 mg/0.1 mL SPRAY 1 SPRAY INTO ONE NOSTRIL AS DIRECTED FOR OPIOID OVERDOSE - CALL 911 IMMEDIATELY, ADMINISTER DOSE, THEN TURN PERSON ON SIDE - IF NO RESPONSE IN 2-3 MINUTES OR PERSON RESPONDS BUT RELAPSES, REPEAT USING A NEW SPRAY DEVICE AND SPRAY INTO THE OTHER NOSTRIL   naproxen (NAPROSYN) 500 MG tablet Take 1 tablet by mouth 2 (two) times daily.   omeprazole (PRILOSEC) 40 MG capsule Take 40 mg by mouth daily.   oxyCODONE-acetaminophen (PERCOCET) 10-325 MG tablet Take 1 tablet by mouth every 4 (four) hours as needed for pain. 1 q 6 hours and 1 at bedtime   polyethylene glycol (MIRALAX / GLYCOLAX) 17 g packet Take 17 g by mouth daily.   ranitidine (ZANTAC) 75 MG tablet Take by mouth.   rosuvastatin (CRESTOR) 10  MG tablet Take 1 tablet (10 mg total) by mouth daily.   Semaglutide (OZEMPIC, 1 MG/DOSE, ) Inject 1 mg into the skin once a week.   sertraline (ZOLOFT) 25 MG tablet Take 25 mg by mouth daily. Per patient is taking 1/2 tablet daily   spironolactone (ALDACTONE) 25 MG tablet Take 25 mg by mouth daily.   testosterone cypionate (DEPOTESTOTERONE CYPIONATE) 100 MG/ML injection Inject 100 mg into the muscle once a week.   triamcinolone cream (KENALOG) 0.1 % APPLY THIN LAYER TO AFFECTED AREA TWICE A DAY AS NEEDED FOR ECZEMA   zolpidem (AMBIEN) 10 MG tablet 1 tablet at bedtime as needed Orally Once a day   No current facility-administered medications for this visit. (Other)   REVIEW OF SYSTEMS: ROS   Positive for: Endocrine,  Eyes Negative for: Constitutional, Gastrointestinal, Neurological, Skin, Genitourinary, Musculoskeletal, HENT, Cardiovascular, Respiratory, Psychiatric, Allergic/Imm, Heme/Lymph Last edited by Etheleen Mayhew, COT on 08/13/2023  8:03 AM.     ALLERGIES Allergies  Allergen Reactions   Lisinopril Other (See Comments) and Hives    Other reaction(s): Electrocardiogram abnormal  Other Reaction(s): Electrocardiogram abnormal, Abdominal pain, Electrocardiogram abnormal, Abdominal pain   Sildenafil Nausea And Vomiting   PAST MEDICAL HISTORY Past Medical History:  Diagnosis Date   Arthritis    Diabetes mellitus    Fistula, perirectal    GERD (gastroesophageal reflux disease)    Hypertension    Osteomyelitis of great toe of right foot (HCC) 10/31/2021   Polymicrobial bacterial infection 10/31/2021   PTSD (post-traumatic stress disorder)    PTSD (post-traumatic stress disorder) 10/31/2021   Small bowel obstruction (HCC)    Past Surgical History:  Procedure Laterality Date   RECTAL SURGERY     FAMILY HISTORY Family History  Problem Relation Age of Onset   Heart attack Father    Obesity Father    SOCIAL HISTORY Social History   Tobacco Use   Smoking status: Some Days    Types: Cigars   Smokeless tobacco: Never   Tobacco comments:    Occasional cigar  Vaping Use   Vaping status: Never Used  Substance Use Topics   Alcohol use: Yes    Comment: occ   Drug use: No       OPHTHALMIC EXAM:  Base Eye Exam     Visual Acuity (Snellen - Linear)       Right Left   Dist cc 20/80 20/25   Dist ph cc NI NI    Correction: Glasses         Tonometry (Tonopen, 8:10 AM)       Right Left   Pressure 12 14         Pupils       Pupils Dark Light Shape React APD   Right PERRL 2 2 Round NR None   Left PERRL 2 2 Round NR None         Visual Fields       Left Right    Full Full         Extraocular Movement       Right Left    Full, Ortho Full, Ortho          Neuro/Psych     Oriented x3: Yes   Mood/Affect: Normal         Dilation     Both eyes: 2.5% Phenylephrine @ 8:10 AM           Slit Lamp and Fundus Exam     Slit  Lamp Exam       Right Left   Lids/Lashes Dermatochalasis - upper lid Dermatochalasis - upper lid   Conjunctiva/Sclera mild melanosis, nasal pingeucula mild melanosis, nasal and temporal pinguecula   Cornea arcus, tear film debris arcus, 1+2+ fine PEE, mild tear film debris   Anterior Chamber deep, clear, narrow temporal angle Deep and quiet   Iris Round and dilated, No NVI Round and dilated, No NVI   Lens 3+ Nuclear sclerosis with brunescence, 2-3+ Cortical cataract 3+ Nuclear sclerosis with brunescence, 3+ Cortical cataract   Anterior Vitreous Vitreous syneresis, blood stained vitreous condensations below disc -- improved Vitreous syneresis         Fundus Exam       Right Left   Disc Pink and Sharp, no NVD, Compact, +heme at 0730 -- improved / resolved Pink and Sharp, no NVD, mild PPA   C/D Ratio 0.2 0.2   Macula Flat, good foveal reflex, RPE mottling and clumping, scattered MA, no edema, scattered CWSs Blunted foveal reflex, cluster of DBH and cystic changes temporal fovea -- improved, scattered DBH and CWS   Vessels attenuated, Tortuous attenuated, copper wiring, mild tortuosity   Periphery Attached, scattered DBH and CWS posteriorly, light PRP changes 360 Attached, scattered DBH and CWS posteriorly           Refraction     Wearing Rx       Sphere Cylinder Axis Add   Right +0.75   +2.50   Left +1.75 +0.50 170 +2.50           IMAGING AND PROCEDURES  Imaging and Procedures for 08/13/2023  OCT, Retina - OU - Both Eyes       Right Eye Quality was good. Central Foveal Thickness: 230. Progression has improved. Findings include normal foveal contour, no IRF, no SRF, outer retinal atrophy, vitreomacular adhesion (central ellipsoid signal disruptions and thinning -- improved; vitreous opacities --  mproved; irregular lamination, mild scattered IRHM).   Left Eye Quality was good. Central Foveal Thickness: 260. Progression has been stable. Findings include normal foveal contour, no SRF, intraretinal hyper-reflective material, intraretinal fluid (Stable improvement in IRF / edema temporal macula ).   Notes *Images captured and stored on drive  Diagnosis / Impression:  OD: central ellipsoid signal disruptions and thinning -- improved; vitreous opacities -- improved; irregular lamination, mild scattered IRHM OS: stable improvement in IRF / edema temporal macula   Clinical management:  See below  Abbreviations: NFP - Normal foveal profile. CME - cystoid macular edema. PED - pigment epithelial detachment. IRF - intraretinal fluid. SRF - subretinal fluid. EZ - ellipsoid zone. ERM - epiretinal membrane. ORA - outer retinal atrophy. ORT - outer retinal tubulation. SRHM - subretinal hyper-reflective material. IRHM - intraretinal hyper-reflective material      Intravitreal Injection, Pharmacologic Agent - OD - Right Eye       Time Out 08/13/2023. 8:59 AM. Confirmed correct patient, procedure, site, and patient consented.   Anesthesia Topical anesthesia was used. Anesthetic medications included Lidocaine 2%, Proparacaine 0.5%.   Procedure Preparation included 5% betadine to ocular surface, eyelid speculum. A (32g) needle was used.   Injection: 1.25 mg Bevacizumab 1.25mg /0.4ml   Route: Intravitreal, Site: Right Eye   NDC: P3213405, Lot: [id number], Expiration date: 09/17/2023   Post-op Post injection exam found visual acuity of at least counting fingers. The patient tolerated the procedure well. There were no complications. The patient received written and verbal post procedure care education.  Intravitreal Injection, Pharmacologic Agent - OS - Left Eye       Time Out 08/13/2023. 8:59 AM. Confirmed correct patient, procedure, site, and patient consented.    Anesthesia Topical anesthesia was used. Anesthetic medications included Lidocaine 2%, Proparacaine 0.5%.   Procedure Preparation included 5% betadine to ocular surface, eyelid speculum. A (33g) needle was used.   Injection: 1.25 mg Bevacizumab 1.25mg /0.25ml   Route: Intravitreal, Site: Left Eye   NDC: P3213405, Lot: [id number], Expiration date: 09/19/2023   Post-op Post injection exam found visual acuity of at least counting fingers. The patient tolerated the procedure well. There were no complications. The patient received written and verbal post procedure care education. Post injection medications were not given.            ASSESSMENT/PLAN:    ICD-10-CM   1. 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 Vitreous hemorrhage of right eye (HCC)  H43.11     3. 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 Long-term (current) use of injectable non-insulin antidiabetic drugs  Z79.85     5. Essential hypertension  I10     6. Hypertensive retinopathy of both eyes  H35.033     7. Retinal ischemia  H35.82     8. Combined forms of age-related cataract of both eyes  H25.813      1-4.  Proliferative diabetic retinopathy OD, Severe non-proliferative diabetic retinopathy, OS  - A1c: 8.4 on 03.28.24  - pt lost to follow up from 06.16.23 to 11.14.23 - pt reports recent improvement in A1c from 11 down to 6 since starting Ozempic - s/p IVA OS #1 (03.22.23), #2 (04.21.23), #3 (05.19.23), #4 (06.16.23), #5 (11.15.23), #6 (12.22.24), # 7 (01.26.24), #8 (03.15.24), #9 (05.10.24), #10  (06.28.24), #11 (08.12.24), #12 (10.16.24), #13 (12.17.24) - s/p IVA OD #1 (11.15.23), #2 (12.22.23), #3 (01.26.24), #4 (03.15.24), #5 (05.10.24), #6 (06.28.24), #7 (12.17.24) - s/p PRP OD (04.22.24) - exam shows scattered MA, DBH and CWS OU, mild VH OD and central cystic changes OS -- improving - new VH OD noted on 11.14.23 visit  - FA (11.14.23) OD: Enlarged FAZ, scattered patches of vascular non-perfusion greatest temporal periphery, mild perivascular leakage temporal periphery and from MA, focal early NVE superonasal midzone; OS: Mild enlargement of FAZ, scattered patches of vascular non-perfusion greatest temporal periphery, mild perivascular leakage temporal periphery and from MA; no NV -- pt would benefit from PRP OD -- completed 04.22.24 - BCVA OD improved to 20/80 from 20/400 (severe retinal ischemia + cataract); OS 20/25  - OCT shows OD: central ellipsoid signal disruptions and thinning -- improved; vitreous opacities -- improved; irregular lamination, mild scattered IRHM; OS: stable improvement in IRF / edema temporal macula at 8 weeks - recommend IVA OU (OD #8 and OS #14) today, 02.11.25 w/ f/u at 8 wks again - pt in agreement - RBA of procedure discussed, questions answered - IVA informed consent obtained and signed, 11.16.23 (OU) - see procedure note - f/u 8 weeks - DFE, OCT, possible injxns  5-7. Hypertensive retinopathy w/ retinal ischemia OU - BP uncontrolled  at initial consult -- 170s / 110-120s in office 12.23.22, BP was significantly improved to 147/80 at f/u visit - FA 12.23.22 shows significant retinal ischemia OU -- enlarged FAZ OU (OD> OS) and significant patches of capillary drop out OU (OD>OS)  8. Mixed Cataract OU - The symptoms of cataract, surgical options, and treatments and risks were discussed with patient. - discussed diagnosis and progression - visually significant  - referred to [facility] / Dr. Sherrine [name] for cat eval and establishment of primary eye care -- appt  tomorrow  Ophthalmic Meds Ordered this visit:  Meds ordered this encounter  Medications   Bevacizumab (AVASTIN) SOLN 1.25 mg   Bevacizumab (AVASTIN) SOLN 1.25 mg     Return in about 8 weeks (around 10/08/2023) for f/u PDR OU, DFE, OCT.  There are no Patient Instructions on file for this visit.  This document serves as a record of services personally performed by Karie Chimera, MD, PhD. It was created on their behalf by Charlette Caffey, COT an ophthalmic technician. The creation of this record is the provider's dictation and/or activities during the visit.    Electronically signed by:  Charlette Caffey, COT  08/13/23 12:42 PM  This document serves as a record of services personally performed by Karie Chimera, MD, PhD. It was created on their behalf by Glee Arvin. Manson Passey, OA an ophthalmic technician. The creation of this record is the provider's dictation and/or activities during the visit.    Electronically signed by: Glee Arvin. Manson Passey, OA 08/13/23 12:42 PM  Karie Chimera, M.D., Ph.D. Diseases & Surgery of the Retina and Vitreous [facility]  I have reviewed the above documentation for accuracy and completeness, and I agree with the above. Karie Chimera, M.D., Ph.D. 08/13/23 12:45 PM   Abbreviations: M myopia (nearsighted); A astigmatism; H hyperopia (farsighted); P presbyopia; Mrx spectacle prescription;  CTL contact lenses; OD right eye; OS left eye; OU both eyes  XT exotropia; ET esotropia; PEK punctate epithelial keratitis; PEE punctate epithelial erosions; DES dry eye syndrome; MGD meibomian gland dysfunction; ATs artificial tears; PFAT's preservative free artificial tears; NSC nuclear sclerotic cataract; PSC posterior subcapsular cataract; ERM epi-retinal membrane; PVD posterior vitreous detachment; RD retinal detachment; DM diabetes mellitus; DR diabetic retinopathy; NPDR non-proliferative diabetic retinopathy; PDR proliferative diabetic retinopathy;  CSME clinically significant macular edema; DME diabetic macular edema; dbh dot blot hemorrhages; CWS cotton wool spot; POAG primary open angle glaucoma; C/D cup-to-disc ratio; HVF humphrey visual field; GVF goldmann visual field; OCT optical coherence tomography; IOP intraocular pressure; BRVO Branch retinal vein occlusion; CRVO central retinal vein occlusion; CRAO central retinal artery occlusion; BRAO branch retinal artery occlusion; RT retinal tear; SB scleral buckle; PPV pars plana vitrectomy; VH Vitreous hemorrhage; PRP panretinal laser photocoagulation; IVK intravitreal kenalog; VMT vitreomacular traction; MH Macular hole;  NVD neovascularization of the disc; NVE neovascularization elsewhere; AREDS age related eye disease study; ARMD age related macular degeneration; POAG primary open angle glaucoma; EBMD epithelial/anterior basement membrane dystrophy; ACIOL anterior chamber intraocular lens; IOL intraocular lens; PCIOL posterior chamber intraocular lens; Phaco/IOL phacoemulsification with intraocular lens placement; PRK photorefractive keratectomy; LASIK laser assisted in situ keratomileusis; HTN hypertension; DM diabetes mellitus; COPD chronic obstructive pulmonary disease

## 2023-08-05 ENCOUNTER — Emergency Department: Admit: 2023-08-06 | Payer: PRIVATE HEALTH INSURANCE | Primary: Internal Medicine

## 2023-08-05 DIAGNOSIS — R55 Syncope and collapse: Secondary | ICD-10-CM

## 2023-08-05 NOTE — ED Notes (Signed)
Orthostatic Vitals:      08/05/2023    10:02 PM   Orthostatic Vitals   Orthostatic B/P and Pulse? Yes   Blood Pressure Lying 148/88   Pulse Lying 80 PER MINUTE   Blood Pressure Sitting 140/82   Pulse Sitting 85 PER MINUTE   Blood Pressure Standing 130/73   Pulse Standing 96 PER MINUTE

## 2023-08-05 NOTE — ED Triage Notes (Signed)
Pt states he hit  his head on the counter standing up on Wednesday and fell to the floor after. Pt states since then he has blacked out 2-4 times. Pt states he is having dizziness that is coming and going. Pt denies any blood thinners. Pt states new onset SOB today. Pt states he has had previous head injuries In the [employer]. Pt alert and oriented in triage with steady gait.
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
## 2023-08-06 ENCOUNTER — Inpatient Hospital Stay
Admit: 2023-08-06 | Discharge: 2023-08-06 | Disposition: A | Discharge status: Home / Self Care | Payer: PRIVATE HEALTH INSURANCE | Attending: Emergency Medicine

## 2023-08-06 LAB — CBC WITH AUTO DIFFERENTIAL
Basophils %: 0.7 % (ref 0.0–2.0)
Basophils Absolute: 0.05 10*3/uL (ref 0.00–0.20)
Eosinophils %: 3.3 % (ref 0.5–7.8)
Eosinophils Absolute: 0.24 10*3/uL (ref 0.00–0.80)
Hematocrit: 40.5 % — ABNORMAL LOW (ref 41.1–50.3)
Hemoglobin: 14.8 g/dL (ref 13.6–17.2)
Immature Granulocytes %: 0.4 % (ref 0.0–5.0)
Immature Granulocytes Absolute: 0.03 10*3/uL (ref 0.0–0.5)
Lymphocytes %: 29.7 % (ref 13.0–44.0)
Lymphocytes Absolute: 2.16 10*3/uL (ref 0.50–4.60)
MCH: 31.8 pg (ref 26.1–32.9)
MCHC: 36.5 g/dL — ABNORMAL HIGH (ref 31.4–35.0)
MCV: 86.9 fL (ref 82.0–102.0)
MPV: 8.8 fL — ABNORMAL LOW (ref 9.4–12.3)
Monocytes %: 6.6 % (ref 4.0–12.0)
Monocytes Absolute: 0.48 10*3/uL (ref 0.10–1.30)
Neutrophils %: 59.3 % (ref 43.0–78.0)
Neutrophils Absolute: 4.31 10*3/uL (ref 1.70–8.20)
Platelets: 209 10*3/uL (ref 150–450)
RBC: 4.66 M/uL (ref 4.23–5.60)
RDW: 11.7 % — ABNORMAL LOW (ref 11.9–14.6)
WBC: 7.3 10*3/uL (ref 4.3–11.1)
nRBC: 0 10*3/uL (ref 0.0–0.2)

## 2023-08-06 LAB — MAGNESIUM: Magnesium: 1.9 mg/dL (ref 1.8–2.4)

## 2023-08-06 LAB — EKG 12-LEAD
Atrial Rate: 83 {beats}/min
P Axis: 66 degrees
P-R Interval: 172 ms
Q-T Interval: 360 ms
QRS Duration: 80 ms
QTc Calculation (Bazett): 423 ms
R Axis: 66 degrees
T Axis: 19 degrees
Ventricular Rate: 83 {beats}/min

## 2023-08-06 LAB — COMPREHENSIVE METABOLIC PANEL
ALT: 4 U/L — ABNORMAL LOW (ref 12–65)
AST: 16 U/L (ref 15–37)
Albumin/Globulin Ratio: 0.9 — ABNORMAL LOW (ref 1.0–1.9)
Albumin: 3.5 g/dL (ref 3.5–5.0)
Alk Phosphatase: 103 U/L (ref 40–129)
Anion Gap: 12 mmol/L (ref 7–16)
BUN: 19 mg/dL (ref 6–23)
CO2: 22 mmol/L (ref 20–29)
Calcium: 8.6 mg/dL — ABNORMAL LOW (ref 8.8–10.2)
Chloride: 95 mmol/L — ABNORMAL LOW (ref 98–107)
Creatinine: 1.01 mg/dL (ref 0.80–1.30)
Est, Glom Filt Rate: 87 mL/min/{1.73_m2} (ref >60)
Globulin: 3.7 g/dL — ABNORMAL HIGH (ref 2.3–3.5)
Glucose: 312 mg/dL — ABNORMAL HIGH (ref 65–100)
Potassium: 4.8 mmol/L (ref 3.5–5.1)
Sodium: 129 mmol/L — ABNORMAL LOW (ref 133–143)
Total Bilirubin: 0.3 mg/dL (ref 0.0–1.2)
Total Protein: 7.2 g/dL (ref 6.3–8.2)

## 2023-08-06 LAB — TROPONIN
Troponin T: 37.6 ng/L — ABNORMAL HIGH (ref 0–22)
Troponin T: 33.8 ng/L — ABNORMAL HIGH (ref 0–22)

## 2023-08-06 MED ORDER — LACTATED RINGERS IV BOLUS
INTRAVENOUS | Status: AC
Start: 2023-08-06 — End: 2023-08-05
  Administered 2023-08-06: 04:00:00 1000 mL via INTRAVENOUS

## 2023-08-06 MED ORDER — KETOROLAC TROMETHAMINE 15 MG/ML IJ SOLN
15 | Freq: Once | INTRAMUSCULAR | Status: AC
Start: 2023-08-06 — End: 2023-08-06
  Administered 2023-08-06: 05:00:00 15 mg via INTRAVENOUS

## 2023-08-06 MED ORDER — MUPIROCIN 2 % EX OINT
2 | CUTANEOUS | 0 refills | Status: AC
Start: 2023-08-06 — End: 2023-08-13

## 2023-08-06 MED ORDER — SODIUM CHLORIDE 0.9 % IV BOLUS
0.9 | INTRAVENOUS | Status: AC
Start: 2023-08-06 — End: 2023-08-06
  Administered 2023-08-06: 05:00:00 1000 mL via INTRAVENOUS

## 2023-08-06 MED FILL — KETOROLAC TROMETHAMINE 15 MG/ML IJ SOLN: 15 MG/ML | INTRAMUSCULAR | Qty: 1

## 2023-08-06 NOTE — Discharge Instructions (Signed)
Follow-up with your doctor within 1 week for recheck labs as your sodium was low today, likely related to your hydrochlorothiazide.  Use mupirocin for infected hair follicle.  Return for worsening or concerning symptoms.

## 2023-08-06 NOTE — ED Notes (Signed)
Orthostatic Vitals:      08/06/2023    12:40 AM   Orthostatic Vitals   Orthostatic B/P and Pulse? Yes   Blood Pressure Lying 170/95   Pulse Lying 86 PER MINUTE   Blood Pressure Sitting 180/98   Pulse Sitting 89 PER MINUTE   Blood Pressure Standing 182/99   Pulse Standing 93 PER MINUTE

## 2023-08-13 ENCOUNTER — Encounter (INDEPENDENT_AMBULATORY_CARE_PROVIDER_SITE_OTHER): Payer: Self-pay | Admitting: Ophthalmology

## 2023-08-13 ENCOUNTER — Ambulatory Visit (INDEPENDENT_AMBULATORY_CARE_PROVIDER_SITE_OTHER): Payer: No Typology Code available for payment source | Admitting: Ophthalmology

## 2023-08-13 DIAGNOSIS — H25813 Combined forms of age-related cataract, bilateral: Secondary | ICD-10-CM

## 2023-08-13 DIAGNOSIS — H4311 Vitreous hemorrhage, right eye: Secondary | ICD-10-CM | POA: Diagnosis not present

## 2023-08-13 DIAGNOSIS — E113412 Type 2 diabetes mellitus with severe nonproliferative diabetic retinopathy with macular edema, left eye: Secondary | ICD-10-CM

## 2023-08-13 DIAGNOSIS — I10 Essential (primary) hypertension: Secondary | ICD-10-CM

## 2023-08-13 DIAGNOSIS — E113511 Type 2 diabetes mellitus with proliferative diabetic retinopathy with macular edema, right eye: Secondary | ICD-10-CM | POA: Diagnosis not present

## 2023-08-13 DIAGNOSIS — H3582 Retinal ischemia: Secondary | ICD-10-CM

## 2023-08-13 DIAGNOSIS — Z7985 Long-term (current) use of injectable non-insulin antidiabetic drugs: Secondary | ICD-10-CM

## 2023-08-13 DIAGNOSIS — H35033 Hypertensive retinopathy, bilateral: Secondary | ICD-10-CM

## 2023-08-13 MED ORDER — BEVACIZUMAB CHEMO INJECTION 1.25MG/0.05ML SYRINGE FOR KALEIDOSCOPE
1.2500 mg | INTRAVITREAL | Status: AC | PRN
  Administered 2023-08-13: 1.25 mg via INTRAVITREAL

## 2023-08-15 ENCOUNTER — Encounter (HOSPITAL_BASED_OUTPATIENT_CLINIC_OR_DEPARTMENT_OTHER): Payer: Self-pay | Admitting: Emergency Medicine

## 2023-08-15 ENCOUNTER — Emergency Department (HOSPITAL_BASED_OUTPATIENT_CLINIC_OR_DEPARTMENT_OTHER)
Admission: EM | Admit: 2023-08-15 | Discharge: 2023-08-15 | Disposition: A | Discharge status: Home / Self Care | Payer: No Typology Code available for payment source | Attending: Emergency Medicine | Admitting: Emergency Medicine

## 2023-08-15 DIAGNOSIS — Z7984 Long term (current) use of oral hypoglycemic drugs: Secondary | ICD-10-CM | POA: Insufficient documentation

## 2023-08-15 DIAGNOSIS — L0201 Cutaneous abscess of face: Secondary | ICD-10-CM | POA: Diagnosis present

## 2023-08-15 DIAGNOSIS — I10 Essential (primary) hypertension: Secondary | ICD-10-CM | POA: Diagnosis not present

## 2023-08-15 DIAGNOSIS — Z79899 Other long term (current) drug therapy: Secondary | ICD-10-CM | POA: Diagnosis not present

## 2023-08-15 DIAGNOSIS — L03211 Cellulitis of face: Secondary | ICD-10-CM | POA: Insufficient documentation

## 2023-08-15 DIAGNOSIS — F17200 Nicotine dependence, unspecified, uncomplicated: Secondary | ICD-10-CM | POA: Insufficient documentation

## 2023-08-15 DIAGNOSIS — E119 Type 2 diabetes mellitus without complications: Secondary | ICD-10-CM | POA: Diagnosis not present

## 2023-08-15 MED ORDER — DOXYCYCLINE HYCLATE 100 MG PO CAPS: 100.0000 mg | ORAL_CAPSULE | Freq: Two times a day (BID) | ORAL | 0 refills | Status: AC

## 2023-08-15 MED ORDER — ACETAMINOPHEN 325 MG PO TABS
650.0000 mg | ORAL_TABLET | Freq: Once | ORAL | Status: AC
  Administered 2023-08-15: 650 mg via ORAL
  Filled 2023-08-15: qty 2

## 2023-08-15 MED ORDER — LIDOCAINE-EPINEPHRINE (PF) 2 %-1:200000 IJ SOLN
10.0000 mL | Freq: Once | INTRAMUSCULAR | Status: AC
  Administered 2023-08-15: 10 mL
  Filled 2023-08-15: qty 20
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
## 2023-08-15 NOTE — ED Provider Notes (Signed)
[facility] Provider Note   CSN: [id number] Arrival date & time: 08/15/23  1509     History  Chief Complaint  Patient presents with   Abscess    [name] is a 58 y.o. male with past medical history significant for tobacco use, hypertension, diabetes who presents with concern for abscess of forehead.  He hit his head on the counter last week, had a knot on the right frontal scalp.  He reports that he had a clear head CT, felt like he had an ingrown hair that friend pulled out on Saturday.  Now oozing pus.  He reports some headache on that side and tightness of skin.  Denies any nausea, vomiting, fever, chills.   Abscess      Home Medications Prior to Admission medications   Medication Sig Start Date End Date Taking? Authorizing Provider  doxycycline (VIBRAMYCIN) 100 MG capsule Take 1 capsule (100 mg total) by mouth 2 (two) times daily. 08/15/23  Yes Aden Youngman H, PA-C  AMLODIPINE BESYLATE PO Take 10 mg by mouth daily.    [provider]  Cholecalciferol 25 MCG (1000 UT) capsule Take by mouth.    [provider]  Docusate Sodium (DSS) 100 MG CAPS Take by mouth. 03/19/20   [provider]  escitalopram (LEXAPRO) 10 MG tablet Take 10 mg by mouth daily.    [provider]  furosemide (LASIX) 40 MG tablet Take 1 tablet (40 mg total) by mouth daily as needed. 09/27/22   Corky Crafts, MD  gabapentin (NEURONTIN) 800 MG tablet Take 800 mg by mouth QID.    [provider]  hydrOXYzine (VISTARIL) 25 MG capsule Take 25 mg by mouth 3 (three) times daily as needed. One tablet daily    [provider]  ibuprofen (ADVIL) 800 MG tablet Take 800 mg by mouth every 8 (eight) hours as needed.    [provider]  loratadine (CLARITIN) 10 MG tablet Take by mouth. 05/04/20   [provider]  losartan (COZAAR) 100 MG tablet Take 100 mg by mouth daily.    [provider]  methocarbamol (ROBAXIN) 750 MG tablet TAKE ONE TABLET BY MOUTH TWICE A DAY AS NEEDED FOR MUSCLE SPASM 05/27/22   [provider]  naloxone (NARCAN) nasal spray 4 mg/0.1 mL SPRAY 1 SPRAY INTO ONE NOSTRIL AS DIRECTED FOR OPIOID OVERDOSE - CALL 911 IMMEDIATELY, ADMINISTER DOSE, THEN TURN PERSON ON SIDE - IF NO RESPONSE IN 2-3 MINUTES OR PERSON RESPONDS BUT RELAPSES, REPEAT USING A NEW SPRAY DEVICE AND SPRAY INTO THE OTHER NOSTRIL 09/19/22   [provider]  naproxen (NAPROSYN) 500 MG tablet Take 1 tablet by mouth 2 (two) times daily. 04/21/22   [provider]  omeprazole (PRILOSEC) 40 MG capsule Take 40 mg by mouth daily.    [provider]  oxyCODONE-acetaminophen (PERCOCET) 10-325 MG tablet Take 1 tablet by mouth every 4 (four) hours as needed for pain. 1 q 6 hours and 1 at bedtime    [provider]  polyethylene glycol (MIRALAX / GLYCOLAX) 17 g packet Take 17 g by mouth daily. 03/19/20   Arby Barrette, MD  ranitidine (ZANTAC) 75 MG tablet Take by mouth. 01/08/14   [provider]  rosuvastatin (CRESTOR) 10 MG tablet Take 1 tablet (10 mg total) by mouth daily. 09/27/22   Corky Crafts, MD  Semaglutide (OZEMPIC, 1 MG/DOSE, [place]) Inject 1 mg into the skin once a week.  [provider]  sertraline (ZOLOFT) 25 MG tablet Take 25 mg by mouth daily. Per patient is taking 1/2 tablet daily    [provider]  spironolactone (ALDACTONE) 25 MG tablet Take 25 mg by mouth daily.    [provider]  testosterone cypionate (DEPOTESTOTERONE CYPIONATE) 100 MG/ML injection Inject 100 mg into the muscle once a week. 06/13/20   [provider]  triamcinolone cream (KENALOG) 0.1 % APPLY THIN LAYER TO AFFECTED AREA TWICE A DAY AS NEEDED FOR ECZEMA 06/16/22   [provider]  zolpidem (AMBIEN) 10 MG tablet 1 tablet at bedtime as needed Orally Once a day    [provider]  QUEtiapine (SEROQUEL)  25 MG tablet Take 25 mg by mouth at bedtime.  05/04/20  [provider]      Allergies    Lisinopril and Sildenafil    Review of Systems   Review of Systems  All other systems reviewed and are negative.   Physical Exam Updated Vital Signs BP (!) 154/83 (BP Location: Left Arm)   Pulse 83   Temp 98.1 F (36.7 C) (Oral)   Resp 16   Ht 5\' 9"  (1.753 m)   Wt 87.5 kg   SpO2 100%   BMI 28.50 kg/m  Physical Exam Vitals and nursing note reviewed.  Constitutional:      General: He is not in acute distress.    Appearance: Normal appearance.  HENT:     Head: Normocephalic and atraumatic.  Eyes:     General:        Right eye: No discharge.        Left eye: No discharge.     Comments: Mild periorbital swelling from nearby cellulitis but no proptosis, intact EOMs with no pain.  Cardiovascular:     Rate and Rhythm: Normal rate and regular rhythm.  Pulmonary:     Effort: Pulmonary effort is normal. No respiratory distress.  Musculoskeletal:        General: No deformity.  Skin:    General: Skin is warm and dry.     Comments: Abscess noted on forehead, cellulitis of surrounding skin  Neurological:     Mental Status: He is alert and oriented to person, place, and time.  Psychiatric:        Mood and Affect: Mood normal.        Behavior: Behavior normal.     ED Results / Procedures / Treatments   Labs (all labs ordered are listed, but only abnormal results are displayed) Labs Reviewed - No data to display  EKG None  Radiology No results found.  Procedures .Incision and Drainage  Date/Time: 08/15/2023 5:17 PM  Performed by: Olene Floss, PA-C Authorized by: Olene Floss, PA-C   Consent:    Consent obtained:  Verbal   Consent given by:  Patient   Risks, benefits, and alternatives were discussed: yes     Risks discussed:  Bleeding, incomplete drainage, infection and pain   Alternatives discussed:  No treatment Universal protocol:     Procedure explained and questions answered to patient or proxy's satisfaction: yes     Patient identity confirmed:  Verbally with patient Location:    Type:  Abscess   Size:  3cm   Location:  Head   Head location:  Face (right frontal forehead) Pre-procedure details:    Skin preparation:  Povidone-iodine Sedation:    Sedation type:  None Anesthesia:    Anesthesia method:  Local infiltration  Local anesthetic:  Lidocaine 1% w/o epi Procedure type:    Complexity:  Simple Procedure details:    Incision types:  Single straight   Incision depth:  Dermal   Wound management:  Probed and deloculated   Drainage:  Bloody and purulent   Drainage amount:  Moderate   Wound treatment:  Wound left open Post-procedure details:    Procedure completion:  Tolerated     Medications Ordered in ED Medications  lidocaine-EPINEPHrine (XYLOCAINE W/EPI) 2 %-1:200000 (PF) injection 10 mL (10 mLs Infiltration Given by Other 08/15/23 1645)  acetaminophen (TYLENOL) tablet 650 mg (650 mg Oral Given 08/15/23 1717)    ED Course/ Medical Decision Making/ A&P                                 Medical Decision Making Risk OTC drugs. Prescription drug management.   This patient is a 58 y.o. male who presents to the ED for concern of abscess to forehead after head injury a few days ago.   Differential diagnoses prior to evaluation: Cellulitis, abscess requiring drainage, low clinical suspicion for acute intracranial head injury, including intracranial hemorrhage, subarachnoid hemorrhage, epidural hematoma, subdural hematoma given he already had a clear CT.  Low clinical suspicion again for underlying skull fracture again secondary to recent CT.  Past Medical History / Social History / Additional history: Chart reviewed. Pertinent results include: Diabetes, history of osteomyelitis, hypertension  Physical Exam: Physical exam performed. The pertinent findings include: On exam patient with a around 3 cm  abscess of the forehead with focal pore, likely starting from an ingrown hair as patient suspected.  There is some surrounding cellulitis.  There is mild periorbital swelling on the right close to the abscess, but no evidence of proptosis, no extraocular movement pain, and EOMs intact throughout.  Medications / Treatment: Incision and drainage performed of abscess as described above, patient tolerated without difficulty, wound was rebandaged, we will discharge on doxycycline   Disposition: After consideration of the diagnostic results and the patients response to treatment, I feel that patient with abscess, some surrounding cellulitis, repaired as described above, placed on antibiotics, return precautions given.   emergency department workup does not suggest an emergent condition requiring admission or immediate intervention beyond what has been performed at this time. The plan is: as above. The patient is safe for discharge and has been instructed to return immediately for worsening symptoms, change in symptoms or any other concerns.  Final Clinical Impression(s) / ED Diagnoses Final diagnoses:  Abscess of face  Facial cellulitis    Rx / DC Orders ED Discharge Orders          Ordered    doxycycline (VIBRAMYCIN) 100 MG capsule  2 times daily        08/15/23 1715              Saree Krogh, [place], PA-C 08/15/23 1730    Glyn Ade, MD 08/15/23 2251

## 2023-08-15 NOTE — Discharge Instructions (Signed)
Please use Tylenol for pain.  You may use up to 1000 mg of Tylenol every 6 hours.  Not to exceed 4 g of Tylenol within 24 hours.  You can use your home pain medicine as prescribed. It does look like there is some tylenol in your home medication at 325mg  / dose, again do not take more than 1000mg  / six hours.  Please take the entire course of antibiotics I have prescribed. I recommend taking it on a full stomach.

## 2023-08-28 ENCOUNTER — Emergency Department (HOSPITAL_BASED_OUTPATIENT_CLINIC_OR_DEPARTMENT_OTHER)
Admission: EM | Admit: 2023-08-28 | Discharge: 2023-08-28 | Disposition: A | Discharge status: Home / Self Care | Payer: No Typology Code available for payment source | Attending: Emergency Medicine | Admitting: Emergency Medicine

## 2023-08-28 ENCOUNTER — Other Ambulatory Visit: Payer: Self-pay

## 2023-08-28 ENCOUNTER — Encounter (HOSPITAL_BASED_OUTPATIENT_CLINIC_OR_DEPARTMENT_OTHER): Payer: Self-pay | Admitting: Emergency Medicine

## 2023-08-28 DIAGNOSIS — R739 Hyperglycemia, unspecified: Secondary | ICD-10-CM

## 2023-08-28 DIAGNOSIS — Z79899 Other long term (current) drug therapy: Secondary | ICD-10-CM | POA: Insufficient documentation

## 2023-08-28 DIAGNOSIS — I10 Essential (primary) hypertension: Secondary | ICD-10-CM | POA: Insufficient documentation

## 2023-08-28 DIAGNOSIS — Z794 Long term (current) use of insulin: Secondary | ICD-10-CM | POA: Insufficient documentation

## 2023-08-28 DIAGNOSIS — Z7984 Long term (current) use of oral hypoglycemic drugs: Secondary | ICD-10-CM | POA: Diagnosis not present

## 2023-08-28 DIAGNOSIS — E1165 Type 2 diabetes mellitus with hyperglycemia: Secondary | ICD-10-CM | POA: Diagnosis present

## 2023-08-28 LAB — CBC
HCT: 39.7 % (ref 39.0–52.0)
Hemoglobin: 13.9 g/dL (ref 13.0–17.0)
MCH: 30.8 pg (ref 26.0–34.0)
MCHC: 35 g/dL (ref 30.0–36.0)
MCV: 87.8 fL (ref 80.0–100.0)
Platelets: 274 10*3/uL (ref 150–400)
RBC: 4.52 MIL/uL (ref 4.22–5.81)
RDW: 12.3 % (ref 11.5–15.5)
WBC: 5.7 10*3/uL (ref 4.0–10.5)
nRBC: 0 % (ref 0.0–0.2)

## 2023-08-28 LAB — BASIC METABOLIC PANEL
Anion gap: 9 (ref 5–15)
BUN: 22 mg/dL — ABNORMAL HIGH (ref 6–20)
CO2: 24 mmol/L (ref 22–32)
Calcium: 8.9 mg/dL (ref 8.9–10.3)
Chloride: 95 mmol/L — ABNORMAL LOW (ref 98–111)
Creatinine, Ser: 1.23 mg/dL (ref 0.61–1.24)
GFR, Estimated: >60 mL/min (ref >60)
Glucose, Bld: 504 mg/dL (ref 70–99)
Potassium: 4.4 mmol/L (ref 3.5–5.1)
Sodium: 128 mmol/L — ABNORMAL LOW (ref 135–145)

## 2023-08-28 LAB — CBG MONITORING, ED
Glucose-Capillary: 546 mg/dL (ref 70–99)
Glucose-Capillary: 406 mg/dL — ABNORMAL HIGH (ref 70–99)
Glucose-Capillary: 325 mg/dL — ABNORMAL HIGH (ref 70–99)

## 2023-08-28 MED ORDER — METFORMIN HCL 500 MG PO TABS
500.0000 mg | ORAL_TABLET | Freq: Once | ORAL | Status: AC
  Administered 2023-08-28: 500 mg via ORAL
  Filled 2023-08-28: qty 1

## 2023-08-28 MED ORDER — INSULIN ASPART 100 UNIT/ML IJ SOLN
4.0000 [IU] | Freq: Once | INTRAMUSCULAR | Status: AC
  Administered 2023-08-28: 4 [IU] via SUBCUTANEOUS

## 2023-08-28 MED ORDER — METFORMIN HCL 500 MG PO TABS: 500.0000 mg | ORAL_TABLET | Freq: Two times a day (BID) | ORAL | 0 refills | Status: DC

## 2023-08-28 MED ORDER — INSULIN ASPART 100 UNIT/ML IJ SOLN
8.0000 [IU] | Freq: Once | INTRAMUSCULAR | Status: AC
  Administered 2023-08-28: 8 [IU] via SUBCUTANEOUS

## 2023-08-28 NOTE — ED Provider Notes (Signed)
  EMERGENCY DEPARTMENT AT [facility] Provider Note   CSN: [id number] Arrival date & time: 08/28/23  0153     History  Chief Complaint  Patient presents with   Hyperglycemia    Luisantonio Adinolfi is a 58 y.o. male.  The history is provided by the patient.  Hyperglycemia Blood sugar level PTA:  High Onset quality:  Gradual Duration:  3 weeks Timing:  Constant Progression:  Unchanged Chronicity:  Recurrent Current diabetic treatments: ozempic and jardiance. Context comment:  Reports being on steroids 3 weeks ago Relieved by:  Nothing Associated symptoms: polyuria   Associated symptoms: no fever   Patient on Ozempic and jardiance seen yesterday in [place].  Was on steroids and does enjoy [employer].      Past Medical History:  Diagnosis Date   Arthritis    Diabetes mellitus    Fistula, perirectal    GERD (gastroesophageal reflux disease)    Hypertension    Osteomyelitis of great toe of right foot (HCC) 10/31/2021   Polymicrobial bacterial infection 10/31/2021   PTSD (post-traumatic stress disorder)    PTSD (post-traumatic stress disorder) 10/31/2021   Small bowel obstruction (HCC)      Home Medications Prior to Admission medications   Medication Sig Start Date End Date Taking? Authorizing Provider  metFORMIN (GLUCOPHAGE) 500 MG tablet Take 1 tablet (500 mg total) by mouth 2 (two) times daily with a meal. 08/28/23  Yes Lesleyann Fichter, MD  AMLODIPINE BESYLATE PO Take 10 mg by mouth daily.    [provider]  Cholecalciferol 25 MCG (1000 UT) capsule Take by mouth.    [provider]  Docusate Sodium (DSS) 100 MG CAPS Take by mouth. 03/19/20   [provider]  doxycycline (VIBRAMYCIN) 100 MG capsule Take 1 capsule (100 mg total) by mouth 2 (two) times daily. 08/15/23   Prosperi, Christian H, PA-C  escitalopram (LEXAPRO) 10 MG tablet Take 10 mg by mouth daily.    [provider]  furosemide (LASIX) 40 MG tablet Take 1 tablet (40  mg total) by mouth daily as needed. 09/27/22   Corky Crafts, MD  gabapentin (NEURONTIN) 800 MG tablet Take 800 mg by mouth QID.    [provider]  hydrOXYzine (VISTARIL) 25 MG capsule Take 25 mg by mouth 3 (three) times daily as needed. One tablet daily    [provider]  ibuprofen (ADVIL) 800 MG tablet Take 800 mg by mouth every 8 (eight) hours as needed.    [provider]  loratadine (CLARITIN) 10 MG tablet Take by mouth. 05/04/20   [provider]  losartan (COZAAR) 100 MG tablet Take 100 mg by mouth daily.    [provider]  methocarbamol (ROBAXIN) 750 MG tablet TAKE ONE TABLET BY MOUTH TWICE A DAY AS NEEDED FOR MUSCLE SPASM 05/27/22   [provider]  naloxone (NARCAN) nasal spray 4 mg/0.1 mL SPRAY 1 SPRAY INTO ONE NOSTRIL AS DIRECTED FOR OPIOID OVERDOSE - CALL 911 IMMEDIATELY, ADMINISTER DOSE, THEN TURN PERSON ON SIDE - IF NO RESPONSE IN 2-3 MINUTES OR PERSON RESPONDS BUT RELAPSES, REPEAT USING A NEW SPRAY DEVICE AND SPRAY INTO THE OTHER NOSTRIL 09/19/22   [provider]  naproxen (NAPROSYN) 500 MG tablet Take 1 tablet by mouth 2 (two) times daily. 04/21/22   [provider]  omeprazole (PRILOSEC) 40 MG capsule Take 40 mg by mouth daily.    [provider]  oxyCODONE-acetaminophen (PERCOCET) 10-325 MG tablet Take 1 tablet by  mouth every 4 (four) hours as needed for pain. 1 q 6 hours and 1 at bedtime    [provider]  polyethylene glycol (MIRALAX / GLYCOLAX) 17 g packet Take 17 g by mouth daily. 03/19/20   Arby Barrette, MD  ranitidine (ZANTAC) 75 MG tablet Take by mouth. 01/08/14   [provider]  rosuvastatin (CRESTOR) 10 MG tablet Take 1 tablet (10 mg total) by mouth daily. 09/27/22   Corky Crafts, MD  Semaglutide (OZEMPIC, 1 MG/DOSE, [place]) Inject 1 mg into the skin once a week.    [provider]  sertraline (ZOLOFT) 25 MG tablet Take 25 mg by mouth daily. Per patient  is taking 1/2 tablet daily    [provider]  spironolactone (ALDACTONE) 25 MG tablet Take 25 mg by mouth daily.    [provider]  testosterone cypionate (DEPOTESTOTERONE CYPIONATE) 100 MG/ML injection Inject 100 mg into the muscle once a week. 06/13/20   [provider]  triamcinolone cream (KENALOG) 0.1 % APPLY THIN LAYER TO AFFECTED AREA TWICE A DAY AS NEEDED FOR ECZEMA 06/16/22   [provider]  zolpidem (AMBIEN) 10 MG tablet 1 tablet at bedtime as needed Orally Once a day    [provider]  QUEtiapine (SEROQUEL) 25 MG tablet Take 25 mg by mouth at bedtime.  05/04/20  [provider]      Allergies    Lisinopril and Sildenafil    Review of Systems   Review of Systems  Constitutional:  Negative for fever.  Eyes:  Negative for visual disturbance.  Respiratory:  Negative for wheezing and stridor.   Endocrine: Positive for polyuria.  All other systems reviewed and are negative.   Physical Exam Updated Vital Signs BP (!) 160/95   Pulse 98   Temp 98 F (36.7 C) (Oral)   Resp 20   SpO2 98%  Physical Exam Vitals and nursing note reviewed.  Constitutional:      General: He is not in acute distress.    Appearance: He is well-developed. He is not diaphoretic.  HENT:     Head: Normocephalic and atraumatic.     Nose: Nose normal.  Eyes:     Conjunctiva/sclera: Conjunctivae normal.     Pupils: Pupils are equal, round, and reactive to light.  Cardiovascular:     Rate and Rhythm: Normal rate and regular rhythm.     Pulses: Normal pulses.     Heart sounds: Normal heart sounds.  Pulmonary:     Effort: Pulmonary effort is normal.     Breath sounds: Normal breath sounds. No wheezing or rales.  Abdominal:     General: Bowel sounds are normal.     Palpations: Abdomen is soft.     Tenderness: There is no abdominal tenderness. There is no guarding or rebound.  Musculoskeletal:        General: Normal range of motion.      Cervical back: Normal range of motion and neck supple.  Skin:    General: Skin is warm and dry.     Capillary Refill: Capillary refill takes less than 2 seconds.  Neurological:     General: No focal deficit present.     Mental Status: He is alert and oriented to person, place, and time.     Deep Tendon Reflexes: Reflexes normal.  Psychiatric:        Mood and Affect: Mood normal.        Behavior: Behavior normal.  ED Results / Procedures / Treatments   Labs (all labs ordered are listed, but only abnormal results are displayed) Results for orders placed or performed during the hospital encounter of 08/28/23  CBG monitoring, ED   Collection Time: 08/28/23  2:00 AM  Result Value Ref Range   Glucose-Capillary 546 (HH) 70 - 99 mg/dL   Comment 1 Call MD NNP PA CNM   Basic metabolic panel   Collection Time: 08/28/23  2:03 AM  Result Value Ref Range   Sodium 128 (L) 135 - 145 mmol/L   Potassium 4.4 3.5 - 5.1 mmol/L   Chloride 95 (L) 98 - 111 mmol/L   CO2 24 22 - 32 mmol/L   Glucose, Bld 504 (HH) 70 - 99 mg/dL   BUN 22 (H) 6 - 20 mg/dL   Creatinine, Ser 8.29 0.61 - 1.24 mg/dL   Calcium 8.9 8.9 - 56.2 mg/dL   GFR, Estimated >13 >08 mL/min   Anion gap 9 5 - 15  CBC   Collection Time: 08/28/23  2:03 AM  Result Value Ref Range   WBC 5.7 4.0 - 10.5 K/uL   RBC 4.52 4.22 - 5.81 MIL/uL   Hemoglobin 13.9 13.0 - 17.0 g/dL   HCT 65.7 84.6 - 96.2 %   MCV 87.8 80.0 - 100.0 fL   MCH 30.8 26.0 - 34.0 pg   MCHC 35.0 30.0 - 36.0 g/dL   RDW 95.2 84.1 - 32.4 %   Platelets 274 150 - 400 K/uL   nRBC 0.0 0.0 - 0.2 %  CBG monitoring, ED   Collection Time: 08/28/23  4:39 AM  Result Value Ref Range   Glucose-Capillary 406 (H) 70 - 99 mg/dL   Comment 1 Call MD NNP PA CNM   CBG monitoring, ED   Collection Time: 08/28/23  5:37 AM  Result Value Ref Range   Glucose-Capillary 325 (H) 70 - 99 mg/dL   Comment 1 Call MD NNP PA CNM    Intravitreal Injection, Pharmacologic Agent - OD - Right  Eye Result Date: 08/13/2023 Time Out 08/13/2023. 8:59 AM. Confirmed correct patient, procedure, site, and patient consented. Anesthesia Topical anesthesia was used. Anesthetic medications included Lidocaine 2%, Proparacaine 0.5%. Procedure Preparation included 5% betadine to ocular surface, eyelid speculum. A (32g) needle was used. Injection: 1.25 mg Bevacizumab 1.25mg /0.33ml   Route: Intravitreal, Site: Right Eye   NDC: P3213405, Lot: [id number], Expiration date: 09/17/2023 Post-op Post injection exam found visual acuity of at least counting fingers. The patient tolerated the procedure well. There were no complications. The patient received written and verbal post procedure care education.   Intravitreal Injection, Pharmacologic Agent - OS - Left Eye Result Date: 08/13/2023 Time Out 08/13/2023. 8:59 AM. Confirmed correct patient, procedure, site, and patient consented. Anesthesia Topical anesthesia was used. Anesthetic medications included Lidocaine 2%, Proparacaine 0.5%. Procedure Preparation included 5% betadine to ocular surface, eyelid speculum. A (33g) needle was used. Injection: 1.25 mg Bevacizumab 1.25mg /0.53ml   Route: Intravitreal, Site: Left Eye   NDC: P3213405, Lot: [id number], Expiration date: 09/19/2023 Post-op Post injection exam found visual acuity of at least counting fingers. The patient tolerated the procedure well. There were no complications. The patient received written and verbal post procedure care education. Post injection medications were not given.   OCT, Retina - OU - Both Eyes Result Date: 08/13/2023 Right Eye Quality was good. Central Foveal Thickness: 230. Progression has improved. Findings include normal foveal contour, no IRF, no SRF, outer retinal atrophy, vitreomacular adhesion (central ellipsoid signal disruptions  and thinning -- improved; vitreous opacities -- mproved; irregular lamination, mild scattered IRHM). Left Eye Quality was good. Central Foveal Thickness: 260.  Progression has been stable. Findings include normal foveal contour, no SRF, intraretinal hyper-reflective material, intraretinal fluid (Stable improvement in IRF / edema temporal macula ). Notes *Images captured and stored on drive Diagnosis / Impression: OD: central ellipsoid signal disruptions and thinning -- improved; vitreous opacities -- improved; irregular lamination, mild scattered IRHM OS: stable improvement in IRF / edema temporal macula Clinical management: See below Abbreviations: NFP - Normal foveal profile. CME - cystoid macular edema. PED - pigment epithelial detachment. IRF - intraretinal fluid. SRF - subretinal fluid. EZ - ellipsoid zone. ERM - epiretinal membrane. ORA - outer retinal atrophy. ORT - outer retinal tubulation. SRHM - subretinal hyper-reflective material. IRHM - intraretinal hyper-reflective material    Radiology No results found.  Procedures Procedures    Medications Ordered in ED Medications  insulin aspart (novoLOG) injection 8 Units (8 Units Subcutaneous Given 08/28/23 0406)  metFORMIN (GLUCOPHAGE) tablet 500 mg (500 mg Oral Given 08/28/23 0408)  insulin aspart (novoLOG) injection 4 Units (4 Units Subcutaneous Given 08/28/23 0458)    ED Course/ Medical Decision Making/ A&P                                 Medical Decision Making Patient with DM on jardiance and ozempic presents with ongoing hyperglycemia   Amount and/or Complexity of Data Reviewed External Data Reviewed: labs and notes.    Details: Previous notes and labs reviewed  Labs: ordered.    Details: Glucose 546 markedly elevated, sodium 128 which corrects to normal for glucose normal potassium, normal creatinine 1.23.  Glucose then 504, then 406 post insulin then 325 post insulin ,   Risk Prescription drug management. Risk Details: Normal anion gap.  I will start metformin.  No sugar in the diet.  Follow up with PMD for medication change or adjustment.  Stable for discharge.      Final  Clinical Impression(s) / ED Diagnoses Final diagnoses:  Hyperglycemia   Return for intractable cough, coughing up blood, fevers > 100.4  I have reviewed the triage vital signs and the nursing notes. Pertinent labs & imaging results that were available during my care of the patient were reviewed by me and considered in my medical decision making (see chart for details). After history, exam, and medical workup I feel the patient has been appropriately medically screened and is safe for discharge home. Pertinent diagnoses were discussed with the patient. Patient was given return precautions.  Rx / DC Orders ED Discharge Orders          Ordered    metFORMIN (GLUCOPHAGE) 500 MG tablet  2 times daily with meals        08/28/23 0516              Donovyn Guidice, MD 08/28/23 1610

## 2023-08-28 NOTE — ED Notes (Signed)
 ED Provider at bedside.

## 2023-08-28 NOTE — ED Triage Notes (Signed)
 Patient presents with hyperglycemia. Reports glucometer reading "high" at home. Patient also reports increased thirst and urination. Hx Diabetes
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
## 2023-10-07 ENCOUNTER — Telehealth: Payer: Self-pay | Admitting: Podiatry

## 2023-10-07 NOTE — Telephone Encounter (Signed)
 See mess lft giving ph# for medical record requests

## 2023-10-08 ENCOUNTER — Encounter (INDEPENDENT_AMBULATORY_CARE_PROVIDER_SITE_OTHER): Payer: No Typology Code available for payment source | Admitting: Ophthalmology

## 2023-10-08 DIAGNOSIS — Z7985 Long-term (current) use of injectable non-insulin antidiabetic drugs: Secondary | ICD-10-CM

## 2023-10-08 DIAGNOSIS — H3582 Retinal ischemia: Secondary | ICD-10-CM

## 2023-10-08 DIAGNOSIS — I10 Essential (primary) hypertension: Secondary | ICD-10-CM

## 2023-10-08 DIAGNOSIS — H35033 Hypertensive retinopathy, bilateral: Secondary | ICD-10-CM

## 2023-10-08 DIAGNOSIS — H4311 Vitreous hemorrhage, right eye: Secondary | ICD-10-CM

## 2023-10-08 DIAGNOSIS — E113511 Type 2 diabetes mellitus with proliferative diabetic retinopathy with macular edema, right eye: Secondary | ICD-10-CM

## 2023-10-08 DIAGNOSIS — E113412 Type 2 diabetes mellitus with severe nonproliferative diabetic retinopathy with macular edema, left eye: Secondary | ICD-10-CM

## 2023-10-08 DIAGNOSIS — H25813 Combined forms of age-related cataract, bilateral: Secondary | ICD-10-CM

## 2023-10-10 NOTE — Progress Notes (Signed)
 [facility] - Clinic Note  10/11/2023     CHIEF COMPLAINT Patient presents for Retina Follow Up   HISTORY OF PRESENT ILLNESS: [name] is a 58 y.o. male who presents to the clinic today for:   HPI     Retina Follow Up   Patient presents with  Diabetic Retinopathy.  In both eyes.  This started 8 weeks ago.  Duration of 8 weeks.  Since onset it is stable.  I, the attending physician,  performed the HPI with the patient and updated documentation appropriately.        Comments   8 week retina follow up PRD OU pt is reporting that he has been seeing floater in his left eye and seeing blood spots things cloudy as well last reading 116 A1C 8.5      Last edited by Ronelle Coffee, MD on 10/11/2023 12:16 PM.    Pt states early Wednesday morning, he started seeing floaters / spider webs in his left eye, he states his blood sugar has gone from 400 down to 160 down to 95, he states he doesn't have BP spikes, pt saw Dr. Allison Ivory about his cataracts, but [facility], [facility] are going [facility] him somewhere else  Referring physician: Clinic, [facility] [address] [facility] [place],  [place] [address]  HISTORICAL INFORMATION:   Selected notes from the MEDICAL RECORD NUMBER Referred by [facility] for retinal hemes LEE:  Ocular Hx- PMH-    CURRENT MEDICATIONS: No current outpatient medications on file. (Ophthalmic Drugs)   No current facility-administered medications for this visit. (Ophthalmic Drugs)   Current Outpatient Medications (Other)  Medication Sig   AMLODIPINE BESYLATE PO Take 10 mg by mouth daily.   Cholecalciferol 25 MCG (1000 UT) capsule Take by mouth.   Docusate Sodium (DSS) 100 MG CAPS Take by mouth.   doxycycline (VIBRAMYCIN) 100 MG capsule Take 1 capsule (100 mg total) by mouth 2 (two) times daily.   escitalopram (LEXAPRO) 10 MG tablet Take 10 mg by mouth daily.   furosemide (LASIX) 40 MG tablet Take 1 tablet (40 mg  total) by mouth daily as needed.   gabapentin (NEURONTIN) 800 MG tablet Take 800 mg by mouth QID.   hydrOXYzine (VISTARIL) 25 MG capsule Take 25 mg by mouth 3 (three) times daily as needed. One tablet daily   ibuprofen (ADVIL) 800 MG tablet Take 800 mg by mouth every 8 (eight) hours as needed.   loratadine (CLARITIN) 10 MG tablet Take by mouth.   losartan (COZAAR) 100 MG tablet Take 100 mg by mouth daily.   metFORMIN (GLUCOPHAGE) 500 MG tablet Take 1 tablet (500 mg total) by mouth 2 (two) times daily with a meal.   methocarbamol (ROBAXIN) 750 MG tablet TAKE ONE TABLET BY MOUTH TWICE A DAY AS NEEDED FOR MUSCLE SPASM   naloxone (NARCAN) nasal spray 4 mg/0.1 mL SPRAY 1 SPRAY INTO ONE NOSTRIL AS DIRECTED FOR OPIOID OVERDOSE - CALL 911 IMMEDIATELY, ADMINISTER DOSE, THEN TURN PERSON ON SIDE - IF NO RESPONSE IN 2-3 MINUTES OR PERSON RESPONDS BUT RELAPSES, REPEAT USING A NEW SPRAY DEVICE AND SPRAY INTO THE OTHER NOSTRIL   naproxen (NAPROSYN) 500 MG tablet Take 1 tablet by mouth 2 (two) times daily.   omeprazole (PRILOSEC) 40 MG capsule Take 40 mg by mouth daily.   oxyCODONE-acetaminophen (PERCOCET) 10-325 MG tablet Take 1 tablet by mouth every 4 (four) hours as needed for pain. 1 q 6  hours and 1 at bedtime   polyethylene glycol (MIRALAX / GLYCOLAX) 17 g packet Take 17 g by mouth daily.   ranitidine (ZANTAC) 75 MG tablet Take by mouth.   rosuvastatin (CRESTOR) 10 MG tablet Take 1 tablet (10 mg total) by mouth daily.   Semaglutide (OZEMPIC, 1 MG/DOSE, [place]) Inject 1 mg into the skin once a week.   sertraline (ZOLOFT) 25 MG tablet Take 25 mg by mouth daily. Per patient is taking 1/2 tablet daily   spironolactone (ALDACTONE) 25 MG tablet Take 25 mg by mouth daily.   testosterone cypionate (DEPOTESTOTERONE CYPIONATE) 100 MG/ML injection Inject 100 mg into the muscle once a week.   triamcinolone cream (KENALOG) 0.1 % APPLY THIN LAYER TO AFFECTED AREA TWICE A DAY AS NEEDED FOR ECZEMA   zolpidem (AMBIEN) 10 MG  tablet 1 tablet at bedtime as needed Orally Once a day   No current facility-administered medications for this visit. (Other)   REVIEW OF SYSTEMS: ROS   Positive for: Endocrine, Eyes Negative for: Constitutional, Gastrointestinal, Neurological, Skin, Genitourinary, Musculoskeletal, HENT, Cardiovascular, Respiratory, Psychiatric, Allergic/Imm, Heme/Lymph Last edited by Alise Appl, COT on 10/11/2023  8:38 AM.      ALLERGIES Allergies  Allergen Reactions   Lisinopril Other (See Comments) and Hives    Other reaction(s): Electrocardiogram abnormal  Other Reaction(s): Electrocardiogram abnormal, Abdominal pain, Electrocardiogram abnormal, Abdominal pain   Sildenafil Nausea And Vomiting   PAST MEDICAL HISTORY Past Medical History:  Diagnosis Date   Arthritis    Diabetes mellitus    Fistula, perirectal    GERD (gastroesophageal reflux disease)    Hypertension    Osteomyelitis of great toe of right foot (HCC) 10/31/2021   Polymicrobial bacterial infection 10/31/2021   PTSD (post-traumatic stress disorder)    PTSD (post-traumatic stress disorder) 10/31/2021   Small bowel obstruction (HCC)    Past Surgical History:  Procedure Laterality Date   RECTAL SURGERY     FAMILY HISTORY Family History  Problem Relation Age of Onset   Heart attack Father    Obesity Father    SOCIAL HISTORY Social History   Tobacco Use   Smoking status: Some Days    Types: Cigars   Smokeless tobacco: Never   Tobacco comments:    Occasional cigar  Vaping Use   Vaping status: Never Used  Substance Use Topics   Alcohol use: Yes    Comment: occ   Drug use: No       OPHTHALMIC EXAM:  Base Eye Exam     Visual Acuity (Snellen - Linear)       Right Left   Dist cc 20/150 20/200   Dist ph cc NI 20/100         Tonometry (Tonopen, 8:46 AM)       Right Left   Pressure 12 14         Pupils       Pupils Dark Light Shape React APD   Right PERRL 2 2 Round Sluggish None   Left  PERRL 2 2 Round Sluggish None         Visual Fields       Left Right    Full Full         Extraocular Movement       Right Left    Full, Ortho Full, Ortho         Neuro/Psych     Oriented x3: Yes   Mood/Affect: Normal  Dilation     Both eyes: 2.5% Phenylephrine @ 8:44 AM           Slit Lamp and Fundus Exam     Slit Lamp Exam       Right Left   Lids/Lashes Dermatochalasis - upper lid Dermatochalasis - upper lid   Conjunctiva/Sclera mild melanosis, nasal pingeucula mild melanosis, nasal and temporal pinguecula   Cornea arcus, 2+ Punctate epithelial erosions arcus, 2+inferior PEE, mild tear film debris   Anterior Chamber deep, clear, narrow temporal angle Deep and quiet   Iris Round and dilated, No NVI Round and dilated, No NVI   Lens 3+ Nuclear sclerosis with brunescence, 2-3+ Cortical cataract 3+ Nuclear sclerosis with brunescence, 3+ Cortical cataract   Anterior Vitreous Vitreous syneresis, blood stained vitreous condensations below disc -- improved Vitreous syneresis, +RBCs, blood stained vitreous condensations         Fundus Exam       Right Left   Disc Pink and Sharp, no NVD, Compact hazy view, Pink and Sharp   C/D Ratio 0.2 0.2   Macula Flat, good foveal reflex, RPE mottling and clumping, scattered MA / DBH, no edema, scattered CWSs greatest superior mac hazy view, grossly attached   Vessels attenuated, Tortuous attenuated   Periphery Attached, scattered DBH and CWS posteriorly, light PRP changes 360 Attached, scattered DBH and CWS posteriorly, inferior retina obscured by [facility]           Refraction     Wearing Rx       Sphere Cylinder Axis Add   Right +0.75   +2.50   Left +1.75 +0.50 170 +2.50           IMAGING AND PROCEDURES  Imaging and Procedures for 10/11/2023  OCT, Retina - OU - Both Eyes       Right Eye Quality was good. Central Foveal Thickness: 224. Progression has been stable. Findings include normal foveal  contour, no IRF, no SRF, outer retinal atrophy, vitreomacular adhesion (central ellipsoid signal disruptions and thinning; vitreous opacities -- stably improved; irregular lamination, mild scattered IRHM).   Left Eye Quality was poor. Progression has worsened. Findings include normal foveal contour, no SRF, intraretinal hyper-reflective material, intraretinal fluid (Interval development of vitreous opacities / VH, retina grossly attached, no obvious IRF / edema).   Notes *Images captured and stored on drive  Diagnosis / Impression:  OD: central ellipsoid signal disruptions and thinning; vitreous opacities -- stably improved; irregular lamination, mild scattered IRHM OS: Interval development of vitreous opacities / VH, retina grossly attached, no obvious IRF / edema  Clinical management:  See below  Abbreviations: NFP - Normal foveal profile. CME - cystoid macular edema. PED - pigment epithelial detachment. IRF - intraretinal fluid. SRF - subretinal fluid. EZ - ellipsoid zone. ERM - epiretinal membrane. ORA - outer retinal atrophy. ORT - outer retinal tubulation. SRHM - subretinal hyper-reflective material. IRHM - intraretinal hyper-reflective material      Intravitreal Injection, Pharmacologic Agent - OD - Right Eye       Time Out 10/11/2023. 9:14 AM. Confirmed correct patient, procedure, site, and patient consented.   Anesthesia Topical anesthesia was used. Anesthetic medications included Lidocaine 2%, Proparacaine 0.5%.   Procedure Preparation included 5% betadine to ocular surface, eyelid speculum. A supplied (32g) needle was used.   Injection: 1.25 mg Bevacizumab 1.25mg /0.12ml   Route: Intravitreal, Site: Right Eye   NDC: 62952-841-32, Lot: [id number] , Expiration date: 11/09/2023   Post-op Post injection exam found visual acuity of at  least counting fingers. The patient tolerated the procedure well. There were no complications. The patient received written and verbal post  procedure care education.      Intravitreal Injection, Pharmacologic Agent - OS - Left Eye       Time Out 10/11/2023. 9:15 AM. Confirmed correct patient, procedure, site, and patient consented.   Anesthesia Topical anesthesia was used. Anesthetic medications included Lidocaine 2%, Proparacaine 0.5%.   Procedure Preparation included 5% betadine to ocular surface, eyelid speculum. A supplied (33g) needle was used.   Injection: 1.25 mg Bevacizumab 1.25mg /0.51ml   Route: Intravitreal, Site: Left Eye   NDC: C2662926, Lot: [id number], Expiration date: 11/16/2023   Post-op Post injection exam found visual acuity of at least counting fingers. The patient tolerated the procedure well. There were no complications. The patient received written and verbal post procedure care education. Post injection medications were not given.            ASSESSMENT/PLAN:    ICD-10-CM   1. 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 Vitreous hemorrhage of right eye (HCC)  H43.11     3. Severe nonproliferative diabetic retinopathy of left eye with macular edema associated with type 2 diabetes mellitus (HCC)  N56.2130     4. Long-term (current) use of injectable non-insulin antidiabetic drugs  Z79.85     5. Essential hypertension  I10     6. Hypertensive retinopathy of both eyes  H35.033     7. Retinal ischemia  H35.82     8. Combined forms of age-related cataract of both eyes  H25.813      1-4.  Proliferative diabetic retinopathy OD, Severe non-proliferative diabetic retinopathy, OS  ** New VH OS noted 04.11.25 exam**  - A1c: 8.4 on 03.28.24  - pt lost to follow up from 06.16.23  to 11.14.23 - pt reports recent improvement in A1c from 11 down to 6 since starting Ozempic - s/p IVA OS #1 (03.22.23), #2 (04.21.23), #3 (05.19.23), #4 (06.16.23), #5 (11.15.23), #6 (12.22.24), # 7 (01.26.24), #8 (03.15.24), #9 (05.10.24), #10 (06.28.24), #11 (08.12.24), #12 (10.16.24), #13 (12.17.24), #14 (02.11.25)  - s/p IVA OD #1 (11.15.2), #2 (12.22.23), #3 (01.26.24), #4 (03.15.24), #5 (05.10.24), #6 (06.28.24), #7 (12.17.24), #8 (02.11.25), #9 (02.11.25) - s/p PRP OD (04.22.24) - exam shows scattered MA, DBH and CWS OU, mild VH OD and central cystic changes OS -- improving - new VH OD noted on 11.14.23 visit  - FA (11.14.23) OD: Enlarged FAZ, scattered patches of vascular non-perfusion greatest temporal periphery, mild perivascular leakage temporal periphery and from MA, focal early NVE superonasal midzone; OS: Mild enlargement of FAZ, scattered patches of vascular non-perfusion greatest temporal periphery, mild perivascular leakage temporal periphery and from MA; no NV -- pt would benefit from PRP OD -- completed 04.22.24 - BCVA OD decreased to 20/150 from 20/80 (severe retinal ischemia + cataract); OS decreased to 20/100 from 20/25 (new VH) - OCT shows  OD: central ellipsoid signal disruptions and thinning; vitreous opacities -- stably improved; irregular lamination, mild scattered IRHM; OS: Interval development of vitreous opacities / VH, retina grossly attached, no obvious IRF / edema at 8+ weeks -  recommend IVA OU (OD #9 and OS #15) today, 04.11.25 w/ f/u back to 4 wks - pt in agreement - RBA of procedure discussed, questions answered - IVA informed consent obtained and signed, 11.16.23 (OU) - see procedure note - f/u 4 weeks - DFE, OCT, possible injxns  5-7. Hypertensive retinopathy w/ retinal ischemia OU - BP uncontrolled at initial consult -- 170s / 110-120s in office 12.23.22, BP was significantly improved to 147/80 at f/u visit - FA 12.23.22 shows significant retinal ischemia OU  -- enlarged FAZ OU (OD> OS) and significant patches of capillary drop out OU (OD>OS)  8. Mixed Cataract OU - The symptoms of cataract, surgical options, and treatments and risks were discussed with patient. - discussed diagnosis and progression - visually significant  - referred to [facility] / Dr. Allison Ivory for cat eval and establishment of primary eye care -- [facility]  Ophthalmic Meds Ordered this visit:  Meds ordered this encounter  Medications   Bevacizumab (AVASTIN) SOLN 1.25 mg   Bevacizumab (AVASTIN) SOLN 1.25 mg     Return in about 4 weeks (around 11/08/2023) for f/u PDR OU, DFE, OCT.  This document serves as a record of services personally performed by Jeanice Millard, MD, PhD. It was created on their behalf by Eller Gut COT, an ophthalmic technician. The creation of this record is the provider's dictation and/or activities during the visit.    Electronically signed by: Eller Gut COT 04.10.25 1:33 AM  This document serves as a record of services personally performed by Jeanice Millard, MD, PhD. It was created on their behalf by Morley Arabia. Bevin Bucks, OA an ophthalmic technician. The creation of this record is the provider's dictation and/or activities during the visit.    Electronically signed by: Morley Arabia. Bevin Bucks, OA 10/13/23 1:33 AM  Jeanice Millard, M.D., Ph.D. Diseases & Surgery of the Retina and Vitreous [facility]  I have reviewed the above documentation for accuracy and completeness, and I agree with the above. Jeanice Millard, M.D., Ph.D. 10/13/23 2:02 AM   Abbreviations: M myopia (nearsighted); A astigmatism; H hyperopia (farsighted); P presbyopia; Mrx spectacle prescription;  CTL contact lenses; OD right eye; OS left eye; OU both eyes  XT exotropia; ET esotropia; PEK punctate epithelial keratitis; PEE punctate epithelial erosions; DES dry eye syndrome; MGD meibomian gland dysfunction; ATs  artificial tears; PFAT's preservative free artificial tears; NSC nuclear sclerotic cataract; PSC posterior subcapsular cataract; ERM epi-retinal membrane; PVD posterior vitreous detachment; RD retinal detachment; DM diabetes mellitus; DR diabetic retinopathy; NPDR non-proliferative diabetic retinopathy; PDR proliferative diabetic retinopathy; CSME clinically significant macular edema; DME diabetic macular edema; dbh dot blot hemorrhages; CWS cotton wool spot; POAG primary open angle glaucoma; C/D cup-to-disc ratio; HVF humphrey visual field; GVF goldmann visual field; OCT optical coherence tomography; IOP intraocular pressure; BRVO Branch retinal vein occlusion; CRVO central retinal vein occlusion; CRAO central retinal artery occlusion; BRAO branch retinal artery occlusion; RT retinal tear; SB scleral buckle; PPV pars plana vitrectomy; VH Vitreous hemorrhage; PRP panretinal laser photocoagulation; IVK intravitreal kenalog; VMT vitreomacular traction; MH Macular hole;  NVD neovascularization of the disc; NVE neovascularization elsewhere; AREDS age related eye disease study; ARMD age related macular degeneration; POAG primary open angle glaucoma; EBMD epithelial/anterior basement membrane dystrophy; ACIOL anterior chamber intraocular lens; IOL intraocular lens; PCIOL posterior chamber intraocular lens; Phaco/IOL phacoemulsification with intraocular lens placement; PRK photorefractive keratectomy; LASIK laser assisted in situ keratomileusis; HTN  hypertension; DM diabetes mellitus; COPD chronic obstructive pulmonary disease

## 2023-10-11 ENCOUNTER — Ambulatory Visit (INDEPENDENT_AMBULATORY_CARE_PROVIDER_SITE_OTHER): Admitting: Ophthalmology

## 2023-10-11 ENCOUNTER — Encounter (INDEPENDENT_AMBULATORY_CARE_PROVIDER_SITE_OTHER): Payer: Self-pay | Admitting: Ophthalmology

## 2023-10-11 DIAGNOSIS — I10 Essential (primary) hypertension: Secondary | ICD-10-CM

## 2023-10-11 DIAGNOSIS — H35033 Hypertensive retinopathy, bilateral: Secondary | ICD-10-CM

## 2023-10-11 DIAGNOSIS — H3582 Retinal ischemia: Secondary | ICD-10-CM

## 2023-10-11 DIAGNOSIS — H4313 Vitreous hemorrhage, bilateral: Secondary | ICD-10-CM | POA: Diagnosis not present

## 2023-10-11 DIAGNOSIS — E113412 Type 2 diabetes mellitus with severe nonproliferative diabetic retinopathy with macular edema, left eye: Secondary | ICD-10-CM

## 2023-10-11 DIAGNOSIS — Z7985 Long-term (current) use of injectable non-insulin antidiabetic drugs: Secondary | ICD-10-CM

## 2023-10-11 DIAGNOSIS — E113511 Type 2 diabetes mellitus with proliferative diabetic retinopathy with macular edema, right eye: Secondary | ICD-10-CM | POA: Diagnosis not present

## 2023-10-11 DIAGNOSIS — H25813 Combined forms of age-related cataract, bilateral: Secondary | ICD-10-CM

## 2023-10-11 DIAGNOSIS — H4311 Vitreous hemorrhage, right eye: Secondary | ICD-10-CM

## 2023-10-11 MED ORDER — BEVACIZUMAB CHEMO INJECTION 1.25MG/0.05ML SYRINGE FOR KALEIDOSCOPE
1.2500 mg | INTRAVITREAL | Status: AC | PRN
  Administered 2023-10-11: 1.25 mg via INTRAVITREAL

## 2023-10-22 ENCOUNTER — Encounter (INDEPENDENT_AMBULATORY_CARE_PROVIDER_SITE_OTHER): Admitting: Ophthalmology

## 2023-10-22 DIAGNOSIS — Z7985 Long-term (current) use of injectable non-insulin antidiabetic drugs: Secondary | ICD-10-CM

## 2023-10-22 DIAGNOSIS — H35033 Hypertensive retinopathy, bilateral: Secondary | ICD-10-CM

## 2023-10-22 DIAGNOSIS — E113511 Type 2 diabetes mellitus with proliferative diabetic retinopathy with macular edema, right eye: Secondary | ICD-10-CM

## 2023-10-22 DIAGNOSIS — H4311 Vitreous hemorrhage, right eye: Secondary | ICD-10-CM

## 2023-10-22 DIAGNOSIS — I10 Essential (primary) hypertension: Secondary | ICD-10-CM

## 2023-10-22 DIAGNOSIS — E113412 Type 2 diabetes mellitus with severe nonproliferative diabetic retinopathy with macular edema, left eye: Secondary | ICD-10-CM

## 2023-11-08 ENCOUNTER — Encounter (INDEPENDENT_AMBULATORY_CARE_PROVIDER_SITE_OTHER): Admitting: Ophthalmology

## 2023-11-11 NOTE — Progress Notes (Signed)
 [facility] - Clinic Note  11/12/2023     CHIEF COMPLAINT Patient presents for Retina Follow Up   HISTORY OF PRESENT ILLNESS: [name] is a 58 y.o. male who presents to the clinic today for:   HPI     Retina Follow Up   Patient presents with  Diabetic Retinopathy.  In both eyes.  This started 2.5 years ago.  Duration of 4 weeks.  Since onset it is rapidly improving.  I, the attending physician,  performed the HPI with the patient and updated documentation appropriately.        Comments   Pt presents for 8 week retina follow, PRD OU. Pt states vision seems slightly improved. Pt states he has the same flashes and floaters. Pt denies any discomfort. Pt states A1c is 8.5, taken 2 months ago. BS was 115 yesterday morning.      Last edited by Ronelle Coffee, MD on 11/12/2023 11:53 AM.    Pt states the blood is clearing from his eye  Referring physician: Clinic, [facility] [address] [facility] [place],  [place] [address]  HISTORICAL INFORMATION:   Selected notes from the MEDICAL RECORD NUMBER Referred by [facility] for retinal hemes LEE:  Ocular Hx- PMH-    CURRENT MEDICATIONS: No current outpatient medications on file. (Ophthalmic Drugs)   No current facility-administered medications for this visit. (Ophthalmic Drugs)   Current Outpatient Medications (Other)  Medication Sig   AMLODIPINE BESYLATE PO Take 10 mg by mouth daily.   Cholecalciferol 25 MCG (1000 UT) capsule Take by mouth.   Docusate Sodium  (DSS) 100 MG CAPS Take by mouth.   doxycycline  (VIBRAMYCIN ) 100 MG capsule Take 1 capsule (100 mg total) by mouth 2 (two) times daily.   escitalopram (LEXAPRO) 10 MG tablet Take 10 mg by mouth daily.   furosemide  (LASIX ) 40 MG tablet Take 1 tablet (40 mg total) by mouth daily as needed.   gabapentin (NEURONTIN) 800 MG tablet Take 800 mg by mouth QID.   hydrOXYzine (VISTARIL) 25 MG capsule Take 25 mg by mouth 3 (three) times daily as needed.  One tablet daily   ibuprofen (ADVIL) 800 MG tablet Take 800 mg by mouth every 8 (eight) hours as needed.   loratadine  (CLARITIN ) 10 MG tablet Take by mouth.   losartan (COZAAR) 100 MG tablet Take 100 mg by mouth daily.   metFORMIN  (GLUCOPHAGE ) 500 MG tablet Take 1 tablet (500 mg total) by mouth 2 (two) times daily with a meal.   methocarbamol (ROBAXIN) 750 MG tablet TAKE ONE TABLET BY MOUTH TWICE A DAY AS NEEDED FOR MUSCLE SPASM   naloxone (NARCAN) nasal spray 4 mg/0.1 mL SPRAY 1 SPRAY INTO ONE NOSTRIL AS DIRECTED FOR OPIOID OVERDOSE - CALL 911 IMMEDIATELY, ADMINISTER DOSE, THEN TURN PERSON ON SIDE - IF NO RESPONSE IN 2-3 MINUTES OR PERSON RESPONDS BUT RELAPSES, REPEAT USING A NEW SPRAY DEVICE AND SPRAY INTO THE OTHER NOSTRIL   naproxen (NAPROSYN) 500 MG tablet Take 1 tablet by mouth 2 (two) times daily.   omeprazole (PRILOSEC) 40 MG capsule Take 40 mg by mouth daily.   oxyCODONE -acetaminophen  (PERCOCET) 10-325 MG tablet Take 1 tablet by mouth every 4 (four) hours as needed for pain. 1 q 6 hours and 1 at bedtime   polyethylene glycol (MIRALAX  / GLYCOLAX ) 17 g packet Take 17 g by mouth daily.   ranitidine  (ZANTAC ) 75 MG tablet Take by mouth.   rosuvastatin  (CRESTOR ) 10 MG tablet Take 1 tablet (10 mg total)  by mouth daily.   Semaglutide (OZEMPIC, 1 MG/DOSE, [place] [address]) Inject 1 mg into the skin once a week.   sertraline (ZOLOFT) 25 MG tablet Take 25 mg by mouth daily. Per patient is taking 1/2 tablet daily   spironolactone (ALDACTONE) 25 MG tablet Take 25 mg by mouth daily.   testosterone cypionate (DEPOTESTOTERONE CYPIONATE) 100 MG/ML injection Inject 100 mg into the muscle once a week.   triamcinolone cream (KENALOG) 0.1 % APPLY THIN LAYER TO AFFECTED AREA TWICE A DAY AS NEEDED FOR ECZEMA   zolpidem (AMBIEN) 10 MG tablet 1 tablet at bedtime as needed Orally Once a day   No current facility-administered medications for this visit. (Other)   REVIEW OF SYSTEMS: ROS   Positive for: Endocrine,  Eyes Negative for: Constitutional, Gastrointestinal, Neurological, Skin, Genitourinary, Musculoskeletal, HENT, Cardiovascular, Respiratory, Psychiatric, Allergic/Imm, Heme/Lymph Last edited by Carrington Clack, COT on 11/12/2023  8:36 AM.     ALLERGIES Allergies  Allergen Reactions   Lisinopril Other (See Comments) and Hives    Other reaction(s): Electrocardiogram abnormal  Other Reaction(s): Electrocardiogram abnormal, Abdominal pain, Electrocardiogram abnormal, Abdominal pain   Sildenafil Nausea And Vomiting   PAST MEDICAL HISTORY Past Medical History:  Diagnosis Date   Arthritis    Diabetes mellitus    Fistula, perirectal    GERD (gastroesophageal reflux disease)    Hypertension    Osteomyelitis of great toe of right foot (HCC) 10/31/2021   Polymicrobial bacterial infection 10/31/2021   PTSD (post-traumatic stress disorder)    PTSD (post-traumatic stress disorder) 10/31/2021   Small bowel obstruction (HCC)    Past Surgical History:  Procedure Laterality Date   RECTAL SURGERY     FAMILY HISTORY Family History  Problem Relation Age of Onset   Heart attack Father    Obesity Father    SOCIAL HISTORY Social History   Tobacco Use   Smoking status: Some Days    Types: Cigars   Smokeless tobacco: Never   Tobacco comments:    Occasional cigar  Vaping Use   Vaping status: Never Used  Substance Use Topics   Alcohol use: Yes    Comment: occ   Drug use: No       OPHTHALMIC EXAM:  Base Eye Exam     Visual Acuity (Snellen - Linear)       Right Left   Dist cc 100-2 20/50 -2   Dist ph cc NI 20/30    Correction: Glasses         Tonometry (Tonopen, 8:45 AM)       Right Left   Pressure 19 16         Pupils       Dark Light Shape React APD   Right 3 2 Round Brisk None   Left 3 2 Round Brisk None         Visual Fields       Left Right    Full Full         Extraocular Movement       Right Left    Full, Ortho Full, Ortho         Neuro/Psych      Oriented x3: Yes   Mood/Affect: Normal         Dilation     Both eyes: 1.0% Mydriacyl, 2.5% Phenylephrine @ 8:46 AM           Slit Lamp and Fundus Exam     Slit Lamp Exam  Right Left   Lids/Lashes Dermatochalasis - upper lid Dermatochalasis - upper lid   Conjunctiva/Sclera mild melanosis, nasal pingeucula mild melanosis, nasal and temporal pinguecula   Cornea arcus, 2+ Punctate epithelial erosions arcus, 2+inferior PEE, mild tear film debris   Anterior Chamber deep, clear, narrow temporal angle Deep and quiet   Iris Round and dilated, No NVI Round and dilated, No NVI   Lens 3+ Nuclear sclerosis with brunescence, 2-3+ Cortical cataract 3+ Nuclear sclerosis with brunescence, 3+ Cortical cataract   Anterior Vitreous Vitreous syneresis, blood stained vitreous condensations below disc -- improved Vitreous syneresis, +RBCs, blood stained vitreous condensations -- vastly improved and settling inferiorly         Fundus Exam       Right Left   Disc Pink and Sharp, no NVD, Compact Pink and Sharp. CWS at 0600   C/D Ratio 0.2 0.2   Macula Flat, good foveal reflex, RPE mottling and clumping, scattered MA / DBH, no edema, scattered CWSs greatest superior mac Flat, Good foveal reflex, scattered MA and CWSs   Vessels attenuated, Tortuous attenuated   Periphery Attached, scattered DBH and CWS posteriorly, light PRP changes 360 Attached, scattered DBH and CWS posteriorly, inferior retina obscured by [facility]           Refraction     Wearing Rx       Sphere Cylinder Axis Add   Right +0.75 Sphere  +2.50   Left +1.75 +0.50 170 +2.50           IMAGING AND PROCEDURES  Imaging and Procedures for 11/12/2023  OCT, Retina - OU - Both Eyes        Right Eye Quality was good. Central Foveal Thickness: 224. Progression has been stable. Findings include normal foveal contour, no IRF, no SRF, outer retinal atrophy, vitreomacular adhesion (central ellipsoid signal disruptions and  thinning; vitreous opacities -- stably improved; irregular lamination, mild scattered IRHM).   Left Eye Quality was good. Central Foveal Thickness: 264. Progression has improved. Findings include normal foveal contour, no SRF, intraretinal hyper-reflective material, intraretinal fluid (Interval improvement in vitreous opacities / VH, no IRF / edema, focal IRA temporal macula).   Notes  *Images captured and stored on drive  Diagnosis / Impression:  OD: central ellipsoid signal disruptions and thinning; vitreous opacities -- stably improved; irregular lamination, mild scattered IRHM OS: Interval improvement in vitreous opacities / VH, no IRF / edema, focal IRA temporal macula  Clinical management:  See below  Abbreviations: NFP - Normal foveal profile. CME - cystoid macular edema. PED - pigment epithelial detachment. IRF - intraretinal fluid. SRF - subretinal fluid. EZ - ellipsoid zone. ERM - epiretinal membrane. ORA - outer retinal atrophy. ORT - outer retinal tubulation. SRHM - subretinal hyper-reflective material. IRHM - intraretinal hyper-reflective material      Intravitreal Injection, Pharmacologic Agent - OD - Right Eye       Time Out 11/12/2023. 9:29 AM. Confirmed correct patient, procedure, site, and patient consented.   Anesthesia Topical anesthesia was used. Anesthetic medications included Lidocaine  2%, Proparacaine 0.5%.   Procedure Preparation included 5% betadine to ocular surface, eyelid speculum. A (32g) needle was used.   Injection: 1.25 mg Bevacizumab  1.25mg /0.27ml   Route: Intravitreal, Site: Right Eye   NDC: H525437, Lot: [id number], Expiration date: 07/18/2024   Post-op Post injection exam found visual acuity of at least counting fingers. The patient tolerated the procedure well. There were no complications. The patient received written and verbal post procedure care education.  Intravitreal Injection, Pharmacologic Agent - OS - Left Eye       Time  Out 11/12/2023. 9:29 AM. Confirmed correct patient, procedure, site, and patient consented.   Anesthesia Topical anesthesia was used. Anesthetic medications included Lidocaine  2%, Proparacaine 0.5%.   Procedure Preparation included 5% betadine to ocular surface, eyelid speculum. A (33g) needle was used.   Injection: 1.25 mg Bevacizumab  1.25mg /0.26ml   Route: Intravitreal, Site: Left Eye   NDC: H525437, Lot: [id number], Expiration date: 02/04/2024   Post-op Post injection exam found visual acuity of at least counting fingers. The patient tolerated the procedure well. There were no complications. The patient received written and verbal post procedure care education. Post injection medications were not given.            ASSESSMENT/PLAN:    ICD-10-CM   1. 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 Vitreous hemorrhage of both eyes (HCC)  H43.13 Intravitreal Injection, Pharmacologic Agent - OD - Right Eye    Intravitreal Injection, Pharmacologic Agent - OS - Left Eye    Bevacizumab  (AVASTIN ) SOLN 1.25 mg    Bevacizumab  (AVASTIN ) SOLN 1.25 mg    3. 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 Long-term (current) use of injectable non-insulin  antidiabetic drugs  Z79.85     5. Essential hypertension  I10     6. Hypertensive retinopathy of both eyes  H35.033     7. Retinal ischemia  H35.82     8. Combined forms of age-related cataract of both eyes  H25.813      1-4.  Proliferative diabetic retinopathy OD, Severe non-proliferative diabetic retinopathy, OS  ** New VH OS noted  04.11.25 exam**  - A1c: 8.4 on 03.28.24  - pt lost to follow up from 06.16.23 to 11.14.23 - pt reports recent improvement in A1c from 11 down to 6 since starting Ozempic - s/p IVA OS #1 (03.22.23), #2 (04.21.23), #3 (05.19.23), #4 (06.16.23), #5 (11.15.23), #6 (12.22.24), # 7 (01.26.24), #8 (03.15.24), #9 (05.10.24), #10 (06.28.24), #11 (08.12.24), #12 (10.16.24), #13 (12.17.24), #14 (02.11.25) , #15 (04.11.25) - s/p IVA OD #1 (11.15.2), #2 (12.22.23), #3 (01.26.24), #4 (03.15.24), #5 (05.10.24), #6 (06.28.24), #7 (12.17.24), #8 (02.11.25), #9 (02.11.25), #10 (04.11.25) - s/p PRP OD (04.22.24) - new VH OD noted on 11.14.23 visit  - FA (11.14.23) OD: Enlarged FAZ, scattered patches of vascular non-perfusion greatest temporal periphery, mild perivascular leakage temporal periphery and from MA, focal early NVE superonasal midzone; OS: Mild enlargement of FAZ, scattered patches of vascular non-perfusion greatest temporal periphery, mild perivascular leakage temporal periphery and from MA; no NV -- pt would benefit from PRP OD -- completed 04.22.24 - BCVA OD improved to 20/100 from 20/150 (severe retinal ischemia + cataract); OS improved to 20/30 from 20/100 (improved VH) - OCT shows  OD: central ellipsoid signal disruptions and thinning; vitreous opacities -- stably improved; irregular lamination, mild scattered IRHM; OS: Interval improvement in vitreous opacities / VH, no IRF / edema, focal IRA temporal macula at 4 wks - recommend IVA OU (OD #11 and OS #16) today, 05.13.25 w/ f/u  in 5-6 wks -- due to MD being out of the office in 4 weeks - pt in agreement - RBA of procedure discussed, questions answered - IVA informed consent obtained and signed, 11.16.23 (OU) - see procedure note - f/u 5-6 weeks - DFE, OCT, possible injxns  5-7. Hypertensive retinopathy w/ retinal ischemia OU - BP uncontrolled at initial consult -- 170s / 110-120s in office 12.23.22, BP was significantly improved to 147/80 at f/u  visit - FA 12.23.22 shows significant retinal ischemia OU -- enlarged FAZ OU (OD> OS) and significant patches of capillary drop out OU (OD>OS)  8. Mixed Cataract OU - The symptoms of cataract, surgical options, and treatments and risks were discussed with patient. - discussed diagnosis and progression - visually significant  - referred to [facility] / Dr. Allison Ivory for cat eval and establishment of primary eye care -- [facility]  Ophthalmic Meds Ordered this visit:  Meds ordered this encounter  Medications   Bevacizumab  (AVASTIN ) SOLN 1.25 mg   Bevacizumab  (AVASTIN ) SOLN 1.25 mg     Return for f/u 5-6 weeks, PDR OU, DFE, OCT, Possible Injxn.  This document serves as a record of services personally performed by Jeanice Millard, MD, PhD. It was created on their behalf by Olene Berne, COT an ophthalmic technician. The creation of this record is the provider's dictation and/or activities during the visit.    Electronically signed by:  Olene Berne, COT  11/12/23 12:11 PM  This document serves as a record of services personally performed by Jeanice Millard, MD, PhD. It was created on their behalf by Morley Arabia. Bevin Bucks, OA an ophthalmic technician. The creation of this record is the provider's dictation and/or activities during the visit.    Electronically signed by: Morley Arabia. Bevin Bucks, OA 11/12/23 12:11 PM  Jeanice Millard, M.D., Ph.D. Diseases & Surgery of the Retina and Vitreous [facility]  I have reviewed the above documentation for accuracy and completeness, and I agree with the above. Jeanice Millard, M.D., Ph.D. 11/12/23 12:13 PM   Abbreviations: M myopia (nearsighted); A astigmatism; H hyperopia (farsighted); P presbyopia; Mrx spectacle prescription;  CTL contact lenses; OD right eye; OS left eye; OU both eyes  XT exotropia; ET esotropia; PEK punctate epithelial keratitis; PEE punctate epithelial erosions; DES  dry eye syndrome; MGD meibomian gland dysfunction; ATs artificial tears; PFAT's preservative free artificial tears; NSC nuclear sclerotic cataract; PSC posterior subcapsular cataract; ERM epi-retinal membrane; PVD posterior vitreous detachment; RD retinal detachment; DM diabetes mellitus; DR diabetic retinopathy; NPDR non-proliferative diabetic retinopathy; PDR proliferative diabetic retinopathy; CSME clinically significant macular edema; DME diabetic macular edema; dbh dot blot hemorrhages; CWS cotton wool spot; POAG primary open angle glaucoma; C/D cup-to-disc ratio; HVF humphrey visual field; GVF goldmann visual field; OCT optical coherence tomography; IOP intraocular pressure; BRVO Branch retinal vein occlusion; CRVO central retinal vein occlusion; CRAO central retinal artery occlusion; BRAO branch retinal artery occlusion; RT retinal tear; SB scleral buckle; PPV pars plana vitrectomy; VH Vitreous hemorrhage; PRP panretinal laser photocoagulation; IVK intravitreal kenalog; VMT vitreomacular traction; MH Macular hole;  NVD neovascularization of the disc; NVE neovascularization elsewhere; AREDS age related eye disease study; ARMD age related macular degeneration; POAG primary open angle glaucoma; EBMD epithelial/anterior basement membrane dystrophy; ACIOL anterior chamber intraocular lens; IOL intraocular lens; PCIOL posterior chamber intraocular lens; Phaco/IOL phacoemulsification with intraocular lens placement; PRK photorefractive keratectomy; LASIK laser assisted in situ keratomileusis;  HTN hypertension; DM diabetes mellitus; COPD chronic obstructive pulmonary disease

## 2023-11-12 ENCOUNTER — Encounter (INDEPENDENT_AMBULATORY_CARE_PROVIDER_SITE_OTHER): Payer: Self-pay | Admitting: Ophthalmology

## 2023-11-12 ENCOUNTER — Ambulatory Visit (INDEPENDENT_AMBULATORY_CARE_PROVIDER_SITE_OTHER): Admitting: Ophthalmology

## 2023-11-12 DIAGNOSIS — H4313 Vitreous hemorrhage, bilateral: Secondary | ICD-10-CM

## 2023-11-12 DIAGNOSIS — E113511 Type 2 diabetes mellitus with proliferative diabetic retinopathy with macular edema, right eye: Secondary | ICD-10-CM | POA: Diagnosis not present

## 2023-11-12 DIAGNOSIS — H25813 Combined forms of age-related cataract, bilateral: Secondary | ICD-10-CM

## 2023-11-12 DIAGNOSIS — I10 Essential (primary) hypertension: Secondary | ICD-10-CM

## 2023-11-12 DIAGNOSIS — E113412 Type 2 diabetes mellitus with severe nonproliferative diabetic retinopathy with macular edema, left eye: Secondary | ICD-10-CM

## 2023-11-12 DIAGNOSIS — Z7985 Long-term (current) use of injectable non-insulin antidiabetic drugs: Secondary | ICD-10-CM | POA: Diagnosis not present

## 2023-11-12 DIAGNOSIS — H35033 Hypertensive retinopathy, bilateral: Secondary | ICD-10-CM

## 2023-11-12 DIAGNOSIS — H3582 Retinal ischemia: Secondary | ICD-10-CM

## 2023-11-12 MED ORDER — BEVACIZUMAB CHEMO INJECTION 1.25MG/0.05ML SYRINGE FOR KALEIDOSCOPE
1.2500 mg | INTRAVITREAL | Status: AC | PRN
  Administered 2023-11-12: 1.25 mg via INTRAVITREAL

## 2023-11-12 MED ORDER — BEVACIZUMAB CHEMO INJECTION 1.25MG/0.05ML SYRINGE FOR KALEIDOSCOPE
1.2500 mg | INTRAVITREAL | Status: AC | PRN
Start: 2023-11-12 — End: 2023-11-12
  Administered 2023-11-12: 1.25 mg via INTRAVITREAL

## 2023-12-17 NOTE — Progress Notes (Shared)
 [facility] - Clinic Note  12/25/2023     CHIEF COMPLAINT Patient presents for No chief complaint on file.   HISTORY OF PRESENT ILLNESS: [name] is a 58 y.o. male who presents to the clinic today for:    Pt states the blood is clearing from his eye  Referring physician: Clinic, [facility] [address] [facility] [place],  [place] [address]  HISTORICAL INFORMATION:   Selected notes from the MEDICAL RECORD NUMBER Referred by [facility] for retinal hemes LEE:  Ocular Hx- PMH-    CURRENT MEDICATIONS: No current outpatient medications on file. (Ophthalmic Drugs)   No current facility-administered medications for this visit. (Ophthalmic Drugs)   Current Outpatient Medications (Other)  Medication Sig   AMLODIPINE BESYLATE PO Take 10 mg by mouth daily.   Cholecalciferol 25 MCG (1000 UT) capsule Take by mouth.   Docusate Sodium  (DSS) 100 MG CAPS Take by mouth.   doxycycline  (VIBRAMYCIN ) 100 MG capsule Take 1 capsule (100 mg total) by mouth 2 (two) times daily.   escitalopram (LEXAPRO) 10 MG tablet Take 10 mg by mouth daily.   furosemide  (LASIX ) 40 MG tablet Take 1 tablet (40 mg total) by mouth daily as needed.   gabapentin (NEURONTIN) 800 MG tablet Take 800 mg by mouth QID.   hydrOXYzine (VISTARIL) 25 MG capsule Take 25 mg by mouth 3 (three) times daily as needed. One tablet daily   ibuprofen (ADVIL) 800 MG tablet Take 800 mg by mouth every 8 (eight) hours as needed.   loratadine  (CLARITIN ) 10 MG tablet Take by mouth.   losartan (COZAAR) 100 MG tablet Take 100 mg by mouth daily.   metFORMIN  (GLUCOPHAGE ) 500 MG tablet Take 1 tablet (500 mg total) by mouth 2 (two) times daily with a meal.   methocarbamol (ROBAXIN) 750 MG tablet TAKE ONE TABLET BY MOUTH TWICE A DAY AS NEEDED FOR MUSCLE SPASM   naloxone (NARCAN) nasal spray 4 mg/0.1 mL SPRAY 1 SPRAY INTO ONE NOSTRIL AS DIRECTED FOR OPIOID OVERDOSE - CALL 911 IMMEDIATELY, ADMINISTER DOSE, THEN TURN  PERSON ON SIDE - IF NO RESPONSE IN 2-3 MINUTES OR PERSON RESPONDS BUT RELAPSES, REPEAT USING A NEW SPRAY DEVICE AND SPRAY INTO THE OTHER NOSTRIL   naproxen (NAPROSYN) 500 MG tablet Take 1 tablet by mouth 2 (two) times daily.   omeprazole (PRILOSEC) 40 MG capsule Take 40 mg by mouth daily.   oxyCODONE -acetaminophen  (PERCOCET) 10-325 MG tablet Take 1 tablet by mouth every 4 (four) hours as needed for pain. 1 q 6 hours and 1 at bedtime   polyethylene glycol (MIRALAX  / GLYCOLAX ) 17 g packet Take 17 g by mouth daily.   ranitidine  (ZANTAC ) 75 MG tablet Take by mouth.   rosuvastatin  (CRESTOR ) 10 MG tablet Take 1 tablet (10 mg total) by mouth daily.   Semaglutide (OZEMPIC, 1 MG/DOSE, [place]) Inject 1 mg into the skin once a week.   sertraline (ZOLOFT) 25 MG tablet Take 25 mg by mouth daily. Per patient is taking 1/2 tablet daily   spironolactone (ALDACTONE) 25 MG tablet Take 25 mg by mouth daily.   testosterone cypionate (DEPOTESTOTERONE CYPIONATE) 100 MG/ML injection Inject 100 mg into the muscle once a week.   triamcinolone cream (KENALOG) 0.1 % APPLY THIN LAYER TO AFFECTED AREA TWICE A DAY AS NEEDED FOR ECZEMA   zolpidem (AMBIEN) 10 MG tablet 1 tablet at bedtime as needed Orally Once a day   No current facility-administered medications for this visit. (Other)   REVIEW OF  SYSTEMS:   ALLERGIES Allergies  Allergen Reactions   Lisinopril Other (See Comments) and Hives    Other reaction(s): Electrocardiogram abnormal  Other Reaction(s): Electrocardiogram abnormal, Abdominal pain, Electrocardiogram abnormal, Abdominal pain   Sildenafil Nausea And Vomiting   PAST MEDICAL HISTORY Past Medical History:  Diagnosis Date   Arthritis    Diabetes mellitus    Fistula, perirectal    GERD (gastroesophageal reflux disease)    Hypertension    Osteomyelitis of great toe of right foot (HCC) 10/31/2021   Polymicrobial bacterial infection 10/31/2021   PTSD (post-traumatic stress disorder)    PTSD (post-traumatic  stress disorder) 10/31/2021   Small bowel obstruction (HCC)    Past Surgical History:  Procedure Laterality Date   RECTAL SURGERY     FAMILY HISTORY Family History  Problem Relation Age of Onset   Heart attack Father    Obesity Father    SOCIAL HISTORY Social History   Tobacco Use   Smoking status: Some Days    Types: Cigars   Smokeless tobacco: Never   Tobacco comments:    Occasional cigar  Vaping Use   Vaping status: Never Used  Substance Use Topics   Alcohol use: Yes    Comment: occ   Drug use: No       OPHTHALMIC EXAM:  Not recorded    IMAGING AND PROCEDURES  Imaging and Procedures for 12/25/2023          ASSESSMENT/PLAN:  No diagnosis found.  1-4.  Proliferative diabetic retinopathy OD, Severe non-proliferative diabetic retinopathy, OS  ** New VH OS noted 04.11.25 exam**  - A1c: 8.4 on 03.28.24  - pt lost to follow up from 06.16.23 to 11.14.23 - pt reports recent improvement in A1c from 11 down to 6 since starting Ozempic - s/p IVA OS #1 (03.22.23), #2 (04.21.23), #3 (05.19.23), #4 (06.16.23), #5 (11.15.23), #6 (12.22.24), # 7 (01.26.24), #8 (03.15.24), #9 (05.10.24), #10 (06.28.24), #11 (08.12.24), #12 (10.16.24), #13 (12.17.24), #14 (02.11.25) , #15 (04.11.25), #16 (05.13.25) - s/p IVA OD #1 (11.15.2), #2 (12.22.23), #3 (01.26.24), #4 (03.15.24), #5 (05.10.24), #6 (06.28.24), #7 (12.17.24), #8 (02.11.25), #9 (02.11.25), #10 (04.11.25), #11 (05.13.25) - s/p PRP OD (04.22.24) - new VH OD noted on 11.14.23 visit  - FA (11.14.23) OD: Enlarged FAZ, scattered patches of vascular non-perfusion greatest temporal periphery, mild perivascular leakage temporal periphery and from MA, focal early NVE superonasal midzone; OS: Mild enlargement of FAZ, scattered patches of vascular non-perfusion greatest temporal periphery, mild perivascular leakage temporal periphery and from MA; no NV -- pt would benefit from PRP OD -- completed 04.22.24 - BCVA OD improved to 20/100  from 20/150 (severe retinal ischemia + cataract); OS improved to 20/30 from 20/100 (improved VH) - OCT shows  OD: central ellipsoid signal disruptions and thinning; vitreous opacities -- stably improved; irregular lamination, mild scattered IRHM; OS: Interval improvement in vitreous opacities / VH, no IRF / edema, focal IRA temporal macula at 4 wks - recommend IVA OU (OD #12 and OS #17) today, 06.25.25 w/ f/u in 5-6 wks -- due to MD being out of the office in 4 weeks - pt in agreement - RBA of procedure discussed, questions answered - IVA informed consent obtained and signed, 11.16.23 (OU) - see procedure note - f/u 5-6 weeks - DFE, OCT, possible injxns  5-7. Hypertensive retinopathy w/ retinal ischemia OU - BP uncontrolled at initial consult -- 170s / 110-120s in office 12.23.22, BP was significantly improved to 147/80 at f/u visit - FA 12.23.22 shows  significant retinal ischemia OU -- enlarged FAZ OU (OD> OS) and significant patches of capillary drop out OU (OD>OS)  8. Mixed Cataract OU - The symptoms of cataract, surgical options, and treatments and risks were discussed with patient. - discussed diagnosis and progression - visually significant  - referred to [facility] / Dr. Allison Ivory for cat eval and establishment of primary eye care -- [facility]  Ophthalmic Meds Ordered this visit:  No orders of the defined types were placed in this encounter.    No follow-ups on file.  This document serves as a record of services personally performed by Jeanice Millard, MD, PhD. It was created on their behalf by Angelia Kelp, an ophthalmic technician. The creation of this record is the provider's dictation and/or activities during the visit.    Electronically signed by: Angelia Kelp, OA, 12/17/23  9:15 AM   Jeanice Millard, M.D., Ph.D. Diseases & Surgery of the Retina and Vitreous [facility]   Abbreviations: M myopia  (nearsighted); A astigmatism; H hyperopia (farsighted); P presbyopia; Mrx spectacle prescription;  CTL contact lenses; OD right eye; OS left eye; OU both eyes  XT exotropia; ET esotropia; PEK punctate epithelial keratitis; PEE punctate epithelial erosions; DES dry eye syndrome; MGD meibomian gland dysfunction; ATs artificial tears; PFAT's preservative free artificial tears; NSC nuclear sclerotic cataract; PSC posterior subcapsular cataract; ERM epi-retinal membrane; PVD posterior vitreous detachment; RD retinal detachment; DM diabetes mellitus; DR diabetic retinopathy; NPDR non-proliferative diabetic retinopathy; PDR proliferative diabetic retinopathy; CSME clinically significant macular edema; DME diabetic macular edema; dbh dot blot hemorrhages; CWS cotton wool spot; POAG primary open angle glaucoma; C/D cup-to-disc ratio; HVF humphrey visual field; GVF goldmann visual field; OCT optical coherence tomography; IOP intraocular pressure; BRVO Branch retinal vein occlusion; CRVO central retinal vein occlusion; CRAO central retinal artery occlusion; BRAO branch retinal artery occlusion; RT retinal tear; SB scleral buckle; PPV pars plana vitrectomy; VH Vitreous hemorrhage; PRP panretinal laser photocoagulation; IVK intravitreal kenalog; VMT vitreomacular traction; MH Macular hole;  NVD neovascularization of the disc; NVE neovascularization elsewhere; AREDS age related eye disease study; ARMD age related macular degeneration; POAG primary open angle glaucoma; EBMD epithelial/anterior basement membrane dystrophy; ACIOL anterior chamber intraocular lens; IOL intraocular lens; PCIOL posterior chamber intraocular lens; Phaco/IOL phacoemulsification with intraocular lens placement; PRK photorefractive keratectomy; LASIK laser assisted in situ keratomileusis; HTN hypertension; DM diabetes mellitus; COPD chronic obstructive pulmonary disease

## 2023-12-24 ENCOUNTER — Encounter (INDEPENDENT_AMBULATORY_CARE_PROVIDER_SITE_OTHER): Admitting: Ophthalmology

## 2023-12-24 NOTE — Progress Notes (Signed)
 [facility] - Clinic Note  12/26/2023     CHIEF COMPLAINT Patient presents for Retina Follow Up   HISTORY OF PRESENT ILLNESS: [name] is a 58 y.o. male who presents to the clinic today for:   HPI     Retina Follow Up   Patient presents with  Diabetic Retinopathy.  In both eyes.  Severity is moderate.  Duration of 6 weeks.  Since onset it is stable.  I, the attending physician,  performed the HPI with the patient and updated documentation appropriately.        Comments   Pt here for 6 wk ret f/u PDR OU. Pt states [facility] is stable OU. Pt had some lab work last week but hasn't received results. Pt reports having annual eye exam @ the [place]. Awaiting new rx specs.       Last edited by Valdemar Rogue, MD on 12/26/2023 12:06 PM.    Pt states his vision is doing well  Referring physician: Clinic, [facility] [address] [facility] [place],  [place] [address]  HISTORICAL INFORMATION:   Selected notes from the MEDICAL RECORD NUMBER Referred by [facility] for retinal hemes LEE:  Ocular Hx- PMH-    CURRENT MEDICATIONS: No current outpatient medications on file. (Ophthalmic Drugs)   No current facility-administered medications for this visit. (Ophthalmic Drugs)   Current Outpatient Medications (Other)  Medication Sig   AMLODIPINE BESYLATE PO Take 10 mg by mouth daily.   Cholecalciferol 25 MCG (1000 UT) capsule Take by mouth.   Docusate Sodium  (DSS) 100 MG CAPS Take by mouth.   doxycycline  (VIBRAMYCIN ) 100 MG capsule Take 1 capsule (100 mg total) by mouth 2 (two) times daily.   escitalopram (LEXAPRO) 10 MG tablet Take 10 mg by mouth daily.   furosemide  (LASIX ) 40 MG tablet Take 1 tablet (40 mg total) by mouth daily as needed.   gabapentin (NEURONTIN) 800 MG tablet Take 800 mg by mouth QID.   hydrOXYzine (VISTARIL) 25 MG capsule Take 25 mg by mouth 3 (three) times daily as needed. One tablet daily   ibuprofen (ADVIL) 800 MG tablet Take 800 mg by mouth  every 8 (eight) hours as needed.   loratadine  (CLARITIN ) 10 MG tablet Take by mouth.   losartan (COZAAR) 100 MG tablet Take 100 mg by mouth daily.   metFORMIN  (GLUCOPHAGE ) 500 MG tablet Take 1 tablet (500 mg total) by mouth 2 (two) times daily with a meal.   methocarbamol (ROBAXIN) 750 MG tablet TAKE ONE TABLET BY MOUTH TWICE A DAY AS NEEDED FOR MUSCLE SPASM   naloxone (NARCAN) nasal spray 4 mg/0.1 mL SPRAY 1 SPRAY INTO ONE NOSTRIL AS DIRECTED FOR OPIOID OVERDOSE - CALL 911 IMMEDIATELY, ADMINISTER DOSE, THEN TURN PERSON ON SIDE - IF NO RESPONSE IN 2-3 MINUTES OR PERSON RESPONDS BUT RELAPSES, REPEAT USING A NEW SPRAY DEVICE AND SPRAY INTO THE OTHER NOSTRIL   naproxen (NAPROSYN) 500 MG tablet Take 1 tablet by mouth 2 (two) times daily.   omeprazole (PRILOSEC) 40 MG capsule Take 40 mg by mouth daily.   oxyCODONE -acetaminophen  (PERCOCET) 10-325 MG tablet Take 1 tablet by mouth every 4 (four) hours as needed for pain. 1 q 6 hours and 1 at bedtime   polyethylene glycol (MIRALAX  / GLYCOLAX ) 17 g packet Take 17 g by mouth daily.   ranitidine  (ZANTAC ) 75 MG tablet Take by mouth.   rosuvastatin  (CRESTOR ) 10 MG tablet Take 1 tablet (10 mg total) by mouth daily.   Semaglutide (OZEMPIC,  1 MG/DOSE, ) Inject 1 mg into the skin once a week.   sertraline (ZOLOFT) 25 MG tablet Take 25 mg by mouth daily. Per patient is taking 1/2 tablet daily   spironolactone (ALDACTONE) 25 MG tablet Take 25 mg by mouth daily.   testosterone cypionate (DEPOTESTOTERONE CYPIONATE) 100 MG/ML injection Inject 100 mg into the muscle once a week.   triamcinolone cream (KENALOG) 0.1 % APPLY THIN LAYER TO AFFECTED AREA TWICE A DAY AS NEEDED FOR ECZEMA   zolpidem (AMBIEN) 10 MG tablet 1 tablet at bedtime as needed Orally Once a day   No current facility-administered medications for this visit. (Other)   REVIEW OF SYSTEMS: ROS   Positive for: Endocrine, Eyes Negative for: Constitutional, Gastrointestinal, Neurological, Skin,  Genitourinary, Musculoskeletal, HENT, Cardiovascular, Respiratory, Psychiatric, Allergic/Imm, Heme/Lymph Last edited by Antonetta Almetta BRAVO, COT on 12/26/2023  8:10 AM.      ALLERGIES Allergies  Allergen Reactions   Lisinopril Other (See Comments) and Hives    Other reaction(s): Electrocardiogram abnormal  Other Reaction(s): Electrocardiogram abnormal, Abdominal pain, Electrocardiogram abnormal, Abdominal pain   Sildenafil Nausea And Vomiting   PAST MEDICAL HISTORY Past Medical History:  Diagnosis Date   Arthritis    Diabetes mellitus    Fistula, perirectal    GERD (gastroesophageal reflux disease)    Hypertension    Osteomyelitis of great toe of right foot (HCC) 10/31/2021   Polymicrobial bacterial infection 10/31/2021   PTSD (post-traumatic stress disorder)    PTSD (post-traumatic stress disorder) 10/31/2021   Small bowel obstruction (HCC)    Past Surgical History:  Procedure Laterality Date   RECTAL SURGERY     FAMILY HISTORY Family History  Problem Relation Age of Onset   Heart attack Father    Obesity Father    SOCIAL HISTORY Social History   Tobacco Use   Smoking status: Some Days    Types: Cigars   Smokeless tobacco: Never   Tobacco comments:    Occasional cigar  Vaping Use   Vaping status: Never Used  Substance Use Topics   Alcohol use: Yes    Comment: occ   Drug use: No       OPHTHALMIC EXAM:  Base Eye Exam     Visual Acuity (Snellen - Linear)       Right Left   Dist cc 20/150 -2 20/40   Dist ph cc NI NI    Correction: Glasses         Tonometry (Tonopen, 8:14 AM)       Right Left   Pressure 15 13         Pupils       Pupils Dark Light Shape React APD   Right PERRL 3 2 Round Brisk None   Left PERRL 3 2 Round Brisk None         Visual Fields (Counting fingers)       Left Right    Full Full         Extraocular Movement       Right Left    Full, Ortho Full, Ortho         Neuro/Psych     Oriented x3: Yes    Mood/Affect: Normal         Dilation     Both eyes: 1.0% Mydriacyl, 2.5% Phenylephrine @ 8:15 AM           Slit Lamp and Fundus Exam     Slit Lamp Exam       Right  Left   Lids/Lashes Dermatochalasis - upper lid Dermatochalasis - upper lid   Conjunctiva/Sclera mild melanosis, nasal pingeucula mild melanosis, nasal and temporal pinguecula   Cornea arcus, 2+ Punctate epithelial erosions arcus, 2+inferior PEE, mild tear film debris   Anterior Chamber deep, clear, narrow temporal angle Deep and quiet   Iris Round and dilated, No NVI Round and dilated, No NVI   Lens 3+ Nuclear sclerosis with brunescence, 2-3+ Cortical cataract 3+ Nuclear sclerosis with brunescence, 3+ Cortical cataract   Anterior Vitreous Vitreous syneresis, blood stained vitreous condensations below disc -- improved Vitreous syneresis, +RBCs, blood stained vitreous condensations -- vastly improved and settling inferiorly -- turning white         Fundus Exam       Right Left   Disc Pink and Sharp, no NVD, Compact Pink and Sharp   C/D Ratio 0.2 0.2   Macula Flat, good foveal reflex, RPE mottling and clumping, scattered MA / DBH, trace cystic changes, scattered CWSs greatest superior mac Flat, Good foveal reflex, scattered MA and CWSs   Vessels attenuated, Tortuous attenuated, Tortuous   Periphery Attached, scattered DBH and CWS posteriorly, light PRP changes 360 Attached, scattered DBH and CWS posteriorly, inferior retina obscured by [facility]           Refraction     Wearing Rx       Sphere Cylinder Axis Add   Right +0.75 Sphere  +2.50   Left +1.75 +0.50 170 +2.50           IMAGING AND PROCEDURES  Imaging and Procedures for 12/26/2023  OCT, Retina - OU - Both Eyes       Right Eye Quality was good. Progression has been stable. Findings include normal foveal contour, no IRF, no SRF, outer retinal atrophy, vitreomacular adhesion (central ellipsoid signal disruptions and thinning; vitreous opacities --  stably improved; irregular lamination, mild scattered IRHM, trace cystic changes).   Left Eye Quality was good. Central Foveal Thickness: 264. Progression has been stable. Findings include normal foveal contour, no SRF, intraretinal hyper-reflective material, intraretinal fluid (Interval improvement in vitreous opacities / VH, trace IRF / cystic changes, focal IRA temporal macula).   Notes *Images captured and stored on drive  Diagnosis / Impression:  OD: central ellipsoid signal disruptions and thinning; vitreous opacities -- stably improved; irregular lamination, mild scattered IRHM, trace cystic changes OS: Interval improvement in vitreous opacities / VH, trace IRF / cystic changes, focal IRA temporal macula  Clinical management:  See below  Abbreviations: NFP - Normal foveal profile. CME - cystoid macular edema. PED - pigment epithelial detachment. IRF - intraretinal fluid. SRF - subretinal fluid. EZ - ellipsoid zone. ERM - epiretinal membrane. ORA - outer retinal atrophy. ORT - outer retinal tubulation. SRHM - subretinal hyper-reflective material. IRHM - intraretinal hyper-reflective material      Intravitreal Injection, Pharmacologic Agent - OD - Right Eye       Time Out 12/26/2023. 8:49 AM. Confirmed correct patient, procedure, site, and patient consented.   Anesthesia Topical anesthesia was used. Anesthetic medications included Lidocaine  2%, Proparacaine 0.5%.   Procedure Preparation included 5% betadine to ocular surface, eyelid speculum. A (32g) needle was used.   Injection: 1.25 mg Bevacizumab  1.25mg /0.101ml   Route: Intravitreal, Site: Right Eye   NDC: C2662926, Lot: [id number], Expiration date: 07/18/2024   Post-op Post injection exam found visual acuity of at least counting fingers. The patient tolerated the procedure well. There were no complications. The patient received written and verbal post  procedure care education.      Intravitreal Injection, Pharmacologic  Agent - OS - Left Eye       Time Out 12/26/2023. 8:50 AM. Confirmed correct patient, procedure, site, and patient consented.   Anesthesia Topical anesthesia was used. Anesthetic medications included Lidocaine  2%, Proparacaine 0.5%.   Procedure Preparation included 5% betadine to ocular surface, eyelid speculum. A (33g) needle was used.   Injection: 1.25 mg Bevacizumab  1.25mg /0.32ml   Route: Intravitreal, Site: Left Eye   NDC: H525437, Lot: [id number], Expiration date: 03/30/2024   Post-op Post injection exam found visual acuity of at least counting fingers. The patient tolerated the procedure well. There were no complications. The patient received written and verbal post procedure care education. Post injection medications were not given.            ASSESSMENT/PLAN:    ICD-10-CM   1. 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 Vitreous hemorrhage of both eyes (HCC)  H43.13     3. 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 Long-term (current) use of injectable non-insulin  antidiabetic drugs  Z79.85     5. Essential hypertension  I10     6. Hypertensive retinopathy of both eyes  H35.033     7. Retinal ischemia  H35.82     8. Combined forms of age-related cataract of both eyes  H25.813      1-4.  Proliferative diabetic retinopathy OD, Severe non-proliferative diabetic retinopathy, OS  ** New VH OS noted 04.11.25 exam**  - A1c: 8.4 on 03.28.24  - pt lost to follow up from 06.16.23 to 11.14.23 - pt reports recent improvement in A1c from 11 down to 6 since  starting Ozempic - s/p IVA OS #1 (03.22.23), #2 (04.21.23), #3 (05.19.23), #4 (06.16.23), #5 (11.15.23), #6 (12.22.24), # 7 (01.26.24), #8 (03.15.24), #9 (05.10.24), #10 (06.28.24), #11 (08.12.24), #12 (10.16.24), #13 (12.17.24), #14 (02.11.25) , #15 (04.11.25), #16 (05.13.25) - s/p IVA OD #1 (11.15.2), #2 (12.22.23), #3 (01.26.24), #4 (03.15.24), #5 (05.10.24), #6 (06.28.24), #7 (12.17.24), #8 (02.11.25), #9 (02.11.25), #10 (04.11.25), #11 (05.13.25) - s/p PRP OD (04.22.24) - new VH OD noted on 11.14.23 visit  - FA (11.14.23) OD: Enlarged FAZ, scattered patches of vascular non-perfusion greatest temporal periphery, mild perivascular leakage temporal periphery and from MA, focal early NVE superonasal midzone; OS: Mild enlargement of FAZ, scattered patches of vascular non-perfusion greatest temporal periphery, mild perivascular leakage temporal periphery and from MA; no NV -- pt would benefit from PRP OD -- completed 04.22.24 - BCVA OD 20/150 from 20/100 (severe retinal ischemia + cataract); OS 20/40 from 20/30 - OCT shows OD: central ellipsoid signal disruptions and thinning; vitreous opacities -- stably improved; irregular lamination, mild scattered IRHM, trace cystic changes; OS: Interval improvement in vitreous opacities / VH, trace IRF / cystic changes, focal IRA temporal macula at 6 wks - recommend IVA OU (OD #12 and OS #17) today, 06.26.25 w/ f/u in 5-6 wks - pt in agreement - RBA of procedure discussed, questions answered - IVA informed consent obtained and signed, 11.16.23 (OU) -  see procedure note - f/u 5-6 weeks - DFE, OCT, possible injxns  5-7. Hypertensive retinopathy w/ retinal ischemia OU - BP uncontrolled at initial consult -- 170s / 110-120s in office 12.23.22, BP was significantly improved to 147/80 at f/u visit - FA 12.23.22 shows significant retinal ischemia OU -- enlarged FAZ OU (OD> OS) and significant patches of capillary drop out OU (OD>OS)  8. Mixed Cataract OU - The  symptoms of cataract, surgical options, and treatments and risks were discussed with patient. - discussed diagnosis and progression - visually significant  - [facility] / Dr. Marcey for cat eval and establishment of primary eye care -- [facility]  Ophthalmic Meds Ordered this visit:  Meds ordered this encounter  Medications   Bevacizumab  (AVASTIN ) SOLN 1.25 mg   Bevacizumab  (AVASTIN ) SOLN 1.25 mg     Return for f/u 5-6 weeks, PDR OU, DFE, OCT, Possible Injxn.  This document serves as a record of services personally performed by Redell JUDITHANN Hans, MD, PhD. It was created on their behalf by Alan PARAS. Delores, OA an ophthalmic technician. The creation of this record is the provider's dictation and/or activities during the visit.    Electronically signed by: Alan PARAS. Delores, OA 12/26/23 12:08 PM  Redell JUDITHANN Hans, M.D., Ph.D. Diseases & Surgery of the Retina and Vitreous [facility]  I have reviewed the above documentation for accuracy and completeness, and I agree with the above. Redell JUDITHANN Hans, M.D., Ph.D. 12/26/23 12:09 PM   Abbreviations: M myopia (nearsighted); A astigmatism; H hyperopia (farsighted); P presbyopia; Mrx spectacle prescription;  CTL contact lenses; OD right eye; OS left eye; OU both eyes  XT exotropia; ET esotropia; PEK punctate epithelial keratitis; PEE punctate epithelial erosions; DES dry eye syndrome; MGD meibomian gland dysfunction; ATs artificial tears; PFAT's preservative free artificial tears; NSC nuclear sclerotic cataract; PSC posterior subcapsular cataract; ERM epi-retinal membrane; PVD posterior vitreous detachment; RD retinal detachment; DM diabetes mellitus; DR diabetic retinopathy; NPDR non-proliferative diabetic retinopathy; PDR proliferative diabetic retinopathy; CSME clinically significant macular edema; DME diabetic macular edema; dbh dot blot hemorrhages; CWS cotton wool spot; POAG  primary open angle glaucoma; C/D cup-to-disc ratio; HVF humphrey visual field; GVF goldmann visual field; OCT optical coherence tomography; IOP intraocular pressure; BRVO Branch retinal vein occlusion; CRVO central retinal vein occlusion; CRAO central retinal artery occlusion; BRAO branch retinal artery occlusion; RT retinal tear; SB scleral buckle; PPV pars plana vitrectomy; VH Vitreous hemorrhage; PRP panretinal laser photocoagulation; IVK intravitreal kenalog; VMT vitreomacular traction; MH Macular hole;  NVD neovascularization of the disc; NVE neovascularization elsewhere; AREDS age related eye disease study; ARMD age related macular degeneration; POAG primary open angle glaucoma; EBMD epithelial/anterior basement membrane dystrophy; ACIOL anterior chamber intraocular lens; IOL intraocular lens; PCIOL posterior chamber intraocular lens; Phaco/IOL phacoemulsification with intraocular lens placement; PRK photorefractive keratectomy; LASIK laser assisted in situ keratomileusis; HTN hypertension; DM diabetes mellitus; COPD chronic obstructive pulmonary disease

## 2023-12-25 ENCOUNTER — Encounter (INDEPENDENT_AMBULATORY_CARE_PROVIDER_SITE_OTHER): Admitting: Ophthalmology

## 2023-12-25 DIAGNOSIS — H25813 Combined forms of age-related cataract, bilateral: Secondary | ICD-10-CM

## 2023-12-25 DIAGNOSIS — E113412 Type 2 diabetes mellitus with severe nonproliferative diabetic retinopathy with macular edema, left eye: Secondary | ICD-10-CM

## 2023-12-25 DIAGNOSIS — H3582 Retinal ischemia: Secondary | ICD-10-CM

## 2023-12-25 DIAGNOSIS — E113511 Type 2 diabetes mellitus with proliferative diabetic retinopathy with macular edema, right eye: Secondary | ICD-10-CM

## 2023-12-25 DIAGNOSIS — I10 Essential (primary) hypertension: Secondary | ICD-10-CM

## 2023-12-25 DIAGNOSIS — H4313 Vitreous hemorrhage, bilateral: Secondary | ICD-10-CM

## 2023-12-25 DIAGNOSIS — Z7985 Long-term (current) use of injectable non-insulin antidiabetic drugs: Secondary | ICD-10-CM

## 2023-12-25 DIAGNOSIS — H35033 Hypertensive retinopathy, bilateral: Secondary | ICD-10-CM

## 2023-12-26 ENCOUNTER — Ambulatory Visit (INDEPENDENT_AMBULATORY_CARE_PROVIDER_SITE_OTHER): Admitting: Ophthalmology

## 2023-12-26 ENCOUNTER — Encounter (INDEPENDENT_AMBULATORY_CARE_PROVIDER_SITE_OTHER): Payer: Self-pay | Admitting: Ophthalmology

## 2023-12-26 DIAGNOSIS — E113412 Type 2 diabetes mellitus with severe nonproliferative diabetic retinopathy with macular edema, left eye: Secondary | ICD-10-CM

## 2023-12-26 DIAGNOSIS — H35033 Hypertensive retinopathy, bilateral: Secondary | ICD-10-CM

## 2023-12-26 DIAGNOSIS — H4313 Vitreous hemorrhage, bilateral: Secondary | ICD-10-CM

## 2023-12-26 DIAGNOSIS — E113511 Type 2 diabetes mellitus with proliferative diabetic retinopathy with macular edema, right eye: Secondary | ICD-10-CM | POA: Diagnosis not present

## 2023-12-26 DIAGNOSIS — Z7985 Long-term (current) use of injectable non-insulin antidiabetic drugs: Secondary | ICD-10-CM | POA: Diagnosis not present

## 2023-12-26 DIAGNOSIS — I10 Essential (primary) hypertension: Secondary | ICD-10-CM

## 2023-12-26 DIAGNOSIS — H25813 Combined forms of age-related cataract, bilateral: Secondary | ICD-10-CM

## 2023-12-26 DIAGNOSIS — H3582 Retinal ischemia: Secondary | ICD-10-CM

## 2023-12-26 DIAGNOSIS — H4311 Vitreous hemorrhage, right eye: Secondary | ICD-10-CM

## 2023-12-26 MED ORDER — BEVACIZUMAB CHEMO INJECTION 1.25MG/0.05ML SYRINGE FOR KALEIDOSCOPE
1.2500 mg | INTRAVITREAL | Status: AC | PRN
  Administered 2023-12-26: 1.25 mg via INTRAVITREAL

## 2024-01-27 NOTE — Progress Notes (Shared)
 [facility] - Clinic Note  02/06/2024     CHIEF COMPLAINT Patient presents for No chief complaint on file.   HISTORY OF PRESENT ILLNESS: [name] is a 58 y.o. male who presents to the clinic today for:    Pt states his vision is doing well  Referring physician: Clinic, Bonni Lien [address] [facility] [place],  [place] [address]  HISTORICAL INFORMATION:   Selected notes from the MEDICAL RECORD NUMBER Referred by [facility] for retinal hemes LEE:  Ocular Hx- PMH-    CURRENT MEDICATIONS: No current outpatient medications on file. (Ophthalmic Drugs)   No current facility-administered medications for this visit. (Ophthalmic Drugs)   Current Outpatient Medications (Other)  Medication Sig   AMLODIPINE BESYLATE PO Take 10 mg by mouth daily.   Cholecalciferol 25 MCG (1000 UT) capsule Take by mouth.   Docusate Sodium  (DSS) 100 MG CAPS Take by mouth.   doxycycline  (VIBRAMYCIN ) 100 MG capsule Take 1 capsule (100 mg total) by mouth 2 (two) times daily.   escitalopram (LEXAPRO) 10 MG tablet Take 10 mg by mouth daily.   furosemide  (LASIX ) 40 MG tablet Take 1 tablet (40 mg total) by mouth daily as needed.   gabapentin (NEURONTIN) 800 MG tablet Take 800 mg by mouth QID.   hydrOXYzine (VISTARIL) 25 MG capsule Take 25 mg by mouth 3 (three) times daily as needed. One tablet daily   ibuprofen (ADVIL) 800 MG tablet Take 800 mg by mouth every 8 (eight) hours as needed.   loratadine  (CLARITIN ) 10 MG tablet Take by mouth.   losartan (COZAAR) 100 MG tablet Take 100 mg by mouth daily.   metFORMIN  (GLUCOPHAGE ) 500 MG tablet Take 1 tablet (500 mg total) by mouth 2 (two) times daily with a meal.   methocarbamol (ROBAXIN) 750 MG tablet TAKE ONE TABLET BY MOUTH TWICE A DAY AS NEEDED FOR MUSCLE SPASM   naloxone (NARCAN) nasal spray 4 mg/0.1 mL SPRAY 1 SPRAY INTO ONE NOSTRIL AS DIRECTED FOR OPIOID OVERDOSE - CALL 911 IMMEDIATELY, ADMINISTER DOSE, THEN TURN PERSON ON SIDE -  IF NO RESPONSE IN 2-3 MINUTES OR PERSON RESPONDS BUT RELAPSES, REPEAT USING A NEW SPRAY DEVICE AND SPRAY INTO THE OTHER NOSTRIL   naproxen (NAPROSYN) 500 MG tablet Take 1 tablet by mouth 2 (two) times daily.   omeprazole (PRILOSEC) 40 MG capsule Take 40 mg by mouth daily.   oxyCODONE -acetaminophen  (PERCOCET) 10-325 MG tablet Take 1 tablet by mouth every 4 (four) hours as needed for pain. 1 q 6 hours and 1 at bedtime   polyethylene glycol (MIRALAX  / GLYCOLAX ) 17 g packet Take 17 g by mouth daily.   ranitidine  (ZANTAC ) 75 MG tablet Take by mouth.   rosuvastatin  (CRESTOR ) 10 MG tablet Take 1 tablet (10 mg total) by mouth daily.   Semaglutide (OZEMPIC, 1 MG/DOSE, [place]) Inject 1 mg into the skin once a week.   sertraline (ZOLOFT) 25 MG tablet Take 25 mg by mouth daily. Per patient is taking 1/2 tablet daily   spironolactone (ALDACTONE) 25 MG tablet Take 25 mg by mouth daily.   testosterone cypionate (DEPOTESTOTERONE CYPIONATE) 100 MG/ML injection Inject 100 mg into the muscle once a week.   triamcinolone cream (KENALOG) 0.1 % APPLY THIN LAYER TO AFFECTED AREA TWICE A DAY AS NEEDED FOR ECZEMA   zolpidem (AMBIEN) 10 MG tablet 1 tablet at bedtime as needed Orally Once a day   No current facility-administered medications for this visit. (Other)   REVIEW OF SYSTEMS:  ALLERGIES Allergies  Allergen Reactions   Lisinopril Other (See Comments) and Hives    Other reaction(s): Electrocardiogram abnormal  Other Reaction(s): Electrocardiogram abnormal, Abdominal pain, Electrocardiogram abnormal, Abdominal pain   Sildenafil Nausea And Vomiting   PAST MEDICAL HISTORY Past Medical History:  Diagnosis Date   Arthritis    Diabetes mellitus    Fistula, perirectal    GERD (gastroesophageal reflux disease)    Hypertension    Osteomyelitis of great toe of right foot (HCC) 10/31/2021   Polymicrobial bacterial infection 10/31/2021   PTSD (post-traumatic stress disorder)    PTSD (post-traumatic stress  disorder) 10/31/2021   Small bowel obstruction (HCC)    Past Surgical History:  Procedure Laterality Date   RECTAL SURGERY     FAMILY HISTORY Family History  Problem Relation Age of Onset   Heart attack Father    Obesity Father    SOCIAL HISTORY Social History   Tobacco Use   Smoking status: Some Days    Types: Cigars   Smokeless tobacco: Never   Tobacco comments:    Occasional cigar  Vaping Use   Vaping status: Never Used  Substance Use Topics   Alcohol use: Yes    Comment: occ   Drug use: No       OPHTHALMIC EXAM:  Not recorded    IMAGING AND PROCEDURES  Imaging and Procedures for 02/06/2024          ASSESSMENT/PLAN:    ICD-10-CM   1. 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 Vitreous hemorrhage of both eyes (HCC)  H43.13     3. 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 Long-term (current) use of injectable non-insulin  antidiabetic drugs  Z79.85     5. Essential hypertension  I10     6. Hypertensive retinopathy of both eyes  H35.033     7. Retinal ischemia  H35.82     8. Combined forms of age-related cataract of both eyes  H25.813       1-4.  Proliferative diabetic retinopathy OD, Severe non-proliferative diabetic retinopathy, OS  ** New VH OS noted 04.11.25 exam**  - A1c: 8.4 on 03.28.24  - pt lost to follow up from 06.16.23 to 11.14.23 - pt reports recent improvement in A1c from 11 down to 6 since starting Ozempic - s/p IVA OS #1 (03.22.23), #2 (04.21.23), #3 (05.19.23), #4 (06.16.23), #5 (11.15.23), #6 (12.22.24), # 7 (01.26.24), #8 (03.15.24), #9 (05.10.24), #10 (06.28.24), #11 (08.12.24), #12 (10.16.24), #13 (12.17.24), #14 (02.11.25) , #15 (04.11.25), #16 (05.13.25), #17 (06.26.25) - s/p IVA OD #1 (11.15.2), #2 (12.22.23), #3 (01.26.24), #4 (03.15.24), #5  (05.10.24), #6 (06.28.24), #7 (12.17.24), #8 (02.11.25), #9 (02.11.25), #10 (04.11.25), #11 (05.13.25), #12 (06.26.25) - s/p PRP OD (04.22.24) - new VH OD noted on 11.14.23 visit  - FA (11.14.23) OD: Enlarged FAZ, scattered patches of vascular non-perfusion greatest temporal periphery, mild perivascular leakage temporal periphery and from MA, focal early NVE superonasal midzone; OS: Mild enlargement of FAZ, scattered patches of vascular non-perfusion greatest temporal periphery, mild perivascular leakage temporal periphery and from MA; no NV -- pt would benefit from PRP OD -- completed 04.22.24 - BCVA OD 20/150 from 20/100 (severe retinal ischemia + cataract); OS 20/40 from 20/30 - OCT shows OD: central ellipsoid signal disruptions and thinning; vitreous opacities -- stably improved; irregular lamination,  mild scattered IRHM, trace cystic changes; OS: Interval improvement in vitreous opacities / VH, trace IRF / cystic changes, focal IRA temporal macula at 6 wks - recommend IVA OU (OD #13 and OS #18) today, 08.07.25 w/ f/u in 5-6 wks - pt in agreement - RBA of procedure discussed, questions answered - IVA informed consent obtained and signed, 11.16.23 (OU) - see procedure note - f/u 5-6 weeks - DFE, OCT, possible injection(s)  5-7. Hypertensive retinopathy w/ retinal ischemia OU - BP uncontrolled at initial consult -- 170s / 110-120s in office 12.23.22, BP was significantly improved to 147/80 at f/u visit - FA 12.23.22 shows significant retinal ischemia OU -- enlarged FAZ OU (OD> OS) and significant patches of capillary drop out OU (OD>OS)  8. Mixed Cataract OU - The symptoms of cataract, surgical options, and treatments and risks were discussed with patient. - discussed diagnosis and progression - visually significant  - referred to [facility] / Dr. Marcey for cat eval and establishment of primary [facility] care -- [facility]  Ophthalmic Meds Ordered this  visit:  No orders of the defined types were placed in this encounter.    No follow-ups on file.  This document serves as a record of services personally performed by Redell JUDITHANN Hans, MD, PhD. It was created on their behalf by Avelina Pereyra, COA an ophthalmic technician. The creation of this record is the provider's dictation and/or activities during the visit.   Electronically signed by: Avelina GORMAN Pereyra, COT  01/27/24  3:42 PM    Redell JUDITHANN Hans, M.D., Ph.D. Diseases & Surgery of the Retina and Vitreous [facility]    Abbreviations: M myopia (nearsighted); A astigmatism; H hyperopia (farsighted); P presbyopia; Mrx spectacle prescription;  CTL contact lenses; OD right eye; OS left eye; OU both eyes  XT exotropia; ET esotropia; PEK punctate epithelial keratitis; PEE punctate epithelial erosions; DES dry eye syndrome; MGD meibomian gland dysfunction; ATs artificial tears; PFAT's preservative free artificial tears; NSC nuclear sclerotic cataract; PSC posterior subcapsular cataract; ERM epi-retinal membrane; PVD posterior vitreous detachment; RD retinal detachment; DM diabetes mellitus; DR diabetic retinopathy; NPDR non-proliferative diabetic retinopathy; PDR proliferative diabetic retinopathy; CSME clinically significant macular edema; DME diabetic macular edema; dbh dot blot hemorrhages; CWS cotton wool spot; POAG primary open angle glaucoma; C/D cup-to-disc ratio; HVF humphrey visual field; GVF goldmann visual field; OCT optical coherence tomography; IOP intraocular pressure; BRVO Branch retinal vein occlusion; CRVO central retinal vein occlusion; CRAO central retinal artery occlusion; BRAO branch retinal artery occlusion; RT retinal tear; SB scleral buckle; PPV pars plana vitrectomy; VH Vitreous hemorrhage; PRP panretinal laser photocoagulation; IVK intravitreal kenalog; VMT vitreomacular traction; MH Macular hole;  NVD neovascularization of the disc; NVE neovascularization  elsewhere; AREDS age related eye disease study; ARMD age related macular degeneration; POAG primary open angle glaucoma; EBMD epithelial/anterior basement membrane dystrophy; ACIOL anterior chamber intraocular lens; IOL intraocular lens; PCIOL posterior chamber intraocular lens; Phaco/IOL phacoemulsification with intraocular lens placement; PRK photorefractive keratectomy; LASIK laser assisted in situ keratomileusis; HTN hypertension; DM diabetes mellitus; COPD chronic obstructive pulmonary disease

## 2024-02-03 ENCOUNTER — Telehealth: Payer: Self-pay | Admitting: Podiatry

## 2024-02-03 NOTE — Telephone Encounter (Signed)
Called to get patient scheduled.

## 2024-02-06 ENCOUNTER — Encounter (INDEPENDENT_AMBULATORY_CARE_PROVIDER_SITE_OTHER): Admitting: Ophthalmology

## 2024-02-06 DIAGNOSIS — E113511 Type 2 diabetes mellitus with proliferative diabetic retinopathy with macular edema, right eye: Secondary | ICD-10-CM

## 2024-02-06 DIAGNOSIS — E113412 Type 2 diabetes mellitus with severe nonproliferative diabetic retinopathy with macular edema, left eye: Secondary | ICD-10-CM

## 2024-02-06 DIAGNOSIS — H3582 Retinal ischemia: Secondary | ICD-10-CM

## 2024-02-06 DIAGNOSIS — H35033 Hypertensive retinopathy, bilateral: Secondary | ICD-10-CM

## 2024-02-06 DIAGNOSIS — Z7985 Long-term (current) use of injectable non-insulin antidiabetic drugs: Secondary | ICD-10-CM

## 2024-02-06 DIAGNOSIS — H25813 Combined forms of age-related cataract, bilateral: Secondary | ICD-10-CM

## 2024-02-06 DIAGNOSIS — H4313 Vitreous hemorrhage, bilateral: Secondary | ICD-10-CM

## 2024-02-06 DIAGNOSIS — I10 Essential (primary) hypertension: Secondary | ICD-10-CM

## 2024-02-20 ENCOUNTER — Encounter (HOSPITAL_BASED_OUTPATIENT_CLINIC_OR_DEPARTMENT_OTHER): Attending: Internal Medicine | Admitting: Internal Medicine

## 2024-02-20 DIAGNOSIS — E11621 Type 2 diabetes mellitus with foot ulcer: Secondary | ICD-10-CM | POA: Diagnosis present

## 2024-02-20 DIAGNOSIS — L97522 Non-pressure chronic ulcer of other part of left foot with fat layer exposed: Secondary | ICD-10-CM | POA: Insufficient documentation

## 2024-03-03 ENCOUNTER — Encounter (HOSPITAL_BASED_OUTPATIENT_CLINIC_OR_DEPARTMENT_OTHER): Admitting: Internal Medicine

## 2024-03-04 ENCOUNTER — Encounter (HOSPITAL_COMMUNITY): Payer: Self-pay

## 2024-03-04 ENCOUNTER — Ambulatory Visit (HOSPITAL_BASED_OUTPATIENT_CLINIC_OR_DEPARTMENT_OTHER): Admitting: Internal Medicine

## 2024-03-04 ENCOUNTER — Encounter (HOSPITAL_COMMUNITY)

## 2024-03-11 ENCOUNTER — Encounter (HOSPITAL_BASED_OUTPATIENT_CLINIC_OR_DEPARTMENT_OTHER): Admitting: Internal Medicine

## 2024-03-11 DIAGNOSIS — L97522 Non-pressure chronic ulcer of other part of left foot with fat layer exposed: Secondary | ICD-10-CM | POA: Insufficient documentation

## 2024-03-11 DIAGNOSIS — E11621 Type 2 diabetes mellitus with foot ulcer: Secondary | ICD-10-CM | POA: Insufficient documentation

## 2024-03-12 ENCOUNTER — Encounter (HOSPITAL_BASED_OUTPATIENT_CLINIC_OR_DEPARTMENT_OTHER): Attending: Internal Medicine | Admitting: Internal Medicine

## 2024-03-12 DIAGNOSIS — L97522 Non-pressure chronic ulcer of other part of left foot with fat layer exposed: Secondary | ICD-10-CM | POA: Diagnosis not present

## 2024-03-12 DIAGNOSIS — E11621 Type 2 diabetes mellitus with foot ulcer: Secondary | ICD-10-CM | POA: Diagnosis not present

## 2024-03-16 NOTE — Progress Notes (Signed)
 [facility] - Clinic Note  03/19/2024     CHIEF COMPLAINT Patient presents for No chief complaint on file.   HISTORY OF PRESENT ILLNESS: [name] is a 58 y.o. male who presents to the clinic today for:    Pt states his vision is doing well  Referring physician: Clinic, Bonni Lien [address] [facility] [place],  [place] [address]  HISTORICAL INFORMATION:   Selected notes from the MEDICAL RECORD NUMBER Referred by [facility] for retinal hemes LEE:  Ocular Hx- PMH-    CURRENT MEDICATIONS: No current outpatient medications on file. (Ophthalmic Drugs)   No current facility-administered medications for this visit. (Ophthalmic Drugs)   Current Outpatient Medications (Other)  Medication Sig   AMLODIPINE BESYLATE PO Take 10 mg by mouth daily.   Cholecalciferol 25 MCG (1000 UT) capsule Take by mouth.   Docusate Sodium  (DSS) 100 MG CAPS Take by mouth.   doxycycline  (VIBRAMYCIN ) 100 MG capsule Take 1 capsule (100 mg total) by mouth 2 (two) times daily.   escitalopram (LEXAPRO) 10 MG tablet Take 10 mg by mouth daily.   furosemide  (LASIX ) 40 MG tablet Take 1 tablet (40 mg total) by mouth daily as needed.   gabapentin (NEURONTIN) 800 MG tablet Take 800 mg by mouth QID.   hydrOXYzine (VISTARIL) 25 MG capsule Take 25 mg by mouth 3 (three) times daily as needed. One tablet daily   ibuprofen (ADVIL) 800 MG tablet Take 800 mg by mouth every 8 (eight) hours as needed.   loratadine  (CLARITIN ) 10 MG tablet Take by mouth.   losartan (COZAAR) 100 MG tablet Take 100 mg by mouth daily.   metFORMIN  (GLUCOPHAGE ) 500 MG tablet Take 1 tablet (500 mg total) by mouth 2 (two) times daily with a meal.   methocarbamol (ROBAXIN) 750 MG tablet TAKE ONE TABLET BY MOUTH TWICE A DAY AS NEEDED FOR MUSCLE SPASM   naloxone (NARCAN) nasal spray 4 mg/0.1 mL SPRAY 1 SPRAY INTO ONE NOSTRIL AS DIRECTED FOR OPIOID OVERDOSE - CALL 911 IMMEDIATELY, ADMINISTER DOSE, THEN TURN PERSON ON SIDE  - IF NO RESPONSE IN 2-3 MINUTES OR PERSON RESPONDS BUT RELAPSES, REPEAT USING A NEW SPRAY DEVICE AND SPRAY INTO THE OTHER NOSTRIL   naproxen (NAPROSYN) 500 MG tablet Take 1 tablet by mouth 2 (two) times daily.   omeprazole (PRILOSEC) 40 MG capsule Take 40 mg by mouth daily.   oxyCODONE -acetaminophen  (PERCOCET) 10-325 MG tablet Take 1 tablet by mouth every 4 (four) hours as needed for pain. 1 q 6 hours and 1 at bedtime   polyethylene glycol (MIRALAX  / GLYCOLAX ) 17 g packet Take 17 g by mouth daily.   ranitidine  (ZANTAC ) 75 MG tablet Take by mouth.   rosuvastatin  (CRESTOR ) 10 MG tablet Take 1 tablet (10 mg total) by mouth daily.   Semaglutide (OZEMPIC, 1 MG/DOSE, [place]) Inject 1 mg into the skin once a week.   sertraline (ZOLOFT) 25 MG tablet Take 25 mg by mouth daily. Per patient is taking 1/2 tablet daily   spironolactone (ALDACTONE) 25 MG tablet Take 25 mg by mouth daily.   testosterone cypionate (DEPOTESTOTERONE CYPIONATE) 100 MG/ML injection Inject 100 mg into the muscle once a week.   triamcinolone cream (KENALOG) 0.1 % APPLY THIN LAYER TO AFFECTED AREA TWICE A DAY AS NEEDED FOR ECZEMA   zolpidem (AMBIEN) 10 MG tablet 1 tablet at bedtime as needed Orally Once a day   No current facility-administered medications for this visit. (Other)   REVIEW OF SYSTEMS:  ALLERGIES Allergies  Allergen Reactions   Lisinopril Other (See Comments) and Hives    Other reaction(s): Electrocardiogram abnormal  Other Reaction(s): Electrocardiogram abnormal, Abdominal pain, Electrocardiogram abnormal, Abdominal pain   Sildenafil Nausea And Vomiting   PAST MEDICAL HISTORY Past Medical History:  Diagnosis Date   Arthritis    Diabetes mellitus    Fistula, perirectal    GERD (gastroesophageal reflux disease)    Hypertension    Osteomyelitis of great toe of right foot (HCC) 10/31/2021   Polymicrobial bacterial infection 10/31/2021   PTSD (post-traumatic stress disorder)    PTSD (post-traumatic stress  disorder) 10/31/2021   Small bowel obstruction (HCC)    Past Surgical History:  Procedure Laterality Date   RECTAL SURGERY     FAMILY HISTORY Family History  Problem Relation Age of Onset   Heart attack Father    Obesity Father    SOCIAL HISTORY Social History   Tobacco Use   Smoking status: Some Days    Types: Cigars   Smokeless tobacco: Never   Tobacco comments:    Occasional cigar  Vaping Use   Vaping status: Never Used  Substance Use Topics   Alcohol use: Yes    Comment: occ   Drug use: No       OPHTHALMIC EXAM:  Not recorded    IMAGING AND PROCEDURES  Imaging and Procedures for 03/19/2024          ASSESSMENT/PLAN:    ICD-10-CM   1. 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 Vitreous hemorrhage of both eyes (HCC)  H43.13     3. 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 Long-term (current) use of injectable non-insulin  antidiabetic drugs  Z79.85     5. Essential hypertension  I10     6. Hypertensive retinopathy of both eyes  H35.033     7. Retinal ischemia  H35.82     8. Combined forms of age-related cataract of both eyes  H25.813       1-4.  Proliferative diabetic retinopathy OD, Severe non-proliferative diabetic retinopathy, OS  ** New VH OS noted 04.11.25 exam**  - A1c: 8.4 on 03.28.24  - pt lost to follow up from 06.16.23 to 11.14.23 - pt reports recent improvement in A1c from 11 down to 6 since starting Ozempic - s/p IVA OS #1 (03.22.23), #2 (04.21.23), #3 (05.19.23), #4 (06.16.23), #5 (11.15.23), #6 (12.22.24), # 7 (01.26.24), #8 (03.15.24), #9 (05.10.24), #10 (06.28.24), #11 (08.12.24), #12 (10.16.24), #13 (12.17.24), #14 (02.11.25) , #15 (04.11.25), #16 (05.13.25), #17 (06.26.25) - s/p IVA OD #1 (11.15.2), #2 (12.22.23), #3 (01.26.24), #4 (03.15.24), #5  (05.10.24), #6 (06.28.24), #7 (12.17.24), #8 (02.11.25), #9 (02.11.25), #10 (04.11.25), #11 (05.13.25), #12 (06.26.25) - s/p PRP OD (04.22.24) - new VH OD noted on 11.14.23 visit  - FA (11.14.23) OD: Enlarged FAZ, scattered patches of vascular non-perfusion greatest temporal periphery, mild perivascular leakage temporal periphery and from MA, focal early NVE superonasal midzone; OS: Mild enlargement of FAZ, scattered patches of vascular non-perfusion greatest temporal periphery, mild perivascular leakage temporal periphery and from MA; no NV -- pt would benefit from PRP OD -- completed 04.22.24 - BCVA OD 20/150 from 20/100 (severe retinal ischemia + cataract); OS 20/40 from 20/30 - OCT shows OD: central ellipsoid signal disruptions and thinning; vitreous opacities -- stably improved; irregular lamination,  mild scattered IRHM, trace cystic changes; OS: Interval improvement in vitreous opacities / VH, trace IRF / cystic changes, focal IRA temporal macula at 6 wks - recommend IVA OU (OD #13 and OS #18) today, 09.18.25 w/ f/u in 5-6 wks - pt in agreement - RBA of procedure discussed, questions answered - IVA informed consent obtained and signed, 11.16.23 (OU) - see procedure note - f/u 5-6 weeks - DFE, OCT, possible injxns  5-7. Hypertensive retinopathy w/ retinal ischemia OU - BP uncontrolled at initial consult -- 170s / 110-120s in office 12.23.22, BP was significantly improved to 147/80 at f/u visit - FA 12.23.22 shows significant retinal ischemia OU -- enlarged FAZ OU (OD> OS) and significant patches of capillary drop out OU (OD>OS)  8. Mixed Cataract OU - The symptoms of cataract, surgical options, and treatments and risks were discussed with patient. - discussed diagnosis and progression - visually significant   - referred to [facility] / Dr. Marcey for cat eval and establishment of primary eye care -- [facility]  Ophthalmic Meds Ordered this visit:   No orders of the defined types were placed in this encounter.    No follow-ups on file.  This document serves as a record of services personally performed by Redell JUDITHANN Hans, MD, PhD. It was created on their behalf by Auston Muzzy, COMT. The creation of this record is the provider's dictation and/or activities during the visit.  Electronically signed by: Auston Muzzy, COMT 03/16/24 10:34 AM    Redell JUDITHANN Hans, M.D., Ph.D. Diseases & Surgery of the Retina and Vitreous [facility]    Abbreviations: M myopia (nearsighted); A astigmatism; H hyperopia (farsighted); P presbyopia; Mrx spectacle prescription;  CTL contact lenses; OD right eye; OS left eye; OU both eyes  XT exotropia; ET esotropia; PEK punctate epithelial keratitis; PEE punctate epithelial erosions; DES dry eye syndrome; MGD meibomian gland dysfunction; ATs artificial tears; PFAT's preservative free artificial tears; NSC nuclear sclerotic cataract; PSC posterior subcapsular cataract; ERM epi-retinal membrane; PVD posterior vitreous detachment; RD retinal detachment; DM diabetes mellitus; DR diabetic retinopathy; NPDR non-proliferative diabetic retinopathy; PDR proliferative diabetic retinopathy; CSME clinically significant macular edema; DME diabetic macular edema; dbh dot blot hemorrhages; CWS cotton wool spot; POAG primary open angle glaucoma; C/D cup-to-disc ratio; HVF humphrey visual field; GVF goldmann visual field; OCT optical coherence tomography; IOP intraocular pressure; BRVO Branch retinal vein occlusion; CRVO central retinal vein occlusion; CRAO central retinal artery occlusion; BRAO branch retinal artery occlusion; RT retinal tear; SB scleral buckle; PPV pars plana vitrectomy; VH Vitreous hemorrhage; PRP panretinal laser photocoagulation; IVK intravitreal kenalog; VMT vitreomacular traction; MH Macular hole;  NVD neovascularization of the disc; NVE neovascularization elsewhere; AREDS age related eye  disease study; ARMD age related macular degeneration; POAG primary open angle glaucoma; EBMD epithelial/anterior basement membrane dystrophy; ACIOL anterior chamber intraocular lens; IOL intraocular lens; PCIOL posterior chamber intraocular lens; Phaco/IOL phacoemulsification with intraocular lens placement; PRK photorefractive keratectomy; LASIK laser assisted in situ keratomileusis; HTN hypertension; DM diabetes mellitus; COPD chronic obstructive pulmonary disease

## 2024-03-19 ENCOUNTER — Ambulatory Visit (INDEPENDENT_AMBULATORY_CARE_PROVIDER_SITE_OTHER): Admitting: Ophthalmology

## 2024-03-19 ENCOUNTER — Encounter (INDEPENDENT_AMBULATORY_CARE_PROVIDER_SITE_OTHER): Payer: Self-pay | Admitting: Ophthalmology

## 2024-03-19 ENCOUNTER — Encounter (HOSPITAL_BASED_OUTPATIENT_CLINIC_OR_DEPARTMENT_OTHER): Admitting: Internal Medicine

## 2024-03-19 DIAGNOSIS — Z7985 Long-term (current) use of injectable non-insulin antidiabetic drugs: Secondary | ICD-10-CM

## 2024-03-19 DIAGNOSIS — H35033 Hypertensive retinopathy, bilateral: Secondary | ICD-10-CM

## 2024-03-19 DIAGNOSIS — H4313 Vitreous hemorrhage, bilateral: Secondary | ICD-10-CM | POA: Diagnosis not present

## 2024-03-19 DIAGNOSIS — H25813 Combined forms of age-related cataract, bilateral: Secondary | ICD-10-CM

## 2024-03-19 DIAGNOSIS — H3582 Retinal ischemia: Secondary | ICD-10-CM

## 2024-03-19 DIAGNOSIS — E113511 Type 2 diabetes mellitus with proliferative diabetic retinopathy with macular edema, right eye: Secondary | ICD-10-CM | POA: Diagnosis not present

## 2024-03-19 DIAGNOSIS — E113412 Type 2 diabetes mellitus with severe nonproliferative diabetic retinopathy with macular edema, left eye: Secondary | ICD-10-CM

## 2024-03-19 DIAGNOSIS — I10 Essential (primary) hypertension: Secondary | ICD-10-CM

## 2024-03-19 MED ORDER — BEVACIZUMAB CHEMO INJECTION 1.25MG/0.05ML SYRINGE FOR KALEIDOSCOPE
1.2500 mg | INTRAVITREAL | Status: AC | PRN
  Administered 2024-03-19: 1.25 mg via INTRAVITREAL

## 2024-04-01 ENCOUNTER — Inpatient Hospital Stay
Admission: RE | Admit: 2024-04-01 | Discharge: 2024-04-03 | Disposition: A | Discharge status: Home / Self Care | Payer: 59 | Source: Other Acute Inpatient Hospital | Attending: Internal Medicine | Admitting: Internal Medicine

## 2024-04-01 DIAGNOSIS — I214 Non-ST elevation (NSTEMI) myocardial infarction: Secondary | ICD-10-CM

## 2024-04-01 DIAGNOSIS — R079 Chest pain, unspecified: Principal | ICD-10-CM

## 2024-04-01 LAB — CBC
Hematocrit: 41 % — ABNORMAL LOW (ref 41.1–50.3)
Hemoglobin: 14.1 g/dL (ref 13.6–17.2)
MCH: 31.1 pg (ref 26.1–32.9)
MCHC: 34.4 g/dL (ref 31.4–35.0)
MCV: 90.5 FL (ref 82–102)
MPV: 8.7 FL — ABNORMAL LOW (ref 9.4–12.3)
Platelets: 196 K/uL (ref 150–450)
RBC: 4.53 M/uL (ref 4.23–5.6)
RDW: 12.7 % (ref 11.9–14.6)
WBC: 7.1 K/uL (ref 4.3–11.1)
nRBC: 0 K/uL (ref 0.0–0.2)

## 2024-04-01 LAB — EKG 12-LEAD
Atrial Rate: 83 {beats}/min
Diagnosis: NORMAL
P Axis: 55 degrees
P-R Interval: 180 ms
Q-T Interval: 364 ms
QRS Duration: 90 ms
QTc Calculation (Bazett): 427 ms
R Axis: 56 degrees
T Axis: -20 degrees
Ventricular Rate: 83 {beats}/min

## 2024-04-01 LAB — POCT GLUCOSE: POC Glucose: 101 mg/dL — ABNORMAL HIGH (ref 65–100)

## 2024-04-01 LAB — APTT: APTT: 51.3 s — ABNORMAL HIGH (ref 23.3–37.4)

## 2024-04-01 LAB — PROTIME-INR
INR: 1
Protime: 13.4 s (ref 11.3–14.9)

## 2024-04-01 LAB — ANTI-XA, HEPARIN: Anti-XA Unfrac Heparin: 0.27 [IU]/mL — ABNORMAL LOW (ref 0.3–0.7)

## 2024-04-01 MED ORDER — HEPARIN SODIUM (PORCINE) 1000 UNIT/ML IJ SOLN
1000 | Freq: Once | INTRAMUSCULAR | Status: DC
Start: 2024-04-01 — End: 2024-04-01

## 2024-04-01 MED ORDER — SODIUM CHLORIDE 0.9 % IV SOLN
0.9 | INTRAVENOUS | Status: DC | PRN
Start: 2024-04-01 — End: 2024-04-03

## 2024-04-01 MED ORDER — DEXTROSE 10 % IV SOLN
10 | INTRAVENOUS | Status: DC | PRN
Start: 2024-04-01 — End: 2024-04-03

## 2024-04-01 MED ORDER — NITROGLYCERIN 2 % TD OINT
2 | Freq: Four times a day (QID) | TRANSDERMAL | Status: DC
Start: 2024-04-01 — End: 2024-04-03
  Administered 2024-04-01 – 2024-04-03 (×5): 0.5 [in_us] via TOPICAL

## 2024-04-01 MED ORDER — HYDROCHLOROTHIAZIDE 50 MG PO TABS
50 | Freq: Two times a day (BID) | ORAL | Status: DC
Start: 2024-04-01 — End: 2024-04-03

## 2024-04-01 MED ORDER — HEPARIN SODIUM (PORCINE) 1000 UNIT/ML IJ SOLN
1000 | INTRAMUSCULAR | Status: DC | PRN
Start: 2024-04-01 — End: 2024-04-03

## 2024-04-01 MED ORDER — ACETAMINOPHEN 650 MG RE SUPP
650 | Freq: Four times a day (QID) | RECTAL | Status: DC | PRN
Start: 2024-04-01 — End: 2024-04-03

## 2024-04-01 MED ORDER — GLUCOSE 4 G PO CHEW
4 | ORAL | Status: DC | PRN
Start: 2024-04-01 — End: 2024-04-03

## 2024-04-01 MED ORDER — ASPIRIN 81 MG PO TBEC
81 | Freq: Every day | ORAL | Status: DC
Start: 2024-04-01 — End: 2024-04-03
  Administered 2024-04-02 – 2024-04-03 (×2): 81 mg via ORAL

## 2024-04-01 MED ORDER — NORMAL SALINE FLUSH 0.9 % IV SOLN
0.9 | Freq: Two times a day (BID) | INTRAVENOUS | Status: DC
Start: 2024-04-01 — End: 2024-04-03
  Administered 2024-04-03 (×2): 10 mL via INTRAVENOUS

## 2024-04-01 MED ORDER — HEPARIN SODIUM (PORCINE) 1000 UNIT/ML IJ SOLN
1000 | INTRAMUSCULAR | Status: DC | PRN
Start: 2024-04-01 — End: 2024-04-03
  Administered 2024-04-01: 23:00:00 2000 [IU] via INTRAVENOUS

## 2024-04-01 MED ORDER — GLUCAGON EMERGENCY 1 MG IJ KIT
1 | INTRAMUSCULAR | Status: DC | PRN
Start: 2024-04-01 — End: 2024-04-03

## 2024-04-01 MED ORDER — DEXTROSE 10 % IV BOLUS
INTRAVENOUS | Status: DC | PRN
Start: 2024-04-01 — End: 2024-04-03

## 2024-04-01 MED ORDER — HEPARIN SOD (PORCINE) IN D5W 100 UNIT/ML IV SOLN
100 | INTRAVENOUS | Status: DC
Start: 2024-04-01 — End: 2024-04-03
  Administered 2024-04-01: 21:00:00 11.481 [IU]/kg/h via INTRAVENOUS
  Administered 2024-04-02: 10:00:00 13.481 [IU]/kg/h via INTRAVENOUS

## 2024-04-01 MED ORDER — METOPROLOL TARTRATE 25 MG PO TABS
25 | Freq: Two times a day (BID) | ORAL | Status: DC
Start: 2024-04-01 — End: 2024-04-03
  Administered 2024-04-02 – 2024-04-03 (×4): 25 mg via ORAL

## 2024-04-01 MED ORDER — NORMAL SALINE FLUSH 0.9 % IV SOLN
0.9 | INTRAVENOUS | Status: DC | PRN
Start: 2024-04-01 — End: 2024-04-03

## 2024-04-01 MED ORDER — ONDANSETRON HCL 4 MG/2ML IJ SOLN
4 | Freq: Four times a day (QID) | INTRAMUSCULAR | Status: DC | PRN
Start: 2024-04-01 — End: 2024-04-03

## 2024-04-01 MED ORDER — PANTOPRAZOLE SODIUM 40 MG PO TBEC
40 | Freq: Every day | ORAL | Status: DC
Start: 2024-04-01 — End: 2024-04-03
  Administered 2024-04-02 – 2024-04-03 (×2): 40 mg via ORAL

## 2024-04-01 MED ORDER — POLYETHYLENE GLYCOL 3350 17 G PO PACK
17 | Freq: Every day | ORAL | Status: DC | PRN
Start: 2024-04-01 — End: 2024-04-03

## 2024-04-01 MED ORDER — INSULIN LISPRO 100 UNIT/ML IJ SOLN
100 | Freq: Four times a day (QID) | INTRAMUSCULAR | Status: DC
Start: 2024-04-01 — End: 2024-04-03

## 2024-04-01 MED ORDER — ACETAMINOPHEN 325 MG PO TABS
325 | Freq: Four times a day (QID) | ORAL | Status: DC | PRN
Start: 2024-04-01 — End: 2024-04-02

## 2024-04-01 MED ORDER — ATORVASTATIN CALCIUM 40 MG PO TABS
40 | Freq: Every evening | ORAL | Status: DC
Start: 2024-04-01 — End: 2024-04-03
  Administered 2024-04-03: 40 mg via ORAL

## 2024-04-01 MED ORDER — ONDANSETRON 4 MG PO TBDP
4 | Freq: Three times a day (TID) | ORAL | Status: DC | PRN
Start: 2024-04-01 — End: 2024-04-03

## 2024-04-01 MED FILL — HEPARIN SODIUM (PORCINE) 1000 UNIT/ML IJ SOLN: 1000 [IU]/mL | INTRAMUSCULAR | Qty: 2 | Fill #0

## 2024-04-01 MED FILL — NITRO-BID 2 % TD OINT: 2 % | TRANSDERMAL | Qty: 1 | Fill #0

## 2024-04-01 NOTE — Plan of Care (Signed)
 4 Eyes Skin Assessment     NAME:  [name] [name]  DATE OF BIRTH:  06-24-1966  MEDICAL RECORD NUMBER:  [id number]    The patient is being assessed for  Admission    I agree that at least one RN has performed a thorough Head to Toe Skin Assessment on the patient. ALL assessment sites listed below have been assessed.      Areas assessed by both nurses:    Head, Face, Ears, Shoulders, Back, Chest, Arms, Elbows, Hands, Sacrum. Buttock, Coccyx, Ischium, Legs. Feet and Heels, and Under Medical Devices         Does the Patient have a Wound? Yes wound(s) were present on assessment. LDA wound assessment was Initiated and completed by RN       Braden Prevention initiated by RN: Yes  Wound Care Orders initiated by RN: Yes    For hospital-acquired stage 1 & 2 and ALL Stage 3,4, Unstageable, DTI, NWPT, and Complex wounds: place order "IP Wound Care/Ostomy Nurse Eval and Treat" by RN under ORDER ENTRY: No    New Ostomies, if present place, Ostomy referral order under ORDER ENTRY: No     Nurse 1 eSignature: Electronically signed by CLEOTILDE NORLEEN DASEN, RN on 04/01/24 at 6:18 PM EDT    **SHARE this note so that the co-signing nurse can place an eSignature**    Nurse 2 eSignature: Electronically signed by Earna Rummer, RN on 04/01/24 at 6:51 PM EDT.

## 2024-04-01 NOTE — Progress Notes (Signed)
 TRANSFER - IN REPORT:    Verbal report received from [facility] ED RN on Norleen GORMAN Jeans  being received from [facility] ED for routine progression of patient care      Report consisted of patient's Situation, Background, Assessment and   Recommendations(SBAR).     Information from the following report(s) Nurse Handoff Report, ED Encounter Summary, ED SBAR, Adult Overview, MAR, Recent Results, and Neuro Assessment was reviewed with the receiving nurse.    Opportunity for questions and clarification was provided.      Assessment completed upon patient's arrival to unit and care assumed.

## 2024-04-01 NOTE — Plan of Care (Signed)
 Problem: Chronic Conditions and Co-morbidities  Goal: Patient's chronic conditions and co-morbidity symptoms are monitored and maintained or improved  04/02/2024 0634 by Reyes Muscat, RN  Outcome: Progressing  04/01/2024 1852 by Henry Foot, RN  Outcome: Progressing     Problem: Discharge Planning  Goal: Discharge to home or other facility with appropriate resources  04/02/2024 0634 by Reyes Muscat, RN  Outcome: Progressing  04/01/2024 1852 by Henry Foot, RN  Outcome: Progressing     Problem: Safety - Adult  Goal: Free from fall injury  04/02/2024 0634 by Reyes Muscat, RN  Outcome: Progressing  04/01/2024 1852 by Henry Foot, RN  Outcome: Progressing     Problem: ABCDS Injury Assessment  Goal: Absence of physical injury  04/02/2024 0634 by Reyes Muscat, RN  Outcome: Progressing  04/01/2024 1852 by Henry Foot, RN  Outcome: Progressing     Problem: Pain  Goal: Verbalizes/displays adequate comfort level or baseline comfort level  Outcome: Progressing

## 2024-04-01 NOTE — Consults (Signed)
 See consult note 04/01/2024

## 2024-04-01 NOTE — H&P (Signed)
 Hospitalist History and Physical   Admit Date:  04/01/2024  3:54 PM   Name:  [name] [name]   Age:  58 y.o.  Sex:  male  DOB:  28-Aug-1965   MRN:  [id number]   Room:  406/01    Presenting/Chief Complaint: No chief complaint on file.     Reason(s) for Admission: NSTEMI (non-ST elevated myocardial infarction) (HCC) [I21.4]  Acute coronary syndrome (HCC) [I24.9]     History of Present Illness:     [name] [name] is a 59 y.o. male with medical history of DM2, HTN, cigar smoking, HLP evaluated as a transfer from [facility] for chest pain. He is visiting his daughter from [place] who is expecting his grandchild. He had recently chest tightness that moved to LUE, not to face, no associated dyspnea or sweats. Denies prior CAD. Has recently improved his A1C. Not taking insulin but takes ozempic. He has had some prior syncope but notes normal BG during this event.     Troponin 200-300  EKG reported as nonspecific ST segment changes , repeated on arrival as NSR with T wave inversions inferior leads   CXR negative     He has received IV heparin, asa,  and nitro paste and feels improved on arrival to floor       FULL CODE  Wife Jearline [phone]       Assessment & Plan:       NSTEMI  Chest pain  -admit to tele   -NPO midnight  -IV heparin   -lipitor  -asa  -nitropaste   -cardiology consult, I discussed in person with Dr. KANDICE        DM2:  -SSI        HTN:  -holding home meds     PT/OT evals ordered?  Not ordered; patient not expected to need rehab  Diet: ADULT DIET; Regular; 3 carb choices (45 gm/meal); Low Fat/Low Chol/High Fiber/NAS  Diet NPO  VTE prophylaxis: Heparin  Code status: Full Code  Code status discussed: Yes  Blood consent obtained: No      Non-peripheral Lines and Tubes (if present):             Hospital Problems:  Principal Problem:    Acute coronary syndrome (HCC)  Active Problems:    Chest pain    Hypertension    Type 2 diabetes mellitus (HCC)  Resolved Problems:    * No resolved hospital problems.  *        Objective:   Patient Vitals for the past 24 hrs:   Temp Pulse Resp BP SpO2   04/01/24 1825 -- 98 -- (!) 146/87 --   04/01/24 1655 -- 91 -- 127/79 --   04/01/24 1604 99.1 F (37.3 C) 84 20 127/79 97 %       Oxygen Therapy  SpO2: 97 %  O2 Device: None (Room air)    Estimated body mass index is 28.35 kg/m as calculated from the following:    Height as of this encounter: 1.753 m (5' 9).    Weight as of this encounter: 87.1 kg (192 lb).  No intake or output data in the 24 hours ending 04/01/24 1834      Physical Exam:    General:    Well nourished.  Alert, no distress, very pleasant   Head:  Normocephalic, atraumatic  Eyes:  Sclerae appear normal.  Pupils equally round.  ENT:  Nares appear normal.  Moist oral mucosa  Neck:  No  restricted ROM.  Trachea midline   CV:   RRR.  No m/r/g.  No jugular venous distension. No edema   Lungs:   CTAB.  No wheezing, rhonchi, or rales.  Symmetric expansion.  Abdomen:   Soft, nontender, nondistended.  Extremities: No cyanosis or clubbing.  No edema  Skin:     No rashes.  Normal coloration.   Warm and dry.    Neuro:  grossly intact.  Psych:  Mood and affect appropriate      Orders Placed This Encounter   Medications    sodium chloride  flush 0.9 % injection 5-40 mL    sodium chloride  flush 0.9 % injection 5-40 mL    0.9 % sodium chloride  infusion    OR Linked Order Group     ondansetron (ZOFRAN-ODT) disintegrating tablet 4 mg     ondansetron (ZOFRAN) injection 4 mg    polyethylene glycol (GLYCOLAX) packet 17 g    OR Linked Order Group     acetaminophen (TYLENOL) tablet 650 mg     acetaminophen (TYLENOL) suppository 650 mg    DISCONTD: heparin (porcine) injection 4,000 Units    heparin (porcine) injection 4,000 Units    heparin (porcine) injection 2,000 Units    heparin 25,000 units in dextrose 5% 250 mL (premix) infusion    glucose chewable tablet 16 g    OR Linked Order Group     dextrose bolus 10% 125 mL     dextrose bolus 10% 250 mL    Glucagon Emergency KIT 1 mg     dextrose 10 % infusion    insulin lispro (HUMALOG,ADMELOG) injection vial 0-4 Units    nitroglycerin (NITRO-BID) 2 % ointment 0.5 inch    hydroCHLOROthiazide (HYDRODIURIL) tablet 25 mg    pantoprazole (PROTONIX) tablet 40 mg    metoprolol tartrate (LOPRESSOR) tablet 25 mg    aspirin EC tablet 81 mg    atorvastatin (LIPITOR) tablet 40 mg       Prior to Admit Medications:  Current Outpatient Medications   Medication Instructions    amLODIPine (NORVASC) 10 mg    gabapentin (NEURONTIN) 800 mg    glipiZIDE (GLUCOTROL) 10 mg, 2 TIMES DAILY BEFORE MEALS    hydroCHLOROthiazide (HYDRODIURIL) 25 mg    insulin aspart (NOVOLOG) 100 UNIT/ML injection vial Inject into the skin    naproxen  (NAPROSYN ) 500 mg, Oral, 2 TIMES DAILY    omeprazole (PRILOSEC) 40 mg    oxyCODONE-acetaminophen (PERCOCET) 10-325 MG per tablet Take by mouth.       I have personally reviewed labs and tests:  Recent Results (from the past 24 hours)   EKG 12 Lead    Collection Time: 04/01/24  4:24 PM   Result Value Ref Range    Ventricular Rate 83 BPM    Atrial Rate 83 BPM    P-R Interval 180 ms    QRS Duration 90 ms    Q-T Interval 364 ms    QTc Calculation (Bazett) 427 ms    P Axis 55 degrees    R Axis 56 degrees    T Axis -20 degrees    Diagnosis       Normal sinus rhythm  T wave abnormality, consider inferior ischemia  Abnormal ECG  When compared with ECG of 05-Aug-2023 22:20,  MANUAL COMPARISON REQUIRED PREVIOUS ECG IS INCOMPATIBLE  Confirmed by GRABARCZYK  MD (UC), MARK A ([id number]) on 04/01/2024 5:41:25 PM     POCT Glucose    Collection Time: 04/01/24  4:30 PM  Result Value Ref Range    POC Glucose 101 (H) 65 - 100 mg/dL    Performed by: BennettVenusPCT    CBC    Collection Time: 04/01/24  4:53 PM   Result Value Ref Range    WBC 7.1 4.3 - 11.1 K/uL    RBC 4.53 4.23 - 5.6 M/uL    Hemoglobin 14.1 13.6 - 17.2 g/dL    Hematocrit 58.9 (L) 41.1 - 50.3 %    MCV 90.5 82 - 102 FL    MCH 31.1 26.1 - 32.9 PG    MCHC 34.4 31.4 - 35.0 g/dL    RDW 87.2 88.0 - 85.3 %     Platelets 196 150 - 450 K/uL    MPV 8.7 (L) 9.4 - 12.3 FL    nRBC 0.00 0.0 - 0.2 K/uL   Anti-XA, Heparin    Collection Time: 04/01/24  4:53 PM   Result Value Ref Range    Anti-XA Unfrac Heparin 0.27 (L) 0.3 - 0.7 IU/mL   Protime-INR    Collection Time: 04/01/24  4:53 PM   Result Value Ref Range    Protime 13.4 11.3 - 14.9 sec    INR 1.0     APTT    Collection Time: 04/01/24  4:53 PM   Result Value Ref Range    APTT 51.3 (H) 23.3 - 37.4 SEC       No results for input(s): COVID19 in the last 72 hours.    XR CHEST (2 VW)  Result Date: 04/01/2024    EXAM:  CHEST X-RAY: 2 VIEWS HISTORY:  Chest pain, ACS suspected;Indication; COMPARISON: None TECHNIQUE: Chest PA and Lateral FINDINGS: Frontal and lateral views Mildly underexposed technique and mild decreased volume of inspiration accentuates bronchovascular markings. Mild pulmonary vascular congestion is considered. Heart size appears borderline also accentuated by mild decreased volume of inspiration. No acute alveolar infiltrate, pneumothorax, or pleural effusion. Osseous structures unremarkable. Mediastinum unremarkable. Signed by: 04/01/2024 8:44 AM: Mamrick MD, Richard        Signed:  Dorian Hals, MD    Part of this note may have been written by using a voice dictation software.  The note has been proof read but may still contain some grammatical/other typographical errors.

## 2024-04-01 NOTE — Consults (Signed)
 In this split/shared evaluation I performed reviewed the patients's H&P, available images, labs, cultures., discussed case in detail with ACP, performed a medically appropriate history and exam, counseled and educated the patient and/or family member, ordered and/or reviewed medications, tests or procedures, documented information in EMR, independently interpreted images and coordinated care.     Personal Time: 45 minutes -this equates to greater than 50% of total time in patient consultation/care.      Patient seen and examined by me.  Agree with above note by physician extender.  Key findings are: 58 year old gentleman with history of hypertension and diabetes PTSD alcohol use and cigar use who has a strong family history of premature coronary disease in father with myocardial infarction at age 22.  He presents with symptoms of accelerating chest pain and abnormal cardiac biomarkers suggestive of non-ST elevation myocardial infarction.  EKG demonstrates acute inferior ST depression and T wave inversions indicative of inferior ischemia.    Vitals:    04/01/24 1604   BP: 127/79   Pulse: 84   Resp: 20   Temp: 99.1 F (37.3 C)   SpO2: 97%   Weight: 87.1 kg (192 lb)        General: Patient is alert and oriented, no apparent distress  HEENT: Pupils equal reactive, oropharynx clear  Chest: Clear to auscultation bilaterally  Cardiovascular: S1-S2 regular with no murmur  Abdomen: Soft positive bowel sounds  Extremities: Soft no edema with intact distal pulses    Principal Problem:    Acute coronary syndrome [facility])  Plan: Patient admitted with non-ST elevation myocardial infarction.  He is currently chest pain-free.  Start IV heparin.  Patient will placed on nitroglycerin paste.  Will manage blood pressure with ACE inhibitor and beta-blocker.  Will start high intensity statin therapy and continue aspirin 81 mg daily.  Will plan to proceed with cardiac catheterization tomorrow. The benefits and risks of left heart  catheterization and possible percutaneous intervention were discussed with the patient.  Risks including but not limited to bleeding, infection, contrast allergy reaction, acute kidney injury, MI, stroke, emergent CABG and death were discussed.  The patient understands the risks of the procedure and wishes to proceed.     Diabetes  Plan: Check hemoglobin A1c.  Patient is admitted by the hospitalist.  Will defer diabetic management to the hospitalist.    Dyslipidemia  Plan: No recent lipid assessment.  Check fasting lipid profile.  Start high intensity statin therapy.          [name] DELENA DOLL, MD          [facility] Initial Cardiac Evaluation      Date of  Admission: 04/01/2024  3:54 PM     Primary Care Physician: [name] Everitt SAUNDERS, MD  Primary Cardiologist: [name] [name]  Referring Physician: Jerrye Aspen, MD   Supervising Physician: Dr. DOLL    CC/Reason for evaluation: NSTEMI    HPI:  [name] [name] is a 58 y.o. male with prior history of HFpEF?, DM2, HTN, PTSD, ETOH use, cigar abuse, MI in his father at 60 y.o. who presents with substernal CP to an outside facility.  Says he has right midsternal chest discomfort that started about a week ago however this morning progressed to left-sided chest discomfort with radiation down his left arm.  He says the radiation lasted about 1 to 2 hours.  He went to [facility] emergency department for further evaluation.  Reportedly ECG showed sinus rhythm with inferior T wave abnormalities.  Stat ECG  pending.  NT proBNP 689.  Troponin T 287 up to 304.  BMP unremarkable.  CBC WNL.    He is currently chest pain-free.      Cardiology consulted for NSTEMI.      Echo 10/2022  IMPRESSIONS    1. Left ventricular ejection fraction, by estimation, is 60 to 65%. The left ventricle has normal function. The left ventricle has no regional wall motion abnormalities. There is mild left ventricular hypertrophy. Left ventricular diastolic parameters   are consistent with Grade I  diastolic dysfunction (impaired relaxation). The average left ventricular global longitudinal strain is -21.2 %.    2. Right ventricular systolic function is normal. The right ventricular size is normal. Tricuspid regurgitation signal is inadequate for assessing PA pressure.    3. The mitral valve is normal in structure. No evidence of mitral valve regurgitation. No evidence of mitral stenosis.    4. The aortic valve is tricuspid. Aortic valve regurgitation is moderate. Aortic valve sclerosis is present, with no evidence of aortic valve stenosis.    5. The inferior vena cava is normal in size with greater than 50% respiratory variability, suggesting right atrial pressure of 3 mmHg.       Past Medical History:   Diagnosis Date    Diabetes mellitus (HCC)     Hypertension       No past surgical history on file.    Allergies   Allergen Reactions    Lisinopril Hives      Social History     Socioeconomic History    Marital status: Single     Spouse name: Not on file    Number of children: Not on file    Years of education: Not on file    Highest education level: Not on file   Occupational History    Not on file   Tobacco Use    Smoking status: Not on file    Smokeless tobacco: Not on file   Substance and Sexual Activity    Alcohol use: Not on file    Drug use: Not on file    Sexual activity: Not on file   Other Topics Concern    Not on file   Social History Narrative    Not on file     Social Drivers of [name]     Financial Resource Strain: Not At Risk (04/01/2024)    Received from [facility] - Financial Strain     How hard is it for you to pay for the very basics like food, housing, medical care, and heating? Would you say it is:: Not hard at all   Food Insecurity: No Food Insecurity (04/01/2024)    Received from [facility]    Hunger Vital Sign     Within the past 12 months, you worried that your food would run out before you got the money to buy more.: Never true     Within the past 12 months, the food you  bought just didn't last and you didn't have money to get more.: Never true   Transportation Needs: Not At Risk (04/01/2024)    Received from [facility] - Transportation     In the past 12 months, has lack of reliable transportation kept you from medical appointments, meetings, work or from getting things needed for daily living?: No   Physical Activity: Inactive (04/01/2024)    Received from [facility]    Physical Activity  Days of Exercise per Week: 0 days     On those days that you engage in moderate to vigorous physical activity, how many minutes, on average, do you exercise?: 0     Total Minutes of Exercise Per Week: 0   Stress: Not on file   Social Connections: Not At Risk (04/01/2024)    Received from [facility] - Social Connections     If for any reason you need help with day-to-day activities such as bathing, preparing meals, shopping, managing finances, etc., do you get the help you need?: I get all the help I need     How often do you feel lonely or isolated from those around you?: Never   Intimate Partner Violence: Unknown (10/05/2021)    Received from [name]    HITS     Physically Hurt: Not on file     Insult or Talk Down To: Not on file     Threaten Physical Harm: Not on file     Scream or Curse: Not on file   Housing Stability: Not At Risk (04/01/2024)    Received from [facility] - Inadequate Housing     What is your living situation today?: I have a steady place to live     Think about the place you live. Do you have problems with any of the following?: None of the above     Social History       Tobacco History       Smoking Status  Never Assessed      Smokeless Tobacco Use  Unknown              Alcohol History       Alcohol Use Status  Not Asked              Drug Use       Drug Use Status  Not Asked              Sexual Activity       Sexually Active  Not Asked                    No family history on file.     No current facility-administered medications for this  encounter.     Current Outpatient Medications   Medication Sig    naproxen  (NAPROSYN ) 500 MG tablet Take 1 tablet by mouth 2 times daily    omeprazole (PRILOSEC) 40 MG delayed release capsule Take 40 mg by mouth    oxyCODONE-acetaminophen (PERCOCET) 10-325 MG per tablet Take by mouth.    gabapentin (NEURONTIN) 400 MG capsule Take 800 mg by mouth.    amLODIPine (NORVASC) 10 MG tablet Take 10 mg by mouth    insulin aspart (NOVOLOG) 100 UNIT/ML injection vial Inject into the skin    hydroCHLOROthiazide (HYDRODIURIL) 25 MG tablet Take 25 mg by mouth    glipiZIDE (GLUCOTROL) 10 MG tablet Take 10 mg by mouth 2 times daily (before meals)       Review of Symptoms:  Review of Systems   All other systems reviewed and are negative.             Subjective:     Physical Exam:    There were no vitals filed for this visit.    Physical Exam  Vitals reviewed.   Constitutional:       General: He is not in acute distress.  Appearance: He is not toxic-appearing.   Eyes:      Pupils: Pupils are equal, round, and reactive to light.   Cardiovascular:      Rate and Rhythm: Normal rate and regular rhythm.      Pulses: Normal pulses.      Heart sounds: Normal heart sounds.   Pulmonary:      Effort: Pulmonary effort is normal.      Breath sounds: Normal breath sounds.   Abdominal:      General: Bowel sounds are normal.      Palpations: Abdomen is soft.   Skin:     General: Skin is warm.      Capillary Refill: Capillary refill takes less than 2 seconds.   Neurological:      General: No focal deficit present.      Mental Status: He is alert.   Psychiatric:         Mood and Affect: Mood normal.           Cardiographics    Telemetry:   Echocardiogram: No results found for this or any previous visit.    Cath: No results found for this or any previous visit.    EP: No results found for this or any previous visit.      Labs:     No results for input(s): WBC, HGB, HCT, MCV, PLT in the last 720 hours.  Lab Results   Component Value Date     WBC 7.3 08/05/2023    HGB 14.8 08/05/2023    HCT 40.5 (L) 08/05/2023    PLT 209 08/05/2023    ALT 4 (L) 08/05/2023    AST 16 08/05/2023    NA 129 (L) 08/05/2023    K 4.8 08/05/2023    CL 95 (L) 08/05/2023    CREATININE 1.01 08/05/2023    BUN 19 08/05/2023    CO2 22 08/05/2023        Pt has been seen and examined by Dr. Sela. He agrees with the following assessment and plan.     Assessment/Plan:     NSTEMI  -Accelerating anginal symptoms, Elevated biomarkers and new inferior T wave inversions.  - Currently CP free.  -Start Nitropaste, aspirin, statin and beta-blocker.  -Check echo.  - Discussed LHC with the patient and reviewed the risks.  He is agreeable to proceed.  Keep the patient n.p.o. after midnight tonight.  Plan for LHC in the morning.    ------------------- The HEART Risk Score for Acute Coronary Syndrome --------------------     History: slight, moderate, highly suspicious   +1   EKG: Normal, nonspecific repolarization changes, ST depression  +1   Age <46 years, 42-64, 65+  +1   Risk Factors for CAD: 0, 1-2, >2  +1   Troponin; low, 1-3x normal  Limit, 3x+  +2   Total HEART Score:  6     *Risk factors as follows:                  - History/Family history of CAD    - Hypertension      - Hyperlipidemia     - Diabetes mellitus  - Current smoker  - Obesity    Predicts 6-week risk of major adverse cardiac event.  Low Score (0-3 points), risk of MACE of 0.9-1.7%.  Moderate Score (4-6 points), risk of MACE of 12-16.6%.  High Score (7-10 points), risk of MACE of 50-65%.      HTN  - Continue  amlodipine as necessary.    DM 2  - Hospital medicine team following.    History of smoking  - Counseled on cessation.        Thank you for requesting cardiac evaluation and allowing us  to participate in the care of this patient. We will continue to follow along with you.      Evalene Con Simpers, APRN - NP-C  Supervising Physician: Dr. Sela

## 2024-04-01 NOTE — Plan of Care (Signed)
 Problem: Chronic Conditions and Co-morbidities  Goal: Patient's chronic conditions and co-morbidity symptoms are monitored and maintained or improved  Outcome: Progressing     Problem: Discharge Planning  Goal: Discharge to home or other facility with appropriate resources  Outcome: Progressing     Problem: Safety - Adult  Goal: Free from fall injury  Outcome: Progressing     Problem: ABCDS Injury Assessment  Goal: Absence of physical injury  Outcome: Progressing

## 2024-04-02 ENCOUNTER — Inpatient Hospital Stay: Admit: 2024-04-02 | Discharge: 2024-04-14 | Payer: 59

## 2024-04-02 ENCOUNTER — Inpatient Hospital Stay: Payer: 59

## 2024-04-02 LAB — ECHO (TTE) COMPLETE (PRN CONTRAST/BUBBLE/STRAIN/3D)
AV Area by Peak Velocity: 2.2 cm2
AV Area by VTI: 2 cm2
AV Mean Gradient: 5 mmHg
AV Mean Velocity: 1 m/s
AV Peak Gradient: 8 mmHg
AV Peak Gradient: 8 mmHg
AV Peak Velocity: 1.4 m/s
AV VTI: 30 cm
AV Velocity Ratio: 0.64
AVA/BSA Peak Velocity: 1.1 cm2/m2
AVA/BSA VTI: 1 cm2/m2
Ao Root Index: 1.57 cm/m2
Aortic Root: 3.2 cm
Ascending Aorta Index: 1.62 cm/m2
Ascending Aorta: 3.3 cm
Body Surface Area: 2.06 m2
E/E' Lateral: 8.42
E/E' Ratio (Averaged): 10.42
E/E' Septal: 12.42
EF BP: 43 % — AB (ref 55–100)
EF Physician: 50 %
Est. RA Pressure: 3 mmHg
Fractional Shortening 2D: 32 % (ref 28–44)
IVC Proxmal: 1.4 cm
IVSd: 1.3 cm — AB (ref 0.6–1.0)
LA Area 2C: 13.4 cm2
LA Area 4C: 16.8 cm2
LA Diameter: 3.6 cm
LA Major Axis: 5.8 cm
LA Minor Axis: 4.5 cm
LA Size Index: 1.76 cm/m2
LA Volume Index MOD A2C: 16 mL/m2 (ref 16–34)
LA Volume Index MOD A4C: 19 mL/m2 (ref 16–34)
LA Volume MOD A2C: 33 mL (ref 18–58)
LA Volume MOD A4C: 39 mL (ref 18–58)
LA/AO Root Ratio: 1.13
LV E' Lateral Velocity: 9.14 cm/s
LV E' Septal Velocity: 6.2 cm/s
LV EDV A2C: 106 mL
LV EDV A4C: 94 mL
LV EDV Index A2C: 52 mL/m2
LV EDV Index A4C: 46 mL/m2
LV ESV A2C: 65 mL
LV ESV A4C: 53 mL
LV ESV Index A2C: 32 mL/m2
LV ESV Index A4C: 26 mL/m2
LV Ejection Fraction A2C: 39 %
LV Ejection Fraction A4C: 44 %
LV Mass 2D Index: 123.2 g/m2 — AB (ref 49–115)
LV Mass 2D: 251.4 g — AB (ref 88–224)
LV RWT Ratio: 0.6
LVIDd Index: 2.3 cm/m2
LVIDd: 4.7 cm (ref 4.2–5.9)
LVIDs Index: 1.57 cm/m2
LVIDs: 3.2 cm
LVOT Area: 3.5 cm2
LVOT Diameter: 2.1 cm
LVOT Mean Gradient: 2 mmHg
LVOT Peak Gradient: 3 mmHg
LVOT Peak Velocity: 0.9 m/s
LVOT SV: 60.2 mL
LVOT Stroke Volume Index: 29.5 mL/m2
LVOT VTI: 17.4 cm
LVOT:AV VTI Index: 0.58
LVPWd: 1.4 cm — AB (ref 0.6–1.0)
MV A Velocity: 0.73 m/s
MV E Velocity: 0.77 m/s
MV E Wave Deceleration Time: 197 ms
MV E/A: 1.05
PV AT: 112 ms
PV Max Velocity: 0.8 m/s
PV Peak Gradient: 2 mmHg
RV Free Wall Peak S': 15.2 cm/s

## 2024-04-02 LAB — CBC WITH AUTO DIFFERENTIAL
Basophils %: 0.8 % (ref 0.0–2.0)
Basophils Absolute: 0.05 K/UL (ref 0.00–0.20)
Eosinophils %: 2.7 % (ref 0.5–7.8)
Eosinophils Absolute: 0.16 K/UL (ref 0.00–0.80)
Hematocrit: 33.9 % — ABNORMAL LOW (ref 41.1–50.3)
Hemoglobin: 11.5 g/dL — ABNORMAL LOW (ref 13.6–17.2)
Immature Granulocytes %: 0.3 % (ref 0.0–5.0)
Immature Granulocytes Absolute: 0.02 K/UL (ref 0.0–0.5)
Lymphocytes %: 42.5 % (ref 13.0–44.0)
Lymphocytes Absolute: 2.56 K/UL (ref 0.50–4.60)
MCH: 31.1 pg (ref 26.1–32.9)
MCHC: 33.9 g/dL (ref 31.4–35.0)
MCV: 91.6 FL (ref 82–102)
MPV: 9.2 FL — ABNORMAL LOW (ref 9.4–12.3)
Monocytes %: 10.4 % (ref 4.0–12.0)
Monocytes Absolute: 0.63 K/UL (ref 0.10–1.30)
Neutrophils %: 43.3 % (ref 43.0–78.0)
Neutrophils Absolute: 2.61 K/UL (ref 1.70–8.20)
Platelets: 197 K/uL (ref 150–450)
RBC: 3.7 M/uL — ABNORMAL LOW (ref 4.23–5.6)
RDW: 12.7 % (ref 11.9–14.6)
WBC: 6 K/uL (ref 4.3–11.1)
nRBC: 0 K/uL (ref 0.0–0.2)

## 2024-04-02 LAB — EKG 12-LEAD
Atrial Rate: 71 {beats}/min
Atrial Rate: 71 {beats}/min
Diagnosis: NORMAL
Diagnosis: NORMAL
P Axis: 53 degrees
P Axis: 53 degrees
P-R Interval: 190 ms
P-R Interval: 190 ms
Q-T Interval: 410 ms
Q-T Interval: 410 ms
QRS Duration: 92 ms
QRS Duration: 92 ms
QTc Calculation (Bazett): 445 ms
QTc Calculation (Bazett): 445 ms
R Axis: 51 degrees
R Axis: 51 degrees
T Axis: 29 degrees
T Axis: 29 degrees
Ventricular Rate: 71 {beats}/min
Ventricular Rate: 71 {beats}/min

## 2024-04-02 LAB — CARDIAC PROCEDURE: Body Surface Area: 2.06 m2

## 2024-04-02 LAB — HEMOGLOBIN A1C
Estimated Avg Glucose: 121 mg/dL
Hemoglobin A1C: 5.9 % — ABNORMAL HIGH (ref 0–5.6)

## 2024-04-02 LAB — BASIC METABOLIC PANEL W/ REFLEX TO MG FOR LOW K
Anion Gap: 11 mmol/L (ref 7–16)
BUN: 19 mg/dL (ref 6–23)
CO2: 21 mmol/L (ref 20–29)
Calcium: 8.2 mg/dL — ABNORMAL LOW (ref 8.8–10.2)
Chloride: 101 mmol/L (ref 98–107)
Creatinine: 1.19 mg/dL (ref 0.80–1.30)
Est, Glom Filt Rate: 71 ml/min/1.73m2 (ref >60)
Glucose: 134 mg/dL — ABNORMAL HIGH (ref 70–99)
Potassium: 4.2 mmol/L (ref 3.5–5.1)
Sodium: 133 mmol/L — ABNORMAL LOW (ref 136–145)

## 2024-04-02 LAB — POCT GLUCOSE
POC Glucose: 152 mg/dL — ABNORMAL HIGH (ref 65–100)
POC Glucose: 129 mg/dL — ABNORMAL HIGH (ref 65–100)
POC Glucose: 176 mg/dL — ABNORMAL HIGH (ref 65–100)
POC Glucose: 124 mg/dL — ABNORMAL HIGH (ref 65–100)

## 2024-04-02 LAB — ANTI-XA, HEPARIN: Anti-XA Unfrac Heparin: 0.36 [IU]/mL (ref 0.3–0.7)

## 2024-04-02 LAB — TROPONIN: Troponin T: 377 ng/L (ref 0–22)

## 2024-04-02 LAB — POCT ACTIVATED CLOTTING TIME: Activated Clotting Time: 262 s — ABNORMAL HIGH (ref 70–128)

## 2024-04-02 MED ORDER — OXYCODONE-ACETAMINOPHEN 10-325 MG PO TABS
10-325 | Freq: Three times a day (TID) | ORAL | Status: DC | PRN
Start: 2024-04-02 — End: 2024-04-02
  Administered 2024-04-02 (×3): 1 via ORAL

## 2024-04-02 MED ORDER — GABAPENTIN 400 MG PO CAPS
400 | Freq: Three times a day (TID) | ORAL | Status: DC
Start: 2024-04-02 — End: 2024-04-03
  Administered 2024-04-02 – 2024-04-03 (×3): 800 mg via ORAL

## 2024-04-02 MED ORDER — TICAGRELOR 90 MG PO TABS
90 | Freq: Two times a day (BID) | ORAL | Status: DC
Start: 2024-04-02 — End: 2024-04-03
  Administered 2024-04-03 (×2): 90 mg via ORAL

## 2024-04-02 MED ORDER — ATROPINE SULFATE 0.4 MG/ML IV SOLN
0.4 | Freq: Once | INTRAVENOUS | Status: DC | PRN
Start: 2024-04-02 — End: 2024-04-03

## 2024-04-02 MED ORDER — HEPARIN SODIUM (PORCINE) 10000 UNIT/ML IJ SOLN
10000 | INTRAMUSCULAR | Status: DC | PRN
Start: 2024-04-02 — End: 2024-04-02
  Administered 2024-04-02: 13:00:00 4000 via INTRAVENOUS

## 2024-04-02 MED ORDER — MORPHINE SULFATE (PF) 4 MG/ML IJ SOLN
4 | INTRAMUSCULAR | Status: DC | PRN
Start: 2024-04-02 — End: 2024-04-03

## 2024-04-02 MED ORDER — GABAPENTIN 400 MG PO CAPS
400 | Freq: Every evening | ORAL | Status: DC
Start: 2024-04-02 — End: 2024-04-02
  Administered 2024-04-02: 800 mg via ORAL

## 2024-04-02 MED ORDER — NITROGLYCERIN IN D5W 200-5 MCG/ML-% IV SOLN
200-5 | INTRAVENOUS | Status: DC | PRN
Start: 2024-04-02 — End: 2024-04-02
  Administered 2024-04-02: 13:00:00 2.3 via INTRA_ARTERIAL

## 2024-04-02 MED ORDER — NITROGLYCERIN IN D5W 200-5 MCG/ML-% IV SOLN
200-5 | INTRAVENOUS | Status: AC
Start: 2024-04-02 — End: 2024-04-02

## 2024-04-02 MED ORDER — TICAGRELOR 90 MG PO TABS
90 | ORAL | Status: DC | PRN
Start: 2024-04-02 — End: 2024-04-02
  Administered 2024-04-02: 13:00:00 180 via ORAL

## 2024-04-02 MED ORDER — HEPARIN (PORCINE) IN NACL 2000-0.9 UNIT/L-% IV SOLN
2000-0.9 | INTRAVENOUS | Status: AC
Start: 2024-04-02 — End: 2024-04-02

## 2024-04-02 MED ORDER — MIDAZOLAM HCL 2 MG/2ML IJ SOLN
2 | INTRAMUSCULAR | Status: AC
Start: 2024-04-02 — End: 2024-04-02

## 2024-04-02 MED ORDER — AQUAPHOR EX OINT
Freq: Two times a day (BID) | CUTANEOUS | Status: DC
Start: 2024-04-02 — End: 2024-04-03
  Administered 2024-04-02 – 2024-04-03 (×2): via TOPICAL

## 2024-04-02 MED ORDER — SODIUM CHLORIDE 0.9 % IV BOLUS
0.9 | INTRAVENOUS | Status: DC | PRN
Start: 2024-04-02 — End: 2024-04-03

## 2024-04-02 MED ORDER — ACETAMINOPHEN 325 MG PO TABS
325 | ORAL | Status: DC | PRN
Start: 2024-04-02 — End: 2024-04-03

## 2024-04-02 MED ORDER — LIDOCAINE HCL 1 % IJ SOLN
1 | INTRAMUSCULAR | Status: AC
Start: 2024-04-02 — End: 2024-04-02

## 2024-04-02 MED ORDER — NORMAL SALINE FLUSH 0.9 % IV SOLN
0.9 | Freq: Two times a day (BID) | INTRAVENOUS | Status: DC
Start: 2024-04-02 — End: 2024-04-03
  Administered 2024-04-02 – 2024-04-03 (×2): 10 mL via INTRAVENOUS

## 2024-04-02 MED ORDER — VALSARTAN 40 MG PO TABS
40 | Freq: Two times a day (BID) | ORAL | Status: DC
Start: 2024-04-02 — End: 2024-04-03
  Administered 2024-04-02 – 2024-04-03 (×3): 40 mg via ORAL

## 2024-04-02 MED ORDER — FENTANYL CITRATE (PF) 100 MCG/2ML IJ SOLN
100 | INTRAMUSCULAR | Status: AC
Start: 2024-04-02 — End: 2024-04-02

## 2024-04-02 MED ORDER — MORPHINE SULFATE (PF) 2 MG/ML IJ SOLN
2 | INTRAMUSCULAR | Status: DC | PRN
Start: 2024-04-02 — End: 2024-04-03
  Administered 2024-04-02: 22:00:00 2 mg via INTRAVENOUS

## 2024-04-02 MED ORDER — TRAMADOL HCL 50 MG PO TABS
50 | Freq: Four times a day (QID) | ORAL | Status: DC | PRN
Start: 2024-04-02 — End: 2024-04-03

## 2024-04-02 MED ORDER — MIDAZOLAM HCL 5 MG/5ML IJ SOLN
5 | INTRAMUSCULAR | Status: DC | PRN
Start: 2024-04-02 — End: 2024-04-02
  Administered 2024-04-02: 13:00:00 2 via INTRAVENOUS
  Administered 2024-04-02: 13:00:00 1 via INTRAVENOUS

## 2024-04-02 MED ORDER — SODIUM CHLORIDE 0.9 % IV SOLN
0.9 | INTRAVENOUS | Status: DC | PRN
Start: 2024-04-02 — End: 2024-04-03

## 2024-04-02 MED ORDER — LIDOCAINE HCL 1 % IJ SOLN
1 | INTRAMUSCULAR | Status: DC | PRN
Start: 2024-04-02 — End: 2024-04-02
  Administered 2024-04-02: 13:00:00 2 via INTRADERMAL

## 2024-04-02 MED ORDER — VERAPAMIL HCL 2.5 MG/ML IV SOLN
2.5 | INTRAVENOUS | Status: AC
Start: 2024-04-02 — End: 2024-04-02

## 2024-04-02 MED ORDER — TICAGRELOR 90 MG PO TABS
90 | ORAL | Status: AC
Start: 2024-04-02 — End: 2024-04-02

## 2024-04-02 MED ORDER — IOPAMIDOL 76 % IV SOLN
76 | INTRAVENOUS | Status: DC | PRN
Start: 2024-04-02 — End: 2024-04-02
  Administered 2024-04-02: 13:00:00 130 via INTRA_ARTERIAL

## 2024-04-02 MED ORDER — IOPAMIDOL 76 % IV SOLN
76 | INTRAVENOUS | Status: AC
Start: 2024-04-02 — End: 2024-04-02

## 2024-04-02 MED ORDER — NORMAL SALINE FLUSH 0.9 % IV SOLN
0.9 | INTRAVENOUS | Status: DC | PRN
Start: 2024-04-02 — End: 2024-04-03

## 2024-04-02 MED ORDER — HEPARIN SODIUM (PORCINE) 10000 UNIT/ML IJ SOLN
10000 | INTRAMUSCULAR | Status: AC
Start: 2024-04-02 — End: 2024-04-02

## 2024-04-02 MED ORDER — HEPARIN (PORCINE) IN NACL 2000-0.9 UNIT/L-% IV SOLN
2000-0.9 | INTRAVENOUS | Status: DC | PRN
Start: 2024-04-02 — End: 2024-04-02
  Administered 2024-04-02: 13:00:00 3 via INTRA_ARTERIAL

## 2024-04-02 MED ORDER — FENTANYL 0.05 MG/ML SOLN (MIXTURES ONLY)
0.05 | Status: DC | PRN
Start: 2024-04-02 — End: 2024-04-02
  Administered 2024-04-02: 13:00:00 50 via INTRAVENOUS
  Administered 2024-04-02: 13:00:00 25 via INTRAVENOUS

## 2024-04-02 MED FILL — MIDAZOLAM HCL 2 MG/2ML IJ SOLN: 2 mg/mL | INTRAMUSCULAR | Qty: 2 | Fill #0

## 2024-04-02 MED FILL — NITRO-BID 2 % TD OINT: 2 % | TRANSDERMAL | Qty: 1 | Fill #0

## 2024-04-02 MED FILL — NITROGLYCERIN IN D5W 200-5 MCG/ML-% IV SOLN: 200-5 MCG/ML-% | INTRAVENOUS | Qty: 8 | Fill #0

## 2024-04-02 MED FILL — OXYCODONE-ACETAMINOPHEN 10-325 MG PO TABS: 10-325 mg | ORAL | Qty: 1 | Fill #0

## 2024-04-02 MED FILL — GABAPENTIN 400 MG PO CAPS: 400 mg | ORAL | Qty: 2 | Fill #0

## 2024-04-02 MED FILL — LIDOCAINE HCL 1 % IJ SOLN: 1 % | INTRAMUSCULAR | Qty: 20 | Fill #0

## 2024-04-02 MED FILL — HEPARIN SODIUM (PORCINE) 10000 UNIT/ML IJ SOLN: 10000 [IU]/mL | INTRAMUSCULAR | Qty: 2 | Fill #0

## 2024-04-02 MED FILL — PANTOPRAZOLE SODIUM 40 MG PO TBEC: 40 mg | ORAL | Qty: 1 | Fill #0

## 2024-04-02 MED FILL — ATROPINE SULFATE 0.4 MG/ML IV SOLN: 0.4 mg/mL | INTRAVENOUS | Qty: 1.25 | Fill #0

## 2024-04-02 MED FILL — ISOVUE-370 76 % IV SOLN: 76 % | INTRAVENOUS | Qty: 500 | Fill #0

## 2024-04-02 MED FILL — MORPHINE SULFATE 2 MG/ML IJ SOLN: 2 mg/mL | INTRAMUSCULAR | Qty: 1 | Fill #0

## 2024-04-02 MED FILL — TICAGRELOR 90 MG PO TABS: 90 mg | ORAL | Qty: 2 | Fill #0

## 2024-04-02 MED FILL — METOPROLOL TARTRATE 25 MG PO TABS: 25 mg | ORAL | Qty: 1 | Fill #0

## 2024-04-02 MED FILL — HEPARIN SOD (PORCINE) IN D5W 100 UNIT/ML IV SOLN: 100 [IU]/mL | INTRAVENOUS | Qty: 250 | Fill #0

## 2024-04-02 MED FILL — ATORVASTATIN CALCIUM 40 MG PO TABS: 40 mg | ORAL | Qty: 1 | Fill #0

## 2024-04-02 MED FILL — FENTANYL CITRATE (PF) 100 MCG/2ML IJ SOLN: 100 MCG/2ML | INTRAMUSCULAR | Qty: 2 | Fill #0

## 2024-04-02 MED FILL — VERAPAMIL HCL 2.5 MG/ML IV SOLN: 2.5 mg/mL | INTRAVENOUS | Qty: 4 | Fill #0

## 2024-04-02 MED FILL — VALSARTAN 40 MG PO TABS: 40 mg | ORAL | Qty: 1 | Fill #0

## 2024-04-02 MED FILL — ASPIRIN LOW DOSE 81 MG PO TBEC: 81 mg | ORAL | Qty: 1 | Fill #0

## 2024-04-02 MED FILL — HEPARIN (PORCINE) IN NACL 2000-0.9 UNIT/L-% IV SOLN: 2000-0.9 UNIT/L-% | INTRAVENOUS | Qty: 2000 | Fill #0

## 2024-04-02 MED FILL — AQUAPHOR ADVANCED THERAPY EX OINT: CUTANEOUS | Qty: 85 | Fill #0

## 2024-04-02 NOTE — Progress Notes (Signed)
Radial compression band removed at 1445 after slowly reducing air from 12 cc to zero as per hospital protocol.  No bleeding or hematoma noted. 2 x 2 gauze with tegaderm placed over puncture site. The affected extremity is warm and dry to the touch. Frequent vital signs printed and placed on bedside chart.  Patient instructed to call if any bleeding noted on gauze.  Patient verbalized understanding the nursing instructions.

## 2024-04-02 NOTE — Progress Notes (Signed)
 Hospitalist Progress Note   Admit Date:  04/01/2024  3:54 PM   Name:  [name] [name]   Age:  58 y.o.  Sex:  male  DOB:  Feb 28, 1966   MRN:  [id number]   Room:  406/01    Presenting/Chief Complaint: No chief complaint on file.     Reason(s) for Admission: NSTEMI (non-ST elevated myocardial infarction) [facility]) [I21.4]  Acute coronary syndrome [facility]) [I24.9]     Hospital Course:     [name] [name] is a 58 y.o. male with medical history of DM2, HTN, cigar smoking, HLP evaluated as a transfer from [facility] for chest pain. He is visiting his daughter from [place] who is expecting his grandchild.  Troponin 200-300  EKG reported as nonspecific ST segment changes , repeated on arrival as NSR with T wave inversions inferior leads   CXR negative     He has received IV heparin, asa,  and nitro paste   Cardiology following for [facility] 04-02-24       Subjective & 24hr Events:  04/02/24       Going for LHC  NPO  No chest pain   Not short of breath      Assessment & Plan:       NSTEMI  Chest pain  -IV heparin   -lipitor  -asa  -nitropaste   -cardiology consulted  -LHC pending for today           DM2:  -SSI           HTN:  -holding home meds         Anemia:  -HGB 11.5, trend       Hyponatremia:  -NA 133, trend          PT/OT evals ordered?  Not ordered; patient not expected to need rehab  Diet: ADULT DIET; Regular; 3 carb choices (45 gm/meal); Low Fat/Low Chol/High Fiber/NAS  Diet NPO  VTE prophylaxis: Heparin  Code status: Full Code  Code status discussed: Yes  Blood consent obtained: No        Non-peripheral Lines and Tubes (if present):          Telemetry (if present):  Cardiac/Telemetry Monitor On: Bedside monitor in use, Portable telemetry pack applied        Hospital Problems:  Principal Problem:    Acute coronary syndrome [facility])  Active Problems:    Chest pain    Hypertension    Type 2 diabetes mellitus (HCC)  Resolved Problems:    * No resolved hospital problems. *      Objective:   Patient Vitals for the past 24 hrs:   Temp  Pulse Resp BP SpO2   04/02/24 1300 -- 74 -- 129/80 --   04/02/24 1230 -- 78 -- 125/80 --   04/02/24 1200 -- 78 -- 123/70 --   04/02/24 1147 -- -- 16 -- --   04/02/24 1145 -- 82 -- 118/78 --   04/02/24 1141 98.1 F (36.7 C) 77 16 (!) 140/84 99 %   04/02/24 1117 -- -- 16 -- --   04/02/24 1020 97.2 F (36.2 C) 72 12 127/76 99 %   04/02/24 1000 -- 71 12 131/79 95 %   04/02/24 0945 -- 75 15 128/82 93 %   04/02/24 0930 -- 71 13 123/76 93 %   04/02/24 0928 -- 73 15 124/81 95 %   04/02/24 0815 -- -- -- -- 99 %   04/02/24 0735 98.4 F (36.9 C) 75 --  118/75 95 %   04/02/24 0502 99 F (37.2 C) 83 20 114/71 97 %   04/02/24 0036 98.2 F (36.8 C) 84 20 98/65 97 %   04/01/24 2152 99 F (37.2 C) 91 20 110/61 98 %   04/01/24 2018 -- 99 -- 124/76 --   04/01/24 1825 -- 98 -- (!) 146/87 --   04/01/24 1655 -- 91 -- 127/79 --   04/01/24 1604 99.1 F (37.3 C) 84 20 127/79 97 %       Oxygen Therapy  SpO2: 99 %  Pulse via Oximetry: 71 beats per minute  O2 Device: None (Room air)  O2 Flow Rate (L/min): 3 L/min    Estimated body mass index is 28.72 kg/m as calculated from the following:    Height as of this encounter: 1.753 m (5' 9).    Weight as of this encounter: 88.2 kg (194 lb 8 oz).    Intake/Output Summary (Last 24 hours) at 04/02/2024 1355  Last data filed at 04/02/2024 1145  Gross per 24 hour   Intake 1142.51 ml   Output 210 ml   Net 932.51 ml         Physical Exam:     General:    Well nourished. Alert, no distress    CV:   RRR.  No m/r/g.  No jugular venous distension. No edema   Lungs:   CTAB.  No wheezing, rhonchi, or rales.  Symmetric expansion. Anterior   Abdomen:   Soft, nontender, nondistended.  Extremities: No cyanosis or clubbing.  No edema  Skin:     No rashes.  Normal coloration.   Warm and dry.    Neuro:  grossly intact.    Psych:  Mood and affect appropriate      I have personally reviewed labs and tests:  Recent Labs:  Recent Results (from the past 48 hours)   EKG 12 Lead    Collection Time: 04/01/24  4:24 PM    Result Value Ref Range    Ventricular Rate 83 BPM    Atrial Rate 83 BPM    P-R Interval 180 ms    QRS Duration 90 ms    Q-T Interval 364 ms    QTc Calculation (Bazett) 427 ms    P Axis 55 degrees    R Axis 56 degrees    T Axis -20 degrees    Diagnosis       Normal sinus rhythm  T wave abnormality, consider inferior ischemia  Abnormal ECG  When compared with ECG of 05-Aug-2023 22:20,  MANUAL COMPARISON REQUIRED PREVIOUS ECG IS INCOMPATIBLE  Confirmed by SELA  MD (UC), MARK A ([id number]) on 04/01/2024 5:41:25 PM     POCT Glucose    Collection Time: 04/01/24  4:30 PM   Result Value Ref Range    POC Glucose 101 (H) 65 - 100 mg/dL    Performed by: BennettVenusPCT    Echo (TTE) complete (PRN contrast/bubble/strain/3D)    Collection Time: 04/01/24  4:38 PM   Result Value Ref Range    LV EDV A2C 106 mL    LV EDV A4C 94 mL    LV ESV A2C 65 mL    LV ESV A4C 53 mL    IVSd 1.3 (A) 0.6 - 1.0 cm    LVIDd 4.7 4.2 - 5.9 cm    LVIDs 3.2 cm    LVOT Diameter 2.1 cm    LVOT Mean Gradient 2 mmHg    LVOT VTI 17.4  cm    LVOT Peak Velocity 0.9 m/s    LVOT Peak Gradient 3 mmHg    LVPWd 1.4 (A) 0.6 - 1.0 cm    LV E' Lateral Velocity 9.14 cm/s    LV E' Septal Velocity 6.20 cm/s    LV Ejection Fraction A2C 39 %    LV Ejection Fraction A4C 44 %    EF BP 43 (A) 55 - 100 %    LVOT Area 3.5 cm2    LVOT SV 60.0 ml    LA Minor Axis 4.5 cm    LA Major Axis 5.8 cm    LA Area 2C 13.4 cm2    LA Area 4C 16.8 cm2    LA Volume MOD A2C 33 18 - 58 mL    LA Volume MOD A4C 39 18 - 58 mL    LA Diameter 3.6 cm    AV Peak Velocity 1.4 m/s    AV Peak Gradient 8 mmHg    AV Peak Gradient 8 mmHg    AV Mean Velocity 1.0 m/s    AV Mean Gradient 5 mmHg    AV VTI 30.0 cm    AV Area by VTI 2.0 cm2    AV Area by Peak Velocity 2.2 cm2    Aortic Root 3.2 cm    Ascending Aorta 3.3 cm    IVC Proxmal 1.4 cm    MV E Wave Deceleration Time 197.0 ms    MV A Velocity 0.73 m/s    MV E Velocity 0.77 m/s    MV Area by PHT 3.8 cm2    PV AT 112.0 ms    PV Max Velocity 0.8 m/s    PV  Peak Gradient 2 mmHg    Est. RA Pressure 3 mmHg    RV Free Wall Peak S' 15.2 cm/s   CBC    Collection Time: 04/01/24  4:53 PM   Result Value Ref Range    WBC 7.1 4.3 - 11.1 K/uL    RBC 4.53 4.23 - 5.6 M/uL    Hemoglobin 14.1 13.6 - 17.2 g/dL    Hematocrit 58.9 (L) 41.1 - 50.3 %    MCV 90.5 82 - 102 FL    MCH 31.1 26.1 - 32.9 PG    MCHC 34.4 31.4 - 35.0 g/dL    RDW 87.2 88.0 - 85.3 %    Platelets 196 150 - 450 K/uL    MPV 8.7 (L) 9.4 - 12.3 FL    nRBC 0.00 0.0 - 0.2 K/uL   Anti-XA, Heparin    Collection Time: 04/01/24  4:53 PM   Result Value Ref Range    Anti-XA Unfrac Heparin 0.27 (L) 0.3 - 0.7 IU/mL   Protime-INR    Collection Time: 04/01/24  4:53 PM   Result Value Ref Range    Protime 13.4 11.3 - 14.9 sec    INR 1.0     APTT    Collection Time: 04/01/24  4:53 PM   Result Value Ref Range    APTT 51.3 (H) 23.3 - 37.4 SEC   POCT Glucose    Collection Time: 04/01/24  9:02 PM   Result Value Ref Range    POC Glucose 152 (H) 65 - 100 mg/dL    Performed by: DanielCarriePCT    Basic Metabolic Panel w/ Reflex to MG    Collection Time: 04/02/24  3:18 AM   Result Value Ref Range    Sodium 133 (L) 136 - 145 mmol/L  Potassium 4.2 3.5 - 5.1 mmol/L    Chloride 101 98 - 107 mmol/L    CO2 21 20 - 29 mmol/L    Anion Gap 11 7 - 16 mmol/L    Glucose 134 (H) 70 - 99 mg/dL    BUN 19 6 - 23 MG/DL    Creatinine 8.80 9.19 - 1.30 MG/DL    Est, Glom Filt Rate 71 >60 ml/min/1.55m2    Calcium 8.2 (L) 8.8 - 10.2 MG/DL   CBC with Auto Differential    Collection Time: 04/02/24  3:18 AM   Result Value Ref Range    WBC 6.0 4.3 - 11.1 K/uL    RBC 3.70 (L) 4.23 - 5.6 M/uL    Hemoglobin 11.5 (L) 13.6 - 17.2 g/dL    Hematocrit 66.0 (L) 41.1 - 50.3 %    MCV 91.6 82 - 102 FL    MCH 31.1 26.1 - 32.9 PG    MCHC 33.9 31.4 - 35.0 g/dL    RDW 87.2 88.0 - 85.3 %    Platelets 197 150 - 450 K/uL    MPV 9.2 (L) 9.4 - 12.3 FL    nRBC 0.00 0.0 - 0.2 K/uL    Differential Type AUTOMATED      Neutrophils % 43.3 43.0 - 78.0 %    Lymphocytes % 42.5 13.0 - 44.0 %     Monocytes % 10.4 4.0 - 12.0 %    Eosinophils % 2.7 0.5 - 7.8 %    Basophils % 0.8 0.0 - 2.0 %    Immature Granulocytes % 0.3 0.0 - 5.0 %    Neutrophils Absolute 2.61 1.70 - 8.20 K/UL    Lymphocytes Absolute 2.56 0.50 - 4.60 K/UL    Monocytes Absolute 0.63 0.10 - 1.30 K/UL    Eosinophils Absolute 0.16 0.00 - 0.80 K/UL    Basophils Absolute 0.05 0.00 - 0.20 K/UL    Immature Granulocytes Absolute 0.02 0.0 - 0.5 K/UL   Hemoglobin A1c    Collection Time: 04/02/24  3:18 AM   Result Value Ref Range    Hemoglobin A1C 5.9 (H) 0 - 5.6 %    Estimated Avg Glucose 121 mg/dL   Troponin    Collection Time: 04/02/24  3:18 AM   Result Value Ref Range    Troponin T 377.0 (HH) 0 - 22 ng/L   Anti-XA, Heparin    Collection Time: 04/02/24  5:35 AM   Result Value Ref Range    Anti-XA Unfrac Heparin 0.36 0.3 - 0.7 IU/mL   POCT Glucose    Collection Time: 04/02/24  7:36 AM   Result Value Ref Range    POC Glucose 129 (H) 65 - 100 mg/dL    Performed by: BlackAlexisPCA    POCT activated clotting time    Collection Time: 04/02/24  8:58 AM   Result Value Ref Range    Activated Clotting Time 262 (H) 70 - 128 SECS   Cardiac procedure    Collection Time: 04/02/24  9:21 AM   Result Value Ref Range    Body Surface Area 2.06 m2   EKG 12 Lead    Collection Time: 04/02/24 10:28 AM   Result Value Ref Range    Ventricular Rate 71 BPM    Atrial Rate 71 BPM    P-R Interval 190 ms    QRS Duration 92 ms    Q-T Interval 410 ms    QTc Calculation (Bazett) 445 ms    P Axis 53 degrees  R Axis 51 degrees    T Axis 29 degrees    Diagnosis       Normal sinus rhythm  Normal ECG  When compared with ECG of 01-Apr-2024 16:24,  No significant change was found     EKG 12 lead    Collection Time: 04/02/24 10:28 AM   Result Value Ref Range    Ventricular Rate 71 BPM    Atrial Rate 71 BPM    P-R Interval 190 ms    QRS Duration 92 ms    Q-T Interval 410 ms    QTc Calculation (Bazett) 445 ms    P Axis 53 degrees    R Axis 51 degrees    T Axis 29 degrees    Diagnosis        Normal sinus rhythm  Non-specific ST-t wave changes  When compared with ECG of 01-Apr-2024 16:24,  No significant change was found  Confirmed by CLAUDENE  MD (UC), CHRISTOPHER H [phone]) on 04/02/2024 10:43:25 AM     POCT Glucose    Collection Time: 04/02/24 11:42 AM   Result Value Ref Range    POC Glucose 176 (H) 65 - 100 mg/dL    Performed by: BlackAlexisPCA        No results for input(s): COVID19 in the last 72 hours.    Current Meds:  Current Facility-Administered Medications   Medication Dose Route Frequency    sodium chloride  flush 0.9 % injection 5-40 mL  5-40 mL IntraVENous 2 times per day    sodium chloride  flush 0.9 % injection 5-40 mL  5-40 mL IntraVENous PRN    0.9 % sodium chloride  infusion   IntraVENous PRN    acetaminophen (TYLENOL) tablet 650 mg  650 mg Oral Q4H PRN    traMADol (ULTRAM) tablet 50 mg  50 mg Oral Q6H PRN    Or    traMADol (ULTRAM) tablet 100 mg  100 mg Oral Q6H PRN    morphine  (PF) injection 2 mg  2 mg IntraVENous Q2H PRN    Or    morphine  sulfate (PF) injection 4 mg  4 mg IntraVENous Q2H PRN    atropine injection 0.5 mg  0.5 mg IntraVENous Once PRN    sodium chloride  0.9 % bolus 500 mL  500 mL IntraVENous PRN    valsartan (DIOVAN) tablet 40 mg  40 mg Oral 2 times per day    ticagrelor (BRILINTA) tablet 90 mg  90 mg Oral BID    mineral oil-hydrophilic petrolatum (AQUAPHOR) ointment   Topical BID    gabapentin (NEURONTIN) capsule 800 mg  800 mg Oral TID    sodium chloride  flush 0.9 % injection 5-40 mL  5-40 mL IntraVENous 2 times per day    sodium chloride  flush 0.9 % injection 5-40 mL  5-40 mL IntraVENous PRN    0.9 % sodium chloride  infusion   IntraVENous PRN    ondansetron (ZOFRAN-ODT) disintegrating tablet 4 mg  4 mg Oral Q8H PRN    Or    ondansetron (ZOFRAN) injection 4 mg  4 mg IntraVENous Q6H PRN    polyethylene glycol (GLYCOLAX) packet 17 g  17 g Oral Daily PRN    acetaminophen (TYLENOL) suppository 650 mg  650 mg Rectal Q6H PRN    heparin (porcine) injection 4,000 Units  4,000  Units IntraVENous PRN    heparin (porcine) injection 2,000 Units  2,000 Units IntraVENous PRN    heparin 25,000 units in dextrose 5% 250 mL (premix) infusion  5-30 Units/kg/hr IntraVENous Continuous    glucose chewable tablet 16 g  4 tablet Oral PRN    dextrose bolus 10% 125 mL  125 mL IntraVENous PRN    Or    dextrose bolus 10% 250 mL  250 mL IntraVENous PRN    Glucagon Emergency KIT 1 mg  1 mg SubCUTAneous PRN    dextrose 10 % infusion   IntraVENous Continuous PRN    insulin lispro (HUMALOG,ADMELOG) injection vial 0-4 Units  0-4 Units SubCUTAneous 4x Daily AC & HS    nitroglycerin (NITRO-BID) 2 % ointment 0.5 inch  0.5 inch Topical 4 times per day    [Held by provider] hydroCHLOROthiazide (HYDRODIURIL) tablet 25 mg  25 mg Oral BID    pantoprazole (PROTONIX) tablet 40 mg  40 mg Oral QAM AC    metoprolol tartrate (LOPRESSOR) tablet 25 mg  25 mg Oral BID    aspirin EC tablet 81 mg  81 mg Oral Daily    atorvastatin (LIPITOR) tablet 40 mg  40 mg Oral Nightly       Signed:  Dorian Hals, MD    Part of this note may have been written by using a voice dictation software.  The note has been proof read but may still contain some grammatical/other typographical errors.

## 2024-04-02 NOTE — Plan of Care (Signed)
 Problem: Chronic Conditions and Co-morbidities  Goal: Patient's chronic conditions and co-morbidity symptoms are monitored and maintained or improved  04/02/2024 1033 by Caylon Saine, RN  Outcome: Progressing  Flowsheets (Taken 04/02/2024 0745)  Care Plan - Patient's Chronic Conditions and Co-Morbidity Symptoms are Monitored and Maintained or Improved:   Monitor and assess patient's chronic conditions and comorbid symptoms for stability, deterioration, or improvement   Collaborate with multidisciplinary team to address chronic and comorbid conditions and prevent exacerbation or deterioration   Update acute care plan with appropriate goals if chronic or comorbid symptoms are exacerbated and prevent overall improvement and discharge  04/02/2024 0634 by Reyes Muscat, RN  Outcome: Progressing     Problem: Discharge Planning  Goal: Discharge to home or other facility with appropriate resources  04/02/2024 1033 by Khyri Hinzman, RN  Outcome: Progressing  Flowsheets (Taken 04/02/2024 0745)  Discharge to home or other facility with appropriate resources:   Identify barriers to discharge with patient and caregiver   Arrange for needed discharge resources and transportation as appropriate   Identify discharge learning needs (meds, wound care, etc)   Refer to discharge planning if patient needs post-hospital services based on physician order or complex needs related to functional status, cognitive ability or social support system  04/02/2024 0634 by Reyes Muscat, RN  Outcome: Progressing     Problem: Safety - Adult  Goal: Free from fall injury  04/02/2024 1033 by Henry Foot, RN  Outcome: Progressing  04/02/2024 0634 by Reyes Muscat, RN  Outcome: Progressing     Problem: ABCDS Injury Assessment  Goal: Absence of physical injury  04/02/2024 1033 by Henry Foot, RN  Outcome: Progressing  04/02/2024 0634 by Reyes Muscat, RN  Outcome: Progressing     Problem: Pain  Goal: Verbalizes/displays adequate  comfort level or baseline comfort level  04/02/2024 1033 by Henry Foot, RN  Outcome: Progressing  04/02/2024 0634 by Reyes Muscat, RN  Outcome: Progressing

## 2024-04-02 NOTE — Progress Notes (Signed)
 TRANSFER - IN REPORT:    Verbal report received from [facility] on [name] [name]  being received from Cath Lab for routine progression of patient care      Report consisted of patient's Situation, Background, Assessment and   Recommendations(SBAR).     Information from the following report(s) Nurse Handoff Report, Adult Overview, Surgery Report, and Neuro Assessment was reviewed with the receiving nurse.    Opportunity for questions and clarification was provided.      Assessment completed upon patient's arrival to unit and care assumed.

## 2024-04-02 NOTE — Wound Image (Signed)
 Patient seen by wound care for left hallux wound.    Patient supine, HOB slightly elevated, alert and awake. Relates he is followed by an outpatient wound care clinic in [place]  for the left great toe. He is being set up with a podiatrist. He also mentioned limited circulation to the LLE and is being set up with vascular.     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     Left sub IP joint with callus. Center of callus with dried scab - unsure if any wound open underneath - there is no drainage. Toe itself non-edematous, non-erythematous. Callus is beginning to peal off. Left PT and DP un-palpable, right palpable however. Foot is warm and capillary refill less than 3 seconds. Foot and callus dry - I recommend Aquaphor.

## 2024-04-02 NOTE — Progress Notes (Signed)
 TRANSFER - OUT REPORT:    Verbal report given to Julianna, RN on [name] [name]  being transferred to 406 for routine progression of patient care       Report consisted of patient's Situation, Background, Assessment and Recommendations(SBAR).     Information from the following report(s) Procedure Summary was reviewed with the receiving nurse.    Opportunity for questions and clarification was provided.

## 2024-04-02 NOTE — Progress Notes (Signed)
 TRANSFER - OUT REPORT:    Verbal report given to sloan, RN on [employer]  being transferred to [employer] for ordered procedure       Report consisted of patient's Situation, Background, Assessment and Recommendations(SBAR).     Information from the following report(s) Procedure Summary, MAR, and Med Rec Status was reviewed with the receiving nurse.    Opportunity for questions and clarification was provided.      LHC by Dr. landy  Right radial access  X1 stent to LAD, IVUS to LAD  Right wrist TR band with 12  mL  3 mg Versed  75 mcg Fentanyl  9,000 units of heparin  180 mg brilinta  Access site soft. Clean, dry, and intact; with no signs of bleeding or hematoma  Pt. Tolerated procedure

## 2024-04-02 NOTE — Care Coordination-Inpatient (Addendum)
 Pt presented to [facility] for chest pain that moved to his LUE. Pt was subsequently transferred to [facility] for continuation of care and admitted for NSTMI. PMHx includes: DM2, HTN, cigar smoking and HLP. Pt is from [place] and visiting his dtr in [facility] who is expecting. At baseline, pt is indep with his ADLs. Lives with his spouse. A/Ox4. On RA. Drives. Denies DME needs or current [facility] services. PCP established. [facility] established and able to afford home meds. [facility] Ref ID #[id number]. Pt is s/p LHC with Dr. Landy today. No anticipated discharge needs identified, will remain available.     04/02/24 1054   Service Assessment   Information Provided By Patient   Patient Orientation Alert   Cognition No Apparent Deficit   Primary Caregiver Self   Support Systems Spouse/Significant Other;Children;Family Members;Church/Faith Community;Friends/Neighbors   Patient's Healthcare Decision Maker is: Legal Next of Kin   Prior Functional Level Independent in ADLs/IADLs   Current Functional Level at Time of Initial Assessment Independent in ADLs/IADLs   Can patient return to prior living arrangement Yes   Ability to make needs known: Good   Family able to assist with home care needs: Yes   Receives Help From [facility]   Current [employer] None   Social/Functional History   Prior Level of Assist for ADLs Independent   Prior Level of Assist for [employer] Independent   Prior Level of Assist for Transfers Independent   Artist Yes   Occupation Retired   Surveyor, minerals Prior to Acute Admission House   Lives With Spouse   Potential Assistance Obtaining Medications Yes   Patient expects to be discharged to: Research officer, political party At/After Discharge   [employer] Information Provided? Yes   Who will provide transportation at discharge? Family

## 2024-04-02 NOTE — Progress Notes (Signed)
 TRANSFER - IN REPORT:    Verbal report received from [place] [facility], RN on [name] [name] being received from cath lab for routine progression of patient care      Report consisted of patient's Situation, Background, Assessment and Recommendations(SBAR).     Information from the following report(s) Procedure Summary was reviewed with the receiving nurse.    Opportunity for questions and clarification was provided.      Assessment completed upon patient's arrival to unit and care assumed.

## 2024-04-02 NOTE — Progress Notes (Addendum)
 [facility] PROGRESS NOTE           04/02/2024 8:15 AM    Admit Date: 04/01/2024         Subjective: Denies CP overnight, planning on LHC today.    ROS:  Cardiovascular:  As noted above    Objective:      Vitals:    04/02/24 0036 04/02/24 0502 04/02/24 0735 04/02/24 0815   BP: 98/65 114/71 118/75    Pulse: 84 83 75    Resp: 20 20     Temp: 98.2 F (36.8 C) 99 F (37.2 C) 98.4 F (36.9 C)    TempSrc:   Oral    SpO2: 97% 97% 95% 99%   Weight:  88.2 kg (194 lb 8 oz)     Height:           On telemetry:sr      Physical Exam:  General: Well Developed, Well Nourished, No Acute Distress, Alert & Oriented x 3, Appropriate mood  Neck: supple, no JVD  Heart: S1S2 with RRR without murmurs or gallops  Lungs: Clear throughout auscultation bilaterally without adventitious sounds  Abd: soft, nontender, nondistended, with good bowel sounds  Ext: no edema bilaterally  Skin: warm and dry      Data Review:   Recent Labs     04/01/24  1653 04/02/24  0318   NA  --  133*   K  --  4.2   BUN  --  19   WBC 7.1 6.0   HGB 14.1 11.5*   HCT 41.0* 33.9*   PLT 196 197   INR 1.0  --        No results for input(s): TNIPOC in the last 72 hours.    Invalid input(s): TROIQ          Assessment/Plan:     Principal Problem:    Acute coronary syndrome (HCC)  Active Problems:    Chest pain    Hypertension    Type 2 diabetes mellitus (HCC)  Resolved Problems:    * No resolved hospital problems. *    A/P  1) CP with elevated cardiac biomarkers concerning for NSTEMI - LHC today  2) Lipids - atorvastatin 40 mg daily check lipid panel  3) HTN - continue metop 25 mg BID    Discussed risk of cardiac catheterization with patient in detail.  The risk of a major complication (death, MI or major embolization) during or after cardiac catheterization is below 1%.  Risk of mortality is under 0.1%.  Local complications at the site of catheter insertion were discussed.  This includes but not limited to:  acute thrombosis, distal  embolization, dissection, poorly controlled bleeding, hematoma, retroperitoneal hemorrhage, pseudoaneurysm or AV fistula formation.  Other potential serious complication were discussed including risk of cardiac arrhythmias, allergic reactions, atheroemboli and acute kidney injury.      Toribio Seip, MD  04/02/2024 8:15 AM    Post Cath Addendum: PCI to mLAD with visible ruptured plaque.  CTO of the RCA with inferior WMA noted as well.  Started on brilinta post PCI to LAD, added ARB as well in the setting of reduced LV function.    Toribio Seip, MD  04/02/24  9:45 AM

## 2024-04-03 LAB — BASIC METABOLIC PANEL
Anion Gap: 10 mmol/L (ref 7–16)
BUN: 17 mg/dL (ref 6–23)
CO2: 22 mmol/L (ref 20–29)
Calcium: 8.2 mg/dL — ABNORMAL LOW (ref 8.8–10.2)
Chloride: 103 mmol/L (ref 98–107)
Creatinine: 1.13 mg/dL (ref 0.80–1.30)
Est, Glom Filt Rate: 76 ml/min/1.73m2 (ref >60)
Glucose: 115 mg/dL — ABNORMAL HIGH (ref 70–99)
Potassium: 4.5 mmol/L (ref 3.5–5.1)
Sodium: 134 mmol/L — ABNORMAL LOW (ref 136–145)

## 2024-04-03 LAB — CBC
Hematocrit: 34.4 % — ABNORMAL LOW (ref 41.1–50.3)
Hemoglobin: 11.9 g/dL — ABNORMAL LOW (ref 13.6–17.2)
MCH: 31.3 pg (ref 26.1–32.9)
MCHC: 34.6 g/dL (ref 31.4–35.0)
MCV: 90.5 FL (ref 82–102)
MPV: 8.9 FL — ABNORMAL LOW (ref 9.4–12.3)
Platelets: 186 K/uL (ref 150–450)
RBC: 3.8 M/uL — ABNORMAL LOW (ref 4.23–5.6)
RDW: 12.7 % (ref 11.9–14.6)
WBC: 5.2 K/uL (ref 4.3–11.1)
nRBC: 0 K/uL (ref 0.0–0.2)

## 2024-04-03 LAB — POCT GLUCOSE
POC Glucose: 159 mg/dL — ABNORMAL HIGH (ref 65–100)
POC Glucose: 121 mg/dL — ABNORMAL HIGH (ref 65–100)

## 2024-04-03 MED ORDER — METOPROLOL TARTRATE 25 MG PO TABS
25 | ORAL_TABLET | Freq: Two times a day (BID) | ORAL | 3 refills | 30.00000 days | Status: DC
Start: 2024-04-03 — End: 2024-04-03

## 2024-04-03 MED ORDER — ASPIRIN 81 MG PO TBEC
81 | ORAL_TABLET | Freq: Every day | ORAL | 3 refills | 30.00000 days | Status: AC
Start: 2024-04-03 — End: ?

## 2024-04-03 MED ORDER — VALSARTAN 40 MG PO TABS
40 | ORAL_TABLET | Freq: Two times a day (BID) | ORAL | 3 refills | 30.00000 days | Status: DC
Start: 2024-04-03 — End: 2024-04-03

## 2024-04-03 MED ORDER — OXYCODONE-ACETAMINOPHEN 5-325 MG PO TABS
5-325 | Freq: Once | ORAL | Status: AC
Start: 2024-04-03 — End: 2024-04-03
  Administered 2024-04-03: 07:00:00 1 via ORAL

## 2024-04-03 MED ORDER — TICAGRELOR 90 MG PO TABS
90 | ORAL_TABLET | Freq: Two times a day (BID) | ORAL | 0 refills | 30.00000 days | Status: DC
Start: 2024-04-03 — End: 2024-04-03

## 2024-04-03 MED ORDER — ATORVASTATIN CALCIUM 40 MG PO TABS
40 | ORAL_TABLET | Freq: Every evening | ORAL | 3 refills | 30.00000 days | Status: AC
Start: 2024-04-03 — End: ?

## 2024-04-03 MED ORDER — NITROGLYCERIN 0.4 MG SL SUBL
0.4 | ORAL_TABLET | SUBLINGUAL | 3 refills | 8.00000 days | Status: AC | PRN
Start: 2024-04-03 — End: ?

## 2024-04-03 MED ORDER — ATORVASTATIN CALCIUM 40 MG PO TABS
40 | ORAL_TABLET | Freq: Every evening | ORAL | 3 refills | 30.00000 days | Status: DC
Start: 2024-04-03 — End: 2024-04-03

## 2024-04-03 MED ORDER — NITROGLYCERIN 0.4 MG SL SUBL
0.4 | ORAL_TABLET | SUBLINGUAL | 3 refills | 8.00000 days | Status: DC | PRN
Start: 2024-04-03 — End: 2024-04-03

## 2024-04-03 MED ORDER — VALSARTAN 40 MG PO TABS
40 | ORAL_TABLET | Freq: Two times a day (BID) | ORAL | 3 refills | 90.00000 days | Status: DC
Start: 2024-04-03 — End: 2024-04-29

## 2024-04-03 MED ORDER — METOPROLOL TARTRATE 25 MG PO TABS
25 | ORAL_TABLET | Freq: Two times a day (BID) | ORAL | 3 refills | 30.00000 days | Status: DC
Start: 2024-04-03 — End: 2024-04-29

## 2024-04-03 MED ORDER — TICAGRELOR 90 MG PO TABS
90 | ORAL_TABLET | Freq: Two times a day (BID) | ORAL | 0 refills | 30.00000 days | Status: AC
Start: 2024-04-03 — End: ?

## 2024-04-03 MED FILL — VALSARTAN 40 MG PO TABS: 40 mg | ORAL | Qty: 1 | Fill #0

## 2024-04-03 MED FILL — TICAGRELOR 90 MG PO TABS: 90 mg | ORAL | Qty: 1 | Fill #0

## 2024-04-03 MED FILL — OXYCODONE-ACETAMINOPHEN 5-325 MG PO TABS: 5-325 mg | ORAL | Qty: 1 | Fill #0

## 2024-04-03 MED FILL — GABAPENTIN 400 MG PO CAPS: 400 mg | ORAL | Qty: 2 | Fill #0

## 2024-04-03 MED FILL — METOPROLOL TARTRATE 25 MG PO TABS: 25 mg | ORAL | Qty: 1 | Fill #0

## 2024-04-03 MED FILL — NITRO-BID 2 % TD OINT: 2 % | TRANSDERMAL | Qty: 1 | Fill #0

## 2024-04-03 MED FILL — ATORVASTATIN CALCIUM 40 MG PO TABS: 40 mg | ORAL | Qty: 1 | Fill #0

## 2024-04-03 MED FILL — PANTOPRAZOLE SODIUM 40 MG PO TBEC: 40 mg | ORAL | Qty: 1 | Fill #0

## 2024-04-03 MED FILL — ASPIRIN LOW DOSE 81 MG PO TBEC: 81 mg | ORAL | Qty: 1 | Fill #0

## 2024-04-03 NOTE — Progress Notes (Signed)
"  SPIRITUAL HEALTH  Note for Med/Surg Visit                  Room # 406/01    Name: [name] [name]           Age: 58 y.o.    Gender: male          MRN: [id number]  Religion: Christian       Preferred Language: English    Date: 04/03/24  Visit Time: Begin Time: (P) 0905 End Time : (P) 0910 Complexity of Encounter: (P) Low      Visit Summary: Chaplain met with patient through regular rounds. Patient said he was doing great and said he enjoyed spending time with his grandchildren.. Patient expressed that he is Christian and has a church home. Patient's social support includes his wife and siblings. We prayed together for healing. Chaplain provided active listening, discussion of illness, space for expression of feelings, spiritual support, and a ministry of presence. Chaplain will follow up as necessary or at patient's request.       Referral/Consult From: (P) Rounding  Encounter Overview/Reason: (P) Spiritual/Emotional Needs  Encounter Code:     Crisis (if applicable):    Service Provided For: (P) Patient     Patient was available.    Faith, Belief, Meaning:   Patient identifies as spiritual  is connected with a faith tradition or spiritual practice  has beliefs or practices that help with coping during difficult times  Family/Friends No family/friends present  Rituals (if applicable)      Importance and Influence:  Patient does not have spiritual/personal beliefs that influence decisions regarding their health  Family/Friends No family/friends present    Community:  Patient   is connected with a spiritual community  Support System Includes   (P) Spouse, Family members   Family/Friends   No family/ friends present.    Assessment and Plan of Care:   Emotions Expressed by Patient:   Assessment: (P) Calm    Interventions by Chaplain:   Intervention: (P) Active listening, Sustaining Presence/Ministry of presence, Explored/Affirmed feelings, thoughts, concerns, Discussed illness injury and its impact, Discussed belief  system/religious practices/faith, Prayer (assurance of)/Blessing     Result/ Response by Patient:   Outcome: (P) Acceptance, Engaged in conversation    Patient Plan of Care:   Plan and Referrals  Plan/Referrals: (P) Continue Support (comment)     Electronically signed by Elia FELICIANO PINAL , BCC, on 04/03/2024 at 3:21 PM.  Spiritual Health  [facility]  [phone]    "

## 2024-04-03 NOTE — Progress Notes (Signed)
"  Cardiac Rehab: Spoke with patient regarding referral to cardiac rehab. Patient meets admission criteria based on NSTEMI with PCI (04/02/24).  Written information about Cardiac Rehab given and reviewed with patient. Discussed lifestyle modifications to promote cardiac wellness. Patient indicated that he may want to participate in the cardiac rehab program. He  lives in [place], [place] and states he will pursue Cardiac Rehab at a local program. His Cardiologist is Dr. Sela.      Thank you,  Joen Pines, RN, BSN  Cardiopulmonary Rehabilitation Nurse Liaison  [facility]    "

## 2024-04-03 NOTE — Progress Notes (Signed)
"  Discharge instructions were reviewed with patient. An opportunity was given for questions. All medications were reviewed, and information was given on the new medications. Patient verbalized understanding, and has no questions at this time.   "

## 2024-04-03 NOTE — Discharge Summary (Signed)
 "  Hospitalist Discharge Summary   Admit Date:  04/01/2024  3:54 PM   DC Note date: 04/03/2024  Name:  [name] [name]   Age:  58 y.o.  Sex:  male  DOB:  10/21/1965   MRN:  [id number]   Room:  406/01  PCP:  Mcarthur Everitt SAUNDERS, MD    Presenting Complaint: No chief complaint on file.     Initial Admission Diagnosis: NSTEMI (non-ST elevated myocardial infarction) (HCC) [I21.4]  Acute coronary syndrome (HCC) [I24.9]     Problem List for this Hospitalization (present on admission):    Principal Problem (Resolved):    Acute coronary syndrome (HCC)  Active Problems:    Hypertension    Type 2 diabetes mellitus (HCC)  Resolved Problems:    Chest pain      Hospital Course:    [name] [name] is a 58 y.o. male with medical history of DM2, HTN, cigar smoking, HLP evaluated as a transfer from [facility] for chest pain. He is visiting his daughter from [place] who is expecting his grandchild.  Troponin 200-300  EKG reported as nonspecific ST segment changes , repeated on arrival as NSR with T wave inversions inferior leads   CXR negative      He received IV heparin , asa, and nitro paste.   Cardiology following for [facility] 04-02-24 s/p finding of acute plaque rupture mLAD s/p PCA with DES.   ECHO preserved EF, moderate to severe Aortic regurgitation.   He will be on asa, brilinta , metoprolol , diovan , lipitor .  He has mild hyponatremia, NA 134 and anemia, HGB 11.0.   [facility]        Disposition: Home  Diet: ADULT DIET; Regular  Code Status: Full Code    Follow Ups:  Follow-up Information       Follow up With Specialties Details Why Contact Info    Saxena, Naveen R, MD Cardiovascular Disease Follow up in 1 week(s)  [address]  [place] [address]  [phone]      [facility] Cardiology Follow up in 1 week(s)  [address]  [place]  [id number]  [phone]            Future Appointments         Provider Specialty Dept Phone    04/08/2024 1:15 PM Gerard Elsie KANDICE DOUGLAS, MD Cardiology [phone]            Follow up labs/diagnostics (ultimately defer to outpatient provider):  CBC and BMP, [facility] week [facility]    Plan was discussed with Patient.  All questions answered.  Patient was stable at time of discharge.  Instructions given to call a physician or return if any concerns.            Current Discharge Medication List        START taking these medications    Details   aspirin  81 MG EC tablet Take 1 tablet by mouth daily  Qty: 30 tablet, Refills: 3      nitroGLYCERIN  (NITROSTAT ) 0.4 MG SL tablet Place 1 tablet under the tongue every 5 minutes as needed for Chest pain up to max of 3 total doses. If no relief after 1 dose, call 911.  Qty: 25 tablet, Refills: 3      atorvastatin  (LIPITOR ) 40 MG tablet Take 1 tablet by mouth nightly  Qty: 30 tablet, Refills: 3      valsartan  (DIOVAN ) 40 MG  tablet Take 1 tablet by mouth every 12 hours  Qty: 30 tablet, Refills: 3      metoprolol  tartrate (LOPRESSOR ) 25 MG tablet Take 1 tablet by mouth 2 times daily  Qty: 60 tablet, Refills: 3      ticagrelor  (BRILINTA ) 90 MG TABS tablet Take 1 tablet by mouth 2 times daily  Qty: 60 tablet, Refills: 0           CONTINUE these medications which have NOT CHANGED    Details   omeprazole (PRILOSEC) 40 MG delayed release capsule Take 1 capsule by mouth      oxyCODONE -acetaminophen  (PERCOCET ) 10-325 MG per tablet Take by mouth.      gabapentin  (NEURONTIN ) 400 MG capsule Take 2 capsules by mouth.           STOP taking these medications       naproxen  (NAPROSYN ) 500 MG tablet Comments:   Reason for Stopping:         amLODIPine (NORVASC) 10 MG tablet Comments:   Reason for Stopping:         insulin  aspart (NOVOLOG) 100 UNIT/ML injection vial Comments:   Reason for Stopping:         hydroCHLOROthiazide  (HYDRODIURIL ) 25 MG tablet Comments:   Reason for Stopping:         glipiZIDE (GLUCOTROL) 10 MG tablet Comments:   Reason for Stopping:               Some of the medications may be marked as stop taking by  the system; but in reality patient or family reported already being off these meds; defer to outpatient/prescribing providers.      Procedures done this admission:  Procedure(s):  Left heart cath / coronary angiography  Percutaneous coronary intervention  Intravascular ultrasound    Consults this admission:  IP CONSULT TO CARDIOLOGY    Consultants will arrange their own follow ups, if applicable/necessary.    Echocardiogram results:  04/01/24    ECHO (TTE) COMPLETE (PRN CONTRAST/BUBBLE/STRAIN/3D) 04/02/2024  3:53 PM (Final)    Interpretation Summary    Left Ventricle: Low normal left ventricular systolic function with a visually estimated EF of 50 - 55%. Left ventricle size is normal. Moderately increased wall thickness. Findings consistent with moderate concentric hypertrophy. Normal wall motion. Normal diastolic function.    Right Ventricle: Right ventricle size is normal. Normal systolic function.    Aortic Valve: Trileaflet valve. Mild sclerosis of the aortic valve cusps. Moderate to severe regurgitation.    Mitral Valve: Mild regurgitation.  Addendum by: Vicenta Dorn Ned, DO on 04/02/2024  5:43 PM    Signed by: Dorn Ned Vicenta, DO on 04/02/2024  3:53 PM      Diagnostic Imaging/Tests:   Echo (TTE) complete (PRN contrast/bubble/strain/3D)  Addendum Date: 04/02/2024    Left Ventricle: Low normal left ventricular systolic function with a visually estimated EF of 50 - 55%. Left ventricle size is normal. Moderately increased wall thickness. Findings consistent with moderate concentric hypertrophy. Normal wall motion. Normal diastolic function.   Right Ventricle: Right ventricle size is normal. Normal systolic function.   Aortic Valve: Trileaflet valve. Mild sclerosis of the aortic valve cusps. Moderate to severe regurgitation.   Mitral Valve: Mild regurgitation.        Labs: Results:       BMP, Mg, Phos Recent Labs     04/02/24  0318 04/03/24  0307   NA 133* 134*   K 4.2 4.5   CL 101 103  CO2 21 22    ANIONGAP 11 10   BUN 19 17   CREATININE 1.19 1.13   LABGLOM 71 76   CALCIUM  8.2* 8.2*   GLUCOSE 134* 115*      CBC Recent Labs     04/01/24  1653 04/02/24  0318 04/03/24  0307   WBC 7.1 6.0 5.2   RBC 4.53 3.70* 3.80*   HGB 14.1 11.5* 11.9*   HCT 41.0* 33.9* 34.4*   MCV 90.5 91.6 90.5   MCH 31.1 31.1 31.3   MCHC 34.4 33.9 34.6   RDW 12.7 12.7 12.7   PLT 196 197 186   MPV 8.7* 9.2* 8.9*   NRBC 0.00 0.00 0.00   LYMPHOPCT  --  42.5  --    MONOPCT  --  10.4  --    BASOPCT  --  0.8  --    IMMGRAN  --  0.3  --    LYMPHSABS  --  2.56  --    EOSABS  --  0.16  --    MONOSABS  --  0.63  --    BASOSABS  --  0.05  --    ABSIMMGRAN  --  0.02  --       LFT No results for input(s): BILITOT, BILIDIR, ALKPHOS, AST, ALT, PROTEIN, ALBUMIN, GLOB in the last 72 hours.   Cardiac  No results found for: NTPROBNP, TROPHS   Coags Lab Results   Component Value Date/Time    PROTIME 13.4 04/01/2024 04:53 PM    INR 1.0 04/01/2024 04:53 PM    APTT 51.3 04/01/2024 04:53 PM      A1c Lab Results   Component Value Date/Time    LABA1C 5.9 04/02/2024 03:18 AM    EAG 121 04/02/2024 03:18 AM      Lipids No results found for: CHOL, HDL, CHOLHDLRATIO, TRIG   Thyroid  No results found for: TSHELE, TSH3GEN     Most Recent UA No results found for: COLORU, APPEARANCE, SPECGRAV, LABPH, PROTEINU, GLUCOSEU, KETUA, BILIRUBINUR, BLOODU, UROBILINOGEN, NITRU, LEUKOCYTESUR, WBCUA, RBCUA, BACTERIA, LABCAST, MUCUS     Microbiology:  Results       ** No results found for the last 336 hours. **            All Labs from Last 24 Hrs:  Recent Results (from the past 24 hours)   EKG 12 Lead    Collection Time: 04/02/24 10:28 AM   Result Value Ref Range    Ventricular Rate 71 BPM    Atrial Rate 71 BPM    P-R Interval 190 ms    QRS Duration 92 ms    Q-T Interval 410 ms    QTc Calculation (Bazett) 445 ms    P Axis 53 degrees    R Axis 51 degrees    T Axis 29 degrees    Diagnosis       Normal sinus rhythm  Normal  ECG  When compared with ECG of 01-Apr-2024 16:24,  No significant change was found     EKG 12 lead    Collection Time: 04/02/24 10:28 AM   Result Value Ref Range    Ventricular Rate 71 BPM    Atrial Rate 71 BPM    P-R Interval 190 ms    QRS Duration 92 ms    Q-T Interval 410 ms    QTc Calculation (Bazett) 445 ms    P Axis 53 degrees    R Axis 51 degrees  T Axis 29 degrees    Diagnosis       Normal sinus rhythm  Non-specific ST-t wave changes  When compared with ECG of 01-Apr-2024 16:24,  No significant change was found  Confirmed by [facility]  MD (UC), CHRISTOPHER H [phone]) on 04/02/2024 10:43:25 AM     POCT Glucose    Collection Time: 04/02/24 11:42 AM   Result Value Ref Range    POC Glucose 176 (H) 65 - 100 mg/dL    Performed by: BlackAlexisPCA    Echo (TTE) complete (PRN contrast/bubble/strain/3D)    Collection Time: 04/02/24  3:29 PM   Result Value Ref Range    LV EDV A2C 106 mL    LV EDV A4C 94 mL    LV ESV A2C 65 mL    LV ESV A4C 53 mL    IVSd 1.3 (A) 0.6 - 1.0 cm    LVIDd 4.7 4.2 - 5.9 cm    LVIDs 3.2 cm    LVOT Diameter 2.1 cm    LVOT Mean Gradient 2 mmHg    LVOT VTI 17.4 cm    LVOT Peak Velocity 0.9 m/s    LVOT Peak Gradient 3 mmHg    LVPWd 1.4 (A) 0.6 - 1.0 cm    LV E' Lateral Velocity 9.14 cm/s    LV E' Septal Velocity 6.20 cm/s    LV Ejection Fraction A2C 39 %    LV Ejection Fraction A4C 44 %    EF BP 43 (A) 55 - 100 %    LVOT Area 3.5 cm2    LVOT SV 60.2 ml    LA Minor Axis 4.5 cm    LA Major Axis 5.8 cm    LA Area 2C 13.4 cm2    LA Area 4C 16.8 cm2    LA Volume MOD A2C 33 18 - 58 mL    LA Volume MOD A4C 39 18 - 58 mL    LA Diameter 3.6 cm    AV Peak Velocity 1.4 m/s    AV Peak Gradient 8 mmHg    AV Peak Gradient 8 mmHg    AV Mean Velocity 1.0 m/s    AV Mean Gradient 5 mmHg    AV VTI 30.0 cm    AV Area by VTI 2.0 cm2    AV Area by Peak Velocity 2.2 cm2    Aortic Root 3.2 cm    Ascending Aorta 3.3 cm    IVC Proxmal 1.4 cm    MV E Wave Deceleration Time 197.0 ms    MV A Velocity 0.73 m/s    MV E Velocity  0.77 m/s    PV AT 112.0 ms    PV Max Velocity 0.8 m/s    PV Peak Gradient 2 mmHg    Est. RA Pressure 3 mmHg    RV Free Wall Peak S' 15.2 cm/s    Body Surface Area 2.06 m2    Fractional Shortening 2D 32 28 - 44 %    LV ESV Index A4C 26 mL/m2    LV EDV Index A4C 46 mL/m2    LV ESV Index A2C 32 mL/m2    LV EDV Index A2C 52 mL/m2    LVIDd Index 2.30 cm/m2    LVIDs Index 1.57 cm/m2    LV RWT Ratio 0.60     LV Mass 2D 251.4 (A) 88 - 224 g    LV Mass 2D Index 123.2 (A) 49 - 115 g/m2    MV  E/A 1.05     E/E' Ratio (Averaged) 10.42     E/E' Lateral 8.42     E/E' Septal 12.42     LVOT Stroke Volume Index 29.5 mL/m2    LA Volume Index MOD A2C 16 16 - 34 ml/m2    LA Volume Index MOD A4C 19 16 - 34 ml/m2    LA Size Index 1.76 cm/m2    LA/AO Root Ratio 1.13     Ao Root Index 1.57 cm/m2    Ascending Aorta Index 1.62 cm/m2    AV Velocity Ratio 0.64     LVOT:AV VTI Index 0.58     AVA/BSA VTI 1.0 cm2/m2    AVA/BSA Peak Velocity 1.1 cm2/m2    EF Physician 50 %   POCT Glucose    Collection Time: 04/02/24  3:57 PM   Result Value Ref Range    POC Glucose 124 (H) 65 - 100 mg/dL    Performed by: BlackAlexisPCA    POCT Glucose    Collection Time: 04/02/24  8:31 PM   Result Value Ref Range    POC Glucose 159 (H) 65 - 100 mg/dL    Performed by: BryantBrandyPCT    CBC    Collection Time: 04/03/24  3:07 AM   Result Value Ref Range    WBC 5.2 4.3 - 11.1 K/uL    RBC 3.80 (L) 4.23 - 5.6 M/uL    Hemoglobin 11.9 (L) 13.6 - 17.2 g/dL    Hematocrit 65.5 (L) 41.1 - 50.3 %    MCV 90.5 82 - 102 FL    MCH 31.3 26.1 - 32.9 PG    MCHC 34.6 31.4 - 35.0 g/dL    RDW 87.2 88.0 - 85.3 %    Platelets 186 150 - 450 K/uL    MPV 8.9 (L) 9.4 - 12.3 FL    nRBC 0.00 0.0 - 0.2 K/uL   Basic Metabolic Panel    Collection Time: 04/03/24  3:07 AM   Result Value Ref Range    Sodium 134 (L) 136 - 145 mmol/L    Potassium 4.5 3.5 - 5.1 mmol/L    Chloride 103 98 - 107 mmol/L    CO2 22 20 - 29 mmol/L    Anion Gap 10 7 - 16 mmol/L    Glucose 115 (H) 70 - 99 mg/dL    BUN 17 6 - 23  MG/DL    Creatinine 8.86 9.19 - 1.30 MG/DL    Est, Glom Filt Rate 76 >60 ml/min/1.59m2    Calcium  8.2 (L) 8.8 - 10.2 MG/DL   POCT Glucose    Collection Time: 04/03/24  8:14 AM   Result Value Ref Range    POC Glucose 121 (H) 65 - 100 mg/dL    Performed by: BlackAlexisPCA        No results for input(s): COVID19 in the last 72 hours.    Recent Vital Data:  Patient Vitals for the past 24 hrs:   Temp Pulse Resp BP SpO2   04/03/24 0812 98.4 F (36.9 C) 78 17 125/77 97 %   04/03/24 0407 98.1 F (36.7 C) 79 18 111/69 99 %   04/03/24 0055 98.4 F (36.9 C) 86 18 130/78 100 %   04/03/24 0050 -- 96 -- -- --   04/02/24 2109 98.4 F (36.9 C) 86 18 125/84 100 %   04/02/24 2030 -- 83 -- -- --   04/02/24 2028 -- -- -- 135/84 --   04/02/24 1830 -- --  16 -- --   04/02/24 1800 -- -- 16 -- --   04/02/24 1556 98.1 F (36.7 C) 76 16 130/79 100 %   04/02/24 1300 -- 74 -- 129/80 --   04/02/24 1230 -- 78 -- 125/80 --   04/02/24 1200 -- 78 -- 123/70 --   04/02/24 1147 -- -- 16 -- --   04/02/24 1145 -- 82 -- 118/78 --   04/02/24 1141 98.1 F (36.7 C) 77 16 (!) 140/84 99 %   04/02/24 1117 -- -- 16 -- --   04/02/24 1020 97.2 F (36.2 C) 72 12 127/76 99 %   04/02/24 1000 -- 71 12 131/79 95 %       Oxygen Therapy  SpO2: 97 %  Pulse via Oximetry: 71 beats per minute  O2 Device: None (Room air)  O2 Flow Rate (L/min): 3 L/min    Estimated body mass index is 28.57 kg/m as calculated from the following:    Height as of this encounter: 1.753 m (5' 9).    Weight as of this encounter: 87.8 kg (193 lb 8 oz).    Intake/Output Summary (Last 24 hours) at 04/03/2024 0950  Last data filed at 04/02/2024 2030  Gross per 24 hour   Intake 1186.44 ml   Output 600 ml   Net 586.44 ml         Physical Exam:    General:    Well nourished.  No overt distress  CV:   RRR.  No m/r/g.  No JVD, no edema   Lungs:   CTAB.  No wheezing, rhonchi, or rales.  Respirations even, unlabored  Abdomen:   Soft, nontender, nondistended.    Extremities: Warm and dry.   No edema.     Skin:     No rashes.  Normal coloration  Neuro:  grossly intact.  Psych:  Mood and affect appropriate          Time spent in patient discharge and coordination 35  minutes.      Signed:  Dorian Hals, MD    Part of this note may have been written by using a voice dictation software.  The note has been proof read but may still contain some grammatical/other typographical errors.  "

## 2024-04-03 NOTE — Care Coordination (Signed)
"  Discharge order is in. Pt is discharging back to his dtrs home today in stable condition. Pt indicated that he may want to participate in the CRP. He lives in [place], [place] and stated that he will pursue a local CRP when he returns. No other discharge needs identified by CM, tx goals met.     04/03/24 1012   Discharge Planning   Potential Assistance Obtaining Medications No   Patient expects to be discharged to: Home   Bathroom Accessibility Accessible   Services At/After Discharge   [facility] Information Provided? Yes   Who will provide transportation at discharge? Family   Condition of Participation: Discharge Planning   The Patient and/or Patient Representative was provided with a Choice of Provider? Patient   The Patient and/Or Patient Representative agree with the Discharge Plan? Yes         "

## 2024-04-06 NOTE — Telephone Encounter (Signed)
"  Care Transitions Initial Follow Up Call    Call within 2 business days of discharge: Yes     Patient: [name] [name] Patient DOB: 06-18-1966 MRN: [id number]    @DISCTABLE @    RARS: Readmission Risk Score: 6.6       Spoke with: Pt    Discharge department/facility: Home    Non-face-to-face services provided:     Home  Diet: ADULT DIET; Regular  Code Status: Full Code  Follow Up  Future Appointments   Date Time Provider Department Center   04/08/2024  1:15 PM Gerard Elsie KANDICE DOUGLAS, MD [facility]     Discharge instructions reviewed with pt, pt voiced understanding.   Charlies CHRISTELLA Llanos, LPN   "

## 2024-04-08 ENCOUNTER — Telehealth

## 2024-04-08 ENCOUNTER — Ambulatory Visit: Admit: 2024-04-08 | Discharge: 2024-04-08 | Payer: 59

## 2024-04-08 NOTE — Telephone Encounter (Signed)
"  Pt asking if Dr. Gerard wants him to do cardiac rehab. Pt would like to do rehab.  "

## 2024-04-08 NOTE — Telephone Encounter (Signed)
"  Called and advised patient he should do cardiac rehab. Looked and original referral is closed new referral placed.      "

## 2024-04-08 NOTE — Progress Notes (Signed)
 "          [facility]  [address], [address]  [place], [place] [address]  PHONE: [phone]      04/08/24    NAME:  [name] [name]  DOB: 10/20/1965  MRN: [id number]         SUBJECTIVE:   [name] [name] is a 58 y.o. male seen for a follow up visit regarding the following:     Chief Complaint   Patient presents with    Hypertension     Transitional care            HPI:  Follow up  Hypertension (Transitional care)   .    [name] [name] presents today for hospital follow-up.  Was admitted with chest pain.  Found to have elevated troponin.  Underwent left heart catheterization for non-STEMI and found to have an acute mid LAD lesion treated with drug-eluting stent.  Had moderate disease elsewhere and a distal RCA CTO.  Echo showed low normal ejection fraction of 50 to 55%.  No significant valve disease.  Since discharge, has been doing fairly well.  He denies chest pain, shortness of breath, orthopnea, PND, palpitations, syncope or lower extremity swelling.    Hypertension  Pertinent negatives include no shortness of breath.           Cardiac Medications       Nitrates       nitroGLYCERIN  (NITROSTAT ) 0.4 MG SL tablet Place 1 tablet under the tongue every 5 minutes as needed for Chest pain up to max of 3 total doses. If no relief after 1 dose, call 911.       Beta Blockers Cardio-Selective       metoprolol  tartrate (LOPRESSOR ) 25 MG tablet Take 1 tablet by mouth 2 times daily     Patient not taking: Reported on 04/08/2024       HMG CoA Reductase Inhibitors       atorvastatin  (LIPITOR ) 40 MG tablet Take 1 tablet by mouth nightly       Angiotensin II Receptor Antagonists       valsartan  (DIOVAN ) 40 MG tablet Take 1 tablet by mouth every 12 hours       Salicylates       aspirin  81 MG EC tablet Take 1 tablet by mouth daily       Platelet Aggregation Inhibitors       ticagrelor  (BRILINTA ) 90 MG TABS tablet Take 1 tablet by mouth 2 times daily                  Past Medical History, Past Surgical History, Family history,  Social History, and Medications were all reviewed with the patient today and updated as necessary.     Prior to Admission medications   Medication Sig Start Date End Date Taking? Authorizing Provider   aspirin  81 MG EC tablet Take 1 tablet by mouth daily 04/03/24  Yes Davis-Pachter, Dorian, MD   nitroGLYCERIN  (NITROSTAT ) 0.4 MG SL tablet Place 1 tablet under the tongue every 5 minutes as needed for Chest pain up to max of 3 total doses. If no relief after 1 dose, call 911. 04/03/24  Yes Davis-Pachter, Dorian, MD   atorvastatin  (LIPITOR ) 40 MG tablet Take 1 tablet by mouth nightly 04/03/24  Yes Davis-Pachter, Dorian, MD   valsartan  (DIOVAN ) 40 MG tablet Take 1 tablet by mouth every 12 hours 04/03/24  Yes Davis-Pachter, Dorian, MD   ticagrelor  (BRILINTA ) 90 MG TABS tablet Take 1  tablet by mouth 2 times daily 04/03/24  Yes Davis-Pachter, Dorian, MD   omeprazole (PRILOSEC) 40 MG delayed release capsule Take 1 capsule by mouth 09/30/20  Yes [provider]   oxyCODONE -acetaminophen  (PERCOCET ) 10-325 MG per tablet Take by mouth. 11/25/20  Yes [provider]   gabapentin  (NEURONTIN ) 400 MG capsule Take 2 capsules by mouth. 09/21/20  Yes [provider]   metoprolol  tartrate (LOPRESSOR ) 25 MG tablet Take 1 tablet by mouth 2 times daily  Patient not taking: Reported on 04/08/2024 04/03/24   Jerrye Dorian, MD     Allergies   Allergen Reactions    Lisinopril Hives     Past Medical History:   Diagnosis Date    Diabetes mellitus (HCC)     Hypertension      Past Surgical History:   Procedure Laterality Date    CARDIAC PROCEDURE N/A 04/02/2024    Left heart cath / coronary angiography performed by Landy Toribio SAUNDERS, MD at [facility] CARDIAC CATH LAB    CARDIAC PROCEDURE N/A 04/02/2024    Percutaneous coronary intervention performed by Landy Toribio SAUNDERS, MD at [facility] CARDIAC CATH LAB    CARDIAC PROCEDURE N/A 04/02/2024    Intravascular ultrasound performed by Landy Toribio SAUNDERS, MD at [facility] CARDIAC CATH LAB     No family history on  file.  Social History     Tobacco Use    Smoking status: Some Days     Types: Cigars     Start date: 2024    Smokeless tobacco: Never    Tobacco comments:     Former cigarette smoker (15 years), currently smokes 6-7 cigars a day    Substance Use Topics    Alcohol use: Not on file       ROS:    Review of Systems   Constitutional: Negative for chills, diaphoresis and fever.   HENT:  Negative for hearing loss.    Eyes:  Negative for visual disturbance.   Cardiovascular:         As per the HPI   Respiratory:  Negative for shortness of breath.    Hematologic/Lymphatic: Does not bruise/bleed easily.   Gastrointestinal:  Negative for abdominal pain.   Genitourinary:  Negative for dysuria.   Neurological:  Negative for focal weakness.   Psychiatric/Behavioral:  Negative for suicidal ideas.           PHYSICAL EXAM:   BP 136/78   Pulse 95   Ht 1.753 m (5' 9)   Wt 89.4 kg (197 lb 3.2 oz)   BMI 29.12 kg/m      Wt Readings from Last 3 Encounters:   04/08/24 89.4 kg (197 lb 3.2 oz)   04/03/24 87.8 kg (193 lb 8 oz)   08/05/23 87.5 kg (193 lb)     BP Readings from Last 3 Encounters:   04/08/24 136/78   04/03/24 125/77   08/06/23 (!) 167/90     Pulse Readings from Last 3 Encounters:   04/08/24 95   04/03/24 78   08/06/23 85           Physical Exam  Vitals reviewed.   Constitutional:       General: He is not in acute distress.     Appearance: Normal appearance.   HENT:      Head: Normocephalic and atraumatic.   Eyes:      General: No scleral icterus.  Neck:      Vascular: No carotid bruit.   Cardiovascular:  Rate and Rhythm: Normal rate and regular rhythm.      Heart sounds: No murmur heard.  Pulmonary:      Breath sounds: Normal breath sounds.   Abdominal:      General: Abdomen is flat.      Palpations: Abdomen is soft.   Musculoskeletal:         General: No swelling.      Cervical back: Neck supple.   Skin:     General: Skin is warm and dry.   Neurological:      Mental Status: He is alert and oriented to person, place,  and time.   Psychiatric:         Mood and Affect: Mood normal.         Medical problems and test results were reviewed with the patient today.     DATA REVIEW    LIPID PANEL     No results found for: CHOL  No results found for: TRIG  No results found for: HDL  No components found for: LDLCHOLESTEROL, LDLCALC  No results found for: VLDL  No results found for: CHOLHDLRATIO    BMP  Lab Results   Component Value Date/Time    NA 134 04/03/2024 03:07 AM    K 4.5 04/03/2024 03:07 AM    CL 103 04/03/2024 03:07 AM    CO2 22 04/03/2024 03:07 AM    BUN 17 04/03/2024 03:07 AM    CREATININE 1.13 04/03/2024 03:07 AM    GLUCOSE 115 04/03/2024 03:07 AM    CALCIUM  8.2 04/03/2024 03:07 AM          EKG    Sinus rhythm        OUTSIDE RECORDS REVIEW    Records from outside providers have been reviewed and summarized as noted in the HPI, past history and data review sections of this note, and reviewed with patient. .       ASSESSMENT and PLAN    Zimere was seen today for hypertension.    Diagnoses and all orders for this visit:    Atherosclerosis of native coronary artery of native heart without angina pectoris    Non-STEMI (non-ST elevated myocardial infarction) (HCC)    Benign essential HTN          IMPRESSION:    Mr. Basaldua presents today for hospital follow-up.  Underwent stenting of the mid LAD.  Had moderate disease elsewhere.  Current medications include aspirin , Brilinta , atorvastatin  and losartan.  Has no heart failure symptoms by exam or history today.  We discussed importance of his dual antiplatelet therapy.  Will see him back in 2 months.        No follow-ups on file.    Personal time spent with chart review, interview/physical examination, documentation and coordination of care totals 37 minutes.       Thank you for allowing me to participate in this patient's care.  Please call or contact me if there are any questions or concerns regarding the above.      Elsie KANDICE Pringle III, MD  04/08/24  2:04 PM    "

## 2024-04-14 NOTE — Telephone Encounter (Signed)
"  Lvm for return call.   "

## 2024-04-14 NOTE — Telephone Encounter (Signed)
"  Pt is needing work excuse for 10/8 appt w/ Dr Gerard. Please call when ready to pick up.   "

## 2024-04-15 ENCOUNTER — Encounter (HOSPITAL_BASED_OUTPATIENT_CLINIC_OR_DEPARTMENT_OTHER): Attending: Internal Medicine | Admitting: Internal Medicine

## 2024-04-15 DIAGNOSIS — E1140 Type 2 diabetes mellitus with diabetic neuropathy, unspecified: Secondary | ICD-10-CM | POA: Diagnosis not present

## 2024-04-15 DIAGNOSIS — L97522 Non-pressure chronic ulcer of other part of left foot with fat layer exposed: Secondary | ICD-10-CM | POA: Diagnosis not present

## 2024-04-15 DIAGNOSIS — Z8619 Personal history of other infectious and parasitic diseases: Secondary | ICD-10-CM | POA: Diagnosis not present

## 2024-04-15 DIAGNOSIS — I10 Essential (primary) hypertension: Secondary | ICD-10-CM | POA: Diagnosis not present

## 2024-04-15 DIAGNOSIS — E11621 Type 2 diabetes mellitus with foot ulcer: Secondary | ICD-10-CM | POA: Insufficient documentation

## 2024-04-15 NOTE — Telephone Encounter (Signed)
"  Lvm for return call.   "

## 2024-04-15 NOTE — Telephone Encounter (Signed)
 Lvm for return call

## 2024-04-17 ENCOUNTER — Inpatient Hospital Stay (HOSPITAL_BASED_OUTPATIENT_CLINIC_OR_DEPARTMENT_OTHER)
Admission: EM | Admit: 2024-04-17 | Discharge: 2024-04-19 | DRG: 291 | Disposition: A | Discharge status: Home / Self Care | Attending: Internal Medicine | Admitting: Internal Medicine

## 2024-04-17 ENCOUNTER — Emergency Department (HOSPITAL_BASED_OUTPATIENT_CLINIC_OR_DEPARTMENT_OTHER)

## 2024-04-17 ENCOUNTER — Other Ambulatory Visit: Payer: Self-pay

## 2024-04-17 ENCOUNTER — Encounter (HOSPITAL_BASED_OUTPATIENT_CLINIC_OR_DEPARTMENT_OTHER): Payer: Self-pay

## 2024-04-17 DIAGNOSIS — L97529 Non-pressure chronic ulcer of other part of left foot with unspecified severity: Secondary | ICD-10-CM | POA: Diagnosis present

## 2024-04-17 DIAGNOSIS — I5033 Acute on chronic diastolic (congestive) heart failure: Secondary | ICD-10-CM | POA: Diagnosis present

## 2024-04-17 DIAGNOSIS — I252 Old myocardial infarction: Secondary | ICD-10-CM | POA: Diagnosis not present

## 2024-04-17 DIAGNOSIS — Z7984 Long term (current) use of oral hypoglycemic drugs: Secondary | ICD-10-CM

## 2024-04-17 DIAGNOSIS — Z7982 Long term (current) use of aspirin: Secondary | ICD-10-CM

## 2024-04-17 DIAGNOSIS — F17290 Nicotine dependence, other tobacco product, uncomplicated: Secondary | ICD-10-CM | POA: Diagnosis present

## 2024-04-17 DIAGNOSIS — Z888 Allergy status to other drugs, medicaments and biological substances status: Secondary | ICD-10-CM | POA: Diagnosis not present

## 2024-04-17 DIAGNOSIS — Z7985 Long-term (current) use of injectable non-insulin antidiabetic drugs: Secondary | ICD-10-CM | POA: Diagnosis not present

## 2024-04-17 DIAGNOSIS — I080 Rheumatic disorders of both mitral and aortic valves: Secondary | ICD-10-CM | POA: Diagnosis present

## 2024-04-17 DIAGNOSIS — Z7902 Long term (current) use of antithrombotics/antiplatelets: Secondary | ICD-10-CM

## 2024-04-17 DIAGNOSIS — E119 Type 2 diabetes mellitus without complications: Secondary | ICD-10-CM

## 2024-04-17 DIAGNOSIS — E7849 Other hyperlipidemia: Secondary | ICD-10-CM | POA: Diagnosis present

## 2024-04-17 DIAGNOSIS — Z955 Presence of coronary angioplasty implant and graft: Secondary | ICD-10-CM | POA: Diagnosis not present

## 2024-04-17 DIAGNOSIS — Z89411 Acquired absence of right great toe: Secondary | ICD-10-CM

## 2024-04-17 DIAGNOSIS — I5031 Acute diastolic (congestive) heart failure: Secondary | ICD-10-CM | POA: Diagnosis not present

## 2024-04-17 DIAGNOSIS — E11621 Type 2 diabetes mellitus with foot ulcer: Secondary | ICD-10-CM | POA: Diagnosis present

## 2024-04-17 DIAGNOSIS — I160 Hypertensive urgency: Secondary | ICD-10-CM | POA: Diagnosis present

## 2024-04-17 DIAGNOSIS — Z8249 Family history of ischemic heart disease and other diseases of the circulatory system: Secondary | ICD-10-CM | POA: Diagnosis not present

## 2024-04-17 DIAGNOSIS — D649 Anemia, unspecified: Secondary | ICD-10-CM | POA: Diagnosis present

## 2024-04-17 DIAGNOSIS — K219 Gastro-esophageal reflux disease without esophagitis: Secondary | ICD-10-CM | POA: Diagnosis present

## 2024-04-17 DIAGNOSIS — I110 Hypertensive heart disease with heart failure: Principal | ICD-10-CM | POA: Diagnosis present

## 2024-04-17 DIAGNOSIS — E1169 Type 2 diabetes mellitus with other specified complication: Secondary | ICD-10-CM | POA: Diagnosis present

## 2024-04-17 DIAGNOSIS — Z1152 Encounter for screening for COVID-19: Secondary | ICD-10-CM

## 2024-04-17 DIAGNOSIS — Z9861 Coronary angioplasty status: Secondary | ICD-10-CM | POA: Diagnosis not present

## 2024-04-17 DIAGNOSIS — I2510 Atherosclerotic heart disease of native coronary artery without angina pectoris: Secondary | ICD-10-CM | POA: Diagnosis present

## 2024-04-17 DIAGNOSIS — I509 Heart failure, unspecified: Secondary | ICD-10-CM | POA: Diagnosis present

## 2024-04-17 DIAGNOSIS — I440 Atrioventricular block, first degree: Secondary | ICD-10-CM | POA: Diagnosis present

## 2024-04-17 DIAGNOSIS — I159 Secondary hypertension, unspecified: Secondary | ICD-10-CM | POA: Diagnosis not present

## 2024-04-17 DIAGNOSIS — Z716 Tobacco abuse counseling: Secondary | ICD-10-CM

## 2024-04-17 DIAGNOSIS — E785 Hyperlipidemia, unspecified: Secondary | ICD-10-CM | POA: Diagnosis not present

## 2024-04-17 DIAGNOSIS — L97509 Non-pressure chronic ulcer of other part of unspecified foot with unspecified severity: Secondary | ICD-10-CM

## 2024-04-17 DIAGNOSIS — Z79899 Other long term (current) drug therapy: Secondary | ICD-10-CM | POA: Diagnosis not present

## 2024-04-17 DIAGNOSIS — G8929 Other chronic pain: Secondary | ICD-10-CM | POA: Diagnosis present

## 2024-04-17 DIAGNOSIS — F4310 Post-traumatic stress disorder, unspecified: Secondary | ICD-10-CM | POA: Diagnosis present

## 2024-04-17 DIAGNOSIS — I10 Essential (primary) hypertension: Secondary | ICD-10-CM

## 2024-04-17 LAB — PRO BRAIN NATRIURETIC PEPTIDE: Pro Brain Natriuretic Peptide: 2144 pg/mL — ABNORMAL HIGH (ref <300.0)

## 2024-04-17 LAB — GLUCOSE, CAPILLARY: Glucose-Capillary: 127 mg/dL — ABNORMAL HIGH (ref 70–99)

## 2024-04-17 LAB — CBC WITH DIFFERENTIAL/PLATELET
Abs Immature Granulocytes: 0.01 K/uL (ref 0.00–0.07)
Basophils Absolute: 0.1 K/uL (ref 0.0–0.1)
Basophils Relative: 1 %
Eosinophils Absolute: 0.2 K/uL (ref 0.0–0.5)
Eosinophils Relative: 5 %
HCT: 36.3 % — ABNORMAL LOW (ref 39.0–52.0)
Hemoglobin: 12.4 g/dL — ABNORMAL LOW (ref 13.0–17.0)
Immature Granulocytes: 0 %
Lymphocytes Relative: 41 %
Lymphs Abs: 2.1 K/uL (ref 0.7–4.0)
MCH: 31.5 pg (ref 26.0–34.0)
MCHC: 34.2 g/dL (ref 30.0–36.0)
MCV: 92.1 fL (ref 80.0–100.0)
Monocytes Absolute: 0.4 K/uL (ref 0.1–1.0)
Monocytes Relative: 8 %
Neutro Abs: 2.3 K/uL (ref 1.7–7.7)
Neutrophils Relative %: 45 %
Platelets: 254 K/uL (ref 150–400)
RBC: 3.94 MIL/uL — ABNORMAL LOW (ref 4.22–5.81)
RDW: 13.2 % (ref 11.5–15.5)
WBC: 5.2 K/uL (ref 4.0–10.5)
nRBC: 0 % (ref 0.0–0.2)

## 2024-04-17 LAB — BASIC METABOLIC PANEL WITH GFR
Anion gap: 11 (ref 5–15)
BUN: 9 mg/dL (ref 6–20)
CO2: 22 mmol/L (ref 22–32)
Calcium: 8.4 mg/dL — ABNORMAL LOW (ref 8.9–10.3)
Chloride: 102 mmol/L (ref 98–111)
Creatinine, Ser: 1.09 mg/dL (ref 0.61–1.24)
GFR, Estimated: >60 mL/min (ref >60)
Glucose, Bld: 133 mg/dL — ABNORMAL HIGH (ref 70–99)
Potassium: 4 mmol/L (ref 3.5–5.1)
Sodium: 135 mmol/L (ref 135–145)

## 2024-04-17 LAB — RESP PANEL BY RT-PCR (RSV, FLU A&B, COVID)  RVPGX2
Influenza A by PCR: NEGATIVE
Influenza B by PCR: NEGATIVE
Resp Syncytial Virus by PCR: NEGATIVE
SARS Coronavirus 2 by RT PCR: NEGATIVE

## 2024-04-17 LAB — TROPONIN T, HIGH SENSITIVITY
Troponin T High Sensitivity: 99 ng/L (ref 0–19)
Troponin T High Sensitivity: 96 ng/L — ABNORMAL HIGH (ref 0–19)

## 2024-04-17 MED ORDER — AMLODIPINE BESYLATE 5 MG PO TABS
5.0000 mg | ORAL_TABLET | Freq: Every day | ORAL | Status: DC
  Administered 2024-04-18 – 2024-04-19 (×2): 5 mg via ORAL
  Filled 2024-04-17 (×2): qty 1

## 2024-04-17 MED ORDER — FUROSEMIDE 10 MG/ML IJ SOLN
40.0000 mg | Freq: Once | INTRAMUSCULAR | Status: AC
  Administered 2024-04-17: 40 mg via INTRAVENOUS
  Filled 2024-04-17: qty 4

## 2024-04-17 MED ORDER — ASPIRIN 81 MG PO TBEC
81.0000 mg | DELAYED_RELEASE_TABLET | Freq: Every day | ORAL | Status: DC
Start: 2024-04-18 — End: 2024-04-19
  Administered 2024-04-18 – 2024-04-19 (×2): 81 mg via ORAL
  Filled 2024-04-17 (×2): qty 1

## 2024-04-17 MED ORDER — IRBESARTAN 75 MG PO TABS
75.0000 mg | ORAL_TABLET | Freq: Every day | ORAL | Status: DC
  Administered 2024-04-18 – 2024-04-19 (×2): 75 mg via ORAL
  Filled 2024-04-17 (×2): qty 1

## 2024-04-17 MED ORDER — ENOXAPARIN SODIUM 40 MG/0.4ML IJ SOSY
40.0000 mg | PREFILLED_SYRINGE | Freq: Every day | INTRAMUSCULAR | Status: DC
  Administered 2024-04-17 – 2024-04-18 (×2): 40 mg via SUBCUTANEOUS
  Filled 2024-04-17 (×2): qty 0.4

## 2024-04-17 MED ORDER — ATORVASTATIN CALCIUM 40 MG PO TABS
40.0000 mg | ORAL_TABLET | Freq: Every day | ORAL | Status: DC
  Administered 2024-04-17 – 2024-04-18 (×2): 40 mg via ORAL
  Filled 2024-04-17 (×2): qty 1

## 2024-04-17 MED ORDER — TICAGRELOR 90 MG PO TABS
90.0000 mg | ORAL_TABLET | Freq: Two times a day (BID) | ORAL | Status: DC
Start: 2024-04-17 — End: 2024-04-19
  Administered 2024-04-17 – 2024-04-19 (×4): 90 mg via ORAL
  Filled 2024-04-17 (×4): qty 1

## 2024-04-17 MED ORDER — IOHEXOL 350 MG/ML SOLN
100.0000 mL | Freq: Once | INTRAVENOUS | Status: AC | PRN
  Administered 2024-04-17: 100 mL via INTRAVENOUS

## 2024-04-17 MED ORDER — INSULIN ASPART 100 UNIT/ML IJ SOLN: 0.0000 [IU] | Freq: Every day | INTRAMUSCULAR | Status: DC

## 2024-04-17 MED ORDER — ASPIRIN 81 MG PO CHEW
324.0000 mg | CHEWABLE_TABLET | Freq: Once | ORAL | Status: AC
  Administered 2024-04-17: 324 mg via ORAL
  Filled 2024-04-17: qty 4

## 2024-04-17 MED ORDER — INSULIN ASPART 100 UNIT/ML IJ SOLN: 0.0000 [IU] | Freq: Three times a day (TID) | INTRAMUSCULAR | Status: DC

## 2024-04-17 MED ORDER — PANTOPRAZOLE SODIUM 40 MG PO TBEC
40.0000 mg | DELAYED_RELEASE_TABLET | Freq: Every day | ORAL | Status: DC
  Administered 2024-04-18 – 2024-04-19 (×2): 40 mg via ORAL
  Filled 2024-04-17 (×2): qty 1

## 2024-04-17 MED ORDER — DOXYCYCLINE HYCLATE 100 MG PO TABS
100.0000 mg | ORAL_TABLET | Freq: Two times a day (BID) | ORAL | Status: DC
Start: 2024-04-17 — End: 2024-04-19
  Administered 2024-04-17 – 2024-04-19 (×4): 100 mg via ORAL
  Filled 2024-04-17 (×4): qty 1

## 2024-04-17 MED ORDER — CARVEDILOL 12.5 MG PO TABS
12.5000 mg | ORAL_TABLET | Freq: Two times a day (BID) | ORAL | Status: DC
  Administered 2024-04-18 – 2024-04-19 (×3): 12.5 mg via ORAL
  Filled 2024-04-17 (×3): qty 1

## 2024-04-17 NOTE — Consult Note (Signed)
 Cardiology Consultation   Patient ID: [name] MRN: [id number]; DOB: [birth year]  Admit date: 04/17/2024 Date of Consult: 04/17/2024  PCP:  Clinic, [facility] Providers Cardiologist:  None        Patient Profile: [name] is a 58 y.o. male with a hx of recent NSTEMI who is being seen 04/17/2024 for the evaluation of SOB at the request of hospitalist service.  History of Present Illness: [name] presents today with SOB and chest discomfort. Recently admitted for NSTEMI with PCI to mid LAD, reportedly preserved EF on echo during that hospitalization with moderate to severe AI.  A few days after PCI he started noticing a little twinge here and there of chest pain but generally doing well.  Three days ago he started feeling SOB and noted L leg swelling as well.  No presyncope or palpitations.  On admission, hypertensive to 180s systolic.  Troponins flat in the 90s, proBNP elevated at 2100.  Chest imaging with pulmonary edema and bilateral pleural effusions.  Already diuresed 2L in the ED with IV lasix .  Transferred to [facility] for further management.  Feeling much better after diuresis already  Notably, prior to his last admission he was on amlodipine and spironolactone for BP control.   Past Medical History:  Diagnosis Date   Arthritis    Diabetes mellitus    Fistula, perirectal    GERD (gastroesophageal reflux disease)    Hypertension    Osteomyelitis of great toe of right foot (HCC) 10/31/2021   Polymicrobial bacterial infection 10/31/2021   PTSD (post-traumatic stress disorder)    PTSD (post-traumatic stress disorder) 10/31/2021   Small bowel obstruction (HCC)     Past Surgical History:  Procedure Laterality Date   RECTAL SURGERY       Medications: reconciled  Allergies:    Allergies  Allergen Reactions   Lisinopril Other (See Comments) and Hives    Other reaction(s): Electrocardiogram abnormal  Other Reaction(s):  Electrocardiogram abnormal, Abdominal pain, Electrocardiogram abnormal, Abdominal pain    Social History:   Social History   Socioeconomic History   Marital status: Married    Spouse name: Not on file   Number of children: Not on file   Years of education: Not on file   Highest education level: Not on file  Occupational History   Not on file  Tobacco Use   Smoking status: Some Days    Types: Cigars   Smokeless tobacco: Never   Tobacco comments:    Occasional cigar  Vaping Use   Vaping status: Never Used  Substance and Sexual Activity   Alcohol use: Yes    Comment: occ   Drug use: No   Sexual activity: Yes  Other Topics Concern   Not on file  Social History Narrative   Not on file   Social Drivers of Health   Financial Resource Strain: Not At Risk (04/01/2024)   Received from [facility] - Financial Strain    How hard is it for you to pay for the very basics like food, housing, medical care, and heating? Would you say it is:: Not hard at all  Food Insecurity: No Food Insecurity (04/01/2024)   Received from [facility]   Hunger Vital Sign    Within the past 12 months, you worried that your food would run out before you got the money to buy more.: Never true    Within the past 12 months,  the food you bought just didn't last and you didn't have money to get more.: Never true  Transportation Needs: No Transportation Needs (04/01/2024)   Received from [facility]   PRAPARE - Transportation    In the past 12 months, has lack of transportation kept you from medical appointments or from getting medications?: No    In the past 12 months, has lack of transportation kept you from meetings, work, or from getting things needed for daily living?: No  Physical Activity: Inactive (04/01/2024)   Received from [facility]   Physical Activity    Days of Exercise per Week: 0 days    On those days that you engage in  moderate to vigorous physical activity, how many minutes, on average, do you exercise?: 0    Total Minutes of Exercise Per Week: 0  Stress: Not on file  Social Connections: Not At Risk (04/01/2024)   Received from [facility] - Social Connections    If for any reason you need help with day-to-day activities such as bathing, preparing meals, shopping, managing finances, etc., do you get the help you need?: I get all the help I need    How often do you feel lonely or isolated from those around you?: Never  Intimate Partner Violence: Unknown (10/17/2021)   Received from [facility]   Intimate Partner Violence    Fear of Current or Ex-Partner: Not asked    Emotionally Abused: Not asked    Physically Abused: Not asked    Sexually Abused: Not asked    Family History:   Family History  Problem Relation Age of Onset   Heart attack Father    Obesity Father      ROS:  Please see the history of present illness.  All other ROS reviewed and negative.     Physical Exam/Data: Vitals:   04/17/24 1645 04/17/24 1700 04/17/24 1730 04/17/24 2012  BP:  (!) 189/114 (!) 169/97 (!) 159/90  Pulse: 89 90  87  Resp: (!) 0 (!) 25 19 20   Temp:    98.2 F (36.8 C)  TempSrc:    Oral  SpO2: 96% 92%  96%  Weight:    90.3 kg  Height:    5' 9 (1.753 m)      04/17/2024    8:12 PM 08/15/2023    4:16 PM 04/10/2023   12:47 AM  Last 3 Weights  Weight (lbs) 199 lb 1.6 oz 193 lb 200 lb  Weight (kg) 90.311 kg 87.544 kg 90.719 kg     Body mass index is 29.4 kg/m.  General:  Well nourished, well developed, in no acute distress HEENT: normal Neck: mid neck JVD Vascular: Distal pulses 2+ bilaterally Cardiac:  normal S1, S2; RRR; no murmur  Lungs:  clear to auscultation bilaterally, no wheezing, rhonchi or rales  Abd: soft, nontender, bowel sounds present Ext: trace B edema Skin: warm and dry   EKG:  The EKG was personally reviewed and demonstrates:  sinus rhythm with inferior TWI different from  last ECG in our system from 08/2022   Laboratory Data: \Chemistry Recent Labs  Lab 04/17/24 1130  NA 135  K 4.0  CL 102  CO2 22  GLUCOSE 133*  BUN 9  CREATININE 1.09  CALCIUM  8.4*  GFRNONAA >60  ANIONGAP 11   Hematology Recent Labs  Lab 04/17/24 1130  WBC 5.2  RBC 3.94*  HGB 12.4*  HCT 36.3*  MCV 92.1  MCH  31.5  MCHC 34.2  RDW 13.2  PLT 254    BNP Recent Labs  Lab 04/17/24 1130  PROBNP 2,144.0*    DDimer No results for input(s): DDIMER in the last 168 hours.  Radiology/Studies:  CT Angio Chest PE W and/or Wo Contrast Result Date: 04/17/2024 CLINICAL DATA:  Chest pain and shortness of breath. Cough. Clinical concern for pulmonary embolus. EXAM: CT ANGIOGRAPHY CHEST WITH CONTRAST TECHNIQUE: Multidetector CT imaging of the chest was performed using the standard protocol during bolus administration of intravenous contrast. Multiplanar CT image reconstructions and MIPs were obtained to evaluate the vascular anatomy. RADIATION DOSE REDUCTION: This exam was performed according to the departmental dose-optimization program which includes automated exposure control, adjustment of the mA and/or kV according to patient size and/or use of iterative reconstruction technique. CONTRAST:  OMNIPAQUE  IOHEXOL  350 MG/ML SOLN COMPARISON:  CT chest 04/23/2022 FINDINGS: Cardiovascular: The heart size is normal. No substantial pericardial effusion. Coronary artery calcification is evident. Mild atherosclerotic calcification is noted in the wall of the thoracic aorta. There is no filling defect within the opacified pulmonary arteries to suggest the presence of an acute pulmonary embolus. Mediastinum/Nodes: No mediastinal lymphadenopathy. There is no hilar lymphadenopathy. The esophagus has normal imaging features. There is no axillary lymphadenopathy. Lungs/Pleura: Interlobular septal thickening noted bilaterally, more pronounced in the lung bases. There is associated patchy and nodular  ground-glass opacity in the lower lungs bilaterally with small bilateral pleural effusions. 3 mm anterior right middle lobe nodule on 74/303 is stable since 04/23/2022. No followup imaging is recommended. Upper Abdomen: Reflux of contrast material into the IVC and hepatic veins suggests right heart dysfunction. Musculoskeletal: No worrisome lytic or sclerotic osseous abnormality. Review of the MIP images confirms the above findings. IMPRESSION: 1. No CT evidence for acute pulmonary embolus. 2. Interlobular septal thickening with associated patchy and nodular ground-glass opacity in the lower lungs bilaterally. Imaging features are most suggestive of pulmonary edema. 3. Small bilateral pleural effusions. 4. Reflux of contrast material into the IVC and hepatic veins suggests right heart dysfunction. 5.  Aortic Atherosclerosis (ICD10-I70.0). Electronically Signed   By: Camellia Candle M.D.   On: 04/17/2024 14:27   DG Chest Port 1 View Result Date: 04/17/2024 EXAM: 1 VIEW(S) XRAY OF THE CHEST 04/17/2024 12:02:00 PM COMPARISON: Comparison to 07/30/2022. CLINICAL HISTORY: cp. Central chest pain, SHOB, cough since last night. Denies N/V/D or fevers. States had stent placed earlier this month. Pt appears SHOB when speaking. FINDINGS: LUNGS AND PLEURA: Mild diffuse interstitial densities are noted bilaterally consistent with pulmonary edema. No focal pulmonary opacity. No pleural effusion. No pneumothorax. HEART AND MEDIASTINUM: Stable cardiomediastinal silhouette. BONES AND SOFT TISSUES: No acute osseous abnormality. IMPRESSION: 1. Mild interstitial pulmonary edema. Electronically signed by: Lynwood Seip MD 04/17/2024 12:31 PM EDT RP Workstation: [id number]     Assessment and Plan: Hypertensive urgency with likely acute diastolic heart failure in that setting.  Has already responded to IV lasix .  Renal function preserved.  Recommend: Target SBP <160, consider switching metoprolol to carvedilol for better BP  control, maintain the valsartan (transiently switched to irbesartan for [facility] formulary).  Might also resume the amlodipine that he was on prior to the last hospitalization for further BP control.  Diurese with IV lasix  BID.  Consider repeat echo tomorrow to evaluate systolic and diastolic function.  Continue ASA and ticagrelor for recently implanted LAD stent.    Risk Assessment/Risk Scores:       [place]  Heart Association (NYHA) Functional Class NYHA  Class III       For questions or updates, please contact [facility] Please consult [email or web address] for contact info under     Signed, Andee Flatten, MD  04/17/2024 9:32 PM

## 2024-04-17 NOTE — ED Notes (Signed)
 ED Provider at bedside.

## 2024-04-17 NOTE — ED Provider Notes (Signed)
 [facility] Provider Note  CSN: [id number] Arrival date & time: 04/17/24 1105  Chief Complaint(s) Chest Pain  HPI [name] is a 58 y.o. male with past medical history as below, significant for DM, GERD, PTSD, small bowel obstruction, NSTEMI who presents to the ED with complaint of chest pain dyspnea  Patient reports he was recently traveling in [place]  and unfortunately had a heart attack, he had LHC on 10/2 and [place] , received a stent.  He was discharged in stable condition.  Doing well since then up until yesterday when he began having difficulty breathing.  Patient reports that difficulty breathing began when he was walking from outside of his house to the inside.  Upon entering his house he felt like he was having trouble taking a deep breath.  He tightness midsternum nonradiating.  Symptoms seem to worsen when he laid down in his bed.  He put on a CPAP machine which provided some relief, he eventually rolled onto his abdomen which helped him go to sleep.  He still having difficulty breathing this morning and presented to the ER for evaluation.  Chest pain is still ongoing.  Not worsened.  Does not worsen with exertion.  No associated palpitations.  No syncope or near syncope.  No fevers or chills.  No leg swelling.  He has been compliant his medications since recent cardiac event.  He uses tobacco products.  Denies illicit drugs  Past Medical History Past Medical History:  Diagnosis Date   Arthritis    Diabetes mellitus    Fistula, perirectal    GERD (gastroesophageal reflux disease)    Hypertension    Osteomyelitis of great toe of right foot (HCC) 10/31/2021   Polymicrobial bacterial infection 10/31/2021   PTSD (post-traumatic stress disorder)    PTSD (post-traumatic stress disorder) 10/31/2021   Small bowel obstruction (HCC)    Patient Active Problem List   Diagnosis Date Noted   Chronic anemia 04/18/2024   Chronic pain 04/18/2024   Acute on  chronic diastolic heart failure (HCC) 04/17/2024   CAD S/P percutaneous coronary angioplasty 04/17/2024   Toe ulcer (HCC) 04/17/2024   Osteomyelitis of great toe of right foot (HCC) 10/31/2021   Tobacco dependence syndrome 10/31/2021   HTN (hypertension) 10/31/2021   Contact with and (suspected) exposure to other hazardous substances 10/31/2021   Corns and callosities 10/31/2021   Depression 10/31/2021   PTSD (post-traumatic stress disorder) 10/31/2021   Polymicrobial bacterial infection 10/31/2021   Chronic migraine without aura without status migrainosus, not intractable 06/20/2020   DDD (degenerative disc disease), lumbar 02/08/2016   Type 2 diabetes mellitus with hyperlipidemia (HCC) 05/29/2007   Home Medication(s) Prior to Admission medications   Medication Sig Start Date End Date Taking? Authorizing Provider  valsartan (DIOVAN) 160 MG tablet Take 1 tablet (160 mg total) by mouth daily. 04/20/24  Yes Arrien, Elidia Sieving, MD  aspirin  EC 81 MG tablet Take 1 tablet (81 mg total) by mouth daily. Swallow whole. 04/20/24   Arrien, Mauricio Daniel, MD  atorvastatin (LIPITOR) 40 MG tablet Take 1 tablet (40 mg total) by mouth at bedtime. 04/19/24   Arrien, Mauricio Daniel, MD  carvedilol (COREG) 12.5 MG tablet Take 1 tablet (12.5 mg total) by mouth 2 (two) times daily with a meal. 04/19/24   Arrien, Elidia Sieving, MD  doxycycline  (VIBRAMYCIN ) 100 MG capsule Take 1 capsule (100 mg total) by mouth 2 (two) times daily. 08/15/23   Prosperi, Christian H, PA-C  furosemide  (LASIX ) 20 MG  tablet Take 1 tablet (20 mg total) by mouth daily. 04/19/24   Arrien, Mauricio Daniel, MD  gabapentin (NEURONTIN) 800 MG tablet Take 800 mg by mouth QID.    [provider]  nitroGLYCERIN (NITROSTAT) 0.4 MG SL tablet Place 1 tablet (0.4 mg total) under the tongue every 5 (five) minutes as needed for chest pain. 04/19/24   Arrien, Elidia Sieving, MD  omeprazole (PRILOSEC) 40 MG capsule Take 40 mg by mouth  daily.    [provider]  oxyCODONE -acetaminophen  (PERCOCET) 10-325 MG tablet Take 1 tablet by mouth every 4 (four) hours as needed for pain. 1 q 6 hours and 1 at bedtime    [provider]  ticagrelor (BRILINTA) 90 MG TABS tablet Take 1 tablet (90 mg total) by mouth 2 (two) times daily. 04/19/24   Arrien, Elidia Sieving, MD  QUEtiapine (SEROQUEL) 25 MG tablet Take 25 mg by mouth at bedtime.  05/04/20  [provider]                                                                                                                                    Past Surgical History Past Surgical History:  Procedure Laterality Date   RECTAL SURGERY     Family History Family History  Problem Relation Age of Onset   Heart attack Father    Obesity Father     Social History Social History   Tobacco Use   Smoking status: Some Days    Types: Cigars   Smokeless tobacco: Never   Tobacco comments:    Occasional cigar  Vaping Use   Vaping status: Never Used  Substance Use Topics   Alcohol use: Yes    Comment: occ   Drug use: No   Allergies Lisinopril  Review of Systems A thorough review of systems was obtained and all systems are negative except as noted in the HPI and PMH.   Physical Exam Vital Signs  I have reviewed the triage vital signs BP (!) 159/87 (BP Location: Left Arm)   Pulse 80   Temp 98 F (36.7 C) (Oral)   Resp 18   Ht 5' 9 (1.753 m)   Wt 90.1 kg   SpO2 95%   BMI 29.34 kg/m  Physical Exam Vitals and nursing note reviewed.  Constitutional:      General: He is not in acute distress.    Appearance: He is well-developed.  HENT:     Head: Normocephalic and atraumatic.     Right Ear: External ear normal.     Left Ear: External ear normal.     Mouth/Throat:     Mouth: Mucous membranes are moist.  Eyes:     General: No scleral icterus. Cardiovascular:     Rate and Rhythm: Normal rate and regular rhythm.     Pulses: Normal pulses.     Heart  sounds: Normal heart sounds.  Pulmonary:  Effort: Pulmonary effort is normal. Tachypnea present. No respiratory distress.     Breath sounds: Rales present.  Abdominal:     General: Abdomen is flat.     Palpations: Abdomen is soft.     Tenderness: There is no abdominal tenderness.  Musculoskeletal:     Cervical back: No rigidity.     Right lower leg: No edema.     Left lower leg: No edema.  Skin:    General: Skin is warm and dry.     Capillary Refill: Capillary refill takes less than 2 seconds.  Neurological:     Mental Status: He is alert.  Psychiatric:        Mood and Affect: Mood normal.        Behavior: Behavior normal.     ED Results and Treatments Labs (all labs ordered are listed, but only abnormal results are displayed) Labs Reviewed  BASIC METABOLIC PANEL WITH GFR - Abnormal; Notable for the following components:      Result Value   Glucose, Bld 133 (*)    Calcium  8.4 (*)    All other components within normal limits  PRO BRAIN NATRIURETIC PEPTIDE - Abnormal; Notable for the following components:   Pro Brain Natriuretic Peptide 2,144.0 (*)    All other components within normal limits  CBC WITH DIFFERENTIAL/PLATELET - Abnormal; Notable for the following components:   RBC 3.94 (*)    Hemoglobin 12.4 (*)    HCT 36.3 (*)    All other components within normal limits  COMPREHENSIVE METABOLIC PANEL WITH GFR - Abnormal; Notable for the following components:   Glucose, Bld 138 (*)    Creatinine, Ser 1.25 (*)    Calcium  8.4 (*)    Total Protein 6.0 (*)    Albumin 2.7 (*)    AST 13 (*)    All other components within normal limits  GLUCOSE, CAPILLARY - Abnormal; Notable for the following components:   Glucose-Capillary 127 (*)    All other components within normal limits  GLUCOSE, CAPILLARY - Abnormal; Notable for the following components:   Glucose-Capillary 127 (*)    All other components within normal limits  GLUCOSE, CAPILLARY - Abnormal; Notable for the  following components:   Glucose-Capillary 146 (*)    All other components within normal limits  BASIC METABOLIC PANEL WITH GFR - Abnormal; Notable for the following components:   Glucose, Bld 126 (*)    Calcium  8.3 (*)    All other components within normal limits  TROPONIN T, HIGH SENSITIVITY - Abnormal; Notable for the following components:   Troponin T High Sensitivity 99 (*)    All other components within normal limits  TROPONIN T, HIGH SENSITIVITY - Abnormal; Notable for the following components:   Troponin T High Sensitivity 96 (*)    All other components within normal limits  RESP PANEL BY RT-PCR (RSV, FLU A&B, COVID)  RVPGX2  HIV ANTIBODY (ROUTINE TESTING W REFLEX)  GLUCOSE, CAPILLARY  MAGNESIUM  Radiology No results found.   Pertinent labs & imaging results that were available during my care of the patient were reviewed by me and considered in my medical decision making (see MDM for details).  Medications Ordered in ED Medications  aspirin  chewable tablet 324 mg (324 mg Oral Given 04/17/24 1248)  furosemide  (LASIX ) injection 40 mg (40 mg Intravenous Given 04/17/24 1350)  iohexol  (OMNIPAQUE ) 350 MG/ML injection 100 mL (100 mLs Intravenous Contrast Given 04/17/24 1256)  furosemide  (LASIX ) injection 40 mg (40 mg Intravenous Given 04/17/24 2324)  potassium chloride  SA (KLOR-CON  M) CR tablet 40 mEq (40 mEq Oral Given 04/19/24 0905)  magnesium sulfate IVPB 2 g 50 mL (2 g Intravenous New Bag/Given 04/19/24 0904)  irbesartan (AVAPRO) tablet 75 mg (75 mg Oral Given 04/19/24 1129)                                                                                                                                     Procedures .Critical Care  Performed by: Elnor Jayson LABOR, DO Authorized by: Elnor Jayson LABOR, DO   Critical care provider statement:    Critical care time  (minutes):  45   Critical care time was exclusive of:  Separately billable procedures and treating other patients   Critical care was necessary to treat or prevent imminent or life-threatening deterioration of the following conditions:  Cardiac failure   Critical care was time spent personally by me on the following activities:  Development of treatment plan with patient or surrogate, discussions with consultants, evaluation of patient's response to treatment, examination of patient, ordering and review of laboratory studies, ordering and review of radiographic studies, ordering and performing treatments and interventions, pulse oximetry, re-evaluation of patient's condition, review of old charts and obtaining history from patient or surrogate   Care discussed with: admitting provider     (including critical care time)  Medical Decision Making / ED Course    Medical Decision Making:    Damontre Millea is a 58 y.o. male with past medical history as below, significant for DM, GERD, PTSD, small bowel obstruction, NSTEMI who presents to the ED with complaint of chest pain dyspnea. The complaint involves an extensive differential diagnosis and also carries with it a high risk of complications and morbidity.  Serious etiology was considered. Ddx includes but is not limited to: Differential includes all life-threatening causes for chest pain. This includes but is not exclusive to acute coronary syndrome, aortic dissection, pulmonary embolism, cardiac tamponade, community-acquired pneumonia, pericarditis, musculoskeletal chest wall pain, etc.   Complete initial physical exam performed, notably the patient was in no acute distress, tachypnea noted but no hypoxia.    Reviewed and confirmed nursing documentation for past medical history, family history, social history.  Vital signs reviewed.    Dyspnea Chest pain Acute CHF> - Patient with dyspnea and chest pain since yesterday.  Had LHC around 2 weeks ago  with stent  at OSH.  Compliant with medications.  He has some rales, exertional dyspnea and orthopnea. - No LE edema, no reported weight gain - During prior admission in [facility] he had troponin 200-300, inferior T wave inversions on EKG.  Echo at that time with preserved EF and moderate to severe aortic regurgitation.  He was started on aspirin , Brilinta, metoprolol, Diovan, Lipitor.  He had stent to LAD - BNP is elevated 2100, troponin elevated 99 > 96, he has orthopnea, interstitial edema on chest x-ray.  Recent echo was reviewed.  Concern for new onset heart failure.  Start Lasix .  Given aspirin . - Concern for new onset heart failure, recommend admission.  Pt/family agreeable - Will discuss with cardiology - Spoke with Dr. Kate, recommends medical admission - spoke w/ Dr Fleeta who accepts pt for admit                    Additional history obtained: -Additional history obtained from na -External records from outside source obtained and reviewed including: Chart review including previous notes, labs, imaging, consultation notes including  Prior cardiology documentation from admission at OSH last month, home medications, prior echo   Lab Tests: -I ordered, reviewed, and interpreted labs.   The pertinent results include:   Labs Reviewed  BASIC METABOLIC PANEL WITH GFR - Abnormal; Notable for the following components:      Result Value   Glucose, Bld 133 (*)    Calcium  8.4 (*)    All other components within normal limits  PRO BRAIN NATRIURETIC PEPTIDE - Abnormal; Notable for the following components:   Pro Brain Natriuretic Peptide 2,144.0 (*)    All other components within normal limits  CBC WITH DIFFERENTIAL/PLATELET - Abnormal; Notable for the following components:   RBC 3.94 (*)    Hemoglobin 12.4 (*)    HCT 36.3 (*)    All other components within normal limits  COMPREHENSIVE METABOLIC PANEL WITH GFR - Abnormal; Notable for the following components:   Glucose, Bld 138  (*)    Creatinine, Ser 1.25 (*)    Calcium  8.4 (*)    Total Protein 6.0 (*)    Albumin 2.7 (*)    AST 13 (*)    All other components within normal limits  GLUCOSE, CAPILLARY - Abnormal; Notable for the following components:   Glucose-Capillary 127 (*)    All other components within normal limits  GLUCOSE, CAPILLARY - Abnormal; Notable for the following components:   Glucose-Capillary 127 (*)    All other components within normal limits  GLUCOSE, CAPILLARY - Abnormal; Notable for the following components:   Glucose-Capillary 146 (*)    All other components within normal limits  BASIC METABOLIC PANEL WITH GFR - Abnormal; Notable for the following components:   Glucose, Bld 126 (*)    Calcium  8.3 (*)    All other components within normal limits  TROPONIN T, HIGH SENSITIVITY - Abnormal; Notable for the following components:   Troponin T High Sensitivity 99 (*)    All other components within normal limits  TROPONIN T, HIGH SENSITIVITY - Abnormal; Notable for the following components:   Troponin T High Sensitivity 96 (*)    All other components within normal limits  RESP PANEL BY RT-PCR (RSV, FLU A&B, COVID)  RVPGX2  HIV ANTIBODY (ROUTINE TESTING W REFLEX)  GLUCOSE, CAPILLARY  MAGNESIUM    Notable for as above  EKG   EKG Interpretation Date/Time:  Friday April 17 2024 11:14:51 EDT Ventricular Rate:  88  PR Interval:  203 QRS Duration:  100 QT Interval:  382 QTC Calculation: 463 R Axis:   83  Text Interpretation: Sinus rhythm Borderline prolonged PR interval Abnormal T, consider ischemia, inferior leads lead 3 , avf twi similar to prior, TWI lead 2 appears new Confirmed by Elnor Savant ([id number]) on 04/17/2024 11:17:36 AM         Imaging Studies ordered: I ordered imaging studies including chest x-ray, CT PE I independently visualized the following imaging with scope of interpretation limited to determining acute life threatening conditions related to emergency care; findings  noted above I agree with the radiologist interpretation If any imaging was obtained with contrast I closely monitored patient for any possible adverse reaction a/w contrast administration in the emergency department   Medicines ordered and prescription drug management: Meds ordered this encounter  Medications   aspirin  chewable tablet 324 mg   furosemide  (LASIX ) injection 40 mg   iohexol  (OMNIPAQUE ) 350 MG/ML injection 100 mL   DISCONTD: carvedilol (COREG) tablet 12.5 mg   DISCONTD: amLODipine (NORVASC) tablet 5 mg   DISCONTD: irbesartan (AVAPRO) tablet 75 mg   DISCONTD: aspirin  EC tablet 81 mg   DISCONTD: ticagrelor (BRILINTA) tablet 90 mg   DISCONTD: atorvastatin (LIPITOR) tablet 40 mg   DISCONTD: doxycycline  (VIBRA -TABS) tablet 100 mg   DISCONTD: pantoprazole  (PROTONIX ) EC tablet 40 mg   furosemide  (LASIX ) injection 40 mg   DISCONTD: enoxaparin (LOVENOX) injection 40 mg   DISCONTD: insulin  aspart (novoLOG ) injection 0-9 Units    Correction coverage::   Sensitive (thin, NPO, renal)    CBG < 70::   Implement Hypoglycemia Standing Orders and refer to Hypoglycemia Standing Orders sidebar report    CBG 70 - 120::   0 units    CBG 121 - 150::   1 unit    CBG 151 - 200::   2 units    CBG 201 - 250::   3 units    CBG 251 - 300::   5 units    CBG 301 - 350::   7 units    CBG 351 - 400:   9 units    CBG > 400:   call MD and obtain STAT lab verification   DISCONTD: insulin  aspart (novoLOG ) injection 0-5 Units    Correction coverage::   HS scale    CBG < 70::   Implement Hypoglycemia Standing Orders and refer to Hypoglycemia Standing Orders sidebar report    CBG 70 - 120::   0 units    CBG 121 - 150::   0 units    CBG 151 - 200::   0 units    CBG 201 - 250::   2 units    CBG 251 - 300::   3 units    CBG 301 - 350::   4 units    CBG 351 - 400::   5 units    CBG > 400:   call MD and obtain STAT lab verification   DISCONTD: oxyCODONE -acetaminophen  (PERCOCET) 10-325 MG per tablet 1  tablet    Refill:  0    1 q 6 hours and 1 at bedtime     DISCONTD: gabapentin (NEURONTIN) capsule 800 mg   DISCONTD: oxyCODONE  (Oxy IR/ROXICODONE ) immediate release tablet 10 mg    Refill:  0   DISCONTD: acetaminophen  (TYLENOL ) tablet 325 mg   potassium chloride  SA (KLOR-CON  M) CR tablet 40 mEq   magnesium sulfate IVPB 2 g 50 mL   DISCONTD: irbesartan (AVAPRO) tablet  150 mg   irbesartan (AVAPRO) tablet 75 mg   DISCONTD: furosemide  (LASIX ) tablet 20 mg   valsartan (DIOVAN) 160 MG tablet    Sig: Take 1 tablet (160 mg total) by mouth daily.    Dispense:  30 tablet    Refill:  0   DISCONTD: nitroGLYCERIN (NITROSTAT) SL tablet 0.4 mg   ticagrelor (BRILINTA) 90 MG TABS tablet    Sig: Take 1 tablet (90 mg total) by mouth 2 (two) times daily.   furosemide  (LASIX ) 20 MG tablet    Sig: Take 1 tablet (20 mg total) by mouth daily.    Dispense:  30 tablet    Refill:  0   carvedilol (COREG) 12.5 MG tablet    Sig: Take 1 tablet (12.5 mg total) by mouth 2 (two) times daily with a meal.    Dispense:  60 tablet    Refill:  0   atorvastatin (LIPITOR) 40 MG tablet    Sig: Take 1 tablet (40 mg total) by mouth at bedtime.   aspirin  EC 81 MG tablet    Sig: Take 1 tablet (81 mg total) by mouth daily. Swallow whole.   nitroGLYCERIN (NITROSTAT) 0.4 MG SL tablet    Sig: Place 1 tablet (0.4 mg total) under the tongue every 5 (five) minutes as needed for chest pain.    -I have reviewed the patients home medicines and have made adjustments as needed   Consultations Obtained: I requested consultation with the cardiology,  and discussed lab and imaging findings as well as pertinent plan    Cardiac Monitoring: The patient was maintained on a cardiac monitor.  I personally viewed and interpreted the cardiac monitored which showed an underlying rhythm of: NSR Continuous pulse oximetry interpreted by myself, 97% on RA.    Social Determinants of Health:  Diagnosis or treatment significantly limited by  social determinants of health: current smoker Counseled patient for approximately 3 minutes regarding smoking cessation. Discussed risks of smoking and how they applied and affected their visit here today. Patient not ready to quit at this time, however will follow up with their primary doctor when they are.   CPT code: 00593: intermediate counseling for smoking cessation     Reevaluation: After the interventions noted above, I reevaluated the patient and found that they have improved  Co morbidities that complicate the patient evaluation  Past Medical History:  Diagnosis Date   Arthritis    Diabetes mellitus    Fistula, perirectal    GERD (gastroesophageal reflux disease)    Hypertension    Osteomyelitis of great toe of right foot (HCC) 10/31/2021   Polymicrobial bacterial infection 10/31/2021   PTSD (post-traumatic stress disorder)    PTSD (post-traumatic stress disorder) 10/31/2021   Small bowel obstruction (HCC)       Dispostion: Disposition decision including need for hospitalization was considered, and patient admitted to the hospital.    Final Clinical Impression(s) / ED Diagnoses Final diagnoses:  Acute congestive heart failure, unspecified heart failure type (HCC)        Elnor Jayson LABOR, DO 04/23/24 1038

## 2024-04-17 NOTE — ED Provider Notes (Addendum)
 Dr. Barnetta cardiology recommends medical admission.  They will see in consult.  Plan for admission to [facility].  Patient has had Lasix  IV.  He has diuresed 2 L and feels improved. Physical Exam  BP (!) 169/100   Pulse 90   Temp 97.6 F (36.4 C) (Oral)   Resp (!) 25   SpO2 98%   Physical Exam  Procedures  Procedures  ED Course / MDM    Medical Decision Making Amount and/or Complexity of Data Reviewed Labs: ordered. Radiology: ordered.  Risk OTC drugs. Prescription drug management. Decision regarding hospitalization.   Dr. Elnor has reviewed with hospitalist Dr. Fleeta for admission       Armenta Canning, MD 04/17/24 1514    Armenta Canning, MD 04/17/24 1517

## 2024-04-17 NOTE — ED Notes (Signed)
 Pt verbalized understanding of being transferred to [facility] for further treatment. [facility] at bedside. Out of ED not in visible distress. Belongings sent with wife.

## 2024-04-17 NOTE — ED Triage Notes (Signed)
 Central chest pain, SHOB, cough since last night.  Denies N/V/D or fevers  States had stent placed earlier this month.   Pt appears SHOB when speaking

## 2024-04-17 NOTE — ED Notes (Signed)
 Patient transported to CT

## 2024-04-17 NOTE — H&P (Signed)
 History and Physical    [name] FMW:[id number] DOB: Aug 21, 1965 DOA: 04/17/2024  PCP: [facility]  Patient coming from: [facility] ED  Chief Complaint: Shortness of breath  HPI: [name] is a 58 y.o. male with medical history significant of type 2 diabetes, history of right great toe amputation, hypertension, hyperlipidemia, tobacco abuse, GERD.  Recently admitted to [facility] 10/1-10/08/2023 for NSTEMI status post stenting of mid LAD.  Per discharge summary, troponin 200-300 and EKG was showing T wave inversions in inferior leads.  Echo done during this hospitalization was showing EF 50 to 55%, normal diastolic function, mild mitral regurgitation, and moderate to severe aortic regurgitation.  Patient presents today with a chief complaint of shortness of breath.  Patient states after he left the hospital, he started having shortness of breath and orthopnea a few days later which became progressively worse over time.  Yesterday night while he was in bed, it felt like somebody had a pillow on his face and he could barely breathe and was also having substernal chest pain at that time which has since resolved.  Denies any active chest pain/pressure/tightness at this time.  He reports improvement of his dyspnea after receiving IV Lasix  in the ED.  Also states he was previously having lower extremity edema which has improved.  Patient states he is being seen by wound care for an ulcer on his left great toe and was prescribed a 7-day course of doxycycline  2 days ago on 10/15.  Denies any fevers or toe pain.  No other complaints.  ED Course: Hypertensive with SBP up to 180s but remainder of vital signs stable.  Labs notable for hemoglobin 12.4 (stable), MCV 92.1, glucose 133, calcium  8.4 on BMP, troponin 99> 96, proBNP 2144, COVID/influenza/RSV PCR negative.  CTA chest negative for PE but showing pulmonary edema and small bilateral pleural effusions.  Also showing reflux of contrast  material into the IVC and hepatic veins suggesting right heart dysfunction.  Patient was given aspirin  324 mg and IV Lasix  40 mg.  He diuresed 2 L after IV Lasix .  ED physician discussed the case with Dr. Kate and cardiology service will consult.  Review of Systems:  Review of Systems  All other systems reviewed and are negative.   Past Medical History:  Diagnosis Date   Arthritis    Diabetes mellitus    Fistula, perirectal    GERD (gastroesophageal reflux disease)    Hypertension    Osteomyelitis of great toe of right foot (HCC) 10/31/2021   Polymicrobial bacterial infection 10/31/2021   PTSD (post-traumatic stress disorder)    PTSD (post-traumatic stress disorder) 10/31/2021   Small bowel obstruction (HCC)     Past Surgical History:  Procedure Laterality Date   RECTAL SURGERY       reports that he has been smoking cigars. He has never used smokeless tobacco. He reports current alcohol use. He reports that he does not use drugs.  Allergies  Allergen Reactions   Lisinopril Other (See Comments) and Hives    Other reaction(s): Electrocardiogram abnormal  Other Reaction(s): Electrocardiogram abnormal, Abdominal pain, Electrocardiogram abnormal, Abdominal pain    Family History  Problem Relation Age of Onset   Heart attack Father    Obesity Father     Prior to Admission medications   Medication Sig Start Date End Date Taking? Authorizing Provider  AMLODIPINE BESYLATE PO Take 10 mg by mouth daily.    [provider]  Cholecalciferol 25 MCG (1000 UT) capsule  Take by mouth.    [provider]  Docusate Sodium  (DSS) 100 MG CAPS Take by mouth. 03/19/20   [provider]  doxycycline  (VIBRAMYCIN ) 100 MG capsule Take 1 capsule (100 mg total) by mouth 2 (two) times daily. 08/15/23   Prosperi, Christian H, PA-C  escitalopram (LEXAPRO) 10 MG tablet Take 10 mg by mouth daily.    [provider]  furosemide  (LASIX ) 40 MG tablet Take 1 tablet (40 mg  total) by mouth daily as needed. 09/27/22   Dann Candyce RAMAN, MD  gabapentin (NEURONTIN) 800 MG tablet Take 800 mg by mouth QID.    [provider]  hydrOXYzine (VISTARIL) 25 MG capsule Take 25 mg by mouth 3 (three) times daily as needed. One tablet daily    [provider]  ibuprofen (ADVIL) 800 MG tablet Take 800 mg by mouth every 8 (eight) hours as needed.    [provider]  loratadine  (CLARITIN ) 10 MG tablet Take by mouth. 05/04/20   [provider]  losartan (COZAAR) 100 MG tablet Take 100 mg by mouth daily.    [provider]  metFORMIN  (GLUCOPHAGE ) 500 MG tablet Take 1 tablet (500 mg total) by mouth 2 (two) times daily with a meal. 08/28/23   Palumbo, April, MD  methocarbamol (ROBAXIN) 750 MG tablet TAKE ONE TABLET BY MOUTH TWICE A DAY AS NEEDED FOR MUSCLE SPASM 05/27/22   [provider]  naloxone (NARCAN) nasal spray 4 mg/0.1 mL SPRAY 1 SPRAY INTO ONE NOSTRIL AS DIRECTED FOR OPIOID OVERDOSE - CALL 911 IMMEDIATELY, ADMINISTER DOSE, THEN TURN PERSON ON SIDE - IF NO RESPONSE IN 2-3 MINUTES OR PERSON RESPONDS BUT RELAPSES, REPEAT USING A NEW SPRAY DEVICE AND SPRAY INTO THE OTHER NOSTRIL 09/19/22   [provider]  naproxen (NAPROSYN) 500 MG tablet Take 1 tablet by mouth 2 (two) times daily. 04/21/22   [provider]  omeprazole (PRILOSEC) 40 MG capsule Take 40 mg by mouth daily.    [provider]  oxyCODONE -acetaminophen  (PERCOCET) 10-325 MG tablet Take 1 tablet by mouth every 4 (four) hours as needed for pain. 1 q 6 hours and 1 at bedtime    [provider]  polyethylene glycol (MIRALAX  / GLYCOLAX ) 17 g packet Take 17 g by mouth daily. 03/19/20   Armenta Canning, MD  ranitidine  (ZANTAC ) 75 MG tablet Take by mouth. 01/08/14   [provider]  rosuvastatin  (CRESTOR ) 10 MG tablet Take 1 tablet (10 mg total) by mouth daily. 09/27/22   Dann Candyce RAMAN, MD  Semaglutide (OZEMPIC, 1 MG/DOSE, [place])  Inject 1 mg into the skin once a week.    [provider]  sertraline (ZOLOFT) 25 MG tablet Take 25 mg by mouth daily. Per patient is taking 1/2 tablet daily    [provider]  spironolactone (ALDACTONE) 25 MG tablet Take 25 mg by mouth daily.    [provider]  testosterone cypionate (DEPOTESTOTERONE CYPIONATE) 100 MG/ML injection Inject 100 mg into the muscle once a week. 06/13/20   [provider]  triamcinolone cream (KENALOG) 0.1 % APPLY THIN LAYER TO AFFECTED AREA TWICE A DAY AS NEEDED FOR ECZEMA 06/16/22   [provider]  zolpidem (AMBIEN) 10 MG tablet 1 tablet at bedtime as needed Orally Once a day    [provider]  QUEtiapine (SEROQUEL) 25 MG tablet Take 25 mg by mouth at bedtime.  05/04/20  [provider]    Physical Exam: Vitals:   04/17/24 1645 04/17/24  1700 04/17/24 1730 04/17/24 2012  BP:  (!) 189/114 (!) 169/97 (!) 159/90  Pulse: 89 90  87  Resp: (!) 0 (!) 25 19 20   Temp:    98.2 F (36.8 C)  TempSrc:    Oral  SpO2: 96% 92%  96%  Weight:    90.3 kg  Height:    5' 9 (1.753 m)    Physical Exam Vitals reviewed.  Constitutional:      General: He is not in acute distress. HENT:     Head: Normocephalic and atraumatic.  Eyes:     Extraocular Movements: Extraocular movements intact.  Cardiovascular:     Rate and Rhythm: Normal rate and regular rhythm.     Heart sounds: Murmur heard.  Pulmonary:     Effort: Pulmonary effort is normal. No respiratory distress.     Breath sounds: No wheezing or rales.  Abdominal:     General: Bowel sounds are normal.     Palpations: Abdomen is soft.     Tenderness: There is no abdominal tenderness. There is no guarding.  Musculoskeletal:     Cervical back: Normal range of motion.     Right lower leg: No edema.     Left lower leg: No edema.     Comments: Ulcer noted on the plantar surface of the left great toe without obvious signs of infection.  Skin:    General:  Skin is warm and dry.  Neurological:     General: No focal deficit present.     Mental Status: He is alert and oriented to person, place, and time.     Labs on Admission: I have personally reviewed following labs and imaging studies  CBC: Recent Labs  Lab 04/17/24 1130  WBC 5.2  NEUTROABS 2.3  HGB 12.4*  HCT 36.3*  MCV 92.1  PLT 254   Basic Metabolic Panel: Recent Labs  Lab 04/17/24 1130  NA 135  K 4.0  CL 102  CO2 22  GLUCOSE 133*  BUN 9  CREATININE 1.09  CALCIUM  8.4*   GFR: Estimated Creatinine Clearance: 83 mL/min (by C-G formula based on SCr of 1.09 mg/dL). Liver Function Tests: No results for input(s): AST, ALT, ALKPHOS, BILITOT, PROT, ALBUMIN in the last 168 hours. No results for input(s): LIPASE, AMYLASE in the last 168 hours. No results for input(s): AMMONIA in the last 168 hours. Coagulation Profile: No results for input(s): INR, PROTIME in the last 168 hours. Cardiac Enzymes: No results for input(s): CKTOTAL, CKMB, CKMBINDEX, TROPONINI in the last 168 hours. BNP (last 3 results) Recent Labs    04/17/24 1130  PROBNP 2,144.0*   HbA1C: No results for input(s): HGBA1C in the last 72 hours. CBG: No results for input(s): GLUCAP in the last 168 hours. Lipid Profile: No results for input(s): CHOL, HDL, LDLCALC, TRIG, CHOLHDL, LDLDIRECT in the last 72 hours. Thyroid Function Tests: No results for input(s): TSH, T4TOTAL, FREET4, T3FREE, THYROIDAB in the last 72 hours. Anemia Panel: No results for input(s): VITAMINB12, FOLATE, FERRITIN, TIBC, IRON, RETICCTPCT in the last 72 hours. Urine analysis:    Component Value Date/Time   COLORURINE YELLOW 07/10/2021 1750   APPEARANCEUR HAZY (A) 07/10/2021 1750   LABSPEC 1.020 07/10/2021 1750   PHURINE 6.0 07/10/2021 1750   GLUCOSEU 100 (A) 07/10/2021 1750   HGBUR SMALL (A) 07/10/2021 1750   BILIRUBINUR NEGATIVE 07/10/2021 1750   KETONESUR  NEGATIVE 07/10/2021 1750   PROTEINUR 30 (A) 07/10/2021 1750   UROBILINOGEN 0.2 05/30/2011 2011  NITRITE NEGATIVE 07/10/2021 1750   LEUKOCYTESUR SMALL (A) 07/10/2021 1750    Radiological Exams on Admission: CT Angio Chest PE W and/or Wo Contrast Result Date: 04/17/2024 CLINICAL DATA:  Chest pain and shortness of breath. Cough. Clinical concern for pulmonary embolus. EXAM: CT ANGIOGRAPHY CHEST WITH CONTRAST TECHNIQUE: Multidetector CT imaging of the chest was performed using the standard protocol during bolus administration of intravenous contrast. Multiplanar CT image reconstructions and MIPs were obtained to evaluate the vascular anatomy. RADIATION DOSE REDUCTION: This exam was performed according to the departmental dose-optimization program which includes automated exposure control, adjustment of the mA and/or kV according to patient size and/or use of iterative reconstruction technique. CONTRAST:  OMNIPAQUE  IOHEXOL  350 MG/ML SOLN COMPARISON:  CT chest 04/23/2022 FINDINGS: Cardiovascular: The heart size is normal. No substantial pericardial effusion. Coronary artery calcification is evident. Mild atherosclerotic calcification is noted in the wall of the thoracic aorta. There is no filling defect within the opacified pulmonary arteries to suggest the presence of an acute pulmonary embolus. Mediastinum/Nodes: No mediastinal lymphadenopathy. There is no hilar lymphadenopathy. The esophagus has normal imaging features. There is no axillary lymphadenopathy. Lungs/Pleura: Interlobular septal thickening noted bilaterally, more pronounced in the lung bases. There is associated patchy and nodular ground-glass opacity in the lower lungs bilaterally with small bilateral pleural effusions. 3 mm anterior right middle lobe nodule on 74/303 is stable since 04/23/2022. No followup imaging is recommended. Upper Abdomen: Reflux of contrast material into the IVC and hepatic veins suggests right heart dysfunction.  Musculoskeletal: No worrisome lytic or sclerotic osseous abnormality. Review of the MIP images confirms the above findings. IMPRESSION: 1. No CT evidence for acute pulmonary embolus. 2. Interlobular septal thickening with associated patchy and nodular ground-glass opacity in the lower lungs bilaterally. Imaging features are most suggestive of pulmonary edema. 3. Small bilateral pleural effusions. 4. Reflux of contrast material into the IVC and hepatic veins suggests right heart dysfunction. 5.  Aortic Atherosclerosis (ICD10-I70.0). Electronically Signed   By: Camellia Candle M.D.   On: 04/17/2024 14:27   DG Chest Port 1 View Result Date: 04/17/2024 EXAM: 1 VIEW(S) XRAY OF THE CHEST 04/17/2024 12:02:00 PM COMPARISON: Comparison to 07/30/2022. CLINICAL HISTORY: cp. Central chest pain, SHOB, cough since last night. Denies N/V/D or fevers. States had stent placed earlier this month. Pt appears SHOB when speaking. FINDINGS: LUNGS AND PLEURA: Mild diffuse interstitial densities are noted bilaterally consistent with pulmonary edema. No focal pulmonary opacity. No pleural effusion. No pneumothorax. HEART AND MEDIASTINUM: Stable cardiomediastinal silhouette. BONES AND SOFT TISSUES: No acute osseous abnormality. IMPRESSION: 1. Mild interstitial pulmonary edema. Electronically signed by: Lynwood Seip MD 04/17/2024 12:31 PM EDT RP Workstation: [id number]    EKG: Independently reviewed.  Sinus rhythm, borderline PR prolongation, T wave inversions in inferior leads.  Assessment and Plan  New onset heart failure proBNP 2144.  CT showing pulmonary edema, small bilateral pleural effusions, and findings suggestive right heart dysfunction.  Patient was recently admitted to outside hospital 10/1-10/3 for NSTEMI and echo done at that time was showing EF 50 to 55%, normal diastolic function, mild mitral regurgitation, and moderate to severe aortic regurgitation.  Repeat echocardiogram ordered.  Patient was given IV Lasix  40 mg  in the ED and diuresed 2 L while there.  Cardiology consulted and additional IV Lasix  40 mg x 1 ordered.  Monitor intake and output, daily weights, renal function, and electrolytes.  Low-sodium diet with fluid restriction.  CAD Recent NSTEMI status post stenting of mid LAD Troponin elevated  but flat, not consistent with ACS at this time.  Patient denies active chest pain/pressure/tightness at this time.  Cardiology consulted and made some medication changes to his antihypertensives.  Continue aspirin , Brilinta, atorvastatin, Coreg, and irbesartan.   Hypertension Most recent SBP in the 150s.  Continue IV Lasix  for diuresis as above.  Cardiology has made some medication changes.  Continue amlodipine, Coreg, and irbesartan.  Type 2 diabetes Glucose in the 130s.  Recent hemoglobin A1c 5.9 on 10/2 and insulin  and glipizide  were stopped during recent hospitalization.  Placed on sensitive sliding scale insulin  ACHS while inpatient.    Left great toe ulcer No obvious signs of infection on exam.  No fever or leukocytosis.  Patient is seen by wound care and was recently started on a 7-day course of doxycycline  on 10/15.  Continue doxycycline .  Diabetes appears to be well-controlled.  ABIs ordered.  Normocytic anemia Hemoglobin stable.  Hyperlipidemia Continue atorvastatin.  GERD Continue PPI.  DVT prophylaxis: Lovenox Code Status: Full Code (discussed with the patient) Family Communication: Wife at bedside. Level of care: Telemetry bed Admission status: It is my clinical opinion that admission to INPATIENT is reasonable and necessary because of the expectation that this patient will require hospital care that crosses at least 2 midnights to treat this condition based on the medical complexity of the problems presented.  Given the aforementioned information, the predictability of an adverse outcome is felt to be significant.  Editha Ram MD [facility]  If 7PM-7AM, please contact  night-coverage [email or web address]  04/17/2024, 8:47 PM

## 2024-04-18 ENCOUNTER — Inpatient Hospital Stay (HOSPITAL_COMMUNITY)

## 2024-04-18 DIAGNOSIS — D649 Anemia, unspecified: Secondary | ICD-10-CM

## 2024-04-18 DIAGNOSIS — I5033 Acute on chronic diastolic (congestive) heart failure: Secondary | ICD-10-CM

## 2024-04-18 DIAGNOSIS — I5031 Acute diastolic (congestive) heart failure: Secondary | ICD-10-CM | POA: Diagnosis not present

## 2024-04-18 DIAGNOSIS — I159 Secondary hypertension, unspecified: Secondary | ICD-10-CM

## 2024-04-18 DIAGNOSIS — Z9861 Coronary angioplasty status: Secondary | ICD-10-CM

## 2024-04-18 DIAGNOSIS — E1169 Type 2 diabetes mellitus with other specified complication: Secondary | ICD-10-CM

## 2024-04-18 DIAGNOSIS — E785 Hyperlipidemia, unspecified: Secondary | ICD-10-CM

## 2024-04-18 DIAGNOSIS — G8929 Other chronic pain: Secondary | ICD-10-CM

## 2024-04-18 DIAGNOSIS — I2510 Atherosclerotic heart disease of native coronary artery without angina pectoris: Secondary | ICD-10-CM

## 2024-04-18 LAB — COMPREHENSIVE METABOLIC PANEL WITH GFR
ALT: 7 U/L (ref 0–44)
AST: 13 U/L — ABNORMAL LOW (ref 15–41)
Albumin: 2.7 g/dL — ABNORMAL LOW (ref 3.5–5.0)
Alkaline Phosphatase: 65 U/L (ref 38–126)
Anion gap: 10 (ref 5–15)
BUN: 11 mg/dL (ref 6–20)
CO2: 23 mmol/L (ref 22–32)
Calcium: 8.4 mg/dL — ABNORMAL LOW (ref 8.9–10.3)
Chloride: 105 mmol/L (ref 98–111)
Creatinine, Ser: 1.25 mg/dL — ABNORMAL HIGH (ref 0.61–1.24)
GFR, Estimated: >60 mL/min (ref >60)
Glucose, Bld: 138 mg/dL — ABNORMAL HIGH (ref 70–99)
Potassium: 4 mmol/L (ref 3.5–5.1)
Sodium: 138 mmol/L (ref 135–145)
Total Bilirubin: 0.5 mg/dL (ref 0.0–1.2)
Total Protein: 6 g/dL — ABNORMAL LOW (ref 6.5–8.1)

## 2024-04-18 LAB — ECHOCARDIOGRAM COMPLETE
AR max vel: 2.92 cm2
AV Area VTI: 3.01 cm2
AV Area mean vel: 2.9 cm2
AV Mean grad: 3.3 mmHg
AV Peak grad: 6.6 mmHg
Ao pk vel: 1.28 m/s
Area-P 1/2: 4.15 cm2
Calc EF: 50.2 %
Height: 69 in
S' Lateral: 3.58 cm
Single Plane A2C EF: 42.2 %
Single Plane A4C EF: 56.1 %
Weight: 3185.21 [oz_av]

## 2024-04-18 LAB — GLUCOSE, CAPILLARY
Glucose-Capillary: 97 mg/dL (ref 70–99)
Glucose-Capillary: 127 mg/dL — ABNORMAL HIGH (ref 70–99)
Glucose-Capillary: 146 mg/dL — ABNORMAL HIGH (ref 70–99)

## 2024-04-18 LAB — HIV ANTIBODY (ROUTINE TESTING W REFLEX): HIV Screen 4th Generation wRfx: NONREACTIVE

## 2024-04-18 MED ORDER — OXYCODONE-ACETAMINOPHEN 10-325 MG PO TABS
1.0000 | ORAL_TABLET | ORAL | Status: DC | PRN
Start: 2024-04-18 — End: 2024-04-18

## 2024-04-18 MED ORDER — ACETAMINOPHEN 325 MG PO TABS
325.0000 mg | ORAL_TABLET | ORAL | Status: DC | PRN
  Administered 2024-04-18 – 2024-04-19 (×5): 325 mg via ORAL
  Filled 2024-04-18 (×5): qty 1

## 2024-04-18 MED ORDER — GABAPENTIN 400 MG PO CAPS
800.0000 mg | ORAL_CAPSULE | Freq: Four times a day (QID) | ORAL | Status: DC
  Administered 2024-04-18 – 2024-04-19 (×5): 800 mg via ORAL
  Filled 2024-04-18 (×5): qty 2

## 2024-04-18 MED ORDER — OXYCODONE HCL 5 MG PO TABS
10.0000 mg | ORAL_TABLET | ORAL | Status: DC | PRN
  Administered 2024-04-18 – 2024-04-19 (×5): 10 mg via ORAL
  Filled 2024-04-18 (×5): qty 2

## 2024-04-18 NOTE — Assessment & Plan Note (Signed)
 No acute coronary syndrome, high sensitive troponin elevation due to heart failure.  Continue with ticagrelor and aspirin  for dual antiplatelet therapy Continue aspirin 

## 2024-04-18 NOTE — Progress Notes (Signed)
 Rounding Note   Patient Name: Coye Dawood Date of Encounter: 04/18/2024  [facility] Cardiologist: New  Subjective No complaints  Scheduled Meds:  amLODipine  5 mg Oral Daily   aspirin  EC  81 mg Oral Daily   atorvastatin  40 mg Oral QHS   carvedilol  12.5 mg Oral BID WC   doxycycline   100 mg Oral Q12H   enoxaparin (LOVENOX) injection  40 mg Subcutaneous QHS   insulin  aspart  0-5 Units Subcutaneous QHS   insulin  aspart  0-9 Units Subcutaneous TID WC   irbesartan  75 mg Oral Daily   pantoprazole   40 mg Oral Daily   ticagrelor  90 mg Oral BID   Continuous Infusions:  PRN Meds:    Vital Signs  Vitals:   04/18/24 0040 04/18/24 0437 04/18/24 0748 04/18/24 0904  BP: (!) 155/84 (!) 156/91 (!) 158/92 (!) 169/99  Pulse: 81 80 84 87  Resp: 18 19 18 18   Temp: 99 F (37.2 C) 98.8 F (37.1 C) 98.2 F (36.8 C)   TempSrc: Oral Oral Oral   SpO2:  100% 100% 99%  Weight:  90.3 kg    Height:        Intake/Output Summary (Last 24 hours) at 04/18/2024 0935 Last data filed at 04/18/2024 0440 Gross per 24 hour  Intake 837 ml  Output 2400 ml  Net -1563 ml      04/18/2024    4:37 AM 04/17/2024    8:12 PM 08/15/2023    4:16 PM  Last 3 Weights  Weight (lbs) 199 lb 1.2 oz 199 lb 1.6 oz 193 lb  Weight (kg) 90.3 kg 90.311 kg 87.544 kg      Telemetry NSR - Personally Reviewed  ECG  N/a - Personally Reviewed  Physical Exam  GEN: No acute distress.   Neck: No JVD Cardiac: RRR, no murmurs, rubs, or gallops.  Respiratory: Clear to auscultation bilaterally. GI: Soft, nontender, non-distended  MS: No edema; No deformity. Neuro:  Nonfocal  Psych: Normal affect   Labs High Sensitivity Troponin:  No results for input(s): TROPONINIHS in the last 720 hours.   Chemistry Recent Labs  Lab 04/17/24 1130 04/18/24 0452  NA 135 138  K 4.0 4.0  CL 102 105  CO2 22 23  GLUCOSE 133* 138*  BUN 9 11  CREATININE 1.09 1.25*  CALCIUM  8.4* 8.4*  PROT  --  6.0*   ALBUMIN  --  2.7*  AST  --  13*  ALT  --  7  ALKPHOS  --  65  BILITOT  --  0.5  GFRNONAA >60 >60  ANIONGAP 11 10    Lipids No results for input(s): CHOL, TRIG, HDL, LABVLDL, LDLCALC, CHOLHDL in the last 168 hours.  Hematology Recent Labs  Lab 04/17/24 1130  WBC 5.2  RBC 3.94*  HGB 12.4*  HCT 36.3*  MCV 92.1  MCH 31.5  MCHC 34.2  RDW 13.2  PLT 254   Thyroid No results for input(s): TSH, FREET4 in the last 168 hours.  BNP Recent Labs  Lab 04/17/24 1130  PROBNP 2,144.0*    DDimer No results for input(s): DDIMER in the last 168 hours.   Radiology  CT Angio Chest PE W and/or Wo Contrast Result Date: 04/17/2024 CLINICAL DATA:  Chest pain and shortness of breath. Cough. Clinical concern for pulmonary embolus. EXAM: CT ANGIOGRAPHY CHEST WITH CONTRAST TECHNIQUE: Multidetector CT imaging of the chest was performed using the standard protocol during bolus administration of intravenous contrast.  Multiplanar CT image reconstructions and MIPs were obtained to evaluate the vascular anatomy. RADIATION DOSE REDUCTION: This exam was performed according to the departmental dose-optimization program which includes automated exposure control, adjustment of the mA and/or kV according to patient size and/or use of iterative reconstruction technique. CONTRAST:  OMNIPAQUE  IOHEXOL  350 MG/ML SOLN COMPARISON:  CT chest 04/23/2022 FINDINGS: Cardiovascular: The heart size is normal. No substantial pericardial effusion. Coronary artery calcification is evident. Mild atherosclerotic calcification is noted in the wall of the thoracic aorta. There is no filling defect within the opacified pulmonary arteries to suggest the presence of an acute pulmonary embolus. Mediastinum/Nodes: No mediastinal lymphadenopathy. There is no hilar lymphadenopathy. The esophagus has normal imaging features. There is no axillary lymphadenopathy. Lungs/Pleura: Interlobular septal thickening noted bilaterally,  more pronounced in the lung bases. There is associated patchy and nodular ground-glass opacity in the lower lungs bilaterally with small bilateral pleural effusions. 3 mm anterior right middle lobe nodule on 74/303 is stable since 04/23/2022. No followup imaging is recommended. Upper Abdomen: Reflux of contrast material into the IVC and hepatic veins suggests right heart dysfunction. Musculoskeletal: No worrisome lytic or sclerotic osseous abnormality. Review of the MIP images confirms the above findings. IMPRESSION: 1. No CT evidence for acute pulmonary embolus. 2. Interlobular septal thickening with associated patchy and nodular ground-glass opacity in the lower lungs bilaterally. Imaging features are most suggestive of pulmonary edema. 3. Small bilateral pleural effusions. 4. Reflux of contrast material into the IVC and hepatic veins suggests right heart dysfunction. 5.  Aortic Atherosclerosis (ICD10-I70.0). Electronically Signed   By: Camellia Candle M.D.   On: 04/17/2024 14:27   DG Chest Port 1 View Result Date: 04/17/2024 EXAM: 1 VIEW(S) XRAY OF THE CHEST 04/17/2024 12:02:00 PM COMPARISON: Comparison to 07/30/2022. CLINICAL HISTORY: cp. Central chest pain, SHOB, cough since last night. Denies N/V/D or fevers. States had stent placed earlier this month. Pt appears SHOB when speaking. FINDINGS: LUNGS AND PLEURA: Mild diffuse interstitial densities are noted bilaterally consistent with pulmonary edema. No focal pulmonary opacity. No pleural effusion. No pneumothorax. HEART AND MEDIASTINUM: Stable cardiomediastinal silhouette. BONES AND SOFT TISSUES: No acute osseous abnormality. IMPRESSION: 1. Mild interstitial pulmonary edema. Electronically signed by: Lynwood Seip MD 04/17/2024 12:31 PM EDT RP Workstation: [id number]    Cardiac Studies   Patient Profile   Alferd Obryant is a 58 y.o. male with a hx of recent NSTEMI who is being seen 04/17/2024 for the evaluation of SOB at the request of hospitalist  service.   Assessment & Plan   1.Hypertensive urgency - SBP to 180s on presentation - metoprolol changed to  coreg 12.5 mg bid for better bp effect., irbesartan 75mg  daily. Norvac 5mg  daily added - he reports changes made during his recent admission and feels caused elevations in bp's, reports his norvasc and aldactone were stopped  2. Acute HFpEF - proBNP 2100, CXR with pulm edema and bilateral effusions - likely exacerbated by HTN, SBP in the 180s - 10/2022 echo: LVEF 60-65%, no WMAs, grade I dd, normal RV - 04/02/2024 echo OSH: LVEF 50-55%, normal diastolic function, mod to severe AI - repeat echo pending  - received IV lasix  40mg  x 2 yesterday. I/Os indicate neg 1.5L. Uptrend in Cr, diuretics stopped.  - likely start low dose oral lasix  tomorrow.   3. CAD - admit 04/2024 to OSH (records in care everywhere) with NSTEMI - DES to LAD, CTO of distal RCA, otherwise moderate disease - 04/2024 echo during admission: LVEF 50-55%, mod  to severe AI - medical therapy with ASA 81, brillinta 90mg  bid, atorva 40, irbesartan 75mg , coreg 12.5mg  bid.   F/u echo, follow bp's with changes. Possible d/c tomorrow.        Bonney Alvan Carrier, MD  04/18/2024, 9:35 AM

## 2024-04-18 NOTE — Hospital Course (Addendum)
 Mr. Taras was admitted to the hospital with the working diagnosis of heart failure exacerbation   58 yo male with the past medical history of hypertension, hyperlipidemia, coronary artery disease, GERD and T2DM who presented with dyspnea.   Recent hospitalization at outside hospital 10/01 to 04/03/24 for NSTEMI, sp PCI to mid LAD. His echocardiogram had preserved LV systolic function.  At home patient developed dyspnea on exertion and orthopnea. The night prior to admission he had severe PND, and chest pain, prompting him to come to the ED.  On his initial physical examination his blood pressure was 189/114, HR 90, RR 25 and 02 saturation 92% Lungs with no wheezing or rhonchi, heart with S1 and S2 present with positive systolic murmur, abdomen with no distention, no lower extremity edema. Left great toe, plantar ulcerated wound.  Na 135, K 4.0 Cl 102 bicarbonate 22 glucose 133 bun 9 cr 1.0  BNP 2,144 High sensitive troponin 99, and 96  Wbc 5.2 hgb 12.4 plt 254   Influenza negative RSV negative Sars covid 19 negative   Chest radiograph with cardiomegaly, bilateral hilar vascular congestion, bilateral cephalization of the vasculature, bilateral central interstitial infiltrates, predominant at the lower lobes.   CT chest with bilateral ground glass opacities, bilateral lower lobes interlobular septal thickening, with bilateral pleural effusions.  No CT evidence of pulmonary embolism.   EKG 88 bpm, normal axis, normal intervals, qtc 463, sinus rhythm with 1st degree AV block, no significant ST segment changes, negative T wave lead II, III, aVF.   Patient was placed on IV furosemide  with improvement in volume status. Echocardiogram with diastolic dysfunction.  Plan to follow up as outpatient.

## 2024-04-18 NOTE — Assessment & Plan Note (Signed)
 Continue carvedilol and irbesartan Currently on amlodipine 5 mg, to consider transition from ABR to entresto for better blood pressure control.

## 2024-04-18 NOTE — Assessment & Plan Note (Signed)
 Cell count has been stable.   GERD continue with proton pump inhibitor

## 2024-04-18 NOTE — Assessment & Plan Note (Signed)
 Resume home regimen with oxycodone , acetaminophen  and gabapentin.

## 2024-04-18 NOTE — Assessment & Plan Note (Signed)
 Patient was placed on insulin  sliding scale for glucose cover and monitoring. Glucose remained stable.   Continue statin

## 2024-04-18 NOTE — Progress Notes (Addendum)
 Progress Note   Patient: [name] DOB: 06/25/1966 DOA: 04/17/2024     1 DOS: the patient was seen and examined on 04/18/2024   Brief hospital course: [name] was admitted to the hospital with the working diagnosis of heart failure exacerbation   58 yo male with the past medical history of hypertension, hyperlipidemia, coronary artery disease, GERD and T2DM who presented with dyspnea.   Recent hospitalization at outside hospital 10/01 to 04/03/24 for NSTEMI, sp PCI to mid LAD. His echocardiogram had preserved LV systolic function.  At home patient developed dyspnea on exertion and orthopnea. The night prior to admission he had severe PND, and chest pain, prompting him to come to the ED.  On his initial physical examination his blood pressure was 189/114, HR 90, RR 25 and 02 saturation 92% Lungs with no wheezing or rhonchi, heart with S1 and S2 present with positive systolic murmur, abdomen with no distention, no lower extremity edema. Left great toe, plantar ulcerated wound.  Na 135, K 4.0 Cl 102 bicarbonate 22 glucose 133 bun 9 cr 1.0  BNP 2,144 High sensitive troponin 99, and 96  Wbc 5.2 hgb 12.4 plt 254   Influenza negative RSV negative Sars covid 19 negative   Chest radiograph with cardiomegaly, bilateral hilar vascular congestion, bilateral cephalization of the vasculature, bilateral central interstitial infiltrates, predominant at the lower lobes.   CT chest with bilateral ground glass opacities, bilateral lower lobes interlobular septal thickening, with bilateral pleural effusions.  No CT evidence of pulmonary embolism.   EKG 88 bpm, normal axis, normal intervals, qtc 463, sinus rhythm with 1st degree AV block, no significant ST segment changes, negative T wave lead II, III, aVF.   Assessment and Plan: * Acute on chronic diastolic heart failure (HCC) Pending repeat echocardiogram.   Volume status has improved.  Urine output is 2,400 cc Systolic blood  pressure 160 to 170 mmHg.   Plan to continue carvedilol, and irbesratan.  Possible transition to entresto for better blood pressure control Consider adding spironolactone.  Holding on loop diuretic for now.   CAD S/P percutaneous coronary angioplasty No acute coronary syndrome, high sensitive troponin elevation due to heart failure.  Continue with ticagrelor and aspirin  for dual antiplatelet therapy Continue aspirin    HTN (hypertension) Continue carvedilol and irbesartan Currently on amlodipine 5 mg, to consider transition from ABR to entresto for better blood pressure control.   Type 2 diabetes mellitus with hyperlipidemia (HCC) Continue insulin  sliding scale for glucose cover and monitoring Patient is tolerating po well.  Continue statin   Toe ulcer (HCC) Continue local wound care.  Continue with doxycyline   Chronic anemia Cell count has been stable.   GERD continue with pantoprazole .   Chronic pain Resume home regimen with oxycodone , acetaminophen  and gabapentin.      Subjective: Patient with improvement in dyspnea and lower extremity edema, no chest pain, no orthopnea or PND.   Physical Exam: Vitals:   04/18/24 0040 04/18/24 0437 04/18/24 0748 04/18/24 0904  BP: (!) 155/84 (!) 156/91 (!) 158/92 (!) 169/99  Pulse: 81 80 84 87  Resp: 18 19 18 18   Temp: 99 F (37.2 C) 98.8 F (37.1 C) 98.2 F (36.8 C)   TempSrc: Oral Oral Oral   SpO2:  100% 100% 99%  Weight:  90.3 kg    Height:       Neurology awake and alert ENT with no pallor or icterus Cardiovascular with S1 and S2 present and regular with no gallops,  rubs or murmurs Respiratory with no rales or wheezing, no rhonchi Abdomen with no distention  No lower extremity edema   Data Reviewed:    Family Communication: no family at the bedside   Disposition: Status is: Inpatient Remains inpatient appropriate because: recovering heart failure   Planned Discharge Destination:  Home     Author: Elidia Toribio Furnace, MD 04/18/2024 10:34 AM  For on call review [email or web address].

## 2024-04-18 NOTE — Assessment & Plan Note (Signed)
 Pending repeat echocardiogram.   Volume status has improved.  Urine output is 2,400 cc Systolic blood pressure 160 to 170 mmHg.   Plan to continue carvedilol, and irbesratan.  Possible transition to entresto for better blood pressure control Consider adding spironolactone.  Holding on loop diuretic for now.

## 2024-04-18 NOTE — Progress Notes (Signed)
  Echocardiogram 2D Echocardiogram has been performed.  Devora Ellouise SAUNDERS 04/18/2024, 3:01 PM

## 2024-04-18 NOTE — Assessment & Plan Note (Signed)
 Continue local wound care.  Continue with doxycyline

## 2024-04-19 ENCOUNTER — Other Ambulatory Visit (HOSPITAL_COMMUNITY): Payer: Self-pay

## 2024-04-19 ENCOUNTER — Inpatient Hospital Stay (HOSPITAL_COMMUNITY)

## 2024-04-19 DIAGNOSIS — I5031 Acute diastolic (congestive) heart failure: Secondary | ICD-10-CM | POA: Diagnosis not present

## 2024-04-19 LAB — BASIC METABOLIC PANEL WITH GFR
Anion gap: 9 (ref 5–15)
BUN: 13 mg/dL (ref 6–20)
CO2: 25 mmol/L (ref 22–32)
Calcium: 8.3 mg/dL — ABNORMAL LOW (ref 8.9–10.3)
Chloride: 103 mmol/L (ref 98–111)
Creatinine, Ser: 1.06 mg/dL (ref 0.61–1.24)
GFR, Estimated: >60 mL/min (ref >60)
Glucose, Bld: 126 mg/dL — ABNORMAL HIGH (ref 70–99)
Potassium: 3.7 mmol/L (ref 3.5–5.1)
Sodium: 137 mmol/L (ref 135–145)

## 2024-04-19 LAB — MAGNESIUM: Magnesium: 1.7 mg/dL (ref 1.7–2.4)

## 2024-04-19 MED ORDER — NITROGLYCERIN 0.4 MG SL SUBL: 0.4000 mg | SUBLINGUAL_TABLET | SUBLINGUAL | Status: DC | PRN

## 2024-04-19 MED ORDER — POTASSIUM CHLORIDE CRYS ER 20 MEQ PO TBCR
40.0000 meq | EXTENDED_RELEASE_TABLET | Freq: Once | ORAL | Status: AC
  Administered 2024-04-19: 40 meq via ORAL
  Filled 2024-04-19: qty 2

## 2024-04-19 MED ORDER — FUROSEMIDE 20 MG PO TABS
20.0000 mg | ORAL_TABLET | Freq: Every day | ORAL | Status: DC
Start: 2024-04-19 — End: 2024-04-19
  Administered 2024-04-19: 20 mg via ORAL
  Filled 2024-04-19: qty 1

## 2024-04-19 MED ORDER — IRBESARTAN 150 MG PO TABS: 150.0000 mg | ORAL_TABLET | Freq: Every day | ORAL | Status: DC

## 2024-04-19 MED ORDER — ASPIRIN 81 MG PO TBEC: 81.0000 mg | DELAYED_RELEASE_TABLET | Freq: Every day | ORAL | Status: AC

## 2024-04-19 MED ORDER — CARVEDILOL 12.5 MG PO TABS
12.5000 mg | ORAL_TABLET | Freq: Two times a day (BID) | ORAL | 0 refills | Status: AC
  Filled 2024-04-19: qty 60, 30d supply, fill #0

## 2024-04-19 MED ORDER — MAGNESIUM SULFATE 2 GM/50ML IV SOLN
2.0000 g | Freq: Once | INTRAVENOUS | Status: AC
  Administered 2024-04-19: 2 g via INTRAVENOUS
  Filled 2024-04-19: qty 50

## 2024-04-19 MED ORDER — FUROSEMIDE 20 MG PO TABS
20.0000 mg | ORAL_TABLET | Freq: Every day | ORAL | 0 refills | Status: AC
  Filled 2024-04-19: qty 30, 30d supply, fill #0

## 2024-04-19 MED ORDER — ATORVASTATIN CALCIUM 40 MG PO TABS: 40.0000 mg | ORAL_TABLET | Freq: Every day | ORAL | Status: AC

## 2024-04-19 MED ORDER — NITROGLYCERIN 0.4 MG SL SUBL: 0.4000 mg | SUBLINGUAL_TABLET | SUBLINGUAL | Status: AC | PRN

## 2024-04-19 MED ORDER — VALSARTAN 160 MG PO TABS
160.0000 mg | ORAL_TABLET | Freq: Every day | ORAL | 0 refills | Status: AC
  Filled 2024-04-19: qty 30, 30d supply, fill #0

## 2024-04-19 MED ORDER — TICAGRELOR 90 MG PO TABS: 90.0000 mg | ORAL_TABLET | Freq: Two times a day (BID) | ORAL | Status: AC

## 2024-04-19 MED ORDER — IRBESARTAN 75 MG PO TABS
75.0000 mg | ORAL_TABLET | Freq: Once | ORAL | Status: AC
  Administered 2024-04-19: 75 mg via ORAL
  Filled 2024-04-19: qty 1

## 2024-04-19 NOTE — Discharge Summary (Addendum)
 Physician Discharge Summary   Patient: [name] [name] MRN: [id number] DOB: [birth year]  Admit date:     04/17/2024  Discharge date: 04/19/24  Discharge Physician: Elidia Toribio Furnace   PCP: [facility]   Recommendations at discharge:    Patient was placed on higher dose of valsartan  and metoprolol  changes to carvedilol for better blood pressure control. Added furosemide  for diuresis Follow up renal function and electrolytes in 7 days as outpatient Follow up with Primary Care, in 7 to 10 days Follow up with Cardiology as scheduled.   Discharge Diagnoses: Principal Problem:   Acute on chronic diastolic heart failure (HCC) Active Problems:   CAD S/P percutaneous coronary angioplasty   HTN (hypertension)   Type 2 diabetes mellitus with hyperlipidemia (HCC)   Toe ulcer (HCC)   Chronic anemia   Chronic pain  Resolved Problems:   * No resolved hospital problems. [facility] Course: [name] [name] was admitted to the hospital with the working diagnosis of heart failure exacerbation   58 yo male with the past medical history of hypertension, hyperlipidemia, coronary artery disease, GERD and T2DM who presented with dyspnea.   Recent hospitalization at outside hospital 10/01 to 04/03/24 for NSTEMI, sp PCI to mid LAD. His echocardiogram had preserved LV systolic function.  At home patient developed dyspnea on exertion and orthopnea. The night prior to admission he had severe PND, and chest pain, prompting him to come to the ED.  On his initial physical examination his blood pressure was 189/114, HR 90, RR 25 and 02 saturation 92% Lungs with no wheezing or rhonchi, heart with S1 and S2 present with positive systolic murmur, abdomen with no distention, no lower extremity edema. Left great toe, plantar ulcerated wound.  Na 135, K 4.0 Cl 102 bicarbonate 22 glucose 133 bun 9 cr 1.0  BNP 2,144 High sensitive troponin 99, and 96  Wbc 5.2 hgb 12.4 plt 254   Influenza negative RSV  negative Sars covid 19 negative   Chest radiograph with cardiomegaly, bilateral hilar vascular congestion, bilateral cephalization of the vasculature, bilateral central interstitial infiltrates, predominant at the lower lobes.   CT chest with bilateral ground glass opacities, bilateral lower lobes interlobular septal thickening, with bilateral pleural effusions.  No CT evidence of pulmonary embolism.   EKG 88 bpm, normal axis, normal intervals, qtc 463, sinus rhythm with 1st degree AV block, no significant ST segment changes, negative T wave lead II, III, aVF.   Patient was placed on IV furosemide  with improvement in volume status. Echocardiogram with diastolic dysfunction.  Plan to follow up as outpatient.   Assessment and Plan: * Acute on chronic diastolic heart failure (HCC) Echocardiogram with preserved LV systolic function EF 55 to 60%, mild LVH, grade I diastolic dysfunction with impaired relaxation, RV systolic function preserved, mild mitral valve regurgitation, moderate aortic valve regurgitation, mild dilatation of aortic root 37 mm,   Patient was placed on IV furosemide  for diuresis, negative fluid balance was achieved, with improvement in his symptoms.   Plan to continue carvedilol and increased dose of valsartan  for better blood pressure control.    CAD S/P percutaneous coronary angioplasty No acute coronary syndrome, high sensitive troponin elevation due to heart failure.  Continue with ticagrelor  and aspirin  for dual antiplatelet therapy Continue as needed nitroglycerin .   HTN (hypertension) Continue blood pressure control with carvedilol and valsartan . Follow up as outpatient.   Type 2 diabetes mellitus with hyperlipidemia (HCC) Patient was placed on insulin  sliding scale for glucose cover and  monitoring. Glucose remained stable.   Continue statin   Toe ulcer (HCC) Continue local wound care.  Continue with doxycyline   Chronic anemia Cell count has been  stable.   GERD continue with proton pump inhibitor   Chronic pain Continue home regimen with oxycodone , acetaminophen  and gabapentin.    Consultants: Cardiology  Procedures performed: none   Disposition: Home Diet recommendation:  Cardiac diet DISCHARGE MEDICATION: Allergies as of 04/19/2024       Reactions   Lisinopril Other (See Comments), Hives   Other reaction(s): Electrocardiogram abnormal Other Reaction(s): Electrocardiogram abnormal, Abdominal pain, Electrocardiogram abnormal, Abdominal pain        Medication List     STOP taking these medications    AMLODIPINE BESYLATE PO   Cholecalciferol 25 MCG (1000 UT) capsule   DSS 100 MG Caps   escitalopram 10 MG tablet Commonly known as: LEXAPRO   hydrOXYzine 25 MG capsule Commonly known as: VISTARIL   ibuprofen 800 MG tablet Commonly known as: ADVIL   loratadine  10 MG tablet Commonly known as: CLARITIN    losartan 100 MG tablet Commonly known as: COZAAR   metFORMIN  500 MG tablet Commonly known as: GLUCOPHAGE    methocarbamol 750 MG tablet Commonly known as: ROBAXIN   naloxone 4 MG/0.1ML Liqd nasal spray kit Commonly known as: NARCAN   naproxen 500 MG tablet Commonly known as: NAPROSYN   OZEMPIC (1 MG/DOSE) [place]   polyethylene glycol 17 g packet Commonly known as: MIRALAX  / GLYCOLAX    ranitidine  75 MG tablet Commonly known as: ZANTAC    rosuvastatin  10 MG tablet Commonly known as: CRESTOR    sertraline 25 MG tablet Commonly known as: ZOLOFT   spironolactone 25 MG tablet Commonly known as: ALDACTONE   testosterone cypionate 100 MG/ML injection Commonly known as: DEPOTESTOTERONE CYPIONATE   triamcinolone cream 0.1 % Commonly known as: KENALOG   zolpidem 10 MG tablet Commonly known as: AMBIEN       TAKE these medications    aspirin  EC 81 MG tablet Take 1 tablet (81 mg total) by mouth daily. Swallow whole. Start taking on: April 20, 2024   atorvastatin 40 MG tablet Commonly known  as: LIPITOR Take 1 tablet (40 mg total) by mouth at bedtime.   carvedilol 12.5 MG tablet Commonly known as: COREG Take 1 tablet (12.5 mg total) by mouth 2 (two) times daily with a meal.   doxycycline  100 MG capsule Commonly known as: VIBRAMYCIN  Take 1 capsule (100 mg total) by mouth 2 (two) times daily.   furosemide  20 MG tablet Commonly known as: LASIX  Take 1 tablet (20 mg total) by mouth daily.   gabapentin 800 MG tablet Commonly known as: NEURONTIN Take 800 mg by mouth QID.   nitroGLYCERIN 0.4 MG SL tablet Commonly known as: NITROSTAT Place 1 tablet (0.4 mg total) under the tongue every 5 (five) minutes as needed for chest pain.   omeprazole 40 MG capsule Commonly known as: PRILOSEC Take 40 mg by mouth daily.   oxyCODONE -acetaminophen  10-325 MG tablet Commonly known as: PERCOCET Take 1 tablet by mouth every 4 (four) hours as needed for pain. 1 q 6 hours and 1 at bedtime   ticagrelor 90 MG Tabs tablet Commonly known as: BRILINTA Take 1 tablet (90 mg total) by mouth 2 (two) times daily.   valsartan 160 MG tablet Commonly known as: Diovan Take 1 tablet (160 mg total) by mouth daily. Start taking on: April 20, 2024        Discharge Exam: Fredricka Weights  04/17/24 2012 04/18/24 0437 04/19/24 0552  Weight: 90.3 kg 90.3 kg 90.1 kg   BP (!) 159/87 (BP Location: Left Arm)   Pulse 80   Temp 98 F (36.7 C) (Oral)   Resp 18   Ht 5' 9 (1.753 m)   Wt 90.1 kg   SpO2 95%   BMI 29.34 kg/m    Patient with no chest pain or dyspnea, no PND, orthopnea or lower extremity edema   Neurology awake and alert ENT with mild pallor  Cardiovascular with S1 and S2 present and regular with no gallops rubs or murmurs No JVD Respiratory with no rales or wheezing, no rhonchi  Abdomen with no distention  No lower extremity edema  Left foot first toe ulcerated wound no erythema or drainage.     Condition at discharge: stable  The results of significant diagnostics from  this hospitalization (including imaging, microbiology, ancillary and laboratory) are listed below for reference.   Imaging Studies: ECHOCARDIOGRAM COMPLETE Result Date: 04/18/2024    ECHOCARDIOGRAM REPORT   Patient Name:   MIGEL HANNIS Date of Exam: 04/18/2024 Medical Rec #:  [id number]    Height:       69.0 in Accession #:    [id number]   Weight:       199.1 lb Date of Birth:  08-Mar-1966   BSA:          2.062 m Patient Age:    57 years     BP:           156/87 mmHg Patient Gender: M            HR:           75 bpm. Exam Location:  Inpatient Procedure: 2D Echo, Cardiac Doppler and Color Doppler (Both Spectral and Color            Flow Doppler were utilized during procedure). Indications:    I50.40* Unspecified combined systolic (congestive) and diastolic                 (congestive) heart failure  History:        Patient has prior history of Echocardiogram examinations, most                 recent 10/31/2022. CHF, CAD; Risk Factors:Hypertension, Diabetes                 and Current Smoker.  Sonographer:    Ellouise Mose RDCS Referring Phys: [id number] VASUNDHRA RATHORE IMPRESSIONS  1. Left ventricular ejection fraction, by estimation, is 55 to 60%. The left ventricle has normal function. The left ventricle has no regional wall motion abnormalities. There is mild left ventricular hypertrophy. Left ventricular diastolic parameters are consistent with Grade I diastolic dysfunction (impaired relaxation). Elevated left atrial pressure.  2. Right ventricular systolic function is normal. The right ventricular size is normal. Tricuspid regurgitation signal is inadequate for assessing PA pressure.  3. The mitral valve is abnormal. Mild mitral valve regurgitation. No evidence of mitral stenosis.  4. The aortic valve is tricuspid. Aortic valve regurgitation is moderate. No aortic stenosis is present.  5. Aortic dilatation noted. There is mild dilatation of the ascending aorta, measuring 37 mm.  6. The inferior vena cava is normal  in size with greater than 50% respiratory variability, suggesting right atrial pressure of 3 mmHg. FINDINGS  Left Ventricle: Left ventricular ejection fraction, by estimation, is 55 to 60%. The left ventricle has normal function. The left ventricle has no regional  wall motion abnormalities. The left ventricular internal cavity size was normal in size. There is  mild left ventricular hypertrophy. Left ventricular diastolic parameters are consistent with Grade I diastolic dysfunction (impaired relaxation). Elevated left atrial pressure. Right Ventricle: The right ventricular size is normal. Right vetricular wall thickness was not well visualized. Right ventricular systolic function is normal. Tricuspid regurgitation signal is inadequate for assessing PA pressure. Left Atrium: Left atrial size was normal in size. Right Atrium: Right atrial size was normal in size. Pericardium: There is no evidence of pericardial effusion. Mitral Valve: The mitral valve is abnormal. Mild mitral valve regurgitation. No evidence of mitral valve stenosis. Tricuspid Valve: The tricuspid valve is normal in structure. Tricuspid valve regurgitation is not demonstrated. No evidence of tricuspid stenosis. Aortic Valve: The aortic valve is tricuspid. Aortic valve regurgitation is moderate. No aortic stenosis is present. Aortic valve mean gradient measures 3.3 mmHg. Aortic valve peak gradient measures 6.6 mmHg. Aortic valve area, by VTI measures 3.01 cm. Pulmonic Valve: The pulmonic valve was not well visualized. Pulmonic valve regurgitation is not visualized. No evidence of pulmonic stenosis. Aorta: The aortic root is normal in size and structure and aortic dilatation noted. There is mild dilatation of the ascending aorta, measuring 37 mm. Venous: The inferior vena cava is normal in size with greater than 50% respiratory variability, suggesting right atrial pressure of 3 mmHg. IAS/Shunts: No atrial level shunt detected by color flow Doppler.   LEFT VENTRICLE PLAX 2D LVIDd:         5.03 cm      Diastology LVIDs:         3.58 cm      LV e' medial:    4.57 cm/s LV PW:         1.16 cm      LV E/e' medial:  20.9 LV IVS:        1.34 cm      LV e' lateral:   6.74 cm/s LVOT diam:     2.23 cm      LV E/e' lateral: 14.1 LV SV:         81 LV SV Index:   39 LVOT Area:     3.91 cm  LV Volumes (MOD) LV vol d, MOD A2C: 119.0 ml LV vol d, MOD A4C: 105.0 ml LV vol s, MOD A2C: 68.8 ml LV vol s, MOD A4C: 46.1 ml LV SV MOD A2C:     50.2 ml LV SV MOD A4C:     105.0 ml LV SV MOD BP:      57.0 ml RIGHT VENTRICLE             IVC RV S prime:     10.00 cm/s  IVC diam: 1.84 cm TAPSE (M-mode): 1.6 cm LEFT ATRIUM             Index        RIGHT ATRIUM           Index LA diam:        3.74 cm 1.81 cm/m   RA Area:     11.10 cm LA Vol (A2C):   37.9 ml 18.38 ml/m  RA Volume:   21.20 ml  10.28 ml/m LA Vol (A4C):   47.1 ml 22.84 ml/m LA Biplane Vol: 42.0 ml 20.37 ml/m  AORTIC VALVE AV Area (Vmax):    2.92 cm AV Area (Vmean):   2.90 cm AV Area (VTI):     3.01 cm AV Vmax:  128.33 cm/s AV Vmean:          84.724 cm/s AV VTI:            0.268 m AV Peak Grad:      6.6 mmHg AV Mean Grad:      3.3 mmHg LVOT Vmax:         96.10 cm/s LVOT Vmean:        62.800 cm/s LVOT VTI:          0.207 m LVOT/AV VTI ratio: 0.77  AORTA Ao Root diam: 3.31 cm Ao Asc diam:  3.73 cm MITRAL VALVE MV Area (PHT): 4.15 cm    SHUNTS MV Decel Time: 183 msec    Systemic VTI:  0.21 m MV E velocity: 95.30 cm/s  Systemic Diam: 2.23 cm MV A velocity: 80.05 cm/s MV E/A ratio:  1.19 Dorn Ross MD Electronically signed by Dorn Ross MD Signature Date/Time: 04/18/2024/3:22:48 PM    Final    CT Angio Chest PE W and/or Wo Contrast Result Date: 04/17/2024 CLINICAL DATA:  Chest pain and shortness of breath. Cough. Clinical concern for pulmonary embolus. EXAM: CT ANGIOGRAPHY CHEST WITH CONTRAST TECHNIQUE: Multidetector CT imaging of the chest was performed using the standard protocol during bolus  administration of intravenous contrast. Multiplanar CT image reconstructions and MIPs were obtained to evaluate the vascular anatomy. RADIATION DOSE REDUCTION: This exam was performed according to the departmental dose-optimization program which includes automated exposure control, adjustment of the mA and/or kV according to patient size and/or use of iterative reconstruction technique. CONTRAST:  OMNIPAQUE  IOHEXOL  350 MG/ML SOLN COMPARISON:  CT chest 04/23/2022 FINDINGS: Cardiovascular: The heart size is normal. No substantial pericardial effusion. Coronary artery calcification is evident. Mild atherosclerotic calcification is noted in the wall of the thoracic aorta. There is no filling defect within the opacified pulmonary arteries to suggest the presence of an acute pulmonary embolus. Mediastinum/Nodes: No mediastinal lymphadenopathy. There is no hilar lymphadenopathy. The esophagus has normal imaging features. There is no axillary lymphadenopathy. Lungs/Pleura: Interlobular septal thickening noted bilaterally, more pronounced in the lung bases. There is associated patchy and nodular ground-glass opacity in the lower lungs bilaterally with small bilateral pleural effusions. 3 mm anterior right middle lobe nodule on 74/303 is stable since 04/23/2022. No followup imaging is recommended. Upper Abdomen: Reflux of contrast material into the IVC and hepatic veins suggests right heart dysfunction. Musculoskeletal: No worrisome lytic or sclerotic osseous abnormality. Review of the MIP images confirms the above findings. IMPRESSION: 1. No CT evidence for acute pulmonary embolus. 2. Interlobular septal thickening with associated patchy and nodular ground-glass opacity in the lower lungs bilaterally. Imaging features are most suggestive of pulmonary edema. 3. Small bilateral pleural effusions. 4. Reflux of contrast material into the IVC and hepatic veins suggests right heart dysfunction. 5.  Aortic Atherosclerosis  (ICD10-I70.0). Electronically Signed   By: Camellia Candle M.D.   On: 04/17/2024 14:27   DG Chest Port 1 View Result Date: 04/17/2024 EXAM: 1 VIEW(S) XRAY OF THE CHEST 04/17/2024 12:02:00 PM COMPARISON: Comparison to 07/30/2022. CLINICAL HISTORY: cp. Central chest pain, SHOB, cough since last night. Denies N/V/D or fevers. States had stent placed earlier this month. Pt appears SHOB when speaking. FINDINGS: LUNGS AND PLEURA: Mild diffuse interstitial densities are noted bilaterally consistent with pulmonary edema. No focal pulmonary opacity. No pleural effusion. No pneumothorax. HEART AND MEDIASTINUM: Stable cardiomediastinal silhouette. BONES AND SOFT TISSUES: No acute osseous abnormality. IMPRESSION: 1. Mild interstitial pulmonary edema. Electronically signed by: Lynwood Seip  MD 04/17/2024 12:31 PM EDT RP Workstation: [id number]    Microbiology: Results for orders placed or performed during the hospital encounter of 04/17/24  Resp panel by RT-PCR (RSV, Flu A&B, Covid) Anterior Nasal Swab     Status: None   Collection Time: 04/17/24 11:30 AM   Specimen: Anterior Nasal Swab  Result Value Ref Range Status   SARS Coronavirus 2 by RT PCR NEGATIVE NEGATIVE Final    Comment: (NOTE) SARS-CoV-2 target nucleic acids are NOT DETECTED.  The SARS-CoV-2 RNA is generally detectable in upper respiratory specimens during the acute phase of infection. The lowest concentration of SARS-CoV-2 viral copies this assay can detect is 138 copies/mL. A negative result does not preclude SARS-Cov-2 infection and should not be used as the sole basis for treatment or other patient management decisions. A negative result may occur with  improper specimen collection/handling, submission of specimen other than nasopharyngeal swab, presence of viral mutation(s) within the areas targeted by this assay, and inadequate number of viral copies(<138 copies/mL). A negative result must be combined with clinical observations, patient  history, and epidemiological information. The expected result is Negative.  Fact Sheet for Patients:  BloggerCourse.com  Fact Sheet for Healthcare Providers:  SeriousBroker.it  This test is no t yet approved or cleared by the [place]  FDA and  has been authorized for detection and/or diagnosis of SARS-CoV-2 by FDA under an Emergency Use Authorization (EUA). This EUA will remain  in effect (meaning this test can be used) for the duration of the COVID-19 declaration under Section 564(b)(1) of the Act, 21 U.S.C.section 360bbb-3(b)(1), unless the authorization is terminated  or revoked sooner.       Influenza A by PCR NEGATIVE NEGATIVE Final   Influenza B by PCR NEGATIVE NEGATIVE Final    Comment: (NOTE) The Xpert Xpress SARS-CoV-2/FLU/RSV plus assay is intended as an aid in the diagnosis of influenza from Nasopharyngeal swab specimens and should not be used as a sole basis for treatment. Nasal washings and aspirates are unacceptable for Xpert Xpress SARS-CoV-2/FLU/RSV testing.  Fact Sheet for Patients: BloggerCourse.com  Fact Sheet for Healthcare Providers: SeriousBroker.it  This test is not yet approved or cleared by the [place]  FDA and has been authorized for detection and/or diagnosis of SARS-CoV-2 by FDA under an Emergency Use Authorization (EUA). This EUA will remain in effect (meaning this test can be used) for the duration of the COVID-19 declaration under Section 564(b)(1) of the Act, 21 U.S.C. section 360bbb-3(b)(1), unless the authorization is terminated or revoked.     Resp Syncytial Virus by PCR NEGATIVE NEGATIVE Final    Comment: (NOTE) Fact Sheet for Patients: BloggerCourse.com  Fact Sheet for Healthcare Providers: SeriousBroker.it  This test is not yet approved or cleared by the [place]  FDA  and has been authorized for detection and/or diagnosis of SARS-CoV-2 by FDA under an Emergency Use Authorization (EUA). This EUA will remain in effect (meaning this test can be used) for the duration of the COVID-19 declaration under Section 564(b)(1) of the Act, 21 U.S.C. section 360bbb-3(b)(1), unless the authorization is terminated or revoked.  Performed at [facility], [address] [place], [place] [address]     Labs: CBC: Recent Labs  Lab 04/17/24 1130  WBC 5.2  NEUTROABS 2.3  HGB 12.4*  HCT 36.3*  MCV 92.1  PLT 254   Basic Metabolic Panel: Recent Labs  Lab 04/17/24 1130 04/18/24 0452 04/19/24 0440  NA 135 138 137  K 4.0 4.0 3.7  CL 102 105 103  CO2 22 23 25   GLUCOSE 133* 138* 126*  BUN 9 11 13   CREATININE 1.09 1.25* 1.06  CALCIUM  8.4* 8.4* 8.3*  MG  --   --  1.7   Liver Function Tests: Recent Labs  Lab 04/18/24 0452  AST 13*  ALT 7  ALKPHOS 65  BILITOT 0.5  PROT 6.0*  ALBUMIN 2.7*   CBG: Recent Labs  Lab 04/17/24 2309 04/18/24 0752 04/18/24 1204 04/18/24 1555  GLUCAP 127* 97 127* 146*    Discharge time spent: greater than 30 minutes.  Signed: Elidia Toribio Furnace, MD [facility] 04/19/2024

## 2024-04-19 NOTE — Discharge Summary (Addendum)
 Physician Discharge Summary   Patient: [name] MRN: [id number] DOB: 11/28/1965  Admit date:     04/17/2024  Discharge date: 04/19/24  Discharge Physician: Elidia Toribio Furnace   PCP: [facility]   Recommendations at discharge:    Patient was placed on higher dose of valsartan and metoprolol changes to carvedilol for better blood pressure control. Added furosemide  for diuresis Follow up renal function and electrolytes in 7 days as outpatient Follow up with Primary Care, in 7 to 10 days Follow up with Cardiology as scheduled.   Discharge Diagnoses: Principal Problem:   Acute on chronic diastolic heart failure (HCC) Active Problems:   CAD S/P percutaneous coronary angioplasty   HTN (hypertension)   Type 2 diabetes mellitus with hyperlipidemia (HCC)   Toe ulcer (HCC)   Chronic anemia   Chronic pain  Resolved Problems:   * No resolved hospital problems. [facility] Course: Mr. Tiger was admitted to the hospital with the working diagnosis of heart failure exacerbation   58 yo male with the past medical history of hypertension, hyperlipidemia, coronary artery disease, GERD and T2DM who presented with dyspnea.   Recent hospitalization at outside hospital 10/01 to 04/03/24 for NSTEMI, sp PCI to mid LAD. His echocardiogram had preserved LV systolic function.  At home patient developed dyspnea on exertion and orthopnea. The night prior to admission he had severe PND, and chest pain, prompting him to come to the ED.  On his initial physical examination his blood pressure was 189/114, HR 90, RR 25 and 02 saturation 92% Lungs with no wheezing or rhonchi, heart with S1 and S2 present with positive systolic murmur, abdomen with no distention, no lower extremity edema. Left great toe, plantar ulcerated wound.  Na 135, K 4.0 Cl 102 bicarbonate 22 glucose 133 bun 9 cr 1.0  BNP 2,144 High sensitive troponin 99, and 96  Wbc 5.2 hgb 12.4 plt 254   Influenza negative RSV  negative Sars covid 19 negative   Chest radiograph with cardiomegaly, bilateral hilar vascular congestion, bilateral cephalization of the vasculature, bilateral central interstitial infiltrates, predominant at the lower lobes.   CT chest with bilateral ground glass opacities, bilateral lower lobes interlobular septal thickening, with bilateral pleural effusions.  No CT evidence of pulmonary embolism.   EKG 88 bpm, normal axis, normal intervals, qtc 463, sinus rhythm with 1st degree AV block, no significant ST segment changes, negative T wave lead II, III, aVF.   Patient was placed on IV furosemide  with improvement in volume status. Echocardiogram with diastolic dysfunction.  Plan to follow up as outpatient.   Assessment and Plan: * Acute on chronic diastolic heart failure (HCC) Echocardiogram with preserved LV systolic function EF 55 to 60%, mild LVH, grade I diastolic dysfunction with impaired relaxation, RV systolic function preserved, mild mitral valve regurgitation, moderate aortic valve regurgitation, mild dilatation of aortic root 37 mm,   Patient was placed on IV furosemide  for diuresis, negative fluid balance was achieved, with improvement in his symptoms.   Plan to continue carvedilol and increased dose of valsartan for better blood pressure control.    CAD S/P percutaneous coronary angioplasty No acute coronary syndrome, high sensitive troponin elevation due to heart failure.  Continue with ticagrelor and aspirin  for dual antiplatelet therapy Continue as needed nitroglycerin.   HTN (hypertension) Continue blood pressure control with carvedilol and valsartan. Follow up as outpatient.   Type 2 diabetes mellitus with hyperlipidemia (HCC) Patient was placed on insulin  sliding scale for glucose cover and  monitoring. Glucose remained stable.   Continue statin   Toe ulcer (HCC) Continue local wound care.  Continue with doxycyline   Chronic anemia Cell count has been  stable.   GERD continue with proton pump inhibitor   Chronic pain Continue home regimen with oxycodone , acetaminophen  and gabapentin.    Consultants: Cardiology  Procedures performed: none   Disposition: Home Diet recommendation:  Cardiac diet DISCHARGE MEDICATION: Allergies as of 04/19/2024       Reactions   Lisinopril Other (See Comments), Hives   Other reaction(s): Electrocardiogram abnormal Other Reaction(s): Electrocardiogram abnormal, Abdominal pain, Electrocardiogram abnormal, Abdominal pain        Medication List     STOP taking these medications    AMLODIPINE BESYLATE PO   Cholecalciferol 25 MCG (1000 UT) capsule   DSS 100 MG Caps   escitalopram 10 MG tablet Commonly known as: LEXAPRO   hydrOXYzine 25 MG capsule Commonly known as: VISTARIL   ibuprofen 800 MG tablet Commonly known as: ADVIL   loratadine  10 MG tablet Commonly known as: CLARITIN    losartan 100 MG tablet Commonly known as: COZAAR   metFORMIN  500 MG tablet Commonly known as: GLUCOPHAGE    methocarbamol 750 MG tablet Commonly known as: ROBAXIN   naloxone 4 MG/0.1ML Liqd nasal spray kit Commonly known as: NARCAN   naproxen 500 MG tablet Commonly known as: NAPROSYN   OZEMPIC (1 MG/DOSE) [place]   polyethylene glycol 17 g packet Commonly known as: MIRALAX  / GLYCOLAX    ranitidine  75 MG tablet Commonly known as: ZANTAC    rosuvastatin  10 MG tablet Commonly known as: CRESTOR    sertraline 25 MG tablet Commonly known as: ZOLOFT   spironolactone 25 MG tablet Commonly known as: ALDACTONE   testosterone cypionate 100 MG/ML injection Commonly known as: DEPOTESTOTERONE CYPIONATE   triamcinolone cream 0.1 % Commonly known as: KENALOG   zolpidem 10 MG tablet Commonly known as: AMBIEN       TAKE these medications    aspirin  EC 81 MG tablet Take 1 tablet (81 mg total) by mouth daily. Swallow whole. Start taking on: April 20, 2024   atorvastatin 40 MG tablet Commonly known  as: LIPITOR Take 1 tablet (40 mg total) by mouth at bedtime.   carvedilol 12.5 MG tablet Commonly known as: COREG Take 1 tablet (12.5 mg total) by mouth 2 (two) times daily with a meal.   doxycycline  100 MG capsule Commonly known as: VIBRAMYCIN  Take 1 capsule (100 mg total) by mouth 2 (two) times daily.   furosemide  20 MG tablet Commonly known as: LASIX  Take 1 tablet (20 mg total) by mouth daily.   gabapentin 800 MG tablet Commonly known as: NEURONTIN Take 800 mg by mouth QID.   nitroGLYCERIN 0.4 MG SL tablet Commonly known as: NITROSTAT Place 1 tablet (0.4 mg total) under the tongue every 5 (five) minutes as needed for chest pain.   omeprazole 40 MG capsule Commonly known as: PRILOSEC Take 40 mg by mouth daily.   oxyCODONE -acetaminophen  10-325 MG tablet Commonly known as: PERCOCET Take 1 tablet by mouth every 4 (four) hours as needed for pain. 1 q 6 hours and 1 at bedtime   ticagrelor 90 MG Tabs tablet Commonly known as: BRILINTA Take 1 tablet (90 mg total) by mouth 2 (two) times daily.   valsartan 160 MG tablet Commonly known as: Diovan Take 1 tablet (160 mg total) by mouth daily. Start taking on: April 20, 2024        Discharge Exam: Fredricka Weights  04/17/24 2012 04/18/24 0437 04/19/24 0552  Weight: 90.3 kg 90.3 kg 90.1 kg   BP (!) 159/87 (BP Location: Left Arm)   Pulse 80   Temp 98 F (36.7 C) (Oral)   Resp 18   Ht 5' 9 (1.753 m)   Wt 90.1 kg   SpO2 95%   BMI 29.34 kg/m    Patient with no chest pain or dyspnea, no PND, orthopnea or lower extremity edema   Neurology awake and alert ENT with mild pallor  Cardiovascular with S1 and S2 present and regular with no gallops rubs or murmurs No JVD Respiratory with no rales or wheezing, no rhonchi  Abdomen with no distention  No lower extremity edema  Left foot first toe ulcerated wound no erythema or drainage.     Condition at discharge: stable  The results of significant diagnostics from  this hospitalization (including imaging, microbiology, ancillary and laboratory) are listed below for reference.   Imaging Studies: ECHOCARDIOGRAM COMPLETE Result Date: 04/18/2024    ECHOCARDIOGRAM REPORT   Patient Name:   MIGEL HANNIS Date of Exam: 04/18/2024 Medical Rec #:  [id number]    Height:       69.0 in Accession #:    [id number]   Weight:       199.1 lb Date of Birth:  08-Mar-1966   BSA:          2.062 m Patient Age:    57 years     BP:           156/87 mmHg Patient Gender: M            HR:           75 bpm. Exam Location:  Inpatient Procedure: 2D Echo, Cardiac Doppler and Color Doppler (Both Spectral and Color            Flow Doppler were utilized during procedure). Indications:    I50.40* Unspecified combined systolic (congestive) and diastolic                 (congestive) heart failure  History:        Patient has prior history of Echocardiogram examinations, most                 recent 10/31/2022. CHF, CAD; Risk Factors:Hypertension, Diabetes                 and Current Smoker.  Sonographer:    Ellouise Mose RDCS Referring Phys: [id number] VASUNDHRA RATHORE IMPRESSIONS  1. Left ventricular ejection fraction, by estimation, is 55 to 60%. The left ventricle has normal function. The left ventricle has no regional wall motion abnormalities. There is mild left ventricular hypertrophy. Left ventricular diastolic parameters are consistent with Grade I diastolic dysfunction (impaired relaxation). Elevated left atrial pressure.  2. Right ventricular systolic function is normal. The right ventricular size is normal. Tricuspid regurgitation signal is inadequate for assessing PA pressure.  3. The mitral valve is abnormal. Mild mitral valve regurgitation. No evidence of mitral stenosis.  4. The aortic valve is tricuspid. Aortic valve regurgitation is moderate. No aortic stenosis is present.  5. Aortic dilatation noted. There is mild dilatation of the ascending aorta, measuring 37 mm.  6. The inferior vena cava is normal  in size with greater than 50% respiratory variability, suggesting right atrial pressure of 3 mmHg. FINDINGS  Left Ventricle: Left ventricular ejection fraction, by estimation, is 55 to 60%. The left ventricle has normal function. The left ventricle has no regional  wall motion abnormalities. The left ventricular internal cavity size was normal in size. There is  mild left ventricular hypertrophy. Left ventricular diastolic parameters are consistent with Grade I diastolic dysfunction (impaired relaxation). Elevated left atrial pressure. Right Ventricle: The right ventricular size is normal. Right vetricular wall thickness was not well visualized. Right ventricular systolic function is normal. Tricuspid regurgitation signal is inadequate for assessing PA pressure. Left Atrium: Left atrial size was normal in size. Right Atrium: Right atrial size was normal in size. Pericardium: There is no evidence of pericardial effusion. Mitral Valve: The mitral valve is abnormal. Mild mitral valve regurgitation. No evidence of mitral valve stenosis. Tricuspid Valve: The tricuspid valve is normal in structure. Tricuspid valve regurgitation is not demonstrated. No evidence of tricuspid stenosis. Aortic Valve: The aortic valve is tricuspid. Aortic valve regurgitation is moderate. No aortic stenosis is present. Aortic valve mean gradient measures 3.3 mmHg. Aortic valve peak gradient measures 6.6 mmHg. Aortic valve area, by VTI measures 3.01 cm. Pulmonic Valve: The pulmonic valve was not well visualized. Pulmonic valve regurgitation is not visualized. No evidence of pulmonic stenosis. Aorta: The aortic root is normal in size and structure and aortic dilatation noted. There is mild dilatation of the ascending aorta, measuring 37 mm. Venous: The inferior vena cava is normal in size with greater than 50% respiratory variability, suggesting right atrial pressure of 3 mmHg. IAS/Shunts: No atrial level shunt detected by color flow Doppler.   LEFT VENTRICLE PLAX 2D LVIDd:         5.03 cm      Diastology LVIDs:         3.58 cm      LV e' medial:    4.57 cm/s LV PW:         1.16 cm      LV E/e' medial:  20.9 LV IVS:        1.34 cm      LV e' lateral:   6.74 cm/s LVOT diam:     2.23 cm      LV E/e' lateral: 14.1 LV SV:         81 LV SV Index:   39 LVOT Area:     3.91 cm  LV Volumes (MOD) LV vol d, MOD A2C: 119.0 ml LV vol d, MOD A4C: 105.0 ml LV vol s, MOD A2C: 68.8 ml LV vol s, MOD A4C: 46.1 ml LV SV MOD A2C:     50.2 ml LV SV MOD A4C:     105.0 ml LV SV MOD BP:      57.0 ml RIGHT VENTRICLE             IVC RV S prime:     10.00 cm/s  IVC diam: 1.84 cm TAPSE (M-mode): 1.6 cm LEFT ATRIUM             Index        RIGHT ATRIUM           Index LA diam:        3.74 cm 1.81 cm/m   RA Area:     11.10 cm LA Vol (A2C):   37.9 ml 18.38 ml/m  RA Volume:   21.20 ml  10.28 ml/m LA Vol (A4C):   47.1 ml 22.84 ml/m LA Biplane Vol: 42.0 ml 20.37 ml/m  AORTIC VALVE AV Area (Vmax):    2.92 cm AV Area (Vmean):   2.90 cm AV Area (VTI):     3.01 cm AV Vmax:  128.33 cm/s AV Vmean:          84.724 cm/s AV VTI:            0.268 m AV Peak Grad:      6.6 mmHg AV Mean Grad:      3.3 mmHg LVOT Vmax:         96.10 cm/s LVOT Vmean:        62.800 cm/s LVOT VTI:          0.207 m LVOT/AV VTI ratio: 0.77  AORTA Ao Root diam: 3.31 cm Ao Asc diam:  3.73 cm MITRAL VALVE MV Area (PHT): 4.15 cm    SHUNTS MV Decel Time: 183 msec    Systemic VTI:  0.21 m MV E velocity: 95.30 cm/s  Systemic Diam: 2.23 cm MV A velocity: 80.05 cm/s MV E/A ratio:  1.19 Dorn Ross MD Electronically signed by Dorn Ross MD Signature Date/Time: 04/18/2024/3:22:48 PM    Final    CT Angio Chest PE W and/or Wo Contrast Result Date: 04/17/2024 CLINICAL DATA:  Chest pain and shortness of breath. Cough. Clinical concern for pulmonary embolus. EXAM: CT ANGIOGRAPHY CHEST WITH CONTRAST TECHNIQUE: Multidetector CT imaging of the chest was performed using the standard protocol during bolus  administration of intravenous contrast. Multiplanar CT image reconstructions and MIPs were obtained to evaluate the vascular anatomy. RADIATION DOSE REDUCTION: This exam was performed according to the departmental dose-optimization program which includes automated exposure control, adjustment of the mA and/or kV according to patient size and/or use of iterative reconstruction technique. CONTRAST:  OMNIPAQUE  IOHEXOL  350 MG/ML SOLN COMPARISON:  CT chest 04/23/2022 FINDINGS: Cardiovascular: The heart size is normal. No substantial pericardial effusion. Coronary artery calcification is evident. Mild atherosclerotic calcification is noted in the wall of the thoracic aorta. There is no filling defect within the opacified pulmonary arteries to suggest the presence of an acute pulmonary embolus. Mediastinum/Nodes: No mediastinal lymphadenopathy. There is no hilar lymphadenopathy. The esophagus has normal imaging features. There is no axillary lymphadenopathy. Lungs/Pleura: Interlobular septal thickening noted bilaterally, more pronounced in the lung bases. There is associated patchy and nodular ground-glass opacity in the lower lungs bilaterally with small bilateral pleural effusions. 3 mm anterior right middle lobe nodule on 74/303 is stable since 04/23/2022. No followup imaging is recommended. Upper Abdomen: Reflux of contrast material into the IVC and hepatic veins suggests right heart dysfunction. Musculoskeletal: No worrisome lytic or sclerotic osseous abnormality. Review of the MIP images confirms the above findings. IMPRESSION: 1. No CT evidence for acute pulmonary embolus. 2. Interlobular septal thickening with associated patchy and nodular ground-glass opacity in the lower lungs bilaterally. Imaging features are most suggestive of pulmonary edema. 3. Small bilateral pleural effusions. 4. Reflux of contrast material into the IVC and hepatic veins suggests right heart dysfunction. 5.  Aortic Atherosclerosis  (ICD10-I70.0). Electronically Signed   By: Camellia Candle M.D.   On: 04/17/2024 14:27   DG Chest Port 1 View Result Date: 04/17/2024 EXAM: 1 VIEW(S) XRAY OF THE CHEST 04/17/2024 12:02:00 PM COMPARISON: Comparison to 07/30/2022. CLINICAL HISTORY: cp. Central chest pain, SHOB, cough since last night. Denies N/V/D or fevers. States had stent placed earlier this month. Pt appears SHOB when speaking. FINDINGS: LUNGS AND PLEURA: Mild diffuse interstitial densities are noted bilaterally consistent with pulmonary edema. No focal pulmonary opacity. No pleural effusion. No pneumothorax. HEART AND MEDIASTINUM: Stable cardiomediastinal silhouette. BONES AND SOFT TISSUES: No acute osseous abnormality. IMPRESSION: 1. Mild interstitial pulmonary edema. Electronically signed by: Lynwood Seip  MD 04/17/2024 12:31 PM EDT RP Workstation: [id number]    Microbiology: Results for orders placed or performed during the hospital encounter of 04/17/24  Resp panel by RT-PCR (RSV, Flu A&B, Covid) Anterior Nasal Swab     Status: None   Collection Time: 04/17/24 11:30 AM   Specimen: Anterior Nasal Swab  Result Value Ref Range Status   SARS Coronavirus 2 by RT PCR NEGATIVE NEGATIVE Final    Comment: (NOTE) SARS-CoV-2 target nucleic acids are NOT DETECTED.  The SARS-CoV-2 RNA is generally detectable in upper respiratory specimens during the acute phase of infection. The lowest concentration of SARS-CoV-2 viral copies this assay can detect is 138 copies/mL. A negative result does not preclude SARS-Cov-2 infection and should not be used as the sole basis for treatment or other patient management decisions. A negative result may occur with  improper specimen collection/handling, submission of specimen other than nasopharyngeal swab, presence of viral mutation(s) within the areas targeted by this assay, and inadequate number of viral copies(<138 copies/mL). A negative result must be combined with clinical observations, patient  history, and epidemiological information. The expected result is Negative.  Fact Sheet for Patients:  BloggerCourse.com  Fact Sheet for Healthcare Providers:  SeriousBroker.it  This test is no t yet approved or cleared by the [place]  FDA and  has been authorized for detection and/or diagnosis of SARS-CoV-2 by FDA under an Emergency Use Authorization (EUA). This EUA will remain  in effect (meaning this test can be used) for the duration of the COVID-19 declaration under Section 564(b)(1) of the Act, 21 U.S.C.section 360bbb-3(b)(1), unless the authorization is terminated  or revoked sooner.       Influenza A by PCR NEGATIVE NEGATIVE Final   Influenza B by PCR NEGATIVE NEGATIVE Final    Comment: (NOTE) The Xpert Xpress SARS-CoV-2/FLU/RSV plus assay is intended as an aid in the diagnosis of influenza from Nasopharyngeal swab specimens and should not be used as a sole basis for treatment. Nasal washings and aspirates are unacceptable for Xpert Xpress SARS-CoV-2/FLU/RSV testing.  Fact Sheet for Patients: BloggerCourse.com  Fact Sheet for Healthcare Providers: SeriousBroker.it  This test is not yet approved or cleared by the [place]  FDA and has been authorized for detection and/or diagnosis of SARS-CoV-2 by FDA under an Emergency Use Authorization (EUA). This EUA will remain in effect (meaning this test can be used) for the duration of the COVID-19 declaration under Section 564(b)(1) of the Act, 21 U.S.C. section 360bbb-3(b)(1), unless the authorization is terminated or revoked.     Resp Syncytial Virus by PCR NEGATIVE NEGATIVE Final    Comment: (NOTE) Fact Sheet for Patients: BloggerCourse.com  Fact Sheet for Healthcare Providers: SeriousBroker.it  This test is not yet approved or cleared by the [place]  FDA  and has been authorized for detection and/or diagnosis of SARS-CoV-2 by FDA under an Emergency Use Authorization (EUA). This EUA will remain in effect (meaning this test can be used) for the duration of the COVID-19 declaration under Section 564(b)(1) of the Act, 21 U.S.C. section 360bbb-3(b)(1), unless the authorization is terminated or revoked.  Performed at [facility], [address] [place], [place] [address]     Labs: CBC: Recent Labs  Lab 04/17/24 1130  WBC 5.2  NEUTROABS 2.3  HGB 12.4*  HCT 36.3*  MCV 92.1  PLT 254   Basic Metabolic Panel: Recent Labs  Lab 04/17/24 1130 04/18/24 0452 04/19/24 0440  NA 135 138 137  K 4.0 4.0 3.7  CL 102 105 103  CO2 22 23 25   GLUCOSE 133* 138* 126*  BUN 9 11 13   CREATININE 1.09 1.25* 1.06  CALCIUM  8.4* 8.4* 8.3*  MG  --   --  1.7   Liver Function Tests: Recent Labs  Lab 04/18/24 0452  AST 13*  ALT 7  ALKPHOS 65  BILITOT 0.5  PROT 6.0*  ALBUMIN 2.7*   CBG: Recent Labs  Lab 04/17/24 2309 04/18/24 0752 04/18/24 1204 04/18/24 1555  GLUCAP 127* 97 127* 146*    Discharge time spent: greater than 30 minutes.  Signed: Elidia Toribio Furnace, MD [facility] 04/19/2024

## 2024-04-19 NOTE — Progress Notes (Signed)
 Patient successfully ambulated independently in hallway.

## 2024-04-19 NOTE — Progress Notes (Signed)
 Rounding Note   Patient Name: [name] Date of Encounter: 04/19/2024  [facility] Cardiologist: Tyna Englevale  Subjective No complaints  Scheduled Meds:  amLODipine  5 mg Oral Daily   aspirin  EC  81 mg Oral Daily   atorvastatin  40 mg Oral QHS   carvedilol  12.5 mg Oral BID WC   doxycycline   100 mg Oral Q12H   enoxaparin (LOVENOX) injection  40 mg Subcutaneous QHS   gabapentin  800 mg Oral QID   irbesartan  75 mg Oral Daily   pantoprazole   40 mg Oral Daily   ticagrelor  90 mg Oral BID   Continuous Infusions:  PRN Meds: acetaminophen , oxyCODONE    Vital Signs  Vitals:   04/18/24 2349 04/19/24 0424 04/19/24 0552 04/19/24 0819  BP: (!) 142/90 (!) 151/90  (!) 161/94  Pulse: 78 79    Resp: 17 18  18   Temp: 98.1 F (36.7 C) 98.1 F (36.7 C)  98.1 F (36.7 C)  TempSrc: Oral Oral  Oral  SpO2: 95% 98%    Weight:   90.1 kg   Height:        Intake/Output Summary (Last 24 hours) at 04/19/2024 1011 Last data filed at 04/18/2024 1826 Gross per 24 hour  Intake 966 ml  Output 550 ml  Net 416 ml      04/19/2024    5:52 AM 04/18/2024    4:37 AM 04/17/2024    8:12 PM  Last 3 Weights  Weight (lbs) 198 lb 11.2 oz 199 lb 1.2 oz 199 lb 1.6 oz  Weight (kg) 90.13 kg 90.3 kg 90.311 kg      Telemetry NSR - Personally Reviewed  ECG  N/a - Personally Reviewed  Physical Exam  GEN: No acute distress.   Neck: No JVD Cardiac: RRR, no murmurs, rubs, or gallops.  Respiratory: Clear to auscultation bilaterally. GI: Soft, nontender, non-distended  MS: No edema; No deformity. Neuro:  Nonfocal  Psych: Normal affect   Labs High Sensitivity Troponin:  No results for input(s): TROPONINIHS in the last 720 hours.   Chemistry Recent Labs  Lab 04/17/24 1130 04/18/24 0452 04/19/24 0440  NA 135 138 137  K 4.0 4.0 3.7  CL 102 105 103  CO2 22 23 25   GLUCOSE 133* 138* 126*  BUN 9 11 13   CREATININE 1.09 1.25* 1.06  CALCIUM  8.4* 8.4* 8.3*  MG  --   --  1.7   PROT  --  6.0*  --   ALBUMIN  --  2.7*  --   AST  --  13*  --   ALT  --  7  --   ALKPHOS  --  65  --   BILITOT  --  0.5  --   GFRNONAA >60 >60 >60  ANIONGAP 11 10 9     Lipids No results for input(s): CHOL, TRIG, HDL, LABVLDL, LDLCALC, CHOLHDL in the last 168 hours.  Hematology Recent Labs  Lab 04/17/24 1130  WBC 5.2  RBC 3.94*  HGB 12.4*  HCT 36.3*  MCV 92.1  MCH 31.5  MCHC 34.2  RDW 13.2  PLT 254   Thyroid No results for input(s): TSH, FREET4 in the last 168 hours.  BNP Recent Labs  Lab 04/17/24 1130  PROBNP 2,144.0*    DDimer No results for input(s): DDIMER in the last 168 hours.   Radiology  ECHOCARDIOGRAM COMPLETE Result Date: 04/18/2024    ECHOCARDIOGRAM REPORT   Patient Name:   [name] Date of  Exam: 04/18/2024 Medical Rec #:  [id number]    Height:       69.0 in Accession #:    [id number]   Weight:       199.1 lb Date of Birth:  [birth year]   BSA:          2.062 m Patient Age:    57 years     BP:           156/87 mmHg Patient Gender: M            HR:           75 bpm. Exam Location:  Inpatient Procedure: 2D Echo, Cardiac Doppler and Color Doppler (Both Spectral and Color            Flow Doppler were utilized during procedure). Indications:    I50.40* Unspecified combined systolic (congestive) and diastolic                 (congestive) heart failure  History:        Patient has prior history of Echocardiogram examinations, most                 recent 10/31/2022. CHF, CAD; Risk Factors:Hypertension, Diabetes                 and Current Smoker.  Sonographer:    Ellouise Mose RDCS Referring Phys: [id number] VASUNDHRA RATHORE IMPRESSIONS  1. Left ventricular ejection fraction, by estimation, is 55 to 60%. The left ventricle has normal function. The left ventricle has no regional wall motion abnormalities. There is mild left ventricular hypertrophy. Left ventricular diastolic parameters are consistent with Grade I diastolic dysfunction (impaired relaxation). Elevated  left atrial pressure.  2. Right ventricular systolic function is normal. The right ventricular size is normal. Tricuspid regurgitation signal is inadequate for assessing PA pressure.  3. The mitral valve is abnormal. Mild mitral valve regurgitation. No evidence of mitral stenosis.  4. The aortic valve is tricuspid. Aortic valve regurgitation is moderate. No aortic stenosis is present.  5. Aortic dilatation noted. There is mild dilatation of the ascending aorta, measuring 37 mm.  6. The inferior vena cava is normal in size with greater than 50% respiratory variability, suggesting right atrial pressure of 3 mmHg. FINDINGS  Left Ventricle: Left ventricular ejection fraction, by estimation, is 55 to 60%. The left ventricle has normal function. The left ventricle has no regional wall motion abnormalities. The left ventricular internal cavity size was normal in size. There is  mild left ventricular hypertrophy. Left ventricular diastolic parameters are consistent with Grade I diastolic dysfunction (impaired relaxation). Elevated left atrial pressure. Right Ventricle: The right ventricular size is normal. Right vetricular wall thickness was not well visualized. Right ventricular systolic function is normal. Tricuspid regurgitation signal is inadequate for assessing PA pressure. Left Atrium: Left atrial size was normal in size. Right Atrium: Right atrial size was normal in size. Pericardium: There is no evidence of pericardial effusion. Mitral Valve: The mitral valve is abnormal. Mild mitral valve regurgitation. No evidence of mitral valve stenosis. Tricuspid Valve: The tricuspid valve is normal in structure. Tricuspid valve regurgitation is not demonstrated. No evidence of tricuspid stenosis. Aortic Valve: The aortic valve is tricuspid. Aortic valve regurgitation is moderate. No aortic stenosis is present. Aortic valve mean gradient measures 3.3 mmHg. Aortic valve peak gradient measures 6.6 mmHg. Aortic valve area, by VTI  measures 3.01 cm. Pulmonic Valve: The pulmonic valve was not well visualized. Pulmonic valve regurgitation is not visualized.  No evidence of pulmonic stenosis. Aorta: The aortic root is normal in size and structure and aortic dilatation noted. There is mild dilatation of the ascending aorta, measuring 37 mm. Venous: The inferior vena cava is normal in size with greater than 50% respiratory variability, suggesting right atrial pressure of 3 mmHg. IAS/Shunts: No atrial level shunt detected by color flow Doppler.  LEFT VENTRICLE PLAX 2D LVIDd:         5.03 cm      Diastology LVIDs:         3.58 cm      LV e' medial:    4.57 cm/s LV PW:         1.16 cm      LV E/e' medial:  20.9 LV IVS:        1.34 cm      LV e' lateral:   6.74 cm/s LVOT diam:     2.23 cm      LV E/e' lateral: 14.1 LV SV:         81 LV SV Index:   39 LVOT Area:     3.91 cm  LV Volumes (MOD) LV vol d, MOD A2C: 119.0 ml LV vol d, MOD A4C: 105.0 ml LV vol s, MOD A2C: 68.8 ml LV vol s, MOD A4C: 46.1 ml LV SV MOD A2C:     50.2 ml LV SV MOD A4C:     105.0 ml LV SV MOD BP:      57.0 ml RIGHT VENTRICLE             IVC RV S prime:     10.00 cm/s  IVC diam: 1.84 cm TAPSE (M-mode): 1.6 cm LEFT ATRIUM             Index        RIGHT ATRIUM           Index LA diam:        3.74 cm 1.81 cm/m   RA Area:     11.10 cm LA Vol (A2C):   37.9 ml 18.38 ml/m  RA Volume:   21.20 ml  10.28 ml/m LA Vol (A4C):   47.1 ml 22.84 ml/m LA Biplane Vol: 42.0 ml 20.37 ml/m  AORTIC VALVE AV Area (Vmax):    2.92 cm AV Area (Vmean):   2.90 cm AV Area (VTI):     3.01 cm AV Vmax:           128.33 cm/s AV Vmean:          84.724 cm/s AV VTI:            0.268 m AV Peak Grad:      6.6 mmHg AV Mean Grad:      3.3 mmHg LVOT Vmax:         96.10 cm/s LVOT Vmean:        62.800 cm/s LVOT VTI:          0.207 m LVOT/AV VTI ratio: 0.77  AORTA Ao Root diam: 3.31 cm Ao Asc diam:  3.73 cm MITRAL VALVE MV Area (PHT): 4.15 cm    SHUNTS MV Decel Time: 183 msec    Systemic VTI:  0.21 m MV E velocity:  95.30 cm/s  Systemic Diam: 2.23 cm MV A velocity: 80.05 cm/s MV E/A ratio:  1.19 Dorn Ross MD Electronically signed by Dorn Ross MD Signature Date/Time: 04/18/2024/3:22:48 PM    Final    CT Angio Chest PE W and/or Wo Contrast Result  Date: 04/17/2024 CLINICAL DATA:  Chest pain and shortness of breath. Cough. Clinical concern for pulmonary embolus. EXAM: CT ANGIOGRAPHY CHEST WITH CONTRAST TECHNIQUE: Multidetector CT imaging of the chest was performed using the standard protocol during bolus administration of intravenous contrast. Multiplanar CT image reconstructions and MIPs were obtained to evaluate the vascular anatomy. RADIATION DOSE REDUCTION: This exam was performed according to the departmental dose-optimization program which includes automated exposure control, adjustment of the mA and/or kV according to patient size and/or use of iterative reconstruction technique. CONTRAST:  OMNIPAQUE  IOHEXOL  350 MG/ML SOLN COMPARISON:  CT chest 04/23/2022 FINDINGS: Cardiovascular: The heart size is normal. No substantial pericardial effusion. Coronary artery calcification is evident. Mild atherosclerotic calcification is noted in the wall of the thoracic aorta. There is no filling defect within the opacified pulmonary arteries to suggest the presence of an acute pulmonary embolus. Mediastinum/Nodes: No mediastinal lymphadenopathy. There is no hilar lymphadenopathy. The esophagus has normal imaging features. There is no axillary lymphadenopathy. Lungs/Pleura: Interlobular septal thickening noted bilaterally, more pronounced in the lung bases. There is associated patchy and nodular ground-glass opacity in the lower lungs bilaterally with small bilateral pleural effusions. 3 mm anterior right middle lobe nodule on 74/303 is stable since 04/23/2022. No followup imaging is recommended. Upper Abdomen: Reflux of contrast material into the IVC and hepatic veins suggests right heart dysfunction. Musculoskeletal:  No worrisome lytic or sclerotic osseous abnormality. Review of the MIP images confirms the above findings. IMPRESSION: 1. No CT evidence for acute pulmonary embolus. 2. Interlobular septal thickening with associated patchy and nodular ground-glass opacity in the lower lungs bilaterally. Imaging features are most suggestive of pulmonary edema. 3. Small bilateral pleural effusions. 4. Reflux of contrast material into the IVC and hepatic veins suggests right heart dysfunction. 5.  Aortic Atherosclerosis (ICD10-I70.0). Electronically Signed   By: Camellia Candle M.D.   On: 04/17/2024 14:27   DG Chest Port 1 View Result Date: 04/17/2024 EXAM: 1 VIEW(S) XRAY OF THE CHEST 04/17/2024 12:02:00 PM COMPARISON: Comparison to 07/30/2022. CLINICAL HISTORY: cp. Central chest pain, SHOB, cough since last night. Denies N/V/D or fevers. States had stent placed earlier this month. Pt appears SHOB when speaking. FINDINGS: LUNGS AND PLEURA: Mild diffuse interstitial densities are noted bilaterally consistent with pulmonary edema. No focal pulmonary opacity. No pleural effusion. No pneumothorax. HEART AND MEDIASTINUM: Stable cardiomediastinal silhouette. BONES AND SOFT TISSUES: No acute osseous abnormality. IMPRESSION: 1. Mild interstitial pulmonary edema. Electronically signed by: Lynwood Seip MD 04/17/2024 12:31 PM EDT RP Workstation: [id number]      Patient Profile   [name] is a 58 y.o. male with a hx of recent NSTEMI who is being seen 04/17/2024 for the evaluation of SOB at the request of hospitalist service.   Assessment & Plan   1.Hypertensive urgency - SBP to 180s on presentation - metoprolol changed to  coreg 12.5 mg bid for better bp effect., irbesartan 75mg  daily (was on valsartan 40mg  bid at home, changed for formulary reasons). Norvasc 5mg  daily added - he reports changes made during his recent admission and feels caused elevations in bp's, reports his norvasc and aldactone were stopped  - bp's remain  elevated at times. Increase irbesartan to 150mg  daily, at discharge would use his home valsartan at higher dose of 160mg  daily.    2. Acute HFpEF - proBNP 2100, CXR with pulm edema and bilateral effusions - likely exacerbated by HTN, SBP in the 180s - 10/2022 echo: LVEF 60-65%, no WMAs, grade I dd, normal RV -  04/02/2024 echo OSH: LVEF 50-55%, normal diastolic function, mod to severe AI - 04/18/24 echo: LVEF 55-60%, no WMAs, grade I dd, mod AI   - received IV lasix  40mg  x 2 yesterday. I/Os indicate neg 1.5L. Uptrend in Cr, diuretics stopped.  - start oral lasix  20mg  daily.    3. CAD - admit 04/2024 to OSH (records in care everywhere) with NSTEMI - DES to LAD, CTO of distal RCA, otherwise moderate disease - 04/2024 echo during admission: LVEF 50-55%, mod to severe AI - medical therapy with ASA 81, brillinta 90mg  bid, atorva 40, irbesartan 250mg , coreg 12.5mg  bid.    Follow bp into the afternoon, possible d/c later today. Could adjust bp meds further as outpatient.    For questions or updates, please contact [facility] Please consult [email or web address] for contact info under       Signed, Alvan Carrier, MD  04/19/2024, 10:11 AM

## 2024-04-20 NOTE — Telephone Encounter (Signed)
"  Called and spoke with patient. He just needs a note for being out on 04/08/24. He wants to keep follow up for 04/29/24 as well because he was in the hospital with [facility] last week.  "

## 2024-04-20 NOTE — Telephone Encounter (Signed)
"  Patient called stating he has the following request :    Recently hospitalized  Hospital F/U sch for 10/29  Needs a Back to Work Letter to take back after appt.    Please call and advise.  "

## 2024-04-21 ENCOUNTER — Encounter (HOSPITAL_BASED_OUTPATIENT_CLINIC_OR_DEPARTMENT_OTHER): Admitting: Internal Medicine

## 2024-04-21 DIAGNOSIS — E11621 Type 2 diabetes mellitus with foot ulcer: Secondary | ICD-10-CM | POA: Diagnosis not present

## 2024-04-21 DIAGNOSIS — L97522 Non-pressure chronic ulcer of other part of left foot with fat layer exposed: Secondary | ICD-10-CM

## 2024-04-22 ENCOUNTER — Ambulatory Visit (HOSPITAL_BASED_OUTPATIENT_CLINIC_OR_DEPARTMENT_OTHER): Admitting: General Surgery

## 2024-04-29 ENCOUNTER — Ambulatory Visit: Admit: 2024-04-29 | Discharge: 2024-04-29 | Payer: 59

## 2024-04-29 NOTE — Progress Notes (Signed)
 "          [facility]  [address], [address]  [place], [place] [address]  PHONE: [phone]      04/29/24    NAME:  [name] [name]  DOB: 08-Nov-1965  MRN: [id number]         SUBJECTIVE:   [name] [name] is a 58 y.o. male seen for a follow up visit regarding the following:     Chief Complaint   Patient presents with    Congestive Heart Failure    Follow-Up from Hospital            HPI:  Follow up  Congestive Heart Failure and Follow-Up from Hospital   .    [name] [name] presents today for hospital follow-up.  Was admitted In [place]  for diastolic heart failure.  Treated with IV Lasix , antihypertensive medications were uptitrated.  Discharged home, feels much better since then.  His breathing is closer to normal.  No chest pain or pressure.  Blood pressure has been normal.  No edema, stable weight.  Continues on dual antiplatelet therapy without bleeding trouble    Hypertension  Pertinent negatives include no shortness of breath.   Congestive Heart Failure  Pertinent negatives include no abdominal pain or shortness of breath.           Cardiac Medications       Nitrates       nitroGLYCERIN  (NITROSTAT ) 0.4 MG SL tablet Place 1 tablet under the tongue every 5 minutes as needed for Chest pain up to max of 3 total doses. If no relief after 1 dose, call 911.       HMG CoA Reductase Inhibitors       atorvastatin  (LIPITOR ) 40 MG tablet Take 1 tablet by mouth nightly       Alpha-Beta Blockers       carvedilol (COREG) 12.5 MG tablet Take 1 tablet by mouth 2 times daily (with meals)       Angiotensin II Receptor Antagonists       valsartan  (DIOVAN ) 160 MG tablet Take 1 tablet by mouth daily       Salicylates       aspirin  81 MG EC tablet Take 1 tablet by mouth daily       Platelet Aggregation Inhibitors       ticagrelor  (BRILINTA ) 90 MG TABS tablet Take 1 tablet by mouth 2 times daily          Diabetic Medications       Incretin Mimetic Agents       Semaglutide, 1 MG/DOSE, (OZEMPIC, 1 MG/DOSE,) 4 MG/3ML SOPN sc  injection Inject into the skin                Past Medical History, Past Surgical History, Family history, Social History, and Medications were all reviewed with the patient today and updated as necessary.     Prior to Admission medications   Medication Sig Start Date End Date Taking? Authorizing Provider   carvedilol (COREG) 12.5 MG tablet Take 1 tablet by mouth 2 times daily (with meals) 04/19/24  Yes [provider]   Semaglutide, 1 MG/DOSE, (OZEMPIC, 1 MG/DOSE,) 4 MG/3ML SOPN sc injection Inject into the skin   Yes [provider]   zolpidem (AMBIEN) 10 MG tablet Take 1 tablet by mouth. 04/13/24  Yes [provider]   valsartan  (DIOVAN ) 160 MG tablet Take 1 tablet by mouth daily   Yes [provider]  aspirin  81 MG EC tablet Take 1 tablet by mouth daily 04/03/24  Yes Davis-Pachter, Dorian, MD   nitroGLYCERIN  (NITROSTAT ) 0.4 MG SL tablet Place 1 tablet under the tongue every 5 minutes as needed for Chest pain up to max of 3 total doses. If no relief after 1 dose, call 911. 04/03/24  Yes Davis-Pachter, Dorian, MD   atorvastatin  (LIPITOR ) 40 MG tablet Take 1 tablet by mouth nightly 04/03/24  Yes Davis-Pachter, Dorian, MD   ticagrelor  (BRILINTA ) 90 MG TABS tablet Take 1 tablet by mouth 2 times daily 04/03/24  Yes Davis-Pachter, Dorian, MD   omeprazole (PRILOSEC) 40 MG delayed release capsule Take 1 capsule by mouth 09/30/20  Yes [provider]   oxyCODONE -acetaminophen  (PERCOCET ) 10-325 MG per tablet Take by mouth. 11/25/20  Yes [provider]   gabapentin  (NEURONTIN ) 400 MG capsule Take 2 capsules by mouth. 09/21/20  Yes [provider]     Allergies   Allergen Reactions    Lisinopril Hives     Past Medical History:   Diagnosis Date    Diabetes mellitus (HCC)     Hypertension      Past Surgical History:   Procedure Laterality Date    CARDIAC PROCEDURE N/A 04/02/2024    Left heart cath / coronary angiography performed by Landy Toribio SAUNDERS, MD at [facility] CARDIAC CATH LAB     CARDIAC PROCEDURE N/A 04/02/2024    Percutaneous coronary intervention performed by Landy Toribio SAUNDERS, MD at [facility] CARDIAC CATH LAB    CARDIAC PROCEDURE N/A 04/02/2024    Intravascular ultrasound performed by Landy Toribio SAUNDERS, MD at [facility] CARDIAC CATH LAB     No family history on file.  Social History     Tobacco Use    Smoking status: Some Days     Types: Cigars     Start date: 2024    Smokeless tobacco: Never    Tobacco comments:     Former cigarette smoker (15 years), currently smokes 6-7 cigars a day    Substance Use Topics    Alcohol use: Not on file       ROS:    Review of Systems   Constitutional: Negative for chills, diaphoresis and fever.   HENT:  Negative for hearing loss.    Eyes:  Negative for visual disturbance.   Cardiovascular:         As per the HPI   Respiratory:  Negative for shortness of breath.    Hematologic/Lymphatic: Does not bruise/bleed easily.   Gastrointestinal:  Negative for abdominal pain.   Genitourinary:  Negative for dysuria.   Neurological:  Negative for focal weakness.   Psychiatric/Behavioral:  Negative for suicidal ideas.           PHYSICAL EXAM:   BP 130/76 (BP Site: Left Upper Arm, Patient Position: Sitting)   Pulse 78   Resp 16   Wt 89.8 kg (198 lb)   BMI 29.24 kg/m      Wt Readings from Last 3 Encounters:   04/29/24 89.8 kg (198 lb)   04/08/24 89.4 kg (197 lb 3.2 oz)   04/03/24 87.8 kg (193 lb 8 oz)     BP Readings from Last 3 Encounters:   04/29/24 130/76   04/08/24 136/78   04/03/24 125/77     Pulse Readings from Last 3 Encounters:   04/29/24 78   04/08/24 95   04/03/24 78           Physical Exam  Vitals reviewed.  Constitutional:       General: He is not in acute distress.     Appearance: Normal appearance.   HENT:      Head: Normocephalic and atraumatic.   Eyes:      General: No scleral icterus.  Neck:      Vascular: No carotid bruit.   Cardiovascular:      Rate and Rhythm: Normal rate and regular rhythm.      Heart sounds: No murmur heard.  Pulmonary:      Breath sounds:  Normal breath sounds.   Abdominal:      General: Abdomen is flat.      Palpations: Abdomen is soft.   Musculoskeletal:         General: No swelling.      Cervical back: Neck supple.   Skin:     General: Skin is warm and dry.   Neurological:      Mental Status: He is alert and oriented to person, place, and time.   Psychiatric:         Mood and Affect: Mood normal.         Medical problems and test results were reviewed with the patient today.     DATA REVIEW    LIPID PANEL     No results found for: CHOL  No results found for: TRIG  No results found for: HDL  No components found for: LDLCHOLESTEROL, LDLCALC  No results found for: VLDL  No results found for: CHOLHDLRATIO    BMP  Lab Results   Component Value Date/Time    NA 134 04/03/2024 03:07 AM    K 4.5 04/03/2024 03:07 AM    CL 103 04/03/2024 03:07 AM    CO2 22 04/03/2024 03:07 AM    BUN 17 04/03/2024 03:07 AM    CREATININE 1.13 04/03/2024 03:07 AM    GLUCOSE 115 04/03/2024 03:07 AM    CALCIUM  8.2 04/03/2024 03:07 AM          EKG    Sinus rhythm        OUTSIDE RECORDS REVIEW    Records from outside providers have been reviewed and summarized as noted in the HPI, past history and data review sections of this note, and reviewed with patient. .       ASSESSMENT and PLAN    Shriyan was seen today for congestive heart failure and follow-up from hospital.    Diagnoses and all orders for this visit:    Non-STEMI (non-ST elevated myocardial infarction) (HCC)  -     DME - DURABLE MEDICAL EQUIPMENT    Benign essential HTN  -     DME - DURABLE MEDICAL EQUIPMENT          IMPRESSION:    [name] [name] presents today for hospital follow-up.  Recent admission for decompensated heart failure preserved ejection fraction.  Antihypertensive medications were uptitrated, he has been better since discharge.  Stable weight, no edema.  Continue current medications including his dual antiplatelet therapy.  Follow-up in 3 months.  Discussed importance of sodium restriction      No  follow-ups on file.    Personal time spent with chart review, interview/physical examination, documentation and coordination of care totals 32 minutes.       Thank you for allowing me to participate in this patient's care.  Please call or contact me if there are any questions or concerns regarding the above.      Elsie KANDICE Gerard DOUGLAS, MD  04/29/24  1:34 PM    "

## 2024-04-29 NOTE — Progress Notes (Signed)
 [facility] - Clinic Note  04/30/2024     CHIEF COMPLAINT Patient presents for Retina Follow Up   HISTORY OF PRESENT ILLNESS: [name] is a 58 y.o. male who presents to the clinic today for:   HPI     Retina Follow Up   Patient presents with  Diabetic Retinopathy.  In both eyes.  This started 6 weeks ago.  Duration of 6 weeks.  Since onset it is stable.  I, the attending physician,  performed the HPI with the patient and updated documentation appropriately.        Comments   6 week retina follow up NPDR pt is reporting no vision changes he denies any flashes or floaters pt was in the hospital for fluid on his lungs and had stent at beginning of this month his last reading was 115 a week ago       Last edited by Valdemar Rogue, MD on 04/30/2024 12:47 PM.     Pt states he had a stent put in 2 weeks ago down in [place], [place]. Pt was in [facility] last week due to fluid on his lungs, had stopped the diuretic. Feeling better  Referring physician: Clinic, [facility] [address] [facility] [place],  [place] [address]  HISTORICAL INFORMATION:   Selected notes from the MEDICAL RECORD NUMBER Referred by [facility] for retinal hemes LEE:  Ocular Hx- PMH-    CURRENT MEDICATIONS: No current outpatient medications on file. (Ophthalmic Drugs)   No current facility-administered medications for this visit. (Ophthalmic Drugs)   Current Outpatient Medications (Other)  Medication Sig   aspirin  EC 81 MG tablet Take 1 tablet (81 mg total) by mouth daily. Swallow whole.   atorvastatin (LIPITOR) 40 MG tablet Take 1 tablet (40 mg total) by mouth at bedtime.   carvedilol (COREG) 12.5 MG tablet Take 1 tablet (12.5 mg total) by mouth 2 (two) times daily with a meal.   doxycycline  (VIBRAMYCIN ) 100 MG capsule Take 1 capsule (100 mg total) by mouth 2 (two) times daily.   furosemide  (LASIX ) 20 MG tablet Take 1 tablet (20 mg total) by mouth daily.   gabapentin (NEURONTIN)  800 MG tablet Take 800 mg by mouth QID.   nitroGLYCERIN (NITROSTAT) 0.4 MG SL tablet Place 1 tablet (0.4 mg total) under the tongue every 5 (five) minutes as needed for chest pain.   omeprazole (PRILOSEC) 40 MG capsule Take 40 mg by mouth daily.   oxyCODONE -acetaminophen  (PERCOCET) 10-325 MG tablet Take 1 tablet by mouth every 4 (four) hours as needed for pain. 1 q 6 hours and 1 at bedtime   ticagrelor (BRILINTA) 90 MG TABS tablet Take 1 tablet (90 mg total) by mouth 2 (two) times daily.   valsartan (DIOVAN) 160 MG tablet Take 1 tablet (160 mg total) by mouth daily.   No current facility-administered medications for this visit. (Other)   REVIEW OF SYSTEMS:     ALLERGIES Allergies  Allergen Reactions   Lisinopril Other (See Comments) and Hives    Other reaction(s): Electrocardiogram abnormal  Other Reaction(s): Electrocardiogram abnormal, Abdominal pain, Electrocardiogram abnormal, Abdominal pain   PAST MEDICAL HISTORY Past Medical History:  Diagnosis Date   Arthritis    Diabetes mellitus    Fistula, perirectal    GERD (gastroesophageal reflux disease)    Hypertension    Osteomyelitis of great toe of right foot (HCC) 10/31/2021   Polymicrobial bacterial infection 10/31/2021   PTSD (post-traumatic stress disorder)    PTSD (post-traumatic stress disorder)  10/31/2021   Small bowel obstruction (HCC)    Past Surgical History:  Procedure Laterality Date   RECTAL SURGERY     FAMILY HISTORY Family History  Problem Relation Age of Onset   Heart attack Father    Obesity Father    SOCIAL HISTORY Social History   Tobacco Use   Smoking status: Some Days    Types: Cigars   Smokeless tobacco: Never   Tobacco comments:    Occasional cigar  Vaping Use   Vaping status: Never Used  Substance Use Topics   Alcohol use: Yes    Comment: occ   Drug use: No       OPHTHALMIC EXAM:  Base Eye Exam     Visual Acuity (Snellen - Linear)       Right Left   Dist cc 20/150 20/25 -2    Dist ph cc NI 20/20 -2         Tonometry (Tonopen, 9:03 AM)       Right Left   Pressure 16 14         Pupils       Pupils Dark Light Shape React APD   Right PERRL 3 2 Round Brisk None   Left PERRL 3 2 Round Brisk None         Visual Fields       Left Right    Full Full         Extraocular Movement       Right Left    Full, Ortho Full, Ortho         Neuro/Psych     Oriented x3: Yes   Mood/Affect: Normal         Dilation     Both eyes: 2.5% Phenylephrine @ 9:01 AM           Slit Lamp and Fundus Exam     Slit Lamp Exam       Right Left   Lids/Lashes Dermatochalasis - upper lid Dermatochalasis - upper lid   Conjunctiva/Sclera mild melanosis, nasal pingeucula mild melanosis, nasal and temporal pinguecula   Cornea arcus, 2+ Punctate epithelial erosions arcus, 2+inferior PEE, mild tear film debris   Anterior Chamber deep, clear, narrow temporal angle Deep and quiet   Iris Round and dilated, No NVI Round and dilated, No NVI   Lens 3+ Nuclear sclerosis with brunescence, 2-3+ Cortical cataract 3+ Nuclear sclerosis with brunescence, 3+ Cortical cataract   Anterior Vitreous Vitreous syneresis, blood stained vitreous condensations clearing and settling inferiorly Vitreous syneresis, +RBCs, blood stained vitreous condensations -- settling inferiorly -- turning white         Fundus Exam       Right Left   Disc Pink and Sharp, no NVD, Compact Pink and Sharp   C/D Ratio 0.2 0.2   Macula Flat, good foveal reflex, RPE mottling and clumping, scattered MA / DBH, trace cystic changes, scattered CWSs greatest superior mac Flat, Good foveal reflex, scattered MA and CWSs   Vessels attenuated, Tortuous attenuated, Tortuous   Periphery Attached, scattered DBH and CWS posteriorly, light PRP changes 360 Attached, scattered DBH and CWS posteriorly, inferior retina obscured by [facility]           Refraction     Wearing Rx       Sphere Cylinder Axis Add   Right  +0.75 Sphere  +2.50   Left +1.75 +0.50 170 +2.50           IMAGING AND PROCEDURES  Imaging and Procedures for 04/30/2024  OCT, Retina - OU - Both Eyes       Right Eye Quality was good. Central Foveal Thickness: 225. Progression has improved. Findings include normal foveal contour, no IRF, no SRF, outer retinal atrophy, vitreomacular adhesion (central ellipsoid signal disruptions and thinning; vitreous opacities -- slightly improved; irregular lamination, mild scattered IRHM, trace cystic changes--improved).   Left Eye Quality was good. Central Foveal Thickness: 298. Progression has worsened. Findings include normal foveal contour, no SRF, intraretinal hyper-reflective material, intraretinal fluid (Stable improvement in vitreous opacities / VH, cystic changes and IRHM--slightly increased temporal fovea, focal IRA temporal macula).   Notes *Images captured and stored on drive  Diagnosis / Impression:  OD: central ellipsoid signal disruptions and thinning; vitreous opacities -- slightly improved; irregular lamination, mild scattered IRHM, trace cystic changes--improved OS: Stable improvement in vitreous opacities / VH, cystic changes and IRHM--slightly increased temporal fovea, focal IRA temporal macula  Clinical management:  See below  Abbreviations: NFP - Normal foveal profile. CME - cystoid macular edema. PED - pigment epithelial detachment. IRF - intraretinal fluid. SRF - subretinal fluid. EZ - ellipsoid zone. ERM - epiretinal membrane. ORA - outer retinal atrophy. ORT - outer retinal tubulation. SRHM - subretinal hyper-reflective material. IRHM - intraretinal hyper-reflective material      Fluorescein  Angiography Optos (Transit OD)       Right Eye Progression has improved. Early phase findings include delayed filling, staining, microaneurysm, vascular perfusion defect (Delayed filling and venous return, increased FAZ, interval improvement in focal NV along SN aracades ).  Mid/Late phase findings include leakage, staining, microaneurysm, retinal neovascularization, vascular perfusion defect (Enlarged FAZ, scattered patches of vascular non-perfusion--greatest temporal midzone, mild perivascular leakage 360 periphery--improved, focal NVE off superonasal arcades--resolved s/p PRP).   Left Eye Progression has been stable. Early phase findings include microaneurysm, vascular perfusion defect (Mild enlargement of FAZ). Mid/Late phase findings include leakage, microaneurysm, vascular perfusion defect (Mild interval enlargement of FAZ, scattered patches of vascular non-perfusion greatest temporal midzone, mild perivascular leakage temporal macula and from MA, no NV).   Notes **Images stored on drive**  Impression: PDR OD -- NVE regressed s/p PRP Severe NPDR OS -- no NV Scattered MA OU OD: Enlarged FAZ, scattered patches of vascular non-perfusion--greatest temporal midzone, mild perivascular leakage 360 periphery--improved, focal NVE off superonasal arcades--resolved s/p PRP OS: Mild interval enlargement of FAZ, scattered patches of vascular non-perfusion greatest temporal midzone, mild perivascular leakage temporal macula and from MA, no NV      Intravitreal Injection, Pharmacologic Agent - OD - Right Eye       Time Out 04/30/2024. 9:55 AM. Confirmed correct patient, procedure, site, and patient consented.   Anesthesia Topical anesthesia was used. Anesthetic medications included Lidocaine  2%, Proparacaine 0.5%.   Procedure Preparation included 5% betadine to ocular surface, eyelid speculum. A supplied (32g) needle was used.   Injection: 1.25 mg Bevacizumab  1.25mg /0.65ml   Route: Intravitreal, Site: Right Eye   NDC: 49757-939-98, Lot: [id number], Expiration date: 05/24/2024   Post-op Post injection exam found visual acuity of at least counting fingers. The patient tolerated the procedure well. There were no complications. The patient received written and verbal  post procedure care education.      Intravitreal Injection, Pharmacologic Agent - OS - Left Eye       Time Out 04/30/2024. 9:55 AM. Confirmed correct patient, procedure, site, and patient consented.   Anesthesia Topical anesthesia was used. Anesthetic medications included Lidocaine  2%, Proparacaine 0.5%.   Procedure Preparation included  5% betadine to ocular surface, eyelid speculum. A supplied (33g) needle was used.   Injection: 1.25 mg Bevacizumab  1.25mg /0.5ml   Route: Intravitreal, Site: Left Eye   NDC: [id number], Lot: [id number], Expiration date: 05/24/2024   Post-op Post injection exam found visual acuity of at least counting fingers. The patient tolerated the procedure well. There were no complications. The patient received written and verbal post procedure care education. Post injection medications were not given.             ASSESSMENT/PLAN:    ICD-10-CM   1. 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 Vitreous hemorrhage of both eyes (HCC)  H43.13     3. 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 Long-term (current) use of injectable non-insulin  antidiabetic drugs  Z79.85     5. Essential hypertension  I10     6. Hypertensive retinopathy of both eyes  H35.033 Fluorescein  Angiography Optos (Transit OD)    7. Retinal ischemia  H35.82     8. Combined forms of age-related cataract of both eyes  H25.813       1-4.  Proliferative  diabetic retinopathy OD, Severe non-proliferative diabetic retinopathy, OS  ** New VH OS noted 04.11.25 exam**  - delayed f/u - 12 wks instead of 6 on 09.18.25  - A1c: 8.4 on 03.28.24  - pt lost to follow up from 06.16.23 to 11.14.23 - pt reports recent improvement in A1c from 11 down to 6 since starting Ozempic - s/p IVA OS #1 (03.22.23), #2 (04.21.23), #3 (05.19.23), #4 (06.16.23), #5 (11.15.23), #6 (12.22.24), # 7 (01.26.24), #8 (03.15.24), #9 (05.10.24), #10 (06.28.24), #11 (08.12.24), #12 (10.16.24), #13 (12.17.24), #14 (02.11.25) , #15 (04.11.25), #16 (05.13.25), #17 (06.26.25), #18 (09.18.25) - s/p IVA OD #1 (11.15.2), #2 (12.22.23), #3 (01.26.24), #4 (03.15.24), #5 (05.10.24), #6 (06.28.24), #7 (12.17.24), #8 (02.11.25), #9 (02.11.25), #10 (04.11.25), #11 (05.13.25), #12 (06.26.25), #13 (09.18.25) - s/p PRP OD (04.22.24) - new VH OD noted on 11.14.23 visit  - FA (11.14.23) OD: Enlarged FAZ, scattered patches of vascular non-perfusion greatest temporal periphery, mild perivascular leakage temporal periphery and from MA, focal early NVE superonasal midzone; OS: Mild enlargement of FAZ, scattered patches of vascular non-perfusion greatest temporal periphery, mild perivascular leakage temporal periphery and from MA; no NV -- pt would benefit from PRP OD -- completed 04.22.24 - FA (10.30.25) OD: Enlarged FAZ, scattered patches of vascular non-perfusion--greatest temporal midzone, mild perivascular leakage 360 periphery--improved, focal NVE off superonasal arcades--resolved s/p PRP, OS: Mild interval enlargement of FAZ, scattered patches of vascular non-perfusion greatest temporal midzone, mild perivascular leakage temporal macula and from MA, no NV - BCVA OD 20/150-stable (severe retinal ischemia + cataract); OS 20/20 from 20/30 - OCT shows OD: central ellipsoid signal disruptions and thinning; vitreous opacities -- slightly improved; irregular lamination, mild scattered IRHM, trace cystic  changes--improved; OS: Stable improvement in vitreous opacities / VH, cystic changes and IRHM--slightly increased temporal fovea,  focal IRA temporal macula at 6 wks - recommend IVA OU (OD #14 and OS #19) today, 10.30.25 w/ f/u in 6 wks - pt in agreement - RBA of procedure discussed, questions answered - IVA informed consent obtained and signed, 11.16.23 (OU) - see procedure note - f/u 6 weeks - DFE, OCT, possible injxns  5-7. Hypertensive retinopathy w/ retinal ischemia OU - BP uncontrolled at initial consult -- 170s / 110-120s in office 12.23.22, BP was significantly improved to 147/80 at f/u visit - FA 12.23.22 shows significant retinal ischemia OU -- enlarged FAZ OU (OD> OS) and significant patches of capillary drop out OU (OD>OS)  8. Mixed Cataract OU - The symptoms of cataract, surgical options, and treatments and risks were discussed with patient. - discussed diagnosis and progression - visually significant   - referred [facility] / Dr. Marcey for cat eval and establishment of primary eye care -- [facility]  Ophthalmic Meds Ordered this visit:  Meds ordered this encounter  Medications   Bevacizumab  (AVASTIN ) SOLN 1.25 mg   Bevacizumab  (AVASTIN ) SOLN 1.25 mg     Return in about 6 weeks (around 06/11/2024) for PDR OU, DFE, OCT, Possible Injxn.  This document serves as a record of services personally performed by Redell JUDITHANN Hans, MD, PhD. It was created on their behalf by Auston Muzzy, COMT. The creation of this record is the provider's dictation and/or activities during the visit.  Electronically signed by: Auston Muzzy, COMT 04/30/24 12:50 PM  This document serves as a record of services personally performed by Redell JUDITHANN Hans, MD, PhD. It was created on their behalf by Almetta Pesa, an ophthalmic technician. The creation of this record is the provider's dictation and/or activities during the visit.    Electronically signed by:  Almetta Pesa, OA, 04/30/24  12:50 PM   Redell JUDITHANN Hans, M.D., Ph.D. Diseases & Surgery of the Retina and Vitreous [facility]  I have reviewed the above documentation for accuracy and completeness, and I agree with the above. Redell JUDITHANN Hans, M.D., Ph.D. 04/30/24 12:50 PM   Abbreviations: M myopia (nearsighted); A astigmatism; H hyperopia (farsighted); P presbyopia; Mrx spectacle prescription;  CTL contact lenses; OD right eye; OS left eye; OU both eyes  XT exotropia; ET esotropia; PEK punctate epithelial keratitis; PEE punctate epithelial erosions; DES dry eye syndrome; MGD meibomian gland dysfunction; ATs artificial tears; PFAT's preservative free artificial tears; NSC nuclear sclerotic cataract; PSC posterior subcapsular cataract; ERM epi-retinal membrane; PVD posterior vitreous detachment; RD retinal detachment; DM diabetes mellitus; DR diabetic retinopathy; NPDR non-proliferative diabetic retinopathy; PDR proliferative diabetic retinopathy; CSME clinically significant macular edema; DME diabetic macular edema; dbh dot blot hemorrhages; CWS cotton wool spot; POAG primary open angle glaucoma; C/D cup-to-disc ratio; HVF humphrey visual field; GVF goldmann visual field; OCT optical coherence tomography; IOP intraocular pressure; BRVO Branch retinal vein occlusion; CRVO central retinal vein occlusion; CRAO central retinal artery occlusion; BRAO branch retinal artery occlusion; RT retinal tear; SB scleral buckle; PPV pars plana vitrectomy; VH Vitreous hemorrhage; PRP panretinal laser photocoagulation; IVK intravitreal kenalog; VMT vitreomacular traction; MH Macular hole;  NVD neovascularization of the disc; NVE neovascularization elsewhere; AREDS age related eye disease study; ARMD age related macular degeneration; POAG primary open angle glaucoma; EBMD epithelial/anterior basement membrane dystrophy; ACIOL anterior chamber intraocular lens; IOL intraocular lens; PCIOL posterior  chamber intraocular lens; Phaco/IOL phacoemulsification with intraocular lens placement; PRK photorefractive keratectomy; LASIK laser assisted in  situ keratomileusis; HTN hypertension; DM diabetes mellitus; COPD chronic obstructive pulmonary disease

## 2024-04-30 ENCOUNTER — Encounter (INDEPENDENT_AMBULATORY_CARE_PROVIDER_SITE_OTHER): Payer: Self-pay | Admitting: Ophthalmology

## 2024-04-30 ENCOUNTER — Ambulatory Visit (INDEPENDENT_AMBULATORY_CARE_PROVIDER_SITE_OTHER): Admitting: Ophthalmology

## 2024-04-30 VITALS — BP 161/93 | HR 77

## 2024-04-30 DIAGNOSIS — E113511 Type 2 diabetes mellitus with proliferative diabetic retinopathy with macular edema, right eye: Secondary | ICD-10-CM

## 2024-04-30 DIAGNOSIS — I10 Essential (primary) hypertension: Secondary | ICD-10-CM

## 2024-04-30 DIAGNOSIS — E113412 Type 2 diabetes mellitus with severe nonproliferative diabetic retinopathy with macular edema, left eye: Secondary | ICD-10-CM

## 2024-04-30 DIAGNOSIS — H25813 Combined forms of age-related cataract, bilateral: Secondary | ICD-10-CM

## 2024-04-30 DIAGNOSIS — H35033 Hypertensive retinopathy, bilateral: Secondary | ICD-10-CM

## 2024-04-30 DIAGNOSIS — H4313 Vitreous hemorrhage, bilateral: Secondary | ICD-10-CM | POA: Diagnosis not present

## 2024-04-30 DIAGNOSIS — Z7985 Long-term (current) use of injectable non-insulin antidiabetic drugs: Secondary | ICD-10-CM | POA: Diagnosis not present

## 2024-04-30 DIAGNOSIS — H3582 Retinal ischemia: Secondary | ICD-10-CM

## 2024-04-30 MED ORDER — BEVACIZUMAB CHEMO INJECTION 1.25MG/0.05ML SYRINGE FOR KALEIDOSCOPE
1.2500 mg | INTRAVITREAL | Status: AC | PRN
  Administered 2024-04-30: 1.25 mg via INTRAVITREAL

## 2024-05-07 ENCOUNTER — Encounter (HOSPITAL_BASED_OUTPATIENT_CLINIC_OR_DEPARTMENT_OTHER): Attending: Internal Medicine | Admitting: Internal Medicine

## 2024-05-07 DIAGNOSIS — L97522 Non-pressure chronic ulcer of other part of left foot with fat layer exposed: Secondary | ICD-10-CM | POA: Insufficient documentation

## 2024-05-07 DIAGNOSIS — E11621 Type 2 diabetes mellitus with foot ulcer: Secondary | ICD-10-CM | POA: Insufficient documentation

## 2024-05-11 MED ORDER — FUROSEMIDE 40 MG PO TABS
40 | ORAL_TABLET | Freq: Every day | ORAL | 3 refills | 30.00000 days | Status: AC
Start: 2024-05-11 — End: ?

## 2024-05-11 NOTE — Telephone Encounter (Signed)
"  The patient is calling with shortness of breath. He feels like he gasps for air at times. He feels that maybe he needs medication adjustment  "

## 2024-05-11 NOTE — Telephone Encounter (Signed)
"  heeler, [name], MD to Me (Selected Message)        05/11/24  2:04 PM  Yes, would have him take 40 daily instead of 20.      I called and informed pt.of MD response.Med escribed as below:  Requested Prescriptions     Signed Prescriptions Disp Refills    furosemide  (LASIX ) 40 MG tablet 90 tablet 3     Sig: Take 1 tablet by mouth daily     Authorizing Provider: GERARD MOULD ELSIE KANDICE     Ordering User: [name] [name]       "

## 2024-05-11 NOTE — Telephone Encounter (Signed)
"  Pt.has a smothering sensation lying flat.He also has SOB when walking a little bit.He has no edema.Does not weigh daily.He has no way to check BP on him.He does have Lasix  20mg  qday.Just seen in office 04/29/24.He says he was told to take Lasix  40mg  qday but  his bottle is for 20mg .He is asking if he should try 40mg  to see if it will help w/SOB?  "

## 2024-05-20 ENCOUNTER — Encounter (HOSPITAL_BASED_OUTPATIENT_CLINIC_OR_DEPARTMENT_OTHER): Admitting: Internal Medicine

## 2024-05-20 DIAGNOSIS — L97522 Non-pressure chronic ulcer of other part of left foot with fat layer exposed: Secondary | ICD-10-CM | POA: Diagnosis not present

## 2024-05-20 DIAGNOSIS — E11621 Type 2 diabetes mellitus with foot ulcer: Secondary | ICD-10-CM | POA: Diagnosis not present

## 2024-05-21 ENCOUNTER — Inpatient Hospital Stay: Admit: 2024-05-21 | Payer: 59

## 2024-05-21 ENCOUNTER — Encounter (HOSPITAL_BASED_OUTPATIENT_CLINIC_OR_DEPARTMENT_OTHER): Admitting: Internal Medicine

## 2024-05-21 NOTE — Cardio/Pulmonary (Signed)
"  Dear Dr. Gerard    Thank you for referring your patient, [name] [name] (DOB [birth year]), to the Cardiopulmonary Rehabilitation Program at [facility]. He is a good candidate for the Cardiac Rehab Program and should see improvements with regular participation.    We will be addressing appropriate interventions for modifiable risk factors with your patient during the next 12 weeks. We will contact you with any issues or concerns that may arise, or you can follow your patient's progress through EPIC at any time. A final summary will be sent to you when the program is completed.    Again, thank you for the referral. If we can be of further assistance, please feel free to contact the Cardiopulmonary Rehab staff at [phone].    Sincerely,    Carlin Saunas  MS, RN  Cardiopulmonary Rehabilitation Nurse   [facility]   "

## 2024-05-21 NOTE — Flowsheet Note (Signed)
 "   05/21/24 1100   Treatment Diagnosis   Treatment Diagnosis 1 NSTEMI   NSTEMI Date 04/02/24   Treatment Diagnosis 2 PCI   PCI Date 04/02/24   Referral Date 04/15/24   Co-morbidities Diabetes  (OSA)   Individual Treatment Plan   1st Date of Exercise  05/21/24   ITP Next Review Date 06/18/24   Visit #/Total Visits 1   EF%   (50-55%  04/02/2024)   Risk Stratification High   Supplemental Oxygen   Oxygen Use No  (cpap at night occassioanlly)   Exercise Assessment   Stages of Change Preparation   Assisted Devices Muir Total Score 25   Morse Fall Risk Medium (25-44)   Exercise Intervention/Prescription   Mode Stepper;Ergometer   Frequency per week 3   Duration Per Session 35 minutes   Target RPE 11-13   Target Heart Rate RHR+30 bpm   Exercise Activity at Home   Type none at present time   Exercise Education   Education Deep breathing techniques;Dyspnea management;Exercise safety;Physically active;RPE scale;Signs/symptoms to report;Warm up/cool down   Exercise Goals   Patient Target Goals Aerobic activity 30 + minutes/day  5 days/week;Decrease dyspnea (exercise);Improve endurance;Individual exercise RX;Maintain exercise and activity at a moderate intensity   Patient Treatment Goal To gain more energy   Goal Status Initial   Psychosocial Assessment   Stages of Change Preparation   [place] Total Score 26   PHQ-9 Total Score 14   Psychosocial Intervention   Interventions Currently under treatment for depression  (Currently going to [place] for treatment PTSD)   Currently Taking Psychotropic Meds Yes  (ambien 10mg  daily)   Medication Changes No   Psychosocial Education   Education Advanced directives   Psychosocial Goals   Patient Target Goals Assess presence or absence of depression using a valid screening tool;Demonstrates appropriate interaction with others;Engages in self-care behaviors;Maximizes coping skills;Positive support group   Patient Treatment Goal To have PHQ9 score less than 10 by discharge   Goal Status  Initial   Other Core Component - Tobacco Cessation   History of Tobacco/E-Cigarette/Chewing Tobacco/Other yes  (smokes cigar)   Current/Recent Use <6 mo. - Managed as Core Component Yes   Tobacco Cessation Assessment   Stages of Change Contemplate   Type Cigar   Tobacco Use Status   (says uses clove cigar)   Tobacco Cessation Intervention   Smoking Cessation Referral No  (patient declines)   Tobacco Cessation Education   Resource Information Provided No  (patient declines)   Tobacco Triggers PTSD   Tobacco Cessation Goals   Patient Target Goals Complete cessation of tobacco on a target quit date   Patient Treatment Goal To quit smoking cigars   Goal Status Initial   Nutrition Assessment   Stages of Change Contemplate   Nutrition Assessment Tool Rate Your Plate   Rate Your Plate Total Score 46   Current Diet eat alot of baked chicken, vegetables. yogurt, banan, bacon egg.  Eats alot of nuts, oranges applesauce, has cut fried foods   Barriers to Heart Healthy Diet None idenitfied   Alcohol No   Other Core Component - Weight Management   BMI <18.5 or > 24.9, Managed as Core Component No   Other Core Component - Diabetes   Diabetes Yes   Diabetes Management Assessment   Stages of Change Contemplate   FBS 84-110   HgbA1c 5.9   HgbA1c Date 04/02/24   BG at Home Yes  (daily)   BG  Frequency once a day   Continuous Glucose Monitoring No   BG in Range Yes   Diabetes Management Interventions   Diabetes Med(s) ozempic   Diabetes Med(s) Change No   Demonstrates Medication Compliance Yes   Diabetes Management Education   Resource Information Provided Yes   Diabetes Education Referral No  (declines for now)   Diabetes Management Goals   Patient Target Goals HgA1c<7%   Patient Treatment Goal To keep blood glucose under 7%   Goal Status Initial   Other Core Component - Lipid Management   Lipids Managed as Core Component No   Not Managed as a Core Component   Date Lipids Drawn   (none available)   Nutrition Intervention   Dietitian  Consult Yes  (patient interested in classes)   Nutrition Education   Resource Information Provided Yes  (nutiriton class)   Nutrition Goals   Patient Target Goals   (weight gain)   Patient Treatment Goal To not drop below 200 lbs, with good body composition   Goal Status Initial   Education   Learning Barrier Ready to learn   Other Core Component - Hypertension   Hypertension Yes   Hypertension Managed as Core Component No   Medications   Resource Information Provided Yes  (cardiac rehab educational manual)   Med(s) Change No   BP Meds carvidiolol; valsartan    ACE/ARB/ARNI Prescribed Yes   ACE/ARB/ARNI Adherent Yes   BB Prescribed Yes   BB Adherent Yes   Antiplatelet/Anticoagulant Prescribed Yes  (ASA,  Brilinta )   Antiplatelet/Anticoagulant Adherent Yes   Statins/Anti-hyperlipidemic Prescribed Yes   Statins/Anti-hyperlipidemic Adherent Yes   Statins/Anti-hyperlipidemic Intensity Moderate     "

## 2024-05-25 ENCOUNTER — Inpatient Hospital Stay: Admit: 2024-05-25 | Payer: 59

## 2024-05-25 NOTE — Flowsheet Note (Signed)
 "   05/25/24 1453   Treatment Diagnosis   Treatment Diagnosis 1 NSTEMI   NSTEMI Date 04/02/24   Treatment Diagnosis 2 PCI   PCI Date 04/02/24   Referral Date 04/15/24   Co-morbidities Diabetes  (OSA)   Individual Treatment Plan   ITP Visit Type Initial assessment   1st Date of Exercise  05/25/24   ITP Next Review Date 06/22/24   Visit #/Total Visits 1   EF%   (50-55%  04/02/2024)   Risk Stratification High   Supplemental Oxygen   Oxygen Use No  (cpap at night occassioanlly)   Exercise Assessment   Stages of Change Preparation   Assisted Devices Cane   Test Other (comment)  (nustep test)   Data Measured Before Test   Heart Rate 87   Resting Blood Pressure 124/70   SpO2 99   O2 Device Room air   RPD 0   Data Measured During Test   Indicate Mode of RPE 6 minutes   Modality Stepper   Stepper Steps/6 Min 350   METS 2.5   Blood Pressure 142/70   Lowest SpO2 100 %   O2 Device Room air   RPD 0   RPE 9   Data Measured at Peak   Distance Calculated in  ft   Distance (Feet-Actual) 850 ft  (based on steps to feet conversion on nuste)   Peak Heart Rate 95   Peak Blood Pressure 142/70   Peak RPE 9   SpO2 99   O2 Flow Rate (L/min)   (room air)   Data Measured at 5 Minutes After Test   Heart Rate 84   Blood Pressure 128/70   RPD 0   SpO2 99 %   O2 Device Room air   Exercise Intervention/Prescription   Mode Stepper;Ergometer;Bike   Frequency per week 3   Duration Per Session 35 minutes   Intensity METS       2.5   Target RPE 11-13   Progression 2.8 METS for 40 minutes   Symptoms with Exercise Denies   Target Heart Rate RHR+30 bpm   Resistance Training No  (evaluate in next 30 days)   Exercise Activity at Home   Type none at present time   Exercise Education   Education Deep breathing techniques;Dyspnea management;Exercise safety;Physically active;RPE scale;Signs/symptoms to report;Warm up/cool down   Exercise Goals   Patient Target Goals Aerobic activity 30 + minutes/day  5 days/week;Decrease dyspnea (exercise);Improve  endurance;Individual exercise RX;Maintain exercise and activity at a moderate intensity   Patient Treatment Goal To gain more energy   Goal Status Initial   Progress Towards Goal Started cardiac rehab on this date   Psychosocial Assessment   Stages of Change Preparation   Psychosocial Intervention   Interventions Currently under treatment for depression  (Currently going to [place] for treatment PTSD)   Stress Management Techniques Connects with others   Resources Provided Other (comment)  (Cardiac rehab eduacational manual)   Currently Taking Psychotropic Meds Yes  (ambien 10mg  daily)   Medication Changes No   Psychosocial Education   Education Advanced directives   Psychosocial Goals   Patient Target Goals Assess presence or absence of depression using a valid screening tool;Demonstrates appropriate interaction with others;Engages in self-care behaviors;Maximizes coping skills;Positive support group   Patient Treatment Goal To have PHQ9 score less than 10 by discharge   Goal Status Initial   Progress Towards Goal Patient has stress management schedule   Other Core Component - Tobacco Cessation  History of Tobacco/E-Cigarette/Chewing Tobacco/Other yes  (smokes cigar)   Current/Recent Use <6 mo. - Managed as Core Component Yes   Tobacco Cessation Assessment   Stages of Change Contemplate   Type Cigar   Tobacco Use Status   (says uses clove cigar)   Tobacco Cessation Intervention   Smoking Cessation Referral No  (patient declines)   Tobacco Cessation Education   Resource Information Provided No  (patient declines)   Tobacco Triggers PTSD   Tobacco Cessation Goals   Patient Target Goals Complete cessation of tobacco on a target quit date   Patient Treatment Goal To quit smoking cigars   Goal Status Initial   Nutrition Assessment   Stages of Change Contemplate   Nutrition Assessment Tool Rate Your Plate   Current Diet eat alot of baked chicken, vegetables. yogurt, banan, bacon egg.  Eats alot of nuts, oranges applesauce,  has cut fried foods   Barriers to Heart Healthy Diet None idenitfied   Alcohol No   Height  1.753 m (5' 9)   Weight  87.5 kg (193 lb)   BMI 28.56   Waist Circumference (In) 39 inches   Other Core Component - Weight Management   BMI <18.5 or > 24.9, Managed as Core Component No   Other Core Component - Diabetes   Diabetes Yes   Diabetes Management Assessment   Stages of Change Contemplate   FBS 84-110   HgbA1c 5.9   HgbA1c Date 04/02/24   BG at Home Yes  (daily)   BG Frequency once a day   Continuous Glucose Monitoring No   BG in Range Yes   Diabetes Management Interventions   Diabetes Med(s) ozempic   Diabetes Med(s) Change No   Demonstrates Medication Compliance Yes   Diabetes Management Education   Resource Information Provided Yes   Diabetes Education Referral No  (declines for now)   Diabetes Management Goals   Patient Target Goals HgA1c<7%   Patient Treatment Goal To keep blood glucose under 7%   Goal Status Initial   Progress Towards Goal Started cardiac rehab on this date   Other Core Component - Lipid Management   Lipids Managed as Core Component No   Not Managed as a Core Component   Date Lipids Drawn   (none available)   Lipid Med(s) lipitor    Lipid Med Change(s) No   Nutrition Intervention   Dietitian Consult Yes  (patient interested in classes)   Nutrition Education   Resource Information Provided Yes  (nutiriton class)   Nutrition Goals   Patient Target Goals   (weight gain)   Patient Treatment Goal To not drop below 200 lbs, with good body composition   Goal Status Initial   Progress Towards Goal Started cardiac rehab on this date   Education   Learning Barrier Ready to learn   Other Core Component - Hypertension   Hypertension Yes   Hypertension Managed as Core Component No   Medications   Resource Information Provided Yes  (cardiac rehab educational manual)   Med(s) Change No   BP Meds carvidiolol; valsartan    ACE/ARB/ARNI Prescribed Yes   ACE/ARB/ARNI Adherent Yes   BB Prescribed Yes   BB Adherent  Yes   Antiplatelet/Anticoagulant Prescribed Yes  (ASA,  Brilinta )   Antiplatelet/Anticoagulant Adherent Yes   Statins/Anti-hyperlipidemic Prescribed Yes   Statins/Anti-hyperlipidemic Adherent Yes   Statins/Anti-hyperlipidemic Intensity Moderate     "

## 2024-05-26 NOTE — Progress Notes (Shared)
 [facility] - Clinic Note  06/09/2024     CHIEF COMPLAINT Patient presents for No chief complaint on file.   HISTORY OF PRESENT ILLNESS: [name] is a 58 y.o. male who presents to the clinic today for:     Pt states he had a stent put in 2 weeks ago down in [place], [place]. Pt was in [facility] last week due to fluid on his lungs, had stopped the diuretic. Feeling better  Referring physician: Clinic, Bonni Lien [address] [facility] [place],  [place] [address]  HISTORICAL INFORMATION:   Selected notes from the MEDICAL RECORD NUMBER Referred by [facility] for retinal hemes LEE:  Ocular Hx- PMH-    CURRENT MEDICATIONS: No current outpatient medications on file. (Ophthalmic Drugs)   No current facility-administered medications for this visit. (Ophthalmic Drugs)   Current Outpatient Medications (Other)  Medication Sig   aspirin  EC 81 MG tablet Take 1 tablet (81 mg total) by mouth daily. Swallow whole.   atorvastatin  (LIPITOR) 40 MG tablet Take 1 tablet (40 mg total) by mouth at bedtime.   carvedilol  (COREG ) 12.5 MG tablet Take 1 tablet (12.5 mg total) by mouth 2 (two) times daily with a meal.   doxycycline  (VIBRAMYCIN ) 100 MG capsule Take 1 capsule (100 mg total) by mouth 2 (two) times daily.   furosemide  (LASIX ) 20 MG tablet Take 1 tablet (20 mg total) by mouth daily.   gabapentin  (NEURONTIN ) 800 MG tablet Take 800 mg by mouth QID.   nitroGLYCERIN  (NITROSTAT ) 0.4 MG SL tablet Place 1 tablet (0.4 mg total) under the tongue every 5 (five) minutes as needed for chest pain.   omeprazole (PRILOSEC) 40 MG capsule Take 40 mg by mouth daily.   oxyCODONE -acetaminophen  (PERCOCET) 10-325 MG tablet Take 1 tablet by mouth every 4 (four) hours as needed for pain. 1 q 6 hours and 1 at bedtime   ticagrelor  (BRILINTA ) 90 MG TABS tablet Take 1 tablet (90 mg total) by mouth 2 (two) times daily.   valsartan  (DIOVAN ) 160 MG tablet Take 1 tablet (160 mg total) by mouth  daily.   No current facility-administered medications for this visit. (Other)   REVIEW OF SYSTEMS:     ALLERGIES Allergies  Allergen Reactions   Lisinopril Other (See Comments) and Hives    Other reaction(s): Electrocardiogram abnormal  Other Reaction(s): Electrocardiogram abnormal, Abdominal pain, Electrocardiogram abnormal, Abdominal pain   PAST MEDICAL HISTORY Past Medical History:  Diagnosis Date   Arthritis    Diabetes mellitus    Fistula, perirectal    GERD (gastroesophageal reflux disease)    Hypertension    Osteomyelitis of great toe of right foot (HCC) 10/31/2021   Polymicrobial bacterial infection 10/31/2021   PTSD (post-traumatic stress disorder)    PTSD (post-traumatic stress disorder) 10/31/2021   Small bowel obstruction (HCC)    Past Surgical History:  Procedure Laterality Date   RECTAL SURGERY     FAMILY HISTORY Family History  Problem Relation Age of Onset   Heart attack Father    Obesity Father    SOCIAL HISTORY Social History   Tobacco Use   Smoking status: Some Days    Types: Cigars   Smokeless tobacco: Never   Tobacco comments:    Occasional cigar  Vaping Use   Vaping status: Never Used  Substance Use Topics   Alcohol use: Yes    Comment: occ   Drug use: No       OPHTHALMIC EXAM:  Not recorded  IMAGING AND PROCEDURES  Imaging and Procedures for 06/09/2024           ASSESSMENT/PLAN:  No diagnosis found.   1-4.  Proliferative diabetic retinopathy OD, Severe non-proliferative diabetic retinopathy, OS  ** New VH OS noted 04.11.25 exam**  - delayed f/u - 12 wks instead of 6 on 09.18.25  - A1c: 8.4 on 03.28.24  - pt lost to follow up from 06.16.23 to 11.14.23 - pt reports recent improvement in A1c from 11 down to 6 since starting Ozempic - s/p IVA OS #1 (03.22.23), #2 (04.21.23), #3 (05.19.23), #4 (06.16.23), #5 (11.15.23), #6 (12.22.24), # 7 (01.26.24), #8 (03.15.24), #9 (05.10.24), #10 (06.28.24), #11 (08.12.24), #12  (10.16.24), #13 (12.17.24), #14 (02.11.25) , #15 (04.11.25), #16 (05.13.25), #17 (06.26.25), #18 (09.18.25), #19 (10.30.25) - s/p IVA OD #1 (11.15.2), #2 (12.22.23), #3 (01.26.24), #4 (03.15.24), #5 (05.10.24), #6 (06.28.24), #7 (12.17.24), #8 (02.11.25), #9 (02.11.25), #10 (04.11.25), #11 (05.13.25), #12 (06.26.25), #13 (09.18.25), #14 (10.30.25) - s/p PRP OD (04.22.24) - new VH OD noted on 11.14.23 visit  - FA (11.14.23) OD: Enlarged FAZ, scattered patches of vascular non-perfusion greatest temporal periphery, mild perivascular leakage temporal periphery and from MA, focal early NVE superonasal midzone; OS: Mild enlargement of FAZ, scattered patches of vascular non-perfusion greatest temporal periphery, mild perivascular leakage temporal periphery and from MA; no NV -- pt would benefit from PRP OD -- completed 04.22.24 - FA (10.30.25) OD: Enlarged FAZ, scattered patches of vascular non-perfusion--greatest temporal midzone, mild perivascular leakage 360 periphery--improved, focal NVE off superonasal arcades--resolved s/p PRP, OS: Mild interval enlargement of FAZ, scattered patches of vascular non-perfusion greatest temporal midzone, mild perivascular leakage temporal macula and from MA, no NV - BCVA OD 20/150-stable (severe retinal ischemia + cataract); OS 20/20 from 20/30 - OCT shows OD: central ellipsoid signal disruptions and thinning; vitreous opacities -- slightly improved; irregular lamination, mild scattered IRHM, trace cystic changes--improved; OS: Stable improvement in vitreous opacities / VH, cystic changes and IRHM--slightly increased temporal fovea, focal IRA temporal macula at 6 wks - recommend IVA OU (OD #15 and OS #20) today, 12.09.25 w/ f/u in 6 wks - pt in agreement - RBA of procedure discussed, questions answered - IVA informed consent obtained and signed, 11.16.23 (OU) - see procedure note - f/u 6 weeks - DFE, OCT, possible injxns  5-7. Hypertensive retinopathy w/ retinal ischemia  OU - BP uncontrolled at initial consult -- 170s / 110-120s in office 12.23.22, BP was significantly improved to 147/80 at f/u visit - FA 12.23.22 shows significant retinal ischemia OU -- enlarged FAZ OU (OD> OS) and significant patches of capillary drop out OU (OD>OS)  8. Mixed Cataract OU - The symptoms of cataract, surgical options, and treatments and risks were discussed with patient. - discussed diagnosis and progression - visually significant   - [facility] / Dr. Marcey for cat eval and establishment of primary eye care -- [facility]  Ophthalmic Meds Ordered this visit:  No orders of the defined types were placed in this encounter.    No follow-ups on file.  This document serves as a record of services personally performed by Redell JUDITHANN Hans, MD, PhD. It was created on their behalf by Wanda GEANNIE Keens, COT an ophthalmic technician. The creation of this record is the provider's dictation and/or activities during the visit.    Electronically signed by:  Wanda GEANNIE Keens, COT  05/26/24 12:26 PM    Redell JUDITHANN Hans,  M.D., Ph.D. Diseases & Surgery of the Retina and Vitreous [facility]   Abbreviations: M myopia (nearsighted); A astigmatism; H hyperopia (farsighted); P presbyopia; Mrx spectacle prescription;  CTL contact lenses; OD right eye; OS left eye; OU both eyes  XT exotropia; ET esotropia; PEK punctate epithelial keratitis; PEE punctate epithelial erosions; DES dry eye syndrome; MGD meibomian gland dysfunction; ATs artificial tears; PFAT's preservative free artificial tears; NSC nuclear sclerotic cataract; PSC posterior subcapsular cataract; ERM epi-retinal membrane; PVD posterior vitreous detachment; RD retinal detachment; DM diabetes mellitus; DR diabetic retinopathy; NPDR non-proliferative diabetic retinopathy; PDR proliferative diabetic retinopathy; CSME clinically significant macular edema; DME  diabetic macular edema; dbh dot blot hemorrhages; CWS cotton wool spot; POAG primary open angle glaucoma; C/D cup-to-disc ratio; HVF humphrey visual field; GVF goldmann visual field; OCT optical coherence tomography; IOP intraocular pressure; BRVO Branch retinal vein occlusion; CRVO central retinal vein occlusion; CRAO central retinal artery occlusion; BRAO branch retinal artery occlusion; RT retinal tear; SB scleral buckle; PPV pars plana vitrectomy; VH Vitreous hemorrhage; PRP panretinal laser photocoagulation; IVK intravitreal kenalog; VMT vitreomacular traction; MH Macular hole;  NVD neovascularization of the disc; NVE neovascularization elsewhere; AREDS age related eye disease study; ARMD age related macular degeneration; POAG primary open angle glaucoma; EBMD epithelial/anterior basement membrane dystrophy; ACIOL anterior chamber intraocular lens; IOL intraocular lens; PCIOL posterior chamber intraocular lens; Phaco/IOL phacoemulsification with intraocular lens placement; PRK photorefractive keratectomy; LASIK laser assisted in situ keratomileusis; HTN hypertension; DM diabetes mellitus; COPD chronic obstructive pulmonary disease

## 2024-06-01 ENCOUNTER — Encounter (HOSPITAL_BASED_OUTPATIENT_CLINIC_OR_DEPARTMENT_OTHER): Attending: Internal Medicine | Admitting: Internal Medicine

## 2024-06-01 DIAGNOSIS — E11621 Type 2 diabetes mellitus with foot ulcer: Secondary | ICD-10-CM | POA: Diagnosis not present

## 2024-06-01 DIAGNOSIS — L089 Local infection of the skin and subcutaneous tissue, unspecified: Secondary | ICD-10-CM | POA: Insufficient documentation

## 2024-06-01 DIAGNOSIS — L97522 Non-pressure chronic ulcer of other part of left foot with fat layer exposed: Secondary | ICD-10-CM | POA: Diagnosis not present

## 2024-06-02 ENCOUNTER — Encounter (HOSPITAL_BASED_OUTPATIENT_CLINIC_OR_DEPARTMENT_OTHER): Admitting: Internal Medicine

## 2024-06-04 ENCOUNTER — Encounter (HOSPITAL_BASED_OUTPATIENT_CLINIC_OR_DEPARTMENT_OTHER): Admitting: Internal Medicine

## 2024-06-08 ENCOUNTER — Inpatient Hospital Stay: Admit: 2024-06-08 | Payer: 59

## 2024-06-08 DIAGNOSIS — I214 Non-ST elevation (NSTEMI) myocardial infarction: Principal | ICD-10-CM

## 2024-06-09 ENCOUNTER — Inpatient Hospital Stay: Admit: 2024-06-09 | Payer: 59

## 2024-06-09 ENCOUNTER — Encounter (INDEPENDENT_AMBULATORY_CARE_PROVIDER_SITE_OTHER): Admitting: Ophthalmology

## 2024-06-09 DIAGNOSIS — H4311 Vitreous hemorrhage, right eye: Secondary | ICD-10-CM

## 2024-06-09 DIAGNOSIS — H35033 Hypertensive retinopathy, bilateral: Secondary | ICD-10-CM

## 2024-06-09 DIAGNOSIS — E113511 Type 2 diabetes mellitus with proliferative diabetic retinopathy with macular edema, right eye: Secondary | ICD-10-CM

## 2024-06-09 DIAGNOSIS — H25813 Combined forms of age-related cataract, bilateral: Secondary | ICD-10-CM

## 2024-06-09 DIAGNOSIS — H3582 Retinal ischemia: Secondary | ICD-10-CM

## 2024-06-09 DIAGNOSIS — E113412 Type 2 diabetes mellitus with severe nonproliferative diabetic retinopathy with macular edema, left eye: Secondary | ICD-10-CM

## 2024-06-09 DIAGNOSIS — I10 Essential (primary) hypertension: Secondary | ICD-10-CM

## 2024-06-09 DIAGNOSIS — Z7985 Long-term (current) use of injectable non-insulin antidiabetic drugs: Secondary | ICD-10-CM

## 2024-06-09 DIAGNOSIS — H4313 Vitreous hemorrhage, bilateral: Secondary | ICD-10-CM

## 2024-06-09 NOTE — Progress Notes (Signed)
 [facility] - Clinic Note  06/12/2024     CHIEF COMPLAINT Patient presents for Retina Follow Up   HISTORY OF PRESENT ILLNESS: [name] is a 58 y.o. male who presents to the clinic today for:   HPI     Retina Follow Up   Patient presents with  Diabetic Retinopathy.  In both eyes.  This started 6 weeks ago.  Duration of 6 weeks.  Since onset it is stable.  I, the attending physician,  performed the HPI with the patient and updated documentation appropriately.        Comments   Pt denies any changes in vision. Pt noticed some new floaters that lasted 1 day about a week ago in the right eye. Pt states he can tell medication begins to wear off, vision become more hazy at week 5 and 6. BS=100 on average A1c=5.9 10.02.25       Last edited by Valdemar Rogue, MD on 06/12/2024 12:50 PM.     Patient feels the vision is the same. He did see a few floaters.   Referring physician: Clinic, [facility] [address] [facility] [place],  [place] [address]  HISTORICAL INFORMATION:   Selected notes from the MEDICAL RECORD NUMBER Referred by [facility] for retinal hemes LEE:  Ocular Hx- PMH-    CURRENT MEDICATIONS: No current outpatient medications on file. (Ophthalmic Drugs)   No current facility-administered medications for this visit. (Ophthalmic Drugs)   Current Outpatient Medications (Other)  Medication Sig   aspirin  EC 81 MG tablet Take 1 tablet (81 mg total) by mouth daily. Swallow whole.   atorvastatin  (LIPITOR) 40 MG tablet Take 1 tablet (40 mg total) by mouth at bedtime.   carvedilol  (COREG ) 12.5 MG tablet Take 1 tablet (12.5 mg total) by mouth 2 (two) times daily with a meal.   doxycycline  (VIBRAMYCIN ) 100 MG capsule Take 1 capsule (100 mg total) by mouth 2 (two) times daily.   furosemide  (LASIX ) 20 MG tablet Take 1 tablet (20 mg total) by mouth daily.   gabapentin  (NEURONTIN ) 800 MG tablet Take 800 mg by mouth QID.   nitroGLYCERIN  (NITROSTAT )  0.4 MG SL tablet Place 1 tablet (0.4 mg total) under the tongue every 5 (five) minutes as needed for chest pain.   omeprazole (PRILOSEC) 40 MG capsule Take 40 mg by mouth daily.   oxyCODONE -acetaminophen  (PERCOCET) 10-325 MG tablet Take 1 tablet by mouth every 4 (four) hours as needed for pain. 1 q 6 hours and 1 at bedtime   ticagrelor  (BRILINTA ) 90 MG TABS tablet Take 1 tablet (90 mg total) by mouth 2 (two) times daily.   valsartan  (DIOVAN ) 160 MG tablet Take 1 tablet (160 mg total) by mouth daily.   No current facility-administered medications for this visit. (Other)   REVIEW OF SYSTEMS: ROS   Positive for: Endocrine, Eyes Negative for: Constitutional, Gastrointestinal, Neurological, Skin, Genitourinary, Musculoskeletal, HENT, Cardiovascular, Respiratory, Psychiatric, Allergic/Imm, Heme/Lymph Last edited by Elnor Avelina RAMAN, COT on 06/12/2024  8:24 AM.     ALLERGIES Allergies  Allergen Reactions   Lisinopril Other (See Comments) and Hives    Other reaction(s): Electrocardiogram abnormal  Other Reaction(s): Electrocardiogram abnormal, Abdominal pain, Electrocardiogram abnormal, Abdominal pain   PAST MEDICAL HISTORY Past Medical History:  Diagnosis Date   Arthritis    Diabetes mellitus    Fistula, perirectal    GERD (gastroesophageal reflux disease)    Hypertension    Osteomyelitis of great toe of right foot (HCC) 10/31/2021  Polymicrobial bacterial infection 10/31/2021   PTSD (post-traumatic stress disorder)    PTSD (post-traumatic stress disorder) 10/31/2021   Small bowel obstruction (HCC)    Past Surgical History:  Procedure Laterality Date   RECTAL SURGERY     FAMILY HISTORY Family History  Problem Relation Age of Onset   Heart attack Father    Obesity Father    SOCIAL HISTORY Social History   Tobacco Use   Smoking status: Some Days    Types: Cigars   Smokeless tobacco: Never   Tobacco comments:    Occasional cigar  Vaping Use   Vaping status: Never Used   Substance Use Topics   Alcohol use: Yes    Comment: occ   Drug use: No       OPHTHALMIC EXAM:  Base Eye Exam     Visual Acuity (Snellen - Linear)       Right Left   Dist cc 20/150 +2 20/25 +2   Dist ph cc NI 20/20 -1    Correction: Glasses         Tonometry (Tonopen, 8:19 AM)       Right Left   Pressure 18 15         Pupils       Pupils Dark Light Shape React APD   Right PERRL 3 2 Round Brisk None   Left PERRL 3 2 Round Brisk None         Visual Fields       Left Right    Full Full         Extraocular Movement       Right Left    Full, Ortho Full, Ortho         Neuro/Psych     Oriented x3: Yes   Mood/Affect: Normal         Dilation     Both eyes: 1.0% Mydriacyl, 2.5% Phenylephrine @ 8:19 AM           Slit Lamp and Fundus Exam     Slit Lamp Exam       Right Left   Lids/Lashes Dermatochalasis - upper lid Dermatochalasis - upper lid   Conjunctiva/Sclera mild melanosis, nasal pingeucula mild melanosis, nasal and temporal pinguecula   Cornea arcus, 2+ Punctate epithelial erosions arcus, 2+inferior PEE, mild tear film debris   Anterior Chamber deep, clear, narrow temporal angle Deep and quiet   Iris Round and dilated, No NVI Round and dilated, No NVI   Lens 3+ Nuclear sclerosis with brunescence, 2-3+ Cortical cataract 3+ Nuclear sclerosis with brunescence, 3+ Cortical cataract   Anterior Vitreous Vitreous syneresis, blood stained vitreous condensations essentially cleared Vitreous syneresis, trace white vit condensations inferiorly--improving         Fundus Exam       Right Left   Disc Pink and Sharp, no NVD, Compact Pink and Sharp   C/D Ratio 0.2 0.2   Macula Flat, good foveal reflex, RPE mottling and clumping, scattered MA / DBH, trace cystic changes Flat, Good foveal reflex, scattered MA and CWSs   Vessels attenuated, Tortuous attenuated, Tortuous   Periphery Attached, scattered DBH and CWS posteriorly, light PRP changes  360 Attached, scattered DBH and CWS posteriorly           Refraction     Wearing Rx       Sphere Cylinder Axis Add   Right +0.75 Sphere  +2.50   Left +1.75 +0.50 170 +2.50  IMAGING AND PROCEDURES  Imaging and Procedures for 06/12/2024  OCT, Retina - OU - Both Eyes       Right Eye Quality was borderline. Central Foveal Thickness: 269. Progression has been stable. Findings include normal foveal contour, no IRF, no SRF, outer retinal atrophy, vitreomacular adhesion (central ellipsoid signal disruptions and thinning; vitreous opacities; irregular lamination, mild scattered IRHM, trace cystic changes--stably improved).   Left Eye Quality was good. Central Foveal Thickness: 291. Progression has improved. Findings include normal foveal contour, no SRF, intraretinal hyper-reflective material, intraretinal fluid (Stable improvement in vitreous opacities / VH, cystic changes and IRHM--slightly improved temporal fovea, focal IRA temporal macula).   Notes *Images captured and stored on drive  Diagnosis / Impression:  OD: central ellipsoid signal disruptions and thinning; vitreous opacities; irregular lamination, mild scattered IRHM, trace cystic changes--stably improved OS: Stable improvement in vitreous opacities / VH, cystic changes and IRHM--slightly improved temporal fovea, focal IRA temporal macula  Clinical management:  See below  Abbreviations: NFP - Normal foveal profile. CME - cystoid macular edema. PED - pigment epithelial detachment. IRF - intraretinal fluid. SRF - subretinal fluid. EZ - ellipsoid zone. ERM - epiretinal membrane. ORA - outer retinal atrophy. ORT - outer retinal tubulation. SRHM - subretinal hyper-reflective material. IRHM - intraretinal hyper-reflective material      Intravitreal Injection, Pharmacologic Agent - OD - Right Eye       Time Out 06/12/2024. 9:12 AM. Confirmed correct patient, procedure, site, and patient consented.    Anesthesia Topical anesthesia was used. Anesthetic medications included Lidocaine  2%, Proparacaine 0.5%.   Procedure Preparation included 5% betadine to ocular surface, eyelid speculum. A supplied (32g) needle was used.   Injection: 1.25 mg Bevacizumab  1.25mg /0.61ml   Route: Intravitreal, Site: Right Eye   NDC: C2662926, Lot: [id number]$ , Expiration date: 07/02/2024   Post-op Post injection exam found visual acuity of at least counting fingers. The patient tolerated the procedure well. There were no complications. The patient received written and verbal post procedure care education.      Intravitreal Injection, Pharmacologic Agent - OS - Left Eye       Time Out 06/12/2024. 9:13 AM. Confirmed correct patient, procedure, site, and patient consented.   Anesthesia Topical anesthesia was used. Anesthetic medications included Lidocaine  2%, Proparacaine 0.5%.   Procedure Preparation included 5% betadine to ocular surface, eyelid speculum. A (33g) needle was used.   Injection: 1.25 mg Bevacizumab  1.25mg /0.77ml   Route: Intravitreal, Site: Left Eye   NDC: C2662926, Lot: [id number], Expiration date: 08/22/2024   Post-op Post injection exam found visual acuity of at least counting fingers. The patient tolerated the procedure well. There were no complications. The patient received written and verbal post procedure care education. Post injection medications were not given.            ASSESSMENT/PLAN:    ICD-10-CM   1. 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 Vitreous hemorrhage of both eyes (HCC)  H43.13 Intravitreal Injection, Pharmacologic Agent - OS - Left Eye    Bevacizumab  (AVASTIN ) SOLN 1.25 mg    3. Severe nonproliferative diabetic retinopathy of  left eye with macular edema associated with type 2 diabetes mellitus (HCC)  E11.3412 OCT, Retina - OU - Both Eyes    Intravitreal Injection,  Pharmacologic Agent - OS - Left Eye    Bevacizumab  (AVASTIN ) SOLN 1.25 mg    4. Long-term (current) use of injectable non-insulin  antidiabetic drugs  Z79.85     5. Essential hypertension  I10     6. Hypertensive retinopathy of both eyes  H35.033     7. Retinal ischemia  H35.82     8. Combined forms of age-related cataract of both eyes  H25.813      1-4.  Proliferative diabetic retinopathy OD, Severe non-proliferative diabetic retinopathy, OS  ** New VH OS noted 04.11.25 exam**  - delayed f/u - 12 wks instead of 6 on 09.18.25  - A1c: 8.4 on 03.28.24  - pt lost to follow up from 06.16.23 to 11.14.23 - pt reports recent improvement in A1c from 11 down to 6 since starting Ozempic - s/p IVA OS #1 (03.22.23), #2 (04.21.23), #3 (05.19.23), #4 (06.16.23), #5 (11.15.23), #6 (12.22.24), # 7 (01.26.24), #8 (03.15.24), #9 (05.10.24), #10 (06.28.24), #11 (08.12.24), #12 (10.16.24), #13 (12.17.24), #14 (02.11.25) , #15 (04.11.25), #16 (05.13.25), #17 (06.26.25), #18 (09.18.25), #19 (10.30.25) - s/p IVA OD #1 (11.15.2), #2 (12.22.23), #3 (01.26.24), #4 (03.15.24), #5 (05.10.24), #6 (06.28.24), #7 (12.17.24), #8 (02.11.25), #9 (02.11.25), #10 (04.11.25), #11 (05.13.25), #12 (06.26.25), #13 (09.18.25), #14 (10.30.25) - s/p PRP OD (04.22.24) - new VH OD noted on 11.14.23 visit  - FA (11.14.23) OD: Enlarged FAZ, scattered patches of vascular non-perfusion greatest temporal periphery, mild perivascular leakage temporal periphery and from MA, focal early NVE superonasal midzone; OS: Mild enlargement of FAZ, scattered patches of vascular non-perfusion greatest temporal periphery, mild perivascular leakage temporal periphery and from MA; no NV -- pt would benefit from PRP OD -- completed 04.22.24 - FA (10.30.25) OD: Enlarged FAZ, scattered patches of vascular  non-perfusion--greatest temporal midzone, mild perivascular leakage 360 periphery--improved, focal NVE off superonasal arcades--resolved s/p PRP, OS: Mild interval enlargement of FAZ, scattered patches of vascular non-perfusion greatest temporal midzone, mild perivascular leakage temporal macula and from MA, no NV - BCVA OD 20/150-stable (severe retinal ischemia + cataract); OS 20/20 stable - OCT shows OD: central ellipsoid signal disruptions and thinning; vitreous opacities; irregular lamination, mild scattered IRHM, trace cystic changes--stably improved; OS: Stable improvement in vitreous opacities / VH, cystic changes and IRHM--slightly improved temporal fovea, focal IRA temporal macula at 6 wks - recommend IVA OU (OD #15 and OS #20) today, 12.12.25 w/ f/u in 6 wks - pt in agreement - RBA of procedure discussed, questions answered - IVA informed consent obtained and signed, 11.16.23 (OU) - see procedure note - f/u 6 weeks - DFE, OCT, possible injxns  5-7. Hypertensive retinopathy w/ retinal ischemia OU - BP uncontrolled at initial consult -- 170s / 110-120s in office 12.23.22, BP was significantly improved to 147/80 at f/u visit - FA 12.23.22 shows significant retinal ischemia OU -- enlarged FAZ OU (OD> OS) and significant patches of capillary drop out OU (OD>OS)  8. Mixed Cataract OU - The symptoms of cataract, surgical options, and treatments and risks were discussed with patient. - discussed diagnosis and progression - visually significant   - referred [facility] / Dr. Marcey for cat eval and establishment of primary eye care -- [facility]  Ophthalmic Meds Ordered this visit:  Meds ordered this encounter  Medications   Bevacizumab  (AVASTIN ) SOLN 1.25 mg   Bevacizumab  (AVASTIN ) SOLN 1.25 mg     Return in about 6 weeks (around 07/24/2024) for f/u, PDR, DFE,  OCT, Possible, IVA, OU.  This document serves as a record of services personally  performed by Redell JUDITHANN Hans, MD, PhD. It was created on their behalf by Delon Newness COT, an ophthalmic technician. The creation of this record is the provider's dictation and/or activities during the visit.    Electronically signed by: Delon Newness COT 12.09.25 2:39 AM  This document serves as a record of services personally performed by Redell JUDITHANN Hans, MD, PhD. It was created on their behalf by Wanda GEANNIE Keens, COT an ophthalmic technician. The creation of this record is the provider's dictation and/or activities during the visit.    Electronically signed by:  Wanda GEANNIE Keens, COT  06/15/2024 2:39 AM  Redell JUDITHANN Hans, M.D., Ph.D. Diseases & Surgery of the Retina and Vitreous [facility]  I have reviewed the above documentation for accuracy and completeness, and I agree with the above. Redell JUDITHANN Hans, M.D., Ph.D. 06/15/2024 2:41 AM   Abbreviations: M myopia (nearsighted); A astigmatism; H hyperopia (farsighted); P presbyopia; Mrx spectacle prescription;  CTL contact lenses; OD right eye; OS left eye; OU both eyes  XT exotropia; ET esotropia; PEK punctate epithelial keratitis; PEE punctate epithelial erosions; DES dry eye syndrome; MGD meibomian gland dysfunction; ATs artificial tears; PFAT's preservative free artificial tears; NSC nuclear sclerotic cataract; PSC posterior subcapsular cataract; ERM epi-retinal membrane; PVD posterior vitreous detachment; RD retinal detachment; DM diabetes mellitus; DR diabetic retinopathy; NPDR non-proliferative diabetic retinopathy; PDR proliferative diabetic retinopathy; CSME clinically significant macular edema; DME diabetic macular edema; dbh dot blot hemorrhages; CWS cotton wool spot; POAG primary open angle glaucoma; C/D cup-to-disc ratio; HVF humphrey visual field; GVF goldmann visual field; OCT optical coherence tomography; IOP intraocular pressure; BRVO Branch retinal vein occlusion; CRVO central retinal vein  occlusion; CRAO central retinal artery occlusion; BRAO branch retinal artery occlusion; RT retinal tear; SB scleral buckle; PPV pars plana vitrectomy; VH Vitreous hemorrhage; PRP panretinal laser photocoagulation; IVK intravitreal kenalog; VMT vitreomacular traction; MH Macular hole;  NVD neovascularization of the disc; NVE neovascularization elsewhere; AREDS age related eye disease study; ARMD age related macular degeneration; POAG primary open angle glaucoma; EBMD epithelial/anterior basement membrane dystrophy; ACIOL anterior chamber intraocular lens; IOL intraocular lens; PCIOL posterior chamber intraocular lens; Phaco/IOL phacoemulsification with intraocular lens placement; PRK photorefractive keratectomy; LASIK laser assisted in situ keratomileusis; HTN hypertension; DM diabetes mellitus; COPD chronic obstructive pulmonary disease

## 2024-06-10 ENCOUNTER — Inpatient Hospital Stay: Admit: 2024-06-10 | Payer: 59

## 2024-06-11 ENCOUNTER — Encounter (HOSPITAL_BASED_OUTPATIENT_CLINIC_OR_DEPARTMENT_OTHER): Admitting: Internal Medicine

## 2024-06-11 DIAGNOSIS — L089 Local infection of the skin and subcutaneous tissue, unspecified: Secondary | ICD-10-CM | POA: Diagnosis not present

## 2024-06-11 DIAGNOSIS — E11621 Type 2 diabetes mellitus with foot ulcer: Secondary | ICD-10-CM | POA: Diagnosis not present

## 2024-06-11 DIAGNOSIS — L97522 Non-pressure chronic ulcer of other part of left foot with fat layer exposed: Secondary | ICD-10-CM | POA: Diagnosis not present

## 2024-06-11 NOTE — Telephone Encounter (Addendum)
"  He feels he is retaining fluid.Last few days his feet are swollen and he is more SOB.Wt.is down a few pounds.He has been taking Lasix  40mg  qday.He is asking if he can take extra Lasix  20 or 40 mg for a few days?  "

## 2024-06-11 NOTE — Telephone Encounter (Signed)
"  Pt stated having swelling in feet and ankles, slight SOB. Said dr could look at his BP readings from PT yesterday 06/10/24. Did not give any other details.  Please advise.  "

## 2024-06-11 NOTE — Telephone Encounter (Signed)
"  [name] Elsie KANDICE DOUGLAS, MD to Me (Selected Message)        06/11/24  2:11 PM  Yes, have him due BID x 3 days and then go back to daily.   Thanks  Wgw    I  called and informed pt.of MD response and he v/u.  "

## 2024-06-12 ENCOUNTER — Encounter (INDEPENDENT_AMBULATORY_CARE_PROVIDER_SITE_OTHER): Payer: Self-pay | Admitting: Ophthalmology

## 2024-06-12 ENCOUNTER — Encounter (HOSPITAL_BASED_OUTPATIENT_CLINIC_OR_DEPARTMENT_OTHER): Admitting: Internal Medicine

## 2024-06-12 ENCOUNTER — Ambulatory Visit (INDEPENDENT_AMBULATORY_CARE_PROVIDER_SITE_OTHER): Admitting: Ophthalmology

## 2024-06-12 DIAGNOSIS — H25813 Combined forms of age-related cataract, bilateral: Secondary | ICD-10-CM

## 2024-06-12 DIAGNOSIS — E113412 Type 2 diabetes mellitus with severe nonproliferative diabetic retinopathy with macular edema, left eye: Secondary | ICD-10-CM

## 2024-06-12 DIAGNOSIS — I10 Essential (primary) hypertension: Secondary | ICD-10-CM | POA: Diagnosis not present

## 2024-06-12 DIAGNOSIS — H35033 Hypertensive retinopathy, bilateral: Secondary | ICD-10-CM

## 2024-06-12 DIAGNOSIS — H4313 Vitreous hemorrhage, bilateral: Secondary | ICD-10-CM | POA: Diagnosis not present

## 2024-06-12 DIAGNOSIS — Z7985 Long-term (current) use of injectable non-insulin antidiabetic drugs: Secondary | ICD-10-CM

## 2024-06-12 DIAGNOSIS — H3582 Retinal ischemia: Secondary | ICD-10-CM | POA: Diagnosis not present

## 2024-06-12 DIAGNOSIS — E113511 Type 2 diabetes mellitus with proliferative diabetic retinopathy with macular edema, right eye: Secondary | ICD-10-CM | POA: Diagnosis not present

## 2024-06-12 MED ORDER — BEVACIZUMAB CHEMO INJECTION 1.25MG/0.05ML SYRINGE FOR KALEIDOSCOPE
1.2500 mg | INTRAVITREAL | Status: AC | PRN
  Administered 2024-06-12: 1.25 mg via INTRAVITREAL

## 2024-06-15 ENCOUNTER — Inpatient Hospital Stay: Payer: 59

## 2024-06-22 ENCOUNTER — Inpatient Hospital Stay: Payer: 59

## 2024-06-22 NOTE — Flowsheet Note (Signed)
 "   06/22/24 0739   Treatment Diagnosis   Treatment Diagnosis 1 NSTEMI   NSTEMI Date 04/02/24   Treatment Diagnosis 2 PCI  (to mLAD)   PCI Date 04/02/24   Referral Date 04/15/24   Significant Cardiovascular History History of heart failure  (HTN, HLD)   Co-morbidities Diabetes;Renal disease  (OSA, anxiety, PTSD, AKI)   Individual Treatment Plan   ITP Visit Type Re-assessment   1st Date of Exercise  05/25/24   ITP Next Review Date 07/21/24   Visit #/Total Visits 4  (Limited sessions due to travel distance and toe wound)   EF% 50 %  (50-55% via echo 04/02/24)   Risk Stratification High   Supplemental Oxygen   Oxygen Use No  (cpap at night occassioanlly)   Exercise Assessment   Stages of Change Action  (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)   Assisted Devices Cane   Exercise Intervention/Prescription   Mode Stepper;Ergometer;Bike  (Non weightbearing due to L great toe wound and now amputation as of 06/16/24)   Frequency per week 3   Duration Per Session 40   Intensity METS       2.8   Target RPE 11-13   Progression Goal met, progress to 45 minutes at 2.9 to 3.1 METs as surgery recovery allows   Symptoms with Exercise Other (comment)  (L great toe wound and now amputation)   Target Heart Rate RHR+30 bpm   Resistance Training No  (evaluate in next 30 days)   Exercise Activity at Home   Type none at present time   Exercise Education   Education Deep breathing techniques;Dyspnea management;Exercise safety;Physically active;RPE scale;Signs/symptoms to report;Warm up/cool down   Exercise Goals   Patient Target Goals Aerobic activity 30 + minutes/day  5 days/week;Decrease dyspnea (exercise);Improve endurance;Individual exercise RX;Maintain exercise and activity at a moderate intensity   Patient Treatment Goal To gain more energy   Goal Status In progress   Progress Towards Goal increased exercise time until toe amputation   Psychosocial Assessment   Stages of Change Preparation   Psychosocial Intervention   Interventions  Currently under treatment for depression  (Currently going to [place] for treatment PTSD)   Stress Management Techniques Connects with others   Resources Provided Other (comment)  (Cardiac rehab eduacational manual)   Currently Taking Psychotropic Meds Yes  (ambien 10mg  daily)   Medication Changes No   Psychosocial Education   Education Advanced directives   Psychosocial Goals   Patient Target Goals Assess presence or absence of depression using a valid screening tool;Demonstrates appropriate interaction with others;Engages in self-care behaviors;Maximizes coping skills;Positive support group   Patient Treatment Goal To have PHQ9 score less than 10 by discharge   Goal Status In progress   Progress Towards Goal encouraging attendance at stress management class   Other Core Component - Tobacco Cessation   History of Tobacco/E-Cigarette/Chewing Tobacco/Other yes  (smokes cigar)   Current/Recent Use <6 mo. - Managed as Core Component Yes   Tobacco Cessation Assessment   Stages of Change Contemplate   Type Cigar  (Cloves)   Tobacco Use Status   (says uses clove cigar)   # Cigarette Smoked/Day 5 cloves per day, down from 8-9   Tobacco Cessation Intervention   Smoking Cessation Referral No  (patient declines)   Tobacco Cessation Education   Resource Information Provided No  (patient declines)   Tobacco Triggers PTSD   Tobacco Cessation Goals   Patient Target Goals Reduced tobacco use without a target quit date  Patient Treatment Goal To quit smoking cigars   Goal Status In progress   Progress Towards Goal Patient not currently willing to set a quit date, down from 8-9 cloves per day to 5   Nutrition Assessment   Stages of Change Contemplate   Nutrition Assessment Tool Rate Your Plate   Current Diet eat alot of baked chicken, vegetables. yogurt, banan, bacon egg.  Eats alot of nuts, oranges applesauce, has cut fried foods   Barriers to Heart Healthy Diet None idenitfied   Alcohol No   Height  1.753 m (5' 9)   Weight  87.1 kg  (192 lb)   BMI 28.41   Waist Circumference (In) 39 inches   Other Core Component - Weight Management   BMI <18.5 or > 24.9, Managed as Core Component No   Other Core Component - Diabetes   Diabetes Yes   Diabetes Management Assessment   Stages of Change Contemplate   FBS 84-110   HgbA1c 5.9   HgbA1c Date 04/02/24   BG at Home Yes  (daily)   BG Frequency once a day   Continuous Glucose Monitoring No   BG in Range Yes   Diabetes Management Interventions   Diabetes Med(s) ozempic 1mg    Diabetes Med(s) Change No   Demonstrates Medication Compliance Yes   Diabetes Management Education   Resource Information Provided Yes   Diabetes Education Referral No  (declines for now)   Diabetes Management Goals   Patient Target Goals HgA1c<7%   Patient Treatment Goal To keep A1c under 7%   Goal Status In progress   Progress Towards Goal Current A1c at 5.9 at entry   Other Core Component - Lipid Management   Lipids Managed as Core Component No   Not Managed as a Core Component   Date Lipids Drawn   (none available)   Lipid Med(s) Lipitor  40mg  daily   Lipid Med Change(s) No   Nutrition Intervention   Dietitian Consult Yes  (patient interested in classes)   Nutrition Education   Resource Information Provided Yes  (nutiriton class)   Nutrition Goals   Patient Target Goals   (weight gain)   Patient Treatment Goal To not drop below 200 lbs, with good body composition   Goal Status In progress   Progress Towards Goal Current weight 192   Education   Learning Barrier Ready to learn   Education Schedule Given Yes   Other Core Component - Hypertension   Hypertension Yes   Is BP WDL Yes  (Recent BP at rehab 134/82)   Hypertension Managed as Core Component No   Medications   Resource Information Provided Yes  (cardiac rehab educational manual)   Med(s) Change No   BP Meds carvedilol 12.5mg  BID; valsartan  160mg  daily   ACE/ARB/ARNI Prescribed Yes   ACE/ARB/ARNI Adherent Yes   BB Prescribed Yes   BB Adherent Yes   Antiplatelet/Anticoagulant  Prescribed Yes  (ASA,  Brilinta )   Antiplatelet/Anticoagulant Adherent Yes   Statins/Anti-hyperlipidemic Prescribed Yes   Statins/Anti-hyperlipidemic Adherent Yes   Statins/Anti-hyperlipidemic Intensity High  (Lipitor  40mg  daily)   Other Core Component - Medication Compliance   Medication Compliance Managed as Core Component No   Other Core Component - Other Risk Factor   Other Modifiable Risk Factor Managed as Core Component No     "

## 2024-06-23 ENCOUNTER — Inpatient Hospital Stay: Admit: 2024-06-23 | Payer: 59

## 2024-06-24 ENCOUNTER — Inpatient Hospital Stay: Admit: 2024-06-24 | Payer: 59

## 2024-06-24 DIAGNOSIS — I2510 Atherosclerotic heart disease of native coronary artery without angina pectoris: Principal | ICD-10-CM

## 2024-06-29 ENCOUNTER — Inpatient Hospital Stay: Payer: 59

## 2024-06-29 ENCOUNTER — Encounter (HOSPITAL_BASED_OUTPATIENT_CLINIC_OR_DEPARTMENT_OTHER): Admitting: Internal Medicine

## 2024-06-30 ENCOUNTER — Inpatient Hospital Stay: Admit: 2024-06-30 | Payer: 59

## 2024-07-01 ENCOUNTER — Inpatient Hospital Stay: Payer: 59

## 2024-07-06 ENCOUNTER — Inpatient Hospital Stay: Payer: 59

## 2024-07-06 DIAGNOSIS — Z9861 Coronary angioplasty status: Principal | ICD-10-CM

## 2024-07-07 ENCOUNTER — Inpatient Hospital Stay: Admit: 2024-07-07 | Payer: 59

## 2024-07-08 ENCOUNTER — Inpatient Hospital Stay: Admit: 2024-07-08 | Payer: 59

## 2024-07-08 NOTE — Telephone Encounter (Signed)
 Patient contacted, , forms completed. Advised they are ready to pick up at front desk.

## 2024-07-08 NOTE — Telephone Encounter (Signed)
 Pt came into the office to drop off paper work for Dr. Gerard, once the papers are filled out, pt would to be notified so he can pick them up.     Left in the mailbox.

## 2024-07-09 ENCOUNTER — Inpatient Hospital Stay: Payer: 59

## 2024-07-13 ENCOUNTER — Inpatient Hospital Stay: Payer: 59

## 2024-07-14 ENCOUNTER — Inpatient Hospital Stay: Admit: 2024-07-14 | Payer: 59

## 2024-07-14 DIAGNOSIS — Z9861 Coronary angioplasty status: Principal | ICD-10-CM

## 2024-07-15 ENCOUNTER — Inpatient Hospital Stay: Admit: 2024-07-15 | Payer: 59

## 2024-07-16 ENCOUNTER — Inpatient Hospital Stay: Payer: 59

## 2024-07-17 ENCOUNTER — Inpatient Hospital Stay: Admit: 2024-07-17 | Payer: 59

## 2024-07-20 ENCOUNTER — Inpatient Hospital Stay: Admit: 2024-07-20 | Payer: 59

## 2024-07-20 NOTE — Progress Notes (Signed)
 [facility] - Clinic Note  07/24/2024     CHIEF COMPLAINT Patient presents for Retina Follow Up   HISTORY OF PRESENT ILLNESS: [name] is a 59 y.o. male who presents to the clinic today for:   HPI     Retina Follow Up   Patient presents with  Diabetic Retinopathy.  In both eyes.  This started 6 weeks ago.  Duration of 6 weeks.  Since onset it is stable.  I, the attending physician,  performed the HPI with the patient and updated documentation appropriately.        Comments   Pt denies any changes in vision. Pt states he can tell medication begins to wear off, vision become more hazy at week 5 and 6. BS=not monitored, new cell -needs to get app working. A1c=6.5,  2 months ago.       Last edited by Valdemar Rogue, MD on 07/24/2024  4:07 PM.     Patient states the vision is the same.   Referring physician: Clinic, [facility] [address] [facility] [place],  [place] [address]  HISTORICAL INFORMATION:   Selected notes from the MEDICAL RECORD NUMBER Referred by [facility] for retinal hemes LEE:  Ocular Hx- PMH-    CURRENT MEDICATIONS: No current outpatient medications on file. (Ophthalmic Drugs)   No current facility-administered medications for this visit. (Ophthalmic Drugs)   Current Outpatient Medications (Other)  Medication Sig   aspirin  EC 81 MG tablet Take 1 tablet (81 mg total) by mouth daily. Swallow whole.   atorvastatin  (LIPITOR) 40 MG tablet Take 1 tablet (40 mg total) by mouth at bedtime.   carvedilol  (COREG ) 12.5 MG tablet Take 1 tablet (12.5 mg total) by mouth 2 (two) times daily with a meal.   doxycycline  (VIBRAMYCIN ) 100 MG capsule Take 1 capsule (100 mg total) by mouth 2 (two) times daily.   furosemide  (LASIX ) 20 MG tablet Take 1 tablet (20 mg total) by mouth daily.   gabapentin  (NEURONTIN ) 800 MG tablet Take 800 mg by mouth QID.   nitroGLYCERIN  (NITROSTAT ) 0.4 MG SL tablet Place 1 tablet (0.4 mg total) under the tongue  every 5 (five) minutes as needed for chest pain.   omeprazole (PRILOSEC) 40 MG capsule Take 40 mg by mouth daily.   oxyCODONE -acetaminophen  (PERCOCET) 10-325 MG tablet Take 1 tablet by mouth every 4 (four) hours as needed for pain. 1 q 6 hours and 1 at bedtime   ticagrelor  (BRILINTA ) 90 MG TABS tablet Take 1 tablet (90 mg total) by mouth 2 (two) times daily.   valsartan  (DIOVAN ) 160 MG tablet Take 1 tablet (160 mg total) by mouth daily.   No current facility-administered medications for this visit. (Other)   REVIEW OF SYSTEMS: ROS   Positive for: Endocrine, Eyes Negative for: Constitutional, Gastrointestinal, Neurological, Skin, Genitourinary, Musculoskeletal, HENT, Cardiovascular, Respiratory, Psychiatric, Allergic/Imm, Heme/Lymph Last edited by Elnor Avelina RAMAN, COT on 07/24/2024 12:52 PM.     ALLERGIES Allergies  Allergen Reactions   Lisinopril Other (See Comments) and Hives    Other reaction(s): Electrocardiogram abnormal  Other Reaction(s): Electrocardiogram abnormal, Abdominal pain, Electrocardiogram abnormal, Abdominal pain   PAST MEDICAL HISTORY Past Medical History:  Diagnosis Date   Arthritis    Diabetes mellitus    Fistula, perirectal    GERD (gastroesophageal reflux disease)    Hypertension    Osteomyelitis of great toe of right foot (HCC) 10/31/2021   Polymicrobial bacterial infection 10/31/2021   PTSD (post-traumatic stress disorder)    PTSD (  post-traumatic stress disorder) 10/31/2021   Small bowel obstruction (HCC)    Past Surgical History:  Procedure Laterality Date   RECTAL SURGERY     FAMILY HISTORY Family History  Problem Relation Age of Onset   Heart attack Father    Obesity Father    SOCIAL HISTORY Social History   Tobacco Use   Smoking status: Some Days    Types: Cigars   Smokeless tobacco: Never   Tobacco comments:    Occasional cigar  Vaping Use   Vaping status: Never Used  Substance Use Topics   Alcohol use: Yes    Comment: occ   Drug  use: No       OPHTHALMIC EXAM:  Base Eye Exam     Visual Acuity (Snellen - Linear)       Right Left   Dist cc 20/150 +2 20/25   Dist ph cc NI NI    Correction: Glasses         Tonometry (Tonopen, 12:57 PM)       Right Left   Pressure 13 9         Pupils       Pupils Dark Light Shape React APD   Right PERRL 3 2 Round Brisk None   Left PERRL 3 2 Round Brisk None         Visual Fields       Left Right    Full Full         Extraocular Movement       Right Left    Full, Ortho Full, Ortho         Neuro/Psych     Oriented x3: Yes   Mood/Affect: Normal         Dilation     Both eyes: 1.0% Mydriacyl, 2.5% Phenylephrine @ 12:57 PM           Slit Lamp and Fundus Exam     Slit Lamp Exam       Right Left   Lids/Lashes Dermatochalasis - upper lid Dermatochalasis - upper lid   Conjunctiva/Sclera mild melanosis, nasal pingeucula mild melanosis, nasal and temporal pinguecula   Cornea arcus, 2+ Punctate epithelial erosions arcus, 2+inferior PEE, mild tear film debris   Anterior Chamber deep, clear, narrow temporal angle Deep and quiet   Iris Round and dilated, No NVI Round and dilated, No NVI   Lens 3+ Nuclear sclerosis with brunescence, 2-3+ Cortical cataract 3+ Nuclear sclerosis with brunescence, 3+ Cortical cataract   Anterior Vitreous Vitreous syneresis, blood stained vitreous condensations essentially cleared Vitreous syneresis, trace white vit condensations inferiorly--improving         Fundus Exam       Right Left   Disc Pink and Sharp, no NVD, Compact Pink and Sharp   C/D Ratio 0.2 0.2   Macula Flat, good foveal reflex, RPE mottling and clumping, scattered MA / DBH, trace cystic changes Flat, Good foveal reflex, scattered MA and CWS   Vessels attenuated, Tortuous attenuated, Tortuous   Periphery Attached, scattered DBH and CWS posteriorly, light PRP changes 360 Attached, scattered DBH and CWS posteriorly           Refraction      Wearing Rx       Sphere Cylinder Axis Add   Right +0.75 Sphere  +2.50   Left +1.75 +0.50 170 +2.50           IMAGING AND PROCEDURES  Imaging and Procedures for 07/24/2024  OCT, Retina -  OU - Both Eyes       Right Eye Quality was good. Central Foveal Thickness: 219. Progression has been stable. Findings include normal foveal contour, no IRF, no SRF, outer retinal atrophy, vitreomacular adhesion (central ellipsoid signal disruptions and thinning; vitreous opacities; irregular lamination, mild scattered IRHM, trace cystic changes--stably improved).   Left Eye Quality was good. Central Foveal Thickness: 287. Progression has been stable. Findings include normal foveal contour, no SRF, intraretinal hyper-reflective material, intraretinal fluid (Stable improvement in vitreous opacities / VH, persistent cystic changes and IRHM temporal fovea, focal IRA temporal macula).   Notes *Images captured and stored on drive  Diagnosis / Impression:  OD: central ellipsoid signal disruptions and thinning; vitreous opacities; irregular lamination, mild scattered IRHM, trace cystic changes--stably improved OS: Stable improvement in vitreous opacities / VH, persistent cystic changes and IRHM temporal fovea, focal IRA temporal macula  Clinical management:  See below  Abbreviations: NFP - Normal foveal profile. CME - cystoid macular edema. PED - pigment epithelial detachment. IRF - intraretinal fluid. SRF - subretinal fluid. EZ - ellipsoid zone. ERM - epiretinal membrane. ORA - outer retinal atrophy. ORT - outer retinal tubulation. SRHM - subretinal hyper-reflective material. IRHM - intraretinal hyper-reflective material      Intravitreal Injection, Pharmacologic Agent - OD - Right Eye       Time Out 07/24/2024. 1:27 PM. Confirmed correct patient, procedure, site, and patient consented.   Anesthesia Topical anesthesia was used. Anesthetic medications included Lidocaine  2%, Proparacaine 0.5%.    Procedure Preparation included 5% betadine to ocular surface, eyelid speculum. A supplied (32g) needle was used.   Injection: 1.25 mg Bevacizumab  1.25mg /0.56ml   Route: Intravitreal, Site: Right Eye   NDC: H525437, Lot: [id number], Expiration date: 08/20/2024   Post-op Post injection exam found visual acuity of at least counting fingers. The patient tolerated the procedure well. There were no complications. The patient received written and verbal post procedure care education.      Intravitreal Injection, Pharmacologic Agent - OS - Left Eye       Time Out 07/24/2024. 1:27 PM. Confirmed correct patient, procedure, site, and patient consented.   Anesthesia Topical anesthesia was used. Anesthetic medications included Lidocaine  2%, Proparacaine 0.5%.   Procedure Preparation included 5% betadine to ocular surface, eyelid speculum. A supplied (33g) needle was used.   Injection: 1.25 mg Bevacizumab  1.25mg /0.72ml   Route: Intravitreal, Site: Left Eye   NDC: H525437, Lot: [id number]$$[id number]$ , Expiration date: 08/24/2024   Post-op Post injection exam found visual acuity of at least counting fingers. The patient tolerated the procedure well. There were no complications. The patient received written and verbal post procedure care education. Post injection medications were not given.            ASSESSMENT/PLAN:    ICD-10-CM   1. 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 Vitreous hemorrhage of both eyes (HCC)  H43.13     3. Severe nonproliferative diabetic retinopathy of left eye with macular edema associated with type 2 diabetes mellitus (HCC)  E11.3412 OCT, Retina - OU - Both Eyes    Intravitreal Injection, Pharmacologic Agent - OS - Left Eye    Bevacizumab  (AVASTIN )  SOLN 1.25 mg    4. Long-term (current) use of injectable non-insulin  antidiabetic drugs  Z79.85     5. Essential hypertension  I10     6. Hypertensive retinopathy of both eyes  H35.033     7. Retinal ischemia  H35.82     8. Combined forms of age-related cataract of both eyes  H25.813      1-4.  Proliferative diabetic retinopathy OD, Severe non-proliferative diabetic retinopathy, OS  ** New VH OS noted 04.11.25 exam**  - new VH OD noted on 11.14.23 visit  - pt lost to follow up from 06.16.23 to 11.14.23  - delayed f/u - 12 wks instead of 6 on 09.18.25  - A1c: 8.4 on 03.28.24 - pt reports recent improvement in A1c from 11 down to 6 since starting Ozempic - BCVA OD 20/150-stable (severe retinal ischemia + cataract); OS 20/20 stable - s/p IVA OS #1 (03.22.23), #2 (04.21.23), #3 (05.19.23), #4 (06.16.23), #5 (11.15.23), #6 (12.22.24), # 7 (01.26.24), #8 (03.15.24), #9 (05.10.24), #10 (06.28.24), #11 (08.12.24), #12 (10.16.24), #13 (12.17.24), #14 (02.11.25), #15 (04.11.25), #16 (05.13.25), #17 (06.26.25), #18 (09.18.25), #19 (10.30.25), #20 (12.12.25) - s/p IVA OD #1 (11.15.2), #2 (12.22.23), #3 (01.26.24), #4 (03.15.24), #5 (05.10.24), #6 (06.28.24), #7 (12.17.24), #8 (02.11.25), #9 (02.11.25), #10 (04.11.25), #11 (05.13.25), #12 (06.26.25), #13 (09.18.25), #14 (10.30.25) #15 (12.12.25) - s/p PRP OD (04.22.24) - FA (11.14.23) OD: Enlarged FAZ, scattered patches of vascular non-perfusion greatest temporal periphery, mild perivascular leakage temporal periphery and from MA, focal early NVE superonasal midzone; OS: Mild enlargement of FAZ, scattered patches of vascular non-perfusion greatest temporal periphery, mild perivascular leakage temporal periphery and from MA; no NV -- pt would benefit from PRP OD -- completed 04.22.24 - FA (10.30.25) OD: Enlarged FAZ, scattered patches of vascular non-perfusion--greatest temporal midzone, mild perivascular leakage 360 periphery--improved, focal NVE off  superonasal arcades--resolved s/p PRP, OS: Mild interval enlargement of FAZ, scattered patches of vascular non-perfusion greatest temporal midzone, mild perivascular leakage temporal macula and from MA, no NV - BCVA OD 20/150 - stable; OS 20/25 from 20/20 - OCT shows OD: central ellipsoid signal disruptions and thinning; vitreous opacities; irregular lamination, mild scattered IRHM, trace cystic changes--stably improved; OS: Stable improvement in vitreous opacities / VH, persistent cystic changes and IRHM temporal fovea, focal IRA temporal macula at 6 wks - recommend IVA OU (OD #16 and OS #21) today, 01.23.26 w/ f/u in 6 wks - pt in agreement - RBA of procedure discussed, questions answered - IVA informed consent obtained and signed, 01.23.26 (OU) - see procedure note - f/u 6 weeks - DFE, OCT, possible injxns  5-7. Hypertensive retinopathy w/ retinal ischemia OU - BP uncontrolled at initial consult -- 170s / 110-120s in office 12.23.22, BP was significantly improved to 147/80 at f/u visit - FA 12.23.22 shows significant retinal ischemia OU -- enlarged FAZ OU (OD> OS) and significant patches of capillary drop out OU (OD>OS)  8. Mixed Cataract OU - The symptoms of cataract, surgical options, and treatments and risks were discussed with patient. - discussed diagnosis and progression - visually significant  - referred to [facility] / Dr. Marcey for cat eval and establishment of primary eye care -- [facility]  Ophthalmic Meds Ordered this visit:  Meds ordered this encounter  Medications   Bevacizumab  (AVASTIN ) SOLN 1.25 mg   Bevacizumab  (AVASTIN ) SOLN 1.25 mg     Return in about 6 weeks (around 09/04/2024) for f/u, PDR, DFE, OCT, Possible, IVA, OU.  This document serves as a record of services personally performed by  Redell JUDITHANN Hans, MD, PhD. It was created on their behalf by Delon Newness COT, an ophthalmic technician. The creation of this record  is the provider's dictation and/or activities during the visit.    Electronically signed by: Delon Newness COT 01.19.26 8:22 PM  This document serves as a record of services personally performed by Redell JUDITHANN Hans, MD, PhD. It was created on their behalf by Wanda GEANNIE Keens, COT an ophthalmic technician. The creation of this record is the provider's dictation and/or activities during the visit.    Electronically signed by:  Wanda GEANNIE Keens, COT  07/30/24 8:22 PM  Redell JUDITHANN Hans, M.D., Ph.D. Diseases & Surgery of the Retina and Vitreous [facility] 07/24/2024   I have reviewed the above documentation for accuracy and completeness, and I agree with the above. Redell JUDITHANN Hans, M.D., Ph.D. 07/30/24 8:32 PM   Abbreviations: M myopia (nearsighted); A astigmatism; H hyperopia (farsighted); P presbyopia; Mrx spectacle prescription;  CTL contact lenses; OD right eye; OS left eye; OU both eyes  XT exotropia; ET esotropia; PEK punctate epithelial keratitis; PEE punctate epithelial erosions; DES dry eye syndrome; MGD meibomian gland dysfunction; ATs artificial tears; PFAT's preservative free artificial tears; NSC nuclear sclerotic cataract; PSC posterior subcapsular cataract; ERM epi-retinal membrane; PVD posterior vitreous detachment; RD retinal detachment; DM diabetes mellitus; DR diabetic retinopathy; NPDR non-proliferative diabetic retinopathy; PDR proliferative diabetic retinopathy; CSME clinically significant macular edema; DME diabetic macular edema; dbh dot blot hemorrhages; CWS cotton wool spot; POAG primary open angle glaucoma; C/D cup-to-disc ratio; HVF humphrey visual field; GVF goldmann visual field; OCT optical coherence tomography; IOP intraocular pressure; BRVO Branch retinal vein occlusion; CRVO central retinal vein occlusion; CRAO central retinal artery occlusion; BRAO branch retinal artery occlusion; RT retinal tear; SB scleral buckle; PPV pars plana  vitrectomy; VH Vitreous hemorrhage; PRP panretinal laser photocoagulation; IVK intravitreal kenalog; VMT vitreomacular traction; MH Macular hole;  NVD neovascularization of the disc; NVE neovascularization elsewhere; AREDS age related eye disease study; ARMD age related macular degeneration; POAG primary open angle glaucoma; EBMD epithelial/anterior basement membrane dystrophy; ACIOL anterior chamber intraocular lens; IOL intraocular lens; PCIOL posterior chamber intraocular lens; Phaco/IOL phacoemulsification with intraocular lens placement; PRK photorefractive keratectomy; LASIK laser assisted in situ keratomileusis; HTN hypertension; DM diabetes mellitus; COPD chronic obstructive pulmonary disease

## 2024-07-21 ENCOUNTER — Inpatient Hospital Stay: Admit: 2024-07-21 | Payer: 59

## 2024-07-21 NOTE — Flowsheet Note (Signed)
 07/21/24 9192   Treatment Diagnosis   Treatment Diagnosis 1 NSTEMI   NSTEMI Date 04/02/24   Treatment Diagnosis 2 PCI  (to mLAD)   PCI Date 04/02/24   Referral Date 04/15/24   Significant Cardiovascular History History of heart failure  (HTN, HLD)   Co-morbidities Diabetes;Renal disease  (OSA, anxiety, PTSD, AKI)   Individual Treatment Plan   ITP Visit Type Re-assessment  (60 day)   1st Date of Exercise  05/25/24   ITP Next Review Date 08/19/24   Visit #/Total Visits 14   EF% 50 %  (50-55% via echo 04/02/24)   Risk Stratification High   Supplemental Oxygen   Oxygen Use No  (cpap at night occassioanlly)   Exercise Assessment   Stages of Change Action  (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)   Assisted Devices Cane  (Not using assistive device at rehab)   Exercise Intervention/Prescription   Mode Stepper;Ergometer;Track  (Patient now able to do some walking around the track post toe amputation)   Frequency per week 3   Duration Per Session 45   Intensity METS       3.0   Target RPE 11-13   Progression Goal met, progress to 50 minutes at 3.1 to 3.3 METs as surgery recovery allows   Symptoms with Exercise Other (comment)  (L great toe wound and now amputation)   Target Heart Rate RHR+30 bpm   Resistance Training Yes  (Started January 2026)   Exercise Activity at Home   Type none at present time   Exercise Education   Education Deep breathing techniques;Dyspnea management;Exercise safety;Physically active;RPE scale;Signs/symptoms to report;Warm up/cool down   Exercise Goals   Patient Target Goals Aerobic activity 30 + minutes/day  5 days/week;Decrease dyspnea (exercise);Improve endurance;Individual exercise RX;Maintain exercise and activity at a moderate intensity   Patient Treatment Goal To gain more energy   Goal Status In progress   Progress Towards Goal Patient has been able to increase time on machines and started walking track   Psychosocial Assessment   Stages of Change Action   Psychosocial Intervention    Interventions Currently under treatment for depression  (Currently going to [place] for treatment PTSD)   Stress Management Techniques Connects with others   Resources Provided Other (comment)  (Cardiac rehab eduacational manual)   Currently Taking Psychotropic Meds Yes  (ambien 10mg  daily)   Medication Changes No   Psychosocial Education   Education Advanced directives   Psychosocial Goals   Patient Target Goals Assess presence or absence of depression using a valid screening tool;Demonstrates appropriate interaction with others;Engages in self-care behaviors;Maximizes coping skills;Positive support group   Patient Treatment Goal To have PHQ9 score less than 10 by discharge   Goal Status In progress   Progress Towards Goal Encouraged attendance at upcoming stress management class in the afternoon   Other Core Component - Tobacco Cessation   History of Tobacco/E-Cigarette/Chewing Tobacco/Other Yes  (smokes cigar)   Current/Recent Use <6 mo. - Managed as Core Component Yes   Tobacco Cessation Assessment   Stages of Change Contemplate   Type Cigar  (Cloves)   Tobacco Use Status   (says uses clove cigar)   # Cigarette Smoked/Day 5 cloves per day, down from 8-9   Tobacco Cessation Intervention   Smoking Cessation Referral No  (patient declines)   Tobacco Cessation Education   Resource Information Provided No  (patient declines)   Tobacco Triggers PTSD   Tobacco Cessation Goals   Patient Target Goals Reduced tobacco use  without a target quit date   Patient Treatment Goal To quit smoking cigars   Goal Status In progress   Progress Towards Goal Patient unwilling to quit cloves   Nutrition Assessment   Stages of Change Contemplate   Nutrition Assessment Tool Rate Your Plate   Current Diet eat alot of baked chicken, vegetables. yogurt, banan, bacon egg.  Eats alot of nuts, oranges applesauce, has cut fried foods   Barriers to Heart Healthy Diet None idenitfied   Alcohol No   Height  1.753 m (5' 9)   Weight  88.8 kg (195 lb  12.8 oz)  (Patient trying to increase weight above 200lbs)   BMI 28.98   Waist Circumference (In) 39 inches   Other Core Component - Weight Management   BMI <18.5 or > 24.9, Managed as Core Component No   Other Core Component - Diabetes   Diabetes Yes   Diabetes Management Assessment   Stages of Change Contemplate   FBS 84-110   HgbA1c 5.9   HgbA1c Date 04/02/24   BG at Home Yes  (daily)   BG Frequency once a day   Continuous Glucose Monitoring No   BG in Range Yes   Diabetes Management Interventions   Diabetes Med(s) ozempic 1mg    Diabetes Med(s) Change No   Demonstrates Medication Compliance Yes   Diabetes Management Education   Resource Information Provided Yes   Diabetes Education Referral No  (declines for now)   Diabetes Management Goals   Patient Target Goals HgA1c<7%   Patient Treatment Goal To keep A1c under 7%   Goal Status In progress   Progress Towards Goal A1c at entry is 5.9   Other Core Component - Lipid Management   Lipids Managed as Core Component No   Not Managed as a Core Component   Date Lipids Drawn   (none available)   Lipid Med(s) Lipitor  40mg  daily   Lipid Med Change(s) No   Nutrition Intervention   Dietitian Consult Yes  (patient interested in classes)   Nutrition Education   Resource Information Provided Yes  (nutiriton class)   Nutrition Goals   Patient Target Goals   (weight gain)   Patient Treatment Goal To not drop below 200 lbs, with good body composition   Goal Status In progress   Progress Towards Goal Patient trying to gain weight, currently at 195.8lbs   Education   Learning Barrier Ready to learn   Education Schedule Given Yes   Other Core Component - Hypertension   Hypertension Yes   Is BP WDL Yes  (Recent BP at rehab 124/76)   Hypertension Managed as Core Component No   Medications   Resource Information Provided Yes  (cardiac rehab educational manual)   Med(s) Change No   BP Meds carvedilol 12.5mg  BID; valsartan  160mg  daily   ACE/ARB/ARNI Prescribed Yes   ACE/ARB/ARNI Adherent  Yes   BB Prescribed Yes   BB Adherent Yes   Antiplatelet/Anticoagulant Prescribed Yes  (ASA,  Brilinta )   Antiplatelet/Anticoagulant Adherent Yes   Statins/Anti-hyperlipidemic Prescribed Yes   Statins/Anti-hyperlipidemic Adherent Yes   Statins/Anti-hyperlipidemic Intensity High  (Lipitor  40mg  daily)   Other Core Component - Medication Compliance   Medication Compliance Managed as Core Component No   Other Core Component - Other Risk Factor   Other Modifiable Risk Factor Managed as Core Component No

## 2024-07-22 ENCOUNTER — Inpatient Hospital Stay: Admit: 2024-07-22 | Payer: 59

## 2024-07-24 ENCOUNTER — Ambulatory Visit (INDEPENDENT_AMBULATORY_CARE_PROVIDER_SITE_OTHER): Admitting: Ophthalmology

## 2024-07-24 ENCOUNTER — Encounter (INDEPENDENT_AMBULATORY_CARE_PROVIDER_SITE_OTHER): Payer: Self-pay | Admitting: Ophthalmology

## 2024-07-24 DIAGNOSIS — H3582 Retinal ischemia: Secondary | ICD-10-CM | POA: Diagnosis not present

## 2024-07-24 DIAGNOSIS — H35033 Hypertensive retinopathy, bilateral: Secondary | ICD-10-CM | POA: Diagnosis not present

## 2024-07-24 DIAGNOSIS — H25813 Combined forms of age-related cataract, bilateral: Secondary | ICD-10-CM | POA: Diagnosis not present

## 2024-07-24 DIAGNOSIS — E113511 Type 2 diabetes mellitus with proliferative diabetic retinopathy with macular edema, right eye: Secondary | ICD-10-CM | POA: Diagnosis not present

## 2024-07-24 DIAGNOSIS — E113412 Type 2 diabetes mellitus with severe nonproliferative diabetic retinopathy with macular edema, left eye: Secondary | ICD-10-CM | POA: Diagnosis not present

## 2024-07-24 DIAGNOSIS — H4313 Vitreous hemorrhage, bilateral: Secondary | ICD-10-CM | POA: Diagnosis not present

## 2024-07-24 DIAGNOSIS — Z7985 Long-term (current) use of injectable non-insulin antidiabetic drugs: Secondary | ICD-10-CM

## 2024-07-24 DIAGNOSIS — I10 Essential (primary) hypertension: Secondary | ICD-10-CM | POA: Diagnosis not present

## 2024-07-24 MED ORDER — BEVACIZUMAB CHEMO INJECTION 1.25MG/0.05ML SYRINGE FOR KALEIDOSCOPE
1.2500 mg | INTRAVITREAL | Status: AC | PRN
  Administered 2024-07-24: 1.25 mg via INTRAVITREAL

## 2024-07-29 ENCOUNTER — Inpatient Hospital Stay: Payer: 59

## 2024-08-03 ENCOUNTER — Inpatient Hospital Stay: Payer: 59

## 2024-08-05 ENCOUNTER — Inpatient Hospital Stay: Admit: 2024-08-05 | Payer: 59

## 2024-08-06 ENCOUNTER — Inpatient Hospital Stay: Admit: 2024-08-06 | Payer: 59

## 2024-09-04 ENCOUNTER — Encounter (INDEPENDENT_AMBULATORY_CARE_PROVIDER_SITE_OTHER): Admitting: Ophthalmology
